# Patient Record
Sex: Male | Born: 1935 | Race: White | Hispanic: No | Smoking: Never smoker
Health system: Southern US, Community
[De-identification: ages and names within clinical notes are randomized; demographics above are authoritative.]

## PROBLEM LIST (undated history)

## (undated) DIAGNOSIS — K219 Gastro-esophageal reflux disease without esophagitis: Secondary | ICD-10-CM

## (undated) DIAGNOSIS — I440 Atrioventricular block, first degree: Secondary | ICD-10-CM

## (undated) DIAGNOSIS — I739 Peripheral vascular disease, unspecified: Secondary | ICD-10-CM

## (undated) DIAGNOSIS — I25709 Atherosclerosis of coronary artery bypass graft(s), unspecified, with unspecified angina pectoris: Secondary | ICD-10-CM

## (undated) DIAGNOSIS — I639 Cerebral infarction, unspecified: Secondary | ICD-10-CM

## (undated) DIAGNOSIS — I2510 Atherosclerotic heart disease of native coronary artery without angina pectoris: Secondary | ICD-10-CM

## (undated) DIAGNOSIS — M1990 Unspecified osteoarthritis, unspecified site: Secondary | ICD-10-CM

## (undated) DIAGNOSIS — I472 Ventricular tachycardia: Secondary | ICD-10-CM

## (undated) DIAGNOSIS — I4729 Other ventricular tachycardia: Secondary | ICD-10-CM

## (undated) DIAGNOSIS — Z955 Presence of coronary angioplasty implant and graft: Secondary | ICD-10-CM

## (undated) DIAGNOSIS — E785 Hyperlipidemia, unspecified: Secondary | ICD-10-CM

## (undated) DIAGNOSIS — I219 Acute myocardial infarction, unspecified: Secondary | ICD-10-CM

## (undated) DIAGNOSIS — I779 Disorder of arteries and arterioles, unspecified: Secondary | ICD-10-CM

## (undated) DIAGNOSIS — I10 Essential (primary) hypertension: Secondary | ICD-10-CM

## (undated) DIAGNOSIS — E039 Hypothyroidism, unspecified: Secondary | ICD-10-CM

## (undated) HISTORY — PX: CATARACT EXTRACTION W/ INTRAOCULAR LENS IMPLANT: SHX1309

## (undated) HISTORY — PX: HERNIA REPAIR: SHX51

## (undated) HISTORY — DX: Cerebral infarction, unspecified: I63.9

## (undated) HISTORY — DX: Atrioventricular block, first degree: I44.0

## (undated) HISTORY — DX: Gastro-esophageal reflux disease without esophagitis: K21.9

## (undated) HISTORY — PX: CARDIAC CATHETERIZATION: SHX172

## (undated) HISTORY — PX: TONSILLECTOMY: SUR1361

## (undated) HISTORY — DX: Ventricular tachycardia: I47.2

## (undated) HISTORY — DX: Other ventricular tachycardia: I47.29

---

## 1998-04-29 ENCOUNTER — Ambulatory Visit (HOSPITAL_COMMUNITY): Admission: RE | Admit: 1998-04-29 | Discharge: 1998-04-29 | Payer: Self-pay | Admitting: *Deleted

## 1999-05-30 ENCOUNTER — Ambulatory Visit (HOSPITAL_COMMUNITY): Admission: RE | Admit: 1999-05-30 | Discharge: 1999-05-30 | Payer: Self-pay | Admitting: *Deleted

## 2004-11-28 ENCOUNTER — Ambulatory Visit: Payer: Self-pay | Admitting: Cardiology

## 2004-12-13 ENCOUNTER — Ambulatory Visit: Payer: Self-pay

## 2005-12-24 ENCOUNTER — Ambulatory Visit: Payer: Self-pay

## 2005-12-24 ENCOUNTER — Ambulatory Visit: Payer: Self-pay | Admitting: Cardiology

## 2006-01-02 ENCOUNTER — Ambulatory Visit: Payer: Self-pay

## 2006-07-03 ENCOUNTER — Ambulatory Visit: Payer: Self-pay

## 2006-11-19 ENCOUNTER — Ambulatory Visit: Payer: Self-pay | Admitting: Cardiology

## 2006-11-19 ENCOUNTER — Inpatient Hospital Stay (HOSPITAL_COMMUNITY): Admission: EM | Admit: 2006-11-19 | Discharge: 2006-11-22 | Payer: Self-pay | Admitting: Emergency Medicine

## 2006-11-21 ENCOUNTER — Ambulatory Visit: Payer: Self-pay | Admitting: Vascular Surgery

## 2006-11-28 ENCOUNTER — Inpatient Hospital Stay (HOSPITAL_COMMUNITY): Admission: RE | Admit: 2006-11-28 | Discharge: 2006-11-29 | Payer: Self-pay | Admitting: Vascular Surgery

## 2006-11-28 ENCOUNTER — Encounter (INDEPENDENT_AMBULATORY_CARE_PROVIDER_SITE_OTHER): Payer: Self-pay | Admitting: Specialist

## 2006-11-28 HISTORY — PX: CAROTID ENDARTERECTOMY: SUR193

## 2006-12-20 ENCOUNTER — Ambulatory Visit: Payer: Self-pay | Admitting: Cardiology

## 2006-12-23 ENCOUNTER — Ambulatory Visit: Payer: Self-pay | Admitting: Vascular Surgery

## 2007-06-26 ENCOUNTER — Ambulatory Visit: Payer: Self-pay | Admitting: Cardiology

## 2007-07-04 ENCOUNTER — Ambulatory Visit: Payer: Self-pay | Admitting: Vascular Surgery

## 2008-05-05 ENCOUNTER — Ambulatory Visit: Payer: Self-pay | Admitting: Cardiology

## 2008-05-12 ENCOUNTER — Ambulatory Visit: Payer: Self-pay | Admitting: Cardiology

## 2008-05-12 ENCOUNTER — Ambulatory Visit: Payer: Self-pay

## 2008-05-12 LAB — CONVERTED CEMR LAB
BUN: 20 mg/dL (ref 6–23)
CO2: 28 meq/L (ref 19–32)
Calcium: 9.5 mg/dL (ref 8.4–10.5)
Eosinophils Relative: 1 % (ref 0.0–5.0)
GFR calc Af Amer: 105 mL/min
Glucose, Bld: 80 mg/dL (ref 70–99)
HCT: 39.1 % (ref 39.0–52.0)
Hemoglobin: 13.6 g/dL (ref 13.0–17.0)
INR: 1 (ref 0.8–1.0)
Lymphocytes Relative: 23.8 % (ref 12.0–46.0)
Monocytes Absolute: 0.7 10*3/uL (ref 0.1–1.0)
Monocytes Relative: 11.8 % (ref 3.0–12.0)
Neutro Abs: 3.9 10*3/uL (ref 1.4–7.7)
Potassium: 4.7 meq/L (ref 3.5–5.1)
WBC: 6.2 10*3/uL (ref 4.5–10.5)

## 2008-05-14 ENCOUNTER — Ambulatory Visit: Payer: Self-pay | Admitting: Cardiology

## 2008-05-14 ENCOUNTER — Inpatient Hospital Stay (HOSPITAL_BASED_OUTPATIENT_CLINIC_OR_DEPARTMENT_OTHER): Admission: RE | Admit: 2008-05-14 | Discharge: 2008-05-14 | Payer: Self-pay | Admitting: Cardiology

## 2008-06-03 ENCOUNTER — Ambulatory Visit: Payer: Self-pay | Admitting: Cardiology

## 2008-06-30 ENCOUNTER — Ambulatory Visit: Payer: Self-pay | Admitting: Cardiology

## 2008-07-14 ENCOUNTER — Ambulatory Visit: Payer: Self-pay | Admitting: Vascular Surgery

## 2009-01-06 DIAGNOSIS — I440 Atrioventricular block, first degree: Secondary | ICD-10-CM | POA: Insufficient documentation

## 2009-01-06 DIAGNOSIS — I10 Essential (primary) hypertension: Secondary | ICD-10-CM

## 2009-01-06 DIAGNOSIS — I4949 Other premature depolarization: Secondary | ICD-10-CM

## 2009-01-06 DIAGNOSIS — I441 Atrioventricular block, second degree: Secondary | ICD-10-CM | POA: Insufficient documentation

## 2009-03-22 ENCOUNTER — Encounter (INDEPENDENT_AMBULATORY_CARE_PROVIDER_SITE_OTHER): Payer: Self-pay | Admitting: *Deleted

## 2009-04-24 ENCOUNTER — Encounter (INDEPENDENT_AMBULATORY_CARE_PROVIDER_SITE_OTHER): Payer: Self-pay | Admitting: *Deleted

## 2009-04-26 ENCOUNTER — Telehealth: Payer: Self-pay | Admitting: Cardiology

## 2009-04-27 ENCOUNTER — Telehealth (INDEPENDENT_AMBULATORY_CARE_PROVIDER_SITE_OTHER): Payer: Self-pay | Admitting: *Deleted

## 2009-04-28 ENCOUNTER — Encounter: Payer: Self-pay | Admitting: Cardiology

## 2009-04-29 ENCOUNTER — Ambulatory Visit: Payer: Self-pay | Admitting: Cardiology

## 2009-04-29 DIAGNOSIS — R0989 Other specified symptoms and signs involving the circulatory and respiratory systems: Secondary | ICD-10-CM | POA: Insufficient documentation

## 2009-04-29 DIAGNOSIS — I2510 Atherosclerotic heart disease of native coronary artery without angina pectoris: Secondary | ICD-10-CM

## 2009-04-29 DIAGNOSIS — I252 Old myocardial infarction: Secondary | ICD-10-CM

## 2009-07-22 ENCOUNTER — Ambulatory Visit: Payer: Self-pay | Admitting: Vascular Surgery

## 2010-05-02 ENCOUNTER — Telehealth: Payer: Self-pay | Admitting: Cardiology

## 2010-05-17 ENCOUNTER — Ambulatory Visit: Payer: Self-pay | Admitting: Cardiology

## 2010-05-17 DIAGNOSIS — E785 Hyperlipidemia, unspecified: Secondary | ICD-10-CM | POA: Insufficient documentation

## 2010-05-24 ENCOUNTER — Ambulatory Visit: Payer: Self-pay | Admitting: Cardiology

## 2010-05-31 LAB — CONVERTED CEMR LAB
AST: 27 units/L (ref 0–37)
Albumin: 3.9 g/dL (ref 3.5–5.2)
Alkaline Phosphatase: 57 units/L (ref 39–117)
BUN: 23 mg/dL (ref 6–23)
CO2: 28 meq/L (ref 19–32)
Cholesterol: 139 mg/dL (ref 0–200)
GFR calc non Af Amer: 73.74 mL/min (ref >60)
Glucose, Bld: 86 mg/dL (ref 70–99)
Potassium: 4.4 meq/L (ref 3.5–5.1)
Total Protein: 6.4 g/dL (ref 6.0–8.3)
Triglycerides: 87 mg/dL (ref 0.0–149.0)

## 2010-06-04 ENCOUNTER — Encounter: Payer: Self-pay | Admitting: Cardiology

## 2010-08-01 ENCOUNTER — Ambulatory Visit: Payer: Self-pay | Admitting: Vascular Surgery

## 2010-09-21 NOTE — Assessment & Plan Note (Signed)
Summary: PER CHECK OUT/SAF  Medications Added VITAMIN D 1000 UNIT TABS (CHOLECALCIFEROL) 1 tab once daily COLCRYS 0.6 MG TABS (COLCHICINE) 1 tab once daily VITAMIN E 100 UNIT CAPS (VITAMIN E) 1 cap once daily NITROSTAT 0.4 MG SUBL (NITROGLYCERIN) 1 tablet under tongue at onset of chest pain; you may repeat every 5 minutes for up to 3 doses.      Allergies Added: NKDA  Visit Type:  1 yr f/u Primary Provider:  [facility] on [address]  CC:  no cardiac complaints today.Marland KitchenMarland Kitchenpt weight is down 9 lb since 04/2009.  History of Present Illness: [name] returns today for evaluation and management of his coronary artery disease, first AV block, hyperlipidemia, and carotid artery disease.  He is having no symptoms of angina or ischemia. He denies any symptoms of TIAs or mini strokes. He is to get carotid Dopplers at the vascular office next month. He is overdue blood work. He currently does not have a primary care physician.  He denies orthopnea, PND and has stable edema. He denies palpitations or syncope.  Current Medications (verified): 1)  Simvastatin 20 Mg Tabs (Simvastatin) .Marland Kitchen.. 1 Tab At Bedtime 2)  Lisinopril 40 Mg Tabs (Lisinopril) .Marland Kitchen.. 1 Tab Once Daily 3)  Metoprolol Succinate 50 Mg Xr24h-Tab (Metoprolol Succinate) .Marland Kitchen.. 1 Tab Once Daily 4)  Hydrochlorothiazide 12.5 Mg Caps (Hydrochlorothiazide) .Marland Kitchen.. 1 Cap Once Daily 5)  Amlodipine Besylate 5 Mg Tabs (Amlodipine Besylate) .Marland Kitchen.. 1 Tab Once Daily 6)  Vitamin C 500 Mg Tabs (Ascorbic Acid) .Marland Kitchen.. 1 Tab Once Daily 7)  Multivitamins   Tabs (Multiple Vitamin) .Marland Kitchen.. 1 Tab Once Daily 8)  Vitamin D 1000 Unit Tabs (Cholecalciferol) .Marland Kitchen.. 1 Tab Once Daily 9)  Aspirin Ec 325 Mg Tbec (Aspirin) .... Take One Tablet By Mouth Daily 10)  Coenzyme Q10 100 Mg Caps (Coenzyme Q10) .Marland Kitchen.. 1 Cap Once Daily 11)  Glucosamine-Chondroitin-Msm [phone] Mg Tabs (Glucosa-Chondr-Na Chondr-Msm) .Marland Kitchen.. 1 Tab Once Daily 12)  Fish Oil 1000 Mg Caps (Omega-3 Fatty Acids)  .Marland Kitchen.. 1 Cap Once Daily 13)  Colcrys 0.6 Mg Tabs (Colchicine) .Marland Kitchen.. 1 Tab Once Daily 14)  Vitamin E 100 Unit Caps (Vitamin E) .Marland Kitchen.. 1 Cap Once Daily 15)  Nitrostat 0.4 Mg Subl (Nitroglycerin) .Marland Kitchen.. 1 Tablet Under Tongue At Onset of Chest Pain; You May Repeat Every 5 Minutes For Up To 3 Doses.  Allergies (verified): No Known Drug Allergies  Past History:  Past Medical History: Last updated: 01/06/2009 AV BLOCK, 1ST DEGREE (ICD-426.11) PREMATURE VENTRICULAR CONTRACTIONS (ICD-427.69) HYPERTENSION, UNSPECIFIED (ICD-401.9)    Past Surgical History: Last updated: 01/06/2009 left hernia repair in 1982 right  repair in 1992 tonsillectomy  Family History: Last updated: 01/06/2009 Family History of Cancer:  Family History of Diabetes:   Social History: Last updated: 01/06/2009 Retired  Tobacco Use - No.  Alcohol Use - yes  Risk Factors: Smoking Status: never (01/06/2009)  Review of Systems       negative other than history of present illness  Vital Signs:  Patient profile:   77 year old male Height:      68 inches Weight:      175.12 pounds BMI:     26.72 Pulse rate:   57 / minute Pulse rhythm:   irregular BP sitting:   124 / 70  (left arm) Cuff size:   large  Vitals Entered By: Danielle Rankin, CMA (May 17, 2010 4:25 PM)  Physical Exam  General:  no acute distress, overweight,unkept.   Head:  normocephalic and  atraumatic Eyes:  PERRLA/EOM intact; conjunctiva and lids normal. Neck:  Neck supple, no JVD. No masses, thyromegaly or abnormal cervical nodes. Chest Wall:  no deformities or breast masses noted Lungs:  Clear bilaterally to auscultation and percussion. Heart:  PMI nondisplaced, regular rate and rhythm, normal S1-S2, soft left carotid bruit Msk:  decreased ROM.   Pulses:  pulses normal in all 4 extremities Extremities:  trace left pedal edema and trace right pedal edema.   Neurologic:  Alert and oriented x 3. Skin:  Intact without lesions or  rashes. Psych:  Normal affect.   Problems:  Medical Problems Added: 1)  Dx of Hyperlipidemia-mixed  (ICD-272.4)  EKG  Procedure date:  05/17/2010  Findings:      sinus bradycardia, first-degree A-V block no change  Impression & Recommendations:  Problem # 1:  CAD, NATIVE VESSEL (ICD-414.01) Assessment Unchanged Continue medical therapy His updated medication list for this problem includes:    Lisinopril 40 Mg Tabs (Lisinopril) .Marland Kitchen... 1 tab once daily    Metoprolol Succinate 50 Mg Xr24h-tab (Metoprolol succinate) .Marland Kitchen... 1 tab once daily    Amlodipine Besylate 5 Mg Tabs (Amlodipine besylate) .Marland Kitchen... 1 tab once daily    Aspirin Ec 325 Mg Tbec (Aspirin) .Marland Kitchen... Take one tablet by mouth daily    Nitrostat 0.4 Mg Subl (Nitroglycerin) .Marland Kitchen... 1 tablet under tongue at onset of chest pain; you may repeat every 5 minutes for up to 3 doses.  Orders: EKG w/ Interpretation (93000)  Problem # 2:  CAROTID ARTERY STENOSIS, WITHOUT INFARCTION (ICD-433.10) He we'll obtain carotid Dopplers next month at the vascular surgery office. No change in treatment. His updated medication list for this problem includes:    Aspirin Ec 325 Mg Tbec (Aspirin) .Marland Kitchen... Take one tablet by mouth daily  Problem # 3:  OLD MYOCARDIAL INFARCTION (ICD-412)  His updated medication list for this problem includes:    Lisinopril 40 Mg Tabs (Lisinopril) .Marland Kitchen... 1 tab once daily    Metoprolol Succinate 50 Mg Xr24h-tab (Metoprolol succinate) .Marland Kitchen... 1 tab once daily    Amlodipine Besylate 5 Mg Tabs (Amlodipine besylate) .Marland Kitchen... 1 tab once daily    Aspirin Ec 325 Mg Tbec (Aspirin) .Marland Kitchen... Take one tablet by mouth daily    Nitrostat 0.4 Mg Subl (Nitroglycerin) .Marland Kitchen... 1 tablet under tongue at onset of chest pain; you may repeat every 5 minutes for up to 3 doses.  Problem # 4:  AV BLOCK, 1ST DEGREE (ICD-426.11) Assessment: Unchanged  His updated medication list for this problem includes:    Lisinopril 40 Mg Tabs (Lisinopril) .Marland Kitchen... 1 tab  once daily    Metoprolol Succinate 50 Mg Xr24h-tab (Metoprolol succinate) .Marland Kitchen... 1 tab once daily    Amlodipine Besylate 5 Mg Tabs (Amlodipine besylate) .Marland Kitchen... 1 tab once daily    Aspirin Ec 325 Mg Tbec (Aspirin) .Marland Kitchen... Take one tablet by mouth daily    Nitrostat 0.4 Mg Subl (Nitroglycerin) .Marland Kitchen... 1 tablet under tongue at onset of chest pain; you may repeat every 5 minutes for up to 3 doses.  Problem # 5:  PREMATURE VENTRICULAR CONTRACTIONS (ICD-427.69)  His updated medication list for this problem includes:    Lisinopril 40 Mg Tabs (Lisinopril) .Marland Kitchen... 1 tab once daily    Metoprolol Succinate 50 Mg Xr24h-tab (Metoprolol succinate) .Marland Kitchen... 1 tab once daily    Amlodipine Besylate 5 Mg Tabs (Amlodipine besylate) .Marland Kitchen... 1 tab once daily    Aspirin Ec 325 Mg Tbec (Aspirin) .Marland Kitchen... Take one tablet by mouth daily  Nitrostat 0.4 Mg Subl (Nitroglycerin) .Marland Kitchen... 1 tablet under tongue at onset of chest pain; you may repeat every 5 minutes for up to 3 doses.  Problem # 6:  HYPERTENSION, UNSPECIFIED (ICD-401.9) Assessment: Improved  His updated medication list for this problem includes:    Lisinopril 40 Mg Tabs (Lisinopril) .Marland Kitchen... 1 tab once daily    Metoprolol Succinate 50 Mg Xr24h-tab (Metoprolol succinate) .Marland Kitchen... 1 tab once daily    Hydrochlorothiazide 12.5 Mg Caps (Hydrochlorothiazide) .Marland Kitchen... 1 cap once daily    Amlodipine Besylate 5 Mg Tabs (Amlodipine besylate) .Marland Kitchen... 1 tab once daily    Aspirin Ec 325 Mg Tbec (Aspirin) .Marland Kitchen... Take one tablet by mouth daily  Problem # 7:  HYPERLIPIDEMIA-MIXED (ICD-272.4) Will obtain fasting lipids and a comprehensive metabolic panel. His updated medication list for this problem includes:    Simvastatin 20 Mg Tabs (Simvastatin) .Marland Kitchen... 1 tab at bedtime  Clinical Reports Reviewed:  Carotid Doppler:  07/04/2007:  INDICATION:  Followup left carotid endarterectomy.      HISTORY:   Diabetes:  No   Cardiac:  No   Hypertension:  Yes   Smoking:  No                                           RIGHT             LEFT   Brachial systolic pressure:         120               120   Brachial Doppler waveforms:         Biphasic          Biphasic   Vertebral direction of flow:        Antegrade         Antegrade   DUPLEX VELOCITIES (cm/sec)   CCA peak systolic                   100               80   ECA peak systolic                   218               158   ICA peak systolic                   97                60   ICA end diastolic                   25                8   PLAQUE MORPHOLOGY:                  heterogenous      None   PLAQUE AMOUNT:                      Mild              None   PLAQUE LOCATION:                    ICA of ECA        ECA      IMPRESSION:  20% to 39% stenosis noted in the right ICA.  Normal carotid   duplex noted in the left ICA.  Status post left carotid endarterectomy.   Antegrade bilateral vertebral arteries.      ___________________________________________   Larina Earthly, M.D.   07/03/2006:  Mild plaque in the right bulb and bifurcation. Moderate plaque in the LICA which represents an increase in previous exam RICA 0 - 39% LICA 60 - 79%  Nuclear Study:  05/12/2008:  Exercise capacity - Fair exercise capacity  Blood Pressure - Hypertensive BP response (188/71)  Clinical Symptoms - No chest pain. Mild dyspnea  ECG impression - Significant ST abnormalities consistent with ischemia. Early positive study in stage 1. Sustained narrow complex SVT with retrograde P in ST segment noted in recovery.  Overall impression -  Abnormal stress nuclear study as detailed above patient seen in office same day to review by Dr Daleen Squibb  01/02/2006:  Exercise capacity - Good exercise capacity  Blood Pressure - Hypertensive BP response  Clinical Symptoms - No chest pain or dyspnea  ECG impression - No diagnostic ST changes to suggest ischemia by standard criteria  Overall impression -  Normal stress nuclear study   Patient Instructions: 1)  Your  physician recommends that you schedule a follow-up appointment in: 1 year with Dr. Daleen Squibb 2)  Your physician recommends that you return for a FASTING lipid profile, cmp  Prescriptions: NITROSTAT 0.4 MG SUBL (NITROGLYCERIN) 1 tablet under tongue at onset of chest pain; you may repeat every 5 minutes for up to 3 doses.  #25 x 7   Entered by:   Danielle Rankin, CMA   Authorized by:   Gaylord Shih, MD, [facility]   Signed by:   Danielle Rankin, CMA on 05/17/2010   Method used:   Electronically to        [id number] (retail)       [address]       [place], [place]  [address]       Ph: [phone] or [phone]       Fax: [phone]   RxID:   [id number]

## 2010-09-21 NOTE — Letter (Signed)
Summary: [facility] - Screening Questionaire  [facility] - Screening Question[facility]   Imported By: Marylou Mccoy 06/20/2010 15:30:07  _____________________________________________________________________  External Attachment:    Type:   Image     Comment:   External Document

## 2010-09-21 NOTE — Progress Notes (Signed)
Summary: RX'S SENT TO [facility] TODAY   Phone Note Refill Request Message from:  Patient on May 02, 2010 11:01 AM  Refills Requested: Medication #1:  HYDROCHLOROTHIAZIDE 12.5 MG CAPS 1 cap once daily  Medication #2:  AMLODIPINE BESYLATE 5 MG TABS 1 tab once daily  Medication #3:  METOPROLOL SUCCINATE 50 MG XR24H-TAB 1 tab once daily  Medication #4:  LISINOPRIL 40 MG TABS 1 tab once daily SIMVASTATIN 20MG  FAX TO [facility] [phone]  Initial call taken by: Judie Grieve,  May 02, 2010 11:02 AM    Prescriptions: AMLODIPINE BESYLATE 5 MG TABS (AMLODIPINE BESYLATE) 1 tab once daily  #90 x 3   Entered by:   Danielle Rankin, CMA   Authorized by:   Gaylord Shih, MD, [facility]   Signed by:   Danielle Rankin, CMA on 05/02/2010   Method used:   Faxed to ...       [facility] education officer)       [address] [address]       [place], [place]  [address]       Ph: [phone]       Fax: [phone]   RxID:   [id number] HYDROCHLOROTHIAZIDE 12.5 MG CAPS (HYDROCHLOROTHIAZIDE) 1 cap once daily  #90 x 3   Entered by:   Danielle Rankin, CMA   Authorized by:   Gaylord Shih, MD, [facility]   Signed by:   Danielle Rankin, CMA on 05/02/2010   Method used:   Faxed to ...       [facility] Environmental education officer)       [address] [address]       [place], [place]  [address]       Ph: [phone]       Fax: [phone]   RxID:   [id number] METOPROLOL SUCCINATE 50 MG XR24H-TAB (METOPROLOL SUCCINATE) 1 tab once daily  #90 x 3   Entered by:   Danielle Rankin, CMA   Authorized by:   Gaylord Shih, MD, [facility]   Signed by:   Danielle Rankin, CMA on 05/02/2010   Method used:   Faxed to ...       [facility] Environmental education officer)       [address] [address]       [place], [place]  [address]       Ph: [phone]       Fax: [phone]   RxID:   [id number] LISINOPRIL 40 MG TABS (LISINOPRIL) 1 tab once daily  #90 x 3   Entered by:   Danielle Rankin, CMA   Authorized by:   Gaylord Shih, MD, [facility]   Signed by:   Danielle Rankin, CMA on  05/02/2010   Method used:   Faxed to ...       [facility] Environmental education officer)       [address] [address]       [place], [place]  [address]       Ph: [phone]       Fax: [phone]   RxID:   [id number] SIMVASTATIN 20 MG TABS (SIMVASTATIN) 1 tab at bedtime  #90 x 3   Entered by:   Danielle Rankin, CMA   Authorized by:   Gaylord Shih, MD, [facility]   Signed by:   Danielle Rankin, CMA on 05/02/2010   Method used:   Faxed to ...       [facility] Environmental education officer)       [address] [address]       [place], [place]  [address]  Ph: [phone]       Fax: [phone]   RxID:   [id number]

## 2011-01-02 NOTE — Assessment & Plan Note (Signed)
OFFICE VISIT   [name], [name]  DOB:  May 23, 1928                                       07/04/2007  KGMWN#:[id number]   The patient presents today for followup of his left carotid  endarterectomy for severe symptomatic left internal carotid artery  stenosis on 11/28/2006.  He continues to do well following his surgery  and has had no new neurologic deficits.  He also denies any new major  medical difficulties.   PHYSICAL EXAM:  He is a well-developed, well-nourished white male  appearing his stated age of 29.  Blood pressure is 135/81, pulse 61,  respirations 18.  His radial pulses are 2+ bilaterally.  He is grossly  intact neurologically.  His carotid incision is well healed without  bruits bilaterally.  He underwent carotid duplex evaluation today and  this reveals no significant stenosis of right or left carotid systems.  I reassured and reviewed this with the patient.  We will see him again  on a yearly basis for followup duplex to rule out any recurrence or  progression.   Larina Earthly, M.D.  Electronically Signed   TFE/MEDQ  D:  07/04/2007  T:  07/07/2007  Job:  [id number]

## 2011-01-02 NOTE — Procedures (Signed)
CAROTID DUPLEX EXAM   INDICATION:  Followup left carotid endarterectomy.   HISTORY:  Diabetes:  No  Cardiac:  No  Hypertension:  Yes  Smoking:  No                                       RIGHT             LEFT  Brachial systolic pressure:         120               120  Brachial Doppler waveforms:         Biphasic          Biphasic  Vertebral direction of flow:        Antegrade         Antegrade  DUPLEX VELOCITIES (cm/sec)  CCA peak systolic                   100               80  ECA peak systolic                   218               158  ICA peak systolic                   97                60  ICA end diastolic                   25                8  PLAQUE MORPHOLOGY:                  heterogenous      None  PLAQUE AMOUNT:                      Mild              None  PLAQUE LOCATION:                    ICA of ECA        ECA   IMPRESSION:  20% to 39% stenosis noted in the right ICA.  Normal carotid  duplex noted in the left ICA.  Status post left carotid endarterectomy.  Antegrade bilateral vertebral arteries.   ___________________________________________  Larina Earthly, M.D.   MG/MEDQ  D:  07/04/2007  T:  07/05/2007  Job:  [id number]

## 2011-01-02 NOTE — Assessment & Plan Note (Signed)
[name]                            CARDIOLOGY OFFICE NOTE   [name], [name]                           MRN:          [id number]  DATE:05/12/2008                            DOB:          12/12/1927    CHIEF COMPLAINT:  I had an abnormal stress test.   HISTORY OF PRESENT ILLNESS:  [name] is a 77 year old gentleman  who I have been following for number of years.  He had a stress Myoview  today, which showed significant ST-segment changes in 5 minutes into  exercise.  These were seen in II, III, aVF, V3 through V6, and stage I.  He also had a lot of ectopy.  Then, he went into a complex SVT with  retrograde T and ST segment.  These were interpreted by Dr. Diona Ball.  His scan demonstrated a moderately decreased activity in inferior apical  wall with minor areas of decreased activity in lateral apex.  He had an  EF of 62%.   He is not having any angina.  He has no ischemic equivalents that I can  gather.  His functional level has stayed unchanged from prior visits.   PAST MEDICAL HISTORY:  Significant for an inferior lateral wall infarct  in 1993.  At that time, he had a totally occluded obtuse marginal branch  by cath at [facility].  He had an EF of 64% on his last stress  Myoview.  No evidence of ischemia in 2007.   PAST MEDICAL HISTORY:  1. History of cerebrovascular disease, status post left carotid      endarterectomy by Dr. Gretta Ball.  This has been stable.  2. Hypertension.  3. Hyperlipidemia.  This is followed by Dr. Bradd Ball.   CURRENT MEDICATIONS:  1. Simvastatin 20 mg a day.  2. Lisinopril 40 mg a day.  3. Microzide 12.5 mg a day.  4. Metoprolol succinate 50 mg per day.  5. Multivitamin.  6. Vitamin E 400 units a day.  7. Glucosamine.   ALLERGIES:  He has no known drug allergies.   FAMILY HISTORY:  Please refer to previous notes in the chart and  evaluations.   SOCIAL HISTORY:  Please refer to previous  evaluations in the chart.   REVIEW OF SYSTEMS:  Other than his HPI, is negative.   PHYSICAL EXAMINATION:  VITAL SIGNS:  His blood pressure today shows  blood pressure 161/90, his pulse is 80 and regular, and his weight is  181 and stable.  GENERAL:  He is in no acute distress.  Alert and oriented x3, well-  developed and well nourished, and slightly overweight.  SKIN:  Warm and dry.  Affect is normal.  HEENT:  Normal.  NECK:  Carotid upstrokes are equal bilaterally without bruits.  No JVD.  Thyroid is not enlarged.  Trachea is midline.  LUNGS:  Clear to auscultation.  HEART:  Regular rate and rhythm.  No gallop.  ABDOMEN:  Soft.  Good bowel sounds.  No midline bruit.  No hepatomegaly.  EXTREMITIES:  No cyanosis, clubbing, or edema.  Pulses  are 2+ and 4+  both dorsalis pedis and posterior tibial.  NEUROLOGIC:  Intact.  SKIN:  Unremarkable.   ASSESSMENT AND PLAN:  Mr. Bonnin has asymptomatic coronary ischemia based  on a stress Myoview.  I am concerned about progressive disease,  particularly in his right coronary artery, which showed nonobstructive  plaque in 1993.   PLAN:  1. Outpatient cardiac catheterization.  2. Indications, risks, and potential benefits have been discussed.  He      understands the procedure and agreed to proceed.  He has had no dye      allergy in the past.  3. Continue current medications.     Bradley C. Daleen Squibb, MD, [facility]  Electronically Signed    TCW/MedQ  DD: 05/12/2008  DT: 05/13/2008  Job #: [id number]   cc:   [name], M.D.

## 2011-01-02 NOTE — Procedures (Signed)
CAROTID DUPLEX EXAM   INDICATION:  Followup evaluation known coronary artery disease.   HISTORY:  Diabetes:  No.  Cardiac:  Recent cath was negative for coronary artery disease according  to the patient.  Hypertension:  Yes.  Smoking:  No.  Previous Surgery:  Left carotid endarterectomy with Dacron patch  angioplasty on 11/28/2006 by Dr. Hart Rochester.  CV History:  The patient reports no cerebrovascular symptoms at this  time.  Previous duplex revealed 20-39% right ICA stenosis and no left  ICA stenosis status post endarterectomy on 07/04/2007.  Amaurosis Fugax No, Paresthesias No, Hemiparesis No                                       RIGHT             LEFT  Brachial systolic pressure:         130               138  Brachial Doppler waveforms:         Triphasic         Triphasic  Vertebral direction of flow:        Antegrade         Antegrade  DUPLEX VELOCITIES (cm/sec)  CCA peak systolic                   62                67  ECA peak systolic                   290               115  ICA peak systolic                   62                73  ICA end diastolic                   17                13  PLAQUE MORPHOLOGY:                  Soft              Soft  PLAQUE AMOUNT:                      Mild              Mild  PLAQUE LOCATION:                    Proximal ICA      Proximal ICA   IMPRESSION:  1. 20-39% right ICA stenosis.  2. 20-39% left ICA stenosis status post endarterectomy.  3. Right ECA stenosis.  4. No significant change from previous study.   ___________________________________________  Larina Earthly, M.D.   MC/MEDQ  D:  07/14/2008  T:  07/14/2008  Job:  [id number]

## 2011-01-02 NOTE — Assessment & Plan Note (Signed)
OFFICE VISIT   Bradley, Ball  DOB:  1928-05-24                                       08/01/2010  WGNFA#:[id number]   Patient presents today for follow-up of his extracranial cerebrovascular  occlusive disease.  He is well-known to me from a left carotid  arthrectomy in April 2008 with severe symptomatic left internal carotid  artery stenosis.  He has had no carotid symptoms since his surgery.  He  is seen today with a repeat carotid duplex follow-up.   He reports no new medical difficulties.  Does have a prior history of  myocardial infarction in 1993.  Does have a history of hypertension.  Has a history of prior heart catheterizations and hernia repair.   SOCIAL HISTORY:  He is married with 4 children.  He does not smoke and  does have 3 glasses of wine daily.   FAMILY HISTORY:  Negative for premature atherosclerotic disease.   REVIEW OF SYSTEMS:  Positive for no weight loss or gain.  He weighs 175  pounds.  He is 5 feet 8 inches tall.  VASCULAR:  Positive for prior TIA.  CARDIAC:  Prior MI.  GI:  For reflux.  NEUROLOGIC:  Prior TIA.  PULMONARY/HEMATOLOGIC/URINARY/ENT:  Normal.  PSYCHIATRIC/SKIN:  Negative.  MUSCULOSKELETAL:  Positive for joint pain.   PHYSICAL EXAMINATION:  A well-developed and well-nourished white male  appearing stated age of 77.  Blood pressure is 157/96 in his right arm  and 147/88 in his left arm, heart rate 63, respirations 20.  He is in no  acute distress.  HEENT is normal.  Chest:  Clear bilaterally.  Heart:  Regular rate and rhythm.  Carotids are noted for a soft right carotid  bruit.  No bruit on the left with a well-healed left neck incision.  Heart:  Regular rate and rhythm.  Musculoskeletal:  No major deformities  or cyanosis.  Neurologic:  No focal weakness or paresthesias.  Skin  without ulcers or rashes.   He did undergo repeat carotid duplex today in our office, and I have  reviewed this with patient.  This shows  no evidence of significant  stenoses bilaterally.  He does have a narrowing in his right external  carotid.  I explained that this is a source of his bruit but is of no  clinical significance.  We will continue to see him on a yearly basis to  rule out any progression in his stenosis.     Larina Earthly, M.D.  Electronically Signed   TFE/MEDQ  D:  08/01/2010  T:  08/02/2010  Job:  [id number]   cc:   Thomas C. Wall, MD, [facility]

## 2011-01-02 NOTE — Procedures (Signed)
CAROTID DUPLEX EXAM   INDICATION:  Followup evaluation of known carotid disease.   HISTORY:  Diabetes:  No.  Cardiac:  No.  Hypertension:  Yes.  Smoking:  No.  Previous Surgery:  Left carotid endarterectomy on 11/28/2006.  CV History:  No.  Amaurosis Fugax No, Paresthesias No, Hemiparesis No                                       RIGHT             LEFT  Brachial systolic pressure:         138               140  Brachial Doppler waveforms:         Triphasic         Triphasic  Vertebral direction of flow:        Antegrade         Antegrade  DUPLEX VELOCITIES (cm/sec)  CCA peak systolic                   82                97  ECA peak systolic                   255               196  ICA peak systolic                   105               116  ICA end diastolic                   33                33  PLAQUE MORPHOLOGY:                  Heterogeneous     Homogeneous  PLAQUE AMOUNT:                      Mild              Mild  PLAQUE LOCATION:                    ICA, ECA          ECA, ICA   IMPRESSION:  1. Right internal carotid artery suggests 1%-39% stenosis.  2. Left internal carotid artery suggests 1%-39% stenosis status post      endarterectomy.  3. Bilateral external carotid arteries suggests stenosis.  4. Antegrade filling bilateral vertebrals.        ___________________________________________  Larina Earthly, M.D.   CB/MEDQ  D:  07/22/2009  T:  07/22/2009  Job:  [id number]

## 2011-01-02 NOTE — Assessment & Plan Note (Signed)
[facility]                            CARDIOLOGY OFFICE NOTE   SHAYLON, ADEN                           MRN:          [id number]  DATE:06/26/2007                            DOB:          1927-10-01    DATE OF VISIT:  June 26, 2007.   Mr. Olander returns today for further management of the following issues:  1)  Coronary artery disease, history of an inferior wall infarct in  1993, medical therapy.  His last stress Myoview was Jan 02, 2006, doing  well with an EF of 64% with no evidence of ischemia.  He is having no  angina at present; 2)  Cerebrovascular disease status post left carotid  endarterectomy by Dr. Gretta Began.  He has an appointment with him next  week.  He is having no recurrent ischemic symptoms; 3)  Hypertension; 4)  Hyperlipidemia.  Lipids from Dr. Blair Heys office are at goal.  His LFTs  were normal.  He brings a copy for me today.   MEDICATIONS:  1. Simvastatin 20 mg daily.  2. Lisinopril 40 mg daily.  3. Microzide 12.5 mg daily.  4. Metoprolol Succinate 50 mg daily.  5. Multivitamin and Vitamin E, Calcium, Mag, Zinc, and Glucosamine.   PHYSICAL EXAMINATION:  VITAL SIGNS:  His blood pressure today is 120/78,  his pulse is 58 and regular, his weight is 175.  HEENT:  Normocephalic, atraumatic, PERRLA, extraocular movements intact,  sclerae are clear, facial symmetry is normal.  NECK:  Supple.  He has a left carotid endarterectomy scar on the left  that is well healed.  He has soft, systolic bruits on both sides.  The thyroid is not  enlarged.  The trachea is midline.  LUNGS:  Clear.  HEART:  A nondisplaced PMI, normal S1 S2.  ABDOMEN:  Soft with good bowel sounds, no midline bruits.  EXTREMITIES:  No cyanosis, clubbing, or edema.  Pulse are intact 2+/4+  dorsalis pedis pulse and posterior tibial.  NEURO EXAM:  Intact.   EKG shows sinus brady with a first-degree AV block and occasional PVCs.   Mr. Lucarelli is doing well.  I  have asked him to continue his current  medical program.  He is going to see [name] next week, sure he will  Doppler the carotids.  I will see Mr. Goostree back in six months.  At that  time, he will need a stress Myoview.     Thomas C. Daleen Squibb, MD, [facility]  Electronically Signed    TCW/MedQ  DD: 06/26/2007  DT: 06/26/2007  Job #: [id number]   cc:   Quita Skye. Artis Flock, M.D.  Larina Earthly, M.D.

## 2011-01-02 NOTE — Assessment & Plan Note (Signed)
[name]                            CARDIOLOGY OFFICE NOTE   [name], [name]                           MRN:          [id number]  DATE:06/03/2008                            DOB:          [birth year]    [name] returns today for further management of his coronary disease.  Because of positive stress Myoview showing new inferior apical ischemia,  he underwent cardiac catheterization.   This showed diffuse coronary disease with a subtotally occluded LAD  apical lesion, diffuse disease, small first diagonal with subtotal  stenosis, heavily calcified circumflex.  It was occluded with bridging  collaterals filling the distal circumflex and obtuse marginal (but this  is old from his previous cath at [facility] years ago), nonobstructive  disease in a large ramus intermedius, dominant right coronary artery  with moderate calcification with 70-80% long mid stenosis, posterior  lateral that was small with luminal irregularities, 60% distal RCA  before the PDA.  His EF was 65%.  He was noted to be hypertensive during  his cath at 169/108.   He is really having no angina.  He says his blood pressure is under good  control.  However, he does not check it at home.  In fact, his machine  is out of batteries!   He is currently on simvastatin 20 mg nightly with good control of his  cholesterol per Dr. Penni Bombard, lisinopril 40 mg a day, Microzide 12.5 mg  per day, metoprolol succinate 50 mg q.a.m.   His blood pressure was 172/82.  I rechecked it and it was about the  same.  His pulse 72 and regular.  His weight is 181.  Rest of the exam  is unchanged.   I had a long talk with [name].  I have added amlodipine 5 mg p.o.  q.a.m.  We cannot go up any higher on his Toprol because of bradycardia.  He has maxed out on his lisinopril and it will not give any significant  benefit to go up on his Microzide.   I will have him return in about 4 weeks.  He will check  blood pressures  at home on his on cuff.  He will bring those with him.  Our goal will be  a blood pressure of 140 systolic or less.   We also talked about angina and ischemic equivalents and I gave him a  prescription for a sublingual nitroglycerin.     Bradley C. Daleen Squibb, MD, [facility]  Electronically Signed    TCW/MedQ  DD: 06/03/2008  DT: 06/04/2008  Job #: [id number]

## 2011-01-02 NOTE — Procedures (Signed)
CAROTID DUPLEX EXAM   INDICATION:  Follow up known carotid disease.   HISTORY:  Diabetes:  No.  Cardiac:  MI.  Hypertension:  Yes.  Smoking:  No.  Previous Surgery:  Left carotid endarterectomy on 11/28/2006.  CV History:  Asymptomatic.  Amaurosis Fugax No, Paresthesias No, Hemiparesis No.                                       RIGHT             LEFT  Brachial systolic pressure:         110               110  Brachial Doppler waveforms:         Normal            Normal  Vertebral direction of flow:        Antegrade         Antegrade  DUPLEX VELOCITIES (cm/sec)  CCA peak systolic                   104               75  ECA peak systolic                   328               207  ICA peak systolic                   69                82  ICA end diastolic                   17                21  PLAQUE MORPHOLOGY:                  Heterogenous      Homogeneous  PLAQUE AMOUNT:                      Mild              Mild  PLAQUE LOCATION:                    ICA, ECA          ICA, ECA   IMPRESSION:  1. Right Doppler velocities suggest 20% to 39% stenosis in the      internal carotid artery.  2. Left Doppler velocities suggest 20% to 39% stenosis in the internal      carotid artery, status post carotid endarterectomy.  3. Bilateral external carotid artery stenosis noted.  4. Antegrade flow noted in the bilateral vertebral arteries.  5. No significant changes from previous examinations.   ___________________________________________  Larina Earthly, M.D.   NT/MEDQ  D:  08/01/2010  T:  08/01/2010  Job:  [id number]

## 2011-01-02 NOTE — Assessment & Plan Note (Signed)
[facility]                            CARDIOLOGY OFFICE NOTE   [name], [name]                           MRN:          [id number]  DATE:05/05/2008                            DOB:          11-Nov-1927    [name] returns today for followup of his following issues:  1. Coronary artery disease.  He is having no angina or ischemic      symptoms.  He has had a previous inferior wall infarction in 1993.      He is due stress Myoview last being May 2007, EF 64% with no      evidence of ischemia.  2. Cerebrovascular disease status post TIA and status post left      carotid endarterectomy by Dr. Gretta Began.  Carotid Dopplers on      July 04, 2007 by Dr. Bosie Helper office showed 20-39% stenosis in      the right internal, no evidence of any stenosis on the left, and      antegrade flow in both vertebrals.  3. Hypertension.  4. Hyperlipidemia, followed by Dr. Artis Flock.   He denies any orthopnea, PND, or peripheral edema.  His activity level  is good.  He has had no presyncope, syncope, or tachypalpitations.   He has no known drug allergies.   CURRENT MEDICATIONS:  1. Simvastatin 20 mg a day.  2. Lisinopril 40 mg a day.  3. Microzide 12.5 mg a day.  4. Metoprolol succinate 50 mg daily.  5. Multivitamin.  6. Glucosamine.  7. Vitamin E.   PHYSICAL EXAMINATION:  GENERAL:  Today, he is in no acute distress.  Well-developed, well- nourished, slightly overweight male.  VITAL SIGNS:  His blood pressure is 126/52, his pulse is 64 and regular,  and his weight is 182, which is up 7.  HEENT:  Normal.  Carotid upstrokes are equal bilaterally with a soft  right carotid bruit.  Thyroid is not enlarged.  Trachea is midline.  He  has a left carotid endarterectomy scar.  NECK:  Supple.  LUNGS:  Clear to auscultation and percussion.  HEART:  Reveals a nondisplaced PMI.  Normal S1 and S2.  ABDOMEN:  Soft.  Good bowel sounds.  No midline bruit.  No hepatomegaly.  No  organomegaly.  EXTREMITIES:  No cyanosis, clubbing, or edema.  Pulses are intact.  NEURO:  Intact.  SKIN:  Unremarkable.   ASSESSMENT AND PLAN:  [name] is doing well.  We will schedule him for  an exercise rest stress Myoview.  He will have to be off metoprolol for  this study.  Assuming this is negative for ischemia, we will see him  back in a year.   All his meds were renewed today.     Thomas C. Daleen Squibb, MD, [facility]  Electronically Signed    TCW/MedQ  DD: 05/05/2008  DT: 05/05/2008  Job #: [id number]   cc:   Quita Skye. Artis Flock, M.D.

## 2011-01-02 NOTE — Cardiovascular Report (Signed)
NAME:  [name], [name] NO.:  [id number]   MEDICAL RECORD NO.:  [id number]          PATIENT TYPE:  OIB   LOCATION:  1963                         FACILITY:  [facility]   PHYSICIAN:  Rollene Rotunda, MD, FACCDATE OF BIRTH:  1928-07-03   DATE OF PROCEDURE:  DATE OF DISCHARGE:  05/14/2008                            CARDIAC CATHETERIZATION   PRIMARY CARE Achaia Garlock:  Quita Skye. Artis Flock, MD   CARDIOLOGIST:  Jesse Sans. Wall, MD, [facility]   PROCEDURE:  Left heart catheterization/coronary arteriography.   INDICATIONS:  Evaluate patient with known coronary disease having had a  catheterization at [facility] many years ago and being told he had a  blockage in his left circumflex.  He had a stress perfusion study  recently which demonstrated an EF of 62%.  There was apical inferior  ischemia as well as some lateral wall ischemia.   PROCEDURE NOTE:  Left heart catheterization was performed via the right  femoral artery.  The artery is cannulated using the anterior wall  puncture.  A #4-French arterial sheath was inserted via the modified  Seldinger technique.  Preformed Judkins and pigtail catheter were  utilized.  The patient tolerated the procedure well and left the lab in  stable condition.   RESULTS:  Hemodynamics, LV 163/26, Ao 169/108.  Coronaries, left main  had luminal irregularities.  The LAD was moderately to severely  calcified.  There was long proximal 50% stenosis.  There were focal mid  tandem 40% lesions.  There was diffuse 25% stenosis in the mid and  distal vessel.  The apex was subtotally stenosed.  It was a large vessel  wrapping the apex.  There is a small first diagonal which was branching  and diffusely diseased with subtotal stenosis.  The circumflex was  heavily calcified.  It was occluded after the ramus intermediate.  There  were some bridging collaterals with faint filling of the distal  circumflex and OM.  It appeared to be a large vessel.  The ramus  intermediate was a very large vessel.  There  were proximal tandem 30%  lesions.  There was mid long 40% stenosis.  There was a mid 70% stenosis  following this.  The right coronary artery was a dominant vessel.  It  was moderately calcified.  There was long mid 70-80% stenosis.  There  was distal 60% stenosis before the PDA.  Posterolateral had small  luminal irregularities.  The posterolateral was small with luminal  irregularities.  The PDA had long proximal 30-40% stenosis.  The EF was  65% and normal.   CONCLUSION:  1. Diffuse coronary plaque.  2. Preserved left ventricular function.   PLAN:  We will review the films.  The options of medical management  versus PCI of the right coronary and circumflex.  He needs aggressive  risk reduction.      Rollene Rotunda, MD, [facility]  Electronically Signed     JH/MEDQ  D:  05/14/2008  T:  05/15/2008  Job:  [id number]   cc:   Quita Skye. Artis Flock, M.D.

## 2011-01-02 NOTE — Assessment & Plan Note (Signed)
[facility]                            CARDIOLOGY OFFICE NOTE   [name]                           MRN:          [id number]  DATE:06/30/2008                            DOB:          1928-07-04    [name] returns today for followup of his hypertension.  We started  him on amlodipine 5 mg a day.   His blood pressures at home have been good except in the early mornings,  where they are running about 150 systolic.   He has had no lower extremity edema.  He has had no other side effects.   PHYSICAL EXAMINATION:  GENERAL:  Today, his blood pressure is 136/80,  his pulse is 57 and regular, his weight is 182, stable.  HEART:  Regular rate and rhythm.  LUNGS:  Clear.  EXTREMITIES:  No edema.  Pulses are present.   His EKG shows sinus bradycardia with first-degree AV block with PVCs.  There has been no change.   ASSESSMENT/PLAN:  I have asked [name] to take his amlodipine at night  to give him better early morning coverage.  He will keep a watch on his  blood pressure.  I will plan on seeing him back in 6 months, otherwise.     Bradley C. Daleen Squibb, MD, [facility]  Electronically Signed    TCW/MedQ  DD: 06/30/2008  DT: 07/01/2008  Job #: [id number]

## 2011-01-05 NOTE — Procedures (Signed)
EEG NUMBER:  [id number].   REFERRING PHYSICIAN:  Avie Echevaria, MD.   CLINICAL HISTORY:  This 79-year male being evaluated for possible  seizure.   MEDICATIONS LISTED:  Zocor, Toprol, hydrochlorothiazide, Zestril,  Keppra.   There is a 17-channel EEG recorded with the patient awake and drowsy  states using standard 10/20 electrode placement.   Background awake rhythm consists of 10-11 Hz alpha which is of decreased  amplitude, synchronous and reactive to eye-opening and closure.  No  paroxysmal epileptiform activity, spikes or sharp waves are seen.  Changes of mild drowsiness are achieved but no definitive sleep stages  are seen.  Length of this EEG is 21.4 minutes.  Technical component is  average.  EKG tracing reveals regular sinus rhythm.  Hyperventilation is  not performed.  Photic stimulation is unremarkable.   IMPRESSION:  This EEG performed during awake and drowsy states is within  normal limits.  No definite epileptiform features were seen.           ______________________________  Sunny Schlein. Pearlean Brownie, MD     [id number]  D:  11/21/2006 18:57:57  T:  11/22/2006 00:59:25  Job #:  [id number]   cc:   Genene Churn. Love, M.D.  Fax: [phone]

## 2011-01-05 NOTE — Op Note (Signed)
NAMEJAYMIR, [name] NO.:  [id number]   MEDICAL RECORD NO.:  [id number]          PATIENT TYPE:  INP   LOCATION:  2550                         FACILITY:  [facility]   PHYSICIAN:  Larina Earthly, M.D.    DATE OF BIRTH:  01-08-1928   DATE OF PROCEDURE:  11/28/2006  DATE OF DISCHARGE:                               OPERATIVE REPORT   PREOPERATIVE DIAGNOSIS:  Severe symptomatic left internal carotid  stenosis.   POSTOPERATIVE DIAGNOSIS:  Severe symptomatic left internal carotid  stenosis.   PROCEDURE:  Left carotid endarterectomy and Dacron patch angioplasty.   SURGEON:  Larina Earthly, M.D.   ASSISTANTS:  1. J.D. Hart Rochester, M.D.  2. Charlesetta Garibaldi, P.A.-C.   ANESTHESIA:  General endotracheal.   COMPLICATIONS:  None.   DISPOSITION:  To recovery room, neurologically intact.   PROCEDURE IN DETAIL:  The patient was taken to the operating position  and placed in supine position, where the area of the left neck was  prepped and draped in the usual sterile fashion.  An incision was made  over the anterior sternocleidomastoid and carried down through the  platysma with electrocautery.  The sternocleidomastoid was reflected  posteriorly and the carotid sheath was opened.  Facial vein was ligated  with 2-0 silk ties and divided.  The common carotid artery was encircled  with an umbilical tape and Rumel tourniquet.  The vagus and hypoglossal  nerves were identified and preserved.  Dissection was extended onto the  bifurcation.  The superior thyroid artery was encircled with a 2-0 silk  Potts tie, the external carotid encircled with a blue Vesseloop, the  internal carotid encircled with an umbilical tape and Rumel tourniquet.  The patient was given 9000 units of intravenous heparin and after  adequate circulation time, the internal, external and common carotid  arteries were occluded.  The common carotid artery was opened with an 11  blade and extended longitudinally with  Potts scissors through the plaque  onto the internal carotid artery.  A 10 shunt was passed up the internal  carotid artery, allowed to back-bleed and then down the common carotid  artery, where it was secured with Rumel tourniquets.  The endarterectomy  was begun on the common carotid artery and the plaque was divided  proximally with Potts scissors.  The endarterectomy was extended onto  the bifurcation.  The external carotid was then endarterectomized with  an eversion technique and the internal carotid was endarterectomized in  an open fashion.  Remaining atheromatous debris was removed from the  endarterectomy plane.  A Finesse Hemashield Dacron patch was brought  onto the field and was sewn as patch angioplasty with a running 6-0  Prolene suture.  Prior to completion of the anastomosis, the shunt was  removed and the usual flushing maneuvers were undertaken.  The  anastomosis was completed and flow was restored first to the external  and then finally the internal carotid artery.  Occlusion clamps were  removed.  Excellent flow characteristics were noted with handheld  Doppler.  The patient had some hypotension throughout the case  and on  restoring flow to the internal carotid artery, the patient had immediate  drop in blood pressure to the 70s and 80s and a heart rate into the 30s  and 40s.  This responded with atropine and dopamine.  The patient was  initially given 50 mg of protamine to reverse the heparin.  The wounds  were irrigated with saline and hemostasis with electrocautery.  The  wounds were closed with 3-0 Vicryl to reapproximate sternocleidomastoid  over the carotid sheath.  Next, the platysma was closed a running 3-0  Vicryl sutures and finally the skin was closed with a 4-0 subcuticular  Vicryl stitch.  Benzoin and Steri-Strips were applied.  The patient was  awakened in the operating room, neurologically intact, and was  transferred to the recovery room in stable  condition.      Larina Earthly, M.D.  Electronically Signed     TFE/MEDQ  D:  11/28/2006  T:  11/28/2006  Job:  [id number]   cc:   Genene Churn. Love, M.D.  Thomas C. Wall, MD, [facility]

## 2011-01-05 NOTE — Discharge Summary (Signed)
[name], SPARK NO.:  [id number]   MEDICAL RECORD NO.:  [id number]          PATIENT TYPE:  INP   LOCATION:  3029                         FACILITY:  [facility]   PHYSICIAN:  Genene Churn. Love, M.D.    DATE OF BIRTH:  12-07-1927   DATE OF ADMISSION:  11/19/2006  DATE OF DISCHARGE:  11/22/2006                               DISCHARGE SUMMARY   PATIENT'S ADDRESS IS:  [address] [place], [place],  [address].     This was the first [facility] admission for this 77 year old  right-handed white married male admitted through the emergency room for  evaluation of right hand involuntary movements.   HISTORY OF PRESENT ILLNESS:  [name] has a history of hypertension for  10 years, a myocardial infarction in 1993, hyperlipidemia, and has been  followed by Dr. Valera Castle with documented left internal carotid artery  stenosis and recurrent Doppler evaluation.  He was in his usual state of  health until November 19, 2006, when he lied down to take a nap and awoke  about 2:30 the afternoon with right hand involuntary movements  unassociated with headache, syncope or chest pain.  He came to the  emergency room, where the movements stopped but then restarted  involuntarily.  There was no associated right face or leg  symptomatology.  He has no known history of stroke and denied any  history of head or neck trauma.   PAST MEDICAL HISTORY:  1. Hernia repair in 1982.  2. Tonsillectomy 10 years ago.  3. Hypertension.  4. Myocardial infarction in 1993.  5. Hyperlipidemia.  6. Documented left carotid disease.   SOCIAL HISTORY:  He is educated through a Manufacturing engineer and had a  course toward his Ph.D. degree in Psychologist, sport and exercise.  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   MEDICATIONS:  1. Zocor 20 mg daily.  2. Toprol XL 50 mg daily.  3. Lisinopril 40 mg daily.  4. Hydrochlorothiazide 12.5 mg daily.  5. Aspirin two per  day.   Drinks three drinks of alcohol a night.  He does not smoke cigarettes.   PHYSICAL EXAMINATION WHEN HE WAS ADMITTED:  VITAL SIGNS:  Blood pressure  right and left arm 160/80 and 140/80, heart rate was 64.  NECK:  There were no bruits.  NEUROLOGIC:  Remarkable for right hand and voluntary movements.  They  were more like a tremor than true seizures but could represent seizure.   LABORATORY DATA:  Hemoglobin of 12.9, hematocrit 38.8.  Repeat  hemoglobin 11.6, hematocrit 34.7, platelet count was 172,000.  Pro time  13.4, INR 1, PTT 29.  Initial sodium was 140, potassium 4, chloride 111,  CO2 content was 20, glucose 90, BUN 25, creatinine 1.1.  Repeat  comprehensive metabolic panel was normal, except for a glucose of 116.  His hemoglobin A1c was 5.9.  CK 103, CK-MB 2.4, relative index 2.3,  troponin 0.02.  Cholesterol was 134, triglyceride was 91, HDL was 48,  LDL was 68.  His homocysteine level was 7.5, which  is in the normal  range.  Urine drug screen was negative.  His urinalysis was  unremarkable.  Telemetry hospital showed normal sinus rhythm and sinus  bradycardia.  His 12-lead EKG showed sinus bradycardia with first-degree  AV block but otherwise was a normal EKG.  CT scan of the brain without  contrast showed no CT evidence of acute stroke or other intracranial  process.  His MRI study of the brain without contrast showed scattered  foci of signal hyperintensities in the diffusion weighted study in the  axial images in the left parietal occipital region and the left mid  frontal cortical region and left head of the caudate, most consistent  with acute ischemia probably related to microembolic phenomena.  There  was some nonspecific subcortical white matter supratentorially, most  likely secondary to ischemic gliosis.  There was an old lacune in the  right corona radiata.  There was evidence of mild sinus disease in the  ethmoids. Intracranial MRA showed high-grade  hemodynamically significant  stenosis of the right posterior cerebral artery, an approximately 2.5x 2  mm saccular aneurysm in the superior in the superior hypophyseal region  in the left ICA intracranially.  Chest x-ray showed cardiomegaly with  mild bibasilar atelectasis.  His Doppler study of the carotids showed no  ICA stenosis on the right and greater than 80% ICA stenosis on the left.  A 2D echocardiogram was unremarkable for a source of cardiac emboli.  EEG was normal.   HOSPITAL COURSE:  The patient was placed on Keppra medication for  possible seizures, on which the involuntary movements of the right hand  and arm did improve.  He had a slight decrease in the right nasal labial  fold.  On November 22, 2006, his tremor had resolved.  It was noted because  of the microemboli to the left brain in the left ICA distribution and  the documented left internal carotid artery stenosis that he most likely  had carotid source of emboli to the left brain causing his involuntary  movement disorder.  He was seen in consultation on November 21, 2006 by Dr.  Gretta Began.  It was recommended that he had surgery.  This was scheduled  for this coming week.  In the hospital, his Keppra was decreased because  it was felt that he probably was not having seizures.  He was maintained  on his home medications and was able to ambulate.  He was continued on  aspirin therapy.  He had improvement in symptomatology in the hospital.   IMPRESSION:  1. 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  2. Movement disorder, code 781.0.  3. Hypertension, code 796.2.  4. Coronary artery disease, code 429.2.  5. Hyperlipidemia, code 272.4.  6. Asymptomatic aneurysm of the brain, measuring 2.5 mm on the left      side in the superior hypophyseal region, code 437.3.   PLAN:  Plan at this time is to taper him off the Keppra.  Have him return this coming week for surgery.   DISCHARGE  MEDICATIONS:  1. Keppra 250 mg p.o. b.i.d.  2. Aspirin 325 mg 2 daily.  3. Zocor 20 mg daily.  4. Toprol XL 50 mg daily.  5. Lisinopril 4 mg daily.  6. Hydrochlorothiazide 12.5 mg daily.   He is discharged improved from his prehospital status.           ______________________________  Genene Churn. Love,  M.D.     JML/MEDQ  D:  11/25/2006  T:  11/25/2006  Job:  [id number]   cc:   Larina Earthly, M.D.  Thomas C. Wall, MD, [facility]

## 2011-01-05 NOTE — Consult Note (Signed)
NAME:  [name], [name] NO.:  [id number]   MEDICAL RECORD NO.:  [id number]          PATIENT TYPE:  INP   LOCATION:  3029                         FACILITY:  [facility]   PHYSICIAN:  Larina Earthly, M.D.    DATE OF BIRTH:  1927-12-16   DATE OF CONSULTATION:  DATE OF DISCHARGE:                                 CONSULTATION   VASCULAR SURGICAL CONSULTATION:   HISTORY OF PRESENT ILLNESS:  The patient is a 77 year old right-handed  white male who was admitted to the emergency department on November 19, 2006, with right-handed continuous  involuntary movement, and weakness.  He had had no prior neurologic events similar to this.  This did resolve  since his admission to the hospital 2 days ago.  He does have a known  history of moderate asymptomatic stenosis of his left internal carotid  artery but denies any prior neurologic deficits.  He does have a past  medical history of myocardial infarction in 1993 with no cardiac  difficulties since that time, a history of hypertension and elevated  lipids.   FAMILY HISTORY:  Negative for premature atherosclerotic disease.   SOCIAL HISTORY:  He does not smoke.  He does have several alcohol drinks  per day.   PHYSICAL EXAM:  Well-developed, well-nourished white male appearing  stated age of 58.  He is grossly intact neurologically.  His pulse  status is 2+ radial, 2+ posterior tibial pulses bilaterally.  Physical  exam is otherwise unremarkable.   He did undergo an MRI which showed left middle cerebral artery embolic  events, also a possible right cerebral aneurysm, also a possible  stenosis of the posterior communicating artery intracranially.  He has  an EEG which was obtained approximately 1 hour ago and results are  pending, and also underwent carotid Duplex revealing a severe stenosis  in his left internal parotid artery with no significant right parotid  stenosis.  I discussed this in length with Mr. Longest.  I explained that  he  does have severe left internal parotid artery stenosis that likely  did cause his neurologic deficit with right-handed continuous  involuntary movement.  He will continue his neurologic workup.  We will  follow along to recommend appropriate timing for left parotid  endarterectomy.      Larina Earthly, M.D.  Electronically Signed     TFE/MEDQ  D:  11/21/2006  T:  11/22/2006  Job:  [id number]   cc:   Genene Churn. Love, M.D.  Thomas C. Wall, MD, [facility]

## 2011-01-05 NOTE — Discharge Summary (Signed)
[name], [name] NO.:  [id number]   MEDICAL RECORD NO.:  [id number]          PATIENT TYPE:  INP   LOCATION:  3307                         FACILITY:  [facility]   PHYSICIAN:  Constance Holster, PA    DATE OF BIRTH:  20-Jul-1928   DATE OF ADMISSION:  11/28/2006  DATE OF DISCHARGE:                               DISCHARGE SUMMARY   ADMISSION DIAGNOSIS:  Symptomatic left internal carotid artery stenosis.   DISCHARGE DIAGNOSES:  1. Left internal carotid artery stenosis status post left CEA.  2. Hypertension.  3. History of myocardial infarction in 1993.  4. Hyperlipidemia.  5. Hernia repair in 1982.  6. Tonsillectomy 10 years ago.   PROCEDURE:  On November 28, 2006 patient underwent left carotid  endarterectomy __________ angioplasty per Dr. Gretta Began.   HISTORY AND PHYSICAL:  This is a 77 year old male with a history of  hypertension for 10 years and MI in 1993 who has been followed by Dr.  Daleen Squibb with documented left internal carotid artery stenosis.  Patient is  in his usual state of health until November 19, 2006 when he laid down to  take a nap and then woke up about 2:30 this afternoon with right hand  involuntary movements and not associated with headaches, syncope and  chest pain.  He came to the ER where the movements were stopped and then  restarted involuntarily.  No associated right facial like  symptomatology.  He has no history of stroke or denies history of head  or neck trauma.  Patient was placed on Keppra medication for her  possible seizures.  It was noted microemboli to the left brain and the  left internal carotid artery distribution and the documented left  internal artery stenosis that he most likely had carotid source of  emboli to the left brain causing involuntary movement disorder.  Patient  was seen by Dr. Tawanna Cooler Early who recommended that he have surgery.  Patient was scheduled for surgery on November 28, 2006.   HOSPITAL COURSE:  November 28, 2006  patient underwent a carotid  endarterectomy by [name] without any complications.  Postoperatively  the patient is progressing well.  Postop day 1 the patient remains  afebrile.  Vital signs stable.  He is having some bradycardia  postoperatively with primary AV.  The patient's Toprol is being held and  he is being weaned off dopamine drip.  Patient's O2 stats are stable at  96% on 2 liters O2.  He has good urine output.  Patient CPK and  troponins are negative.   Neurologically the patient is intake with no focal defects.  He is alert  and oriented x3.  No motor or sensory defects.  Patient's heart is sinus  rhythm.  His incision is clear, dry and intact with no signs of  infection.  Patient's renal function is stable with a BUN of 18 and  creatinine of 0.9.  Patient is instructed to continue his incentive  spirometry and ambulate.   DISCHARGE DISPOSITION:  Patient will be discharged home in the next 1-2  days  provided his heart rate remains stable.   DISCHARGE MEDICATIONS:  1. Include Tylox 1-2 tabs p.o. every 4 hours p.r.n.  2. Toprol XL 50 mg p.o. daily provided his heart rate is greater than      100/60.  3. Lisinopril 40 mg p.o. daily.  4. Hydrochlorothiazide 12.5 mg p.o. daily.  5. Aspirin 81 mg p.o. daily.  6. Multivitamin daily.  7. Vitamin E 400 mg p.o. daily.  8. Omega 3 one p.o. daily.  9. Calcium 200 mg daily.  10.Glucosamine daily.  11.Vitamin C 250 mg daily.   INSTRUCTIONS:  Patient instructed to follow low fat, low salt diet.  No  driving, heavy lifting greater than 10 pounds for 2 weeks.  Patient's  ambulatory __________ as tolerated.  He may shower and clean the  incision with mild soap and water.  He will call if any wound problems  should arise such as an incision erythema or drainage, or temperature  greater than 101.5.   FOLLOWUP:  Patient will follow up with [name] in 3 weeks, office to  contact him with time and date of appointment.       Constance Holster, PA     JMW/MEDQ  D:  11/29/2006  T:  11/29/2006  Job:  [id number]

## 2011-01-05 NOTE — H&P (Signed)
[name], [name] NO.:  [id number]   MEDICAL RECORD NO.:  [id number]          PATIENT TYPE:  INP   LOCATION:  1831                         FACILITY:  [facility]   PHYSICIAN:  Genene Churn. Love, M.D.    DATE OF BIRTH:  April 08, 1928   DATE OF ADMISSION:  11/19/2006  DATE OF DISCHARGE:                              HISTORY & PHYSICAL   This is the first [facility] admission for this 77 year old  right-handed white married male admitted from the emergency room for  evaluation of right hand involuntary movements.   HISTORY OF PRESENT ILLNESS:  [name] has had a history of hypertension  for 10 years, he has had a myocardial infarction in 1993, and has had  hyperlipidemia, treated with Zocor.  He was in his usual state of health  until 11/19/2006 when he laid down to take a nap and awoke about 2:30 in  the afternoon with right hand involuntary movements unassociated with  headache, syncope or chest pain.  When he came to the emergency room the  movements stopped but then restarted.  He has no known prior history of  stroke.  He denies any head or neck trauma.  His past medical history is  significant for left hernia repair in 1982, right  repair in 1992,  tonsillectomy, 10 year history of hypertension and known history of  myocardial infarction and 1993.   SOCIAL HISTORY:  Was educated through [employer] degree and had course in  Ph.D. degree in Publishing copy.  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   His medications are Zocor 20 mg daily, Toprol XL 50 mg daily, lisinopril  40 mg daily, hydrochlorothiazide 12.5 mg, aspirin two p.o. daily.   SOCIAL HISTORY:  He drinks three drinks per night.  He does not smoke  cigarettes.   FAMILY HISTORY:  His mother died at 90 from diabetes mellitus and  strokes.  Father died of 59 from stomach disease.  He has one brother  who died at 64 in motor vehicle accident.  He has a sister  died at 90  from colon cancer.  He has three other sisters, 66, 72 and 74 living and  well.   PHYSICAL EXAMINATION:  Well-developed, pleasant white male, blood  pressure right and left arm 160/80, 140/80.  Heart rate 64.  There were  no bruits.  Mental status:  He was alert, oriented x3, his cranial nerve  examination revealed visual fields to be full.  Both disks were seen  flat.  Extraocular were full and corneals were present  There was no  seventh nerve palsy.  Hearing was decreased air conduction greater than  bone conduction.  Tongue was midline.  Uvula was midline.  Gags were  present.  Sternocleidomastoid and trapezius testing normal.  Motor  examination revealed 5/5 strength proximally and distally in the upper  and lower extremities.  He had good outstretched hand and arm strength.  He had a involuntary movement of his right hand and fingers of his right  hand.  He could still however perform finger-to-nose and heel-to-shin.  Sensory examination was intact pinprick, light touch, joint system,  vibration testing.  Deep tendon reflexes were 1 to 2+.  Plantar  responses were downgoing.  General examination revealed no heart murmur.  The lungs were clear.  He had a protuberant abdomen without enlargement  of  liver, spleen or kidneys.  There is no cyanosis, clubbing or edema  in extremities.   LABORATORY DATA:  Reveals a hemoglobin of 12.9, hematocrit 38.0,  creatinine 1.1.  Sodium 140, potassium 4.0, chloride 111, CO2 content  25, glucose 90.  CT scan of the brain showed evidence of atrophy.   IMPRESSION:  1. Involuntary movements code 781.0 most likely representing focal      seizures.  __________ stroke code 245.50 and 434.01.  2. Coronary artery disease code 429.9.  3. Hyperlipidemia, code 272.4.  4. Hypertension code 796.2.   Plan at this time is to admit the patient to try IV Keppra.           ______________________________  Genene Churn. Sandria Manly, M.D.     JML/MEDQ  D:   11/19/2006  T:  11/20/2006  Job:  [id number]

## 2011-01-05 NOTE — Assessment & Plan Note (Signed)
[facility]                            CARDIOLOGY OFFICE NOTE   BRACH, BIRDSALL                           MRN:          [id number]  DATE:12/20/2006                            DOB:          07/24/1928    Mr. Kelemen comes back today after being discharged from the hospital with  a left ischemic stroke and subsequent carotid endarterectomy by Dr. Gretta Began. Please see discharge summary for details.   He presented with nothing but a right arm involuntary motion. His MRI  showed evidence of ischemic disease on the left with some evidence of  microemboli. Carotid Doppler showed an 80% plus stenosis. He had had 2  carotid Dopplers in our office last year, the last being November of  2007 at which time his left was 60-79%.   He has done well since surgery.   His blood pressure is under good control, his hemoglobin A1c was 5.9%  and his lipids also were at goal. His total cholesterol was 134, LDL 68,  HDL 48, triglycerides 91.   CURRENT MEDICATIONS:  1. Aspirin 81 mg a day, 2 of those.  2. Vitamin C.  3. Vitamin E.  4. Lisinopril 40 mg a day.  5. Toprol XL 50 mg a day.  6. Zocor 20 mg a day.  7. Foltx daily.  8. Multivitamin.  9. Calcium.  10.Glucosamine.  11.Microzide 12.5 daily.  12.Levitra p.r.n.   PHYSICAL EXAMINATION:  VITAL SIGNS:  His blood pressure is 124/72, his  pulse is 68 and regular, his weight is 171.  HEENT:  Normocephalic, atraumatic. PERRLA. Extraocular movements intact.  Sclera clear. Facial symmetry is normal.  NECK:  Supple. Carotid upstrokes were equal bilaterally, there is a left  carotid endarterectomy scar. There is no bruit. Thyroid is not enlarged,  trachea is midline.  LUNGS:  Clear.  HEART:  Reveals a nondisplaced PMI, normal S1, S2.  ABDOMEN:  Soft with good bowel sounds, no midline bruit.  EXTREMITIES:  Reveal no edema. Pulses are intact.  NEUROLOGIC:  Grossly intact.   Electrocardiogram  shows sinus rhythm  with a first degree AV block with  minimal voltage criteria of LVH, occasional PVC and nonspecific changes.  There has been no significant change.   ASSESSMENT/PLAN:  Mr. Albanese is stable from a cardiac standpoint. I  suspect he ruptured a plaque in his left internal carotid artery. I am  delighted with his lipids and his blood sugar control as well as his  blood pressure. I have made no changes to his program. I will see him  back in 6 months.     Thomas C. Daleen Squibb, MD, [facility]  Electronically Signed    TCW/MedQ  DD: 12/20/2006  DT: 12/20/2006  Job #: [id number]   cc:   Genene Churn. Love, M.D.  Larina Earthly, M.D.

## 2011-04-16 ENCOUNTER — Telehealth: Payer: Self-pay | Admitting: Cardiology

## 2011-04-16 NOTE — Telephone Encounter (Signed)
Walk In Pt Form " pt Dropped off Prescriptions" sent to Message Nurse  04/16/11/km

## 2011-04-20 ENCOUNTER — Telehealth: Payer: Self-pay | Admitting: *Deleted

## 2011-04-20 MED ORDER — LISINOPRIL 40 MG PO TABS: 40.0000 mg | ORAL_TABLET | Freq: Every day | ORAL | Status: DC

## 2011-04-20 MED ORDER — HYDROCHLOROTHIAZIDE 12.5 MG PO CAPS: 12.5000 mg | ORAL_CAPSULE | Freq: Every day | ORAL | Status: DC

## 2011-04-20 MED ORDER — SIMVASTATIN 20 MG PO TABS: 20.0000 mg | ORAL_TABLET | Freq: Every day | ORAL | Status: DC

## 2011-04-20 MED ORDER — AMLODIPINE BESYLATE 5 MG PO TABS: 5.0000 mg | ORAL_TABLET | Freq: Every day | ORAL | Status: DC

## 2011-04-20 MED ORDER — METOPROLOL SUCCINATE ER 50 MG PO TB24: 50.0000 mg | ORAL_TABLET | Freq: Every day | ORAL | Status: DC

## 2011-04-20 NOTE — Telephone Encounter (Signed)
Pt is aware [facility] at his request Mylo Red RN

## 2011-04-20 NOTE — Telephone Encounter (Signed)
Pt needs lisinopril, amlodipine, simvastatin, metoprol and hctz [facility]. Will fax in today. Mylo Red RN

## 2011-05-04 ENCOUNTER — Encounter: Payer: Self-pay | Admitting: *Deleted

## 2011-05-04 ENCOUNTER — Ambulatory Visit (INDEPENDENT_AMBULATORY_CARE_PROVIDER_SITE_OTHER): Payer: Medicare Other | Admitting: Cardiology

## 2011-05-04 ENCOUNTER — Ambulatory Visit: Payer: Self-pay | Admitting: Cardiology

## 2011-05-04 ENCOUNTER — Encounter: Payer: Self-pay | Admitting: Cardiology

## 2011-05-04 VITALS — BP 110/64 | HR 54 | Ht 68.0 in | Wt 182.4 lb

## 2011-05-04 DIAGNOSIS — I6529 Occlusion and stenosis of unspecified carotid artery: Secondary | ICD-10-CM

## 2011-05-04 DIAGNOSIS — E785 Hyperlipidemia, unspecified: Secondary | ICD-10-CM

## 2011-05-04 DIAGNOSIS — I252 Old myocardial infarction: Secondary | ICD-10-CM

## 2011-05-04 DIAGNOSIS — I10 Essential (primary) hypertension: Secondary | ICD-10-CM

## 2011-05-04 DIAGNOSIS — I4949 Other premature depolarization: Secondary | ICD-10-CM

## 2011-05-04 DIAGNOSIS — I2510 Atherosclerotic heart disease of native coronary artery without angina pectoris: Secondary | ICD-10-CM

## 2011-05-04 NOTE — Progress Notes (Signed)
HPI [name] comes in today for evaluation management coronary artery disease, history of carotid disease, history of previous myocardial infarction.  Denies any angina or chest discomfort. He is not as active as he used to be. Compliant with his medications.  He denies any palpitations, presyncope or syncope.  EKG today shows sinus bradycardia with a first-degree AV block. There is moderate voltage criteria for LVH.  Medications reviewed. He is on about a block or as well as an ACE inhibitor and diuretic for his hypertension. His blood pressure is good today. Past Medical History  Diagnosis Date  . PREMATURE VENTRICULAR CONTRACTIONS   . Old myocardial infarction   . HYPERTENSION, UNSPECIFIED   . HYPERLIPIDEMIA-MIXED   . CAROTID ARTERY STENOSIS, WITHOUT INFARCTION   . CAD, NATIVE VESSEL   . AV BLOCK, 1ST DEGREE     Past Surgical History  Procedure Date  . Hernia repair   . Tonsillectomy     History reviewed. No pertinent family history.  History   Social History  . Marital Status: Married    Spouse Name: N/A    Number of Children: N/A  . Years of Education: N/A   Occupational History  . Not on file.   Social History Main Topics  . Smoking status: Never Smoker   . Smokeless tobacco: Not on file  . Alcohol Use: No  . Drug Use: No  . Sexually Active: Not on file   Other Topics Concern  . Not on file   Social History Narrative  . No narrative on file    Not on File  Current Outpatient Prescriptions  Medication Sig Dispense Refill  . aspirin 325 MG tablet Take 325 mg by mouth daily.        . calcium carbonate 200 MG capsule Take 250 mg by mouth daily.        . fish oil-omega-3 fatty acids 1000 MG capsule Take 2 g by mouth daily.        Marland Kitchen glucosamine-chondroitin 500-400 MG tablet Take 1 tablet by mouth daily.        . hydrochlorothiazide (,MICROZIDE/HYDRODIURIL,) 12.5 MG capsule Take 1 capsule (12.5 mg total) by mouth daily.  90 capsule  3  . lisinopril  (PRINIVIL,ZESTRIL) 40 MG tablet Take 1 tablet (40 mg total) by mouth daily.  90 tablet  3  . magnesium 30 MG tablet Take 30 mg by mouth daily.        . metoprolol (TOPROL-XL) 50 MG 24 hr tablet Take 1 tablet (50 mg total) by mouth daily.  90 tablet  3  . Multiple Vitamins-Minerals (MULTIVITAMIN WITH MINERALS) tablet Take 1 tablet by mouth daily.        . simvastatin (ZOCOR) 20 MG tablet Take 1 tablet (20 mg total) by mouth at bedtime.  90 tablet  3  . vitamin C (ASCORBIC ACID) 500 MG tablet Take 500 mg by mouth daily.        . Vitamin D, Ergocalciferol, (DRISDOL) 50000 UNITS CAPS Take 50,000 Units by mouth.        . vitamin E 200 UNIT capsule Take 200 Units by mouth daily.          ROS Negative other than HPI.   PE General Appearance: well developed, well nourished in no acute distress, disheveled, looks older than stated age HEENT: symmetrical face, PERRLA, good dentition  Neck: no JVD, thyromegaly, or adenopathy, trachea midline Chest: symmetric without deformity Cardiac: PMI non-displaced, RRR, normal S1, S2, no gallop or  murmur Lung: clear to ausculation and percussion Vascular: all pulses full , left carotid bruitAbdominal: nondistended, nontender, good bowel sounds, no HSM, no bruits Extremities: no cyanosis, clubbing or edema, no sign of DVT, no varicosities  Skin: normal color, no rashes Neuro: alert and oriented x 3, non-focal Pysch: normal affect Filed Vitals:   05/04/11 1530  BP: 110/64  Pulse: 54  Height: 5\' 8"  (1.727 m)  Weight: 182 lb 6.4 oz (82.736 kg)    EKG  Labs and Studies Reviewed.   Lab Results  Component Value Date   WBC 6.2 05/12/2008   HGB 13.6 05/12/2008   HCT 39.1 05/12/2008   MCV 96.2 05/12/2008   PLT 189 05/12/2008      Chemistry      Component Value Date/Time   NA 143 05/24/2010 0853   K 4.4 05/24/2010 0853   CL 108 05/24/2010 0853   CO2 28 05/24/2010 0853   BUN 23 05/24/2010 0853   CREATININE 1.0 05/24/2010 0853      Component Value Date/Time    CALCIUM 9.1 05/24/2010 0853   ALKPHOS 57 05/24/2010 0853   AST 27 05/24/2010 0853   ALT 25 05/24/2010 0853   BILITOT 0.7 05/24/2010 0853       Lab Results  Component Value Date   CHOL 139 05/24/2010   Lab Results  Component Value Date   HDL 51.30 05/24/2010   Lab Results  Component Value Date   LDLCALC 70 05/24/2010   Lab Results  Component Value Date   TRIG 87.0 05/24/2010   Lab Results  Component Value Date   CHOLHDL 3 05/24/2010   No results found for this basename: HGBA1C   Lab Results  Component Value Date   ALT 25 05/24/2010   AST 27 05/24/2010   ALKPHOS 57 05/24/2010   BILITOT 0.7 05/24/2010   No results found for this basename: TSH

## 2011-05-04 NOTE — Assessment & Plan Note (Signed)
Asymptomatic.Continue secondary prevention and followup with [name] for dopplers.

## 2011-05-04 NOTE — Patient Instructions (Signed)
Your physician recommends that you schedule a follow-up appointment in: 1 year with Dr. Wall  

## 2011-05-04 NOTE — Assessment & Plan Note (Signed)
Stable.  Continue current medications.

## 2011-07-25 ENCOUNTER — Encounter: Payer: Self-pay | Admitting: Vascular Surgery

## 2011-08-27 ENCOUNTER — Encounter: Payer: Self-pay | Admitting: Vascular Surgery

## 2011-08-28 ENCOUNTER — Encounter: Payer: Self-pay | Admitting: Vascular Surgery

## 2011-08-28 ENCOUNTER — Ambulatory Visit (INDEPENDENT_AMBULATORY_CARE_PROVIDER_SITE_OTHER): Payer: Medicare Other | Admitting: Vascular Surgery

## 2011-08-28 VITALS — BP 148/66 | HR 88 | Resp 20 | Ht 68.0 in | Wt 186.0 lb

## 2011-08-28 DIAGNOSIS — I6529 Occlusion and stenosis of unspecified carotid artery: Secondary | ICD-10-CM

## 2011-08-28 DIAGNOSIS — Z48812 Encounter for surgical aftercare following surgery on the circulatory system: Secondary | ICD-10-CM

## 2011-08-28 NOTE — Progress Notes (Signed)
The patient presents today for followup of his carotid disease. He is status post left carotid endarterectomy by myself in April of 2008. He has had no symptoms since his surgery. He does have known coronary artery disease and has been stable from this standpoint as well. He is active and denies amaurosis fugax, transient ischemic attack or stroke. Past Medical History  Diagnosis Date  . PREMATURE VENTRICULAR CONTRACTIONS   . HYPERTENSION, UNSPECIFIED   . HYPERLIPIDEMIA-MIXED   . CAROTID ARTERY STENOSIS, WITHOUT INFARCTION   . CAD, NATIVE VESSEL   . AV BLOCK, 1ST DEGREE   . Old myocardial infarction     02/1992  . GERD (gastroesophageal reflux disease)   . Stroke   . TIA (transient ischemic attack)     History  Substance Use Topics  . Smoking status: Never Smoker   . Smokeless tobacco: Never Used  . Alcohol Use: 12.6 oz/week    21 Glasses of wine per week    No family history on file.  No Known Allergies  Current outpatient prescriptions:amLODipine (NORVASC) 10 MG tablet, Take 10 mg by mouth daily.  , Disp: , Rfl: ;  aspirin 325 MG tablet, Take 325 mg by mouth daily.  , Disp: , Rfl: ;  calcium carbonate 200 MG capsule, Take 250 mg by mouth daily.  , Disp: , Rfl: ;  fish oil-omega-3 fatty acids 1000 MG capsule, Take 2 g by mouth daily.  , Disp: , Rfl: ;  glucosamine-chondroitin 500-400 MG tablet, Take 1 tablet by mouth daily.  , Disp: , Rfl:  hydrochlorothiazide (,MICROZIDE/HYDRODIURIL,) 12.5 MG capsule, Take 1 capsule (12.5 mg total) by mouth daily., Disp: 90 capsule, Rfl: 3;  lisinopril (PRINIVIL,ZESTRIL) 40 MG tablet, Take 1 tablet (40 mg total) by mouth daily., Disp: 90 tablet, Rfl: 3;  magnesium 30 MG tablet, Take 30 mg by mouth daily.  , Disp: , Rfl: ;  metoprolol (TOPROL-XL) 50 MG 24 hr tablet, Take 1 tablet (50 mg total) by mouth daily., Disp: 90 tablet, Rfl: 3 Multiple Vitamins-Minerals (MULTIVITAMIN WITH MINERALS) tablet, Take 1 tablet by mouth daily.  , Disp: , Rfl: ;   simvastatin (ZOCOR) 20 MG tablet, Take 1 tablet (20 mg total) by mouth at bedtime., Disp: 90 tablet, Rfl: 3;  vitamin C (ASCORBIC ACID) 500 MG tablet, Take 500 mg by mouth daily.  , Disp: , Rfl: ;  Vitamin D, Ergocalciferol, (DRISDOL) 50000 UNITS CAPS, Take 50,000 Units by mouth.  , Disp: , Rfl:  vitamin E 200 UNIT capsule, Take 200 Units by mouth daily.  , Disp: , Rfl:   BP 148/66  Pulse 88  Resp 20  Ht 5\' 8"  (1.727 m)  Wt 186 lb (84.369 kg)  BMI 28.28 kg/m2  Body mass index is 28.28 kg/(m^2).       Physical exam: Well-developed well-nourished white male in no acute distress. HEENT normal. Carotid arteries without bruits bilaterally. Well-healed left carotid incision. 2+ radial pulses bilaterally. Heart regular rate and rhythm. Chest clear bilaterally.  Carotid duplex: Widely patent left endarterectomy and no significant right common carotid artery stenosis. He does have significant right external carotid artery stenosis The patient this is of no risk.  Impression and plan: stable status post left carotid endarterectomy 2008. Continued yearly carotid duplex followup. The patient will notify should he develop any neurologic deficits.

## 2011-09-05 NOTE — Procedures (Unsigned)
CAROTID DUPLEX EXAM  INDICATION:  Carotid stenosis.  HISTORY: Diabetes:  No. Cardiac:  MI, CAD. Hypertension:  Yes. Smoking:  No. Previous Surgery:  Left carotid endarterectomy on 11/28/2006. CV History:  History of TIAs, currently asymptomatic. Amaurosis Fugax No, Paresthesias No, Hemiparesis No.                                      RIGHT             LEFT Brachial systolic pressure:         146               150 Brachial Doppler waveforms:         WNL               WNL Vertebral direction of flow:        Antegrade         Antegrade DUPLEX VELOCITIES (cm/sec) CCA peak systolic                   169               67 ECA peak systolic                   300               128 ICA peak systolic                   78                74 ICA end diastolic                   23                23 PLAQUE MORPHOLOGY:                  Calcified         Not visualized PLAQUE AMOUNT:                      Mild              Not visualized PLAQUE LOCATION:                    CCA/ICA/ECA       Not visualized  IMPRESSION: 1. Right common carotid artery disease present. 2. Right external carotid artery stenosis present. 3. Right internal carotid artery stenosis present in the 1% to 39%     range.  Stenosis may be under-estimated due to presence of calcific     plaque, making Doppler interrogation difficult. 4. Left internal carotid artery is patent with history of     endarterectomy.  No hyperplasia or plaque is identified. 5. Bilateral vertebrals are patent and antegrade. 6. Essentially unchanged since the previous study on 08/01/2010.  ___________________________________________ Larina Earthly, M.D.  SH/MEDQ  D:  08/28/2011  T:  08/28/2011  Job:  [id number]

## 2012-03-26 ENCOUNTER — Telehealth: Payer: Self-pay | Admitting: Cardiology

## 2012-03-26 NOTE — Telephone Encounter (Signed)
Walk in pt Form "Pt Dropped Off [facility]" Placed in Wall Doc Box"  03/26/12/KM

## 2012-03-28 ENCOUNTER — Other Ambulatory Visit: Payer: Self-pay | Admitting: *Deleted

## 2012-03-28 MED ORDER — LISINOPRIL 40 MG PO TABS: 40.0000 mg | ORAL_TABLET | Freq: Every day | ORAL | Status: DC

## 2012-03-28 MED ORDER — AMLODIPINE BESYLATE 10 MG PO TABS: 10.0000 mg | ORAL_TABLET | Freq: Every day | ORAL | Status: DC

## 2012-03-28 MED ORDER — SIMVASTATIN 20 MG PO TABS: 20.0000 mg | ORAL_TABLET | Freq: Every day | ORAL | Status: DC

## 2012-03-28 MED ORDER — HYDROCHLOROTHIAZIDE 12.5 MG PO CAPS: 12.5000 mg | ORAL_CAPSULE | Freq: Every day | ORAL | Status: DC

## 2012-03-28 MED ORDER — METOPROLOL SUCCINATE ER 50 MG PO TB24: 50.0000 mg | ORAL_TABLET | Freq: Every day | ORAL | Status: DC

## 2012-05-08 ENCOUNTER — Ambulatory Visit (INDEPENDENT_AMBULATORY_CARE_PROVIDER_SITE_OTHER): Payer: Medicare Other | Admitting: Cardiology

## 2012-05-08 ENCOUNTER — Encounter: Payer: Self-pay | Admitting: Cardiology

## 2012-05-08 VITALS — BP 130/70 | HR 50 | Ht 68.0 in | Wt 180.0 lb

## 2012-05-08 DIAGNOSIS — I498 Other specified cardiac arrhythmias: Secondary | ICD-10-CM

## 2012-05-08 DIAGNOSIS — E785 Hyperlipidemia, unspecified: Secondary | ICD-10-CM

## 2012-05-08 DIAGNOSIS — I252 Old myocardial infarction: Secondary | ICD-10-CM

## 2012-05-08 DIAGNOSIS — R001 Bradycardia, unspecified: Secondary | ICD-10-CM | POA: Insufficient documentation

## 2012-05-08 DIAGNOSIS — I2510 Atherosclerotic heart disease of native coronary artery without angina pectoris: Secondary | ICD-10-CM

## 2012-05-08 DIAGNOSIS — I6529 Occlusion and stenosis of unspecified carotid artery: Secondary | ICD-10-CM

## 2012-05-08 DIAGNOSIS — I440 Atrioventricular block, first degree: Secondary | ICD-10-CM

## 2012-05-08 DIAGNOSIS — I4949 Other premature depolarization: Secondary | ICD-10-CM

## 2012-05-08 DIAGNOSIS — I10 Essential (primary) hypertension: Secondary | ICD-10-CM

## 2012-05-08 MED ORDER — METOPROLOL SUCCINATE ER 25 MG PO TB24: 50.0000 mg | ORAL_TABLET | Freq: Every day | ORAL | Status: DC

## 2012-05-08 MED ORDER — METOPROLOL SUCCINATE ER 25 MG PO TB24: 25.0000 mg | ORAL_TABLET | Freq: Every day | ORAL | Status: DC

## 2012-05-08 NOTE — Assessment & Plan Note (Signed)
Stable. No change in medical therapy. Continue secondary preventative therapy. Return the office in one year.

## 2012-05-08 NOTE — Patient Instructions (Addendum)
Your physician has recommended you make the following change in your medication: Decrease Toprol (metoprolol succinate) to 25mg  daily  Your physician wants you to follow-up in: 1 year with Dr. Daleen Squibb. You will receive a reminder letter in the mail two months in advance. If you don't receive a letter, please call our office to schedule the follow-up appointment.

## 2012-05-08 NOTE — Progress Notes (Signed)
HPI [name] returns today for evaluation and management his coronary artery disease. He's having no angina or ischemic symptoms. He also has carotid disease which is followed by vascular surgery. He's had a previous left carotid endarterectomy.  He denies any palpitations or presyncope. He always is bradycardic but is more so today.  His blood work is being followed by primary care. He is compliant with his medications.  Past Medical History  Diagnosis Date  . PREMATURE VENTRICULAR CONTRACTIONS   . HYPERTENSION, UNSPECIFIED   . HYPERLIPIDEMIA-MIXED   . CAROTID ARTERY STENOSIS, WITHOUT INFARCTION   . CAD, NATIVE VESSEL   . AV BLOCK, 1ST DEGREE   . Old myocardial infarction     02/1992  . GERD (gastroesophageal reflux disease)   . Stroke   . TIA (transient ischemic attack)     Current Outpatient Prescriptions  Medication Sig Dispense Refill  . amLODipine (NORVASC) 5 MG tablet Take 5 mg by mouth daily.      Marland Kitchen aspirin 325 MG tablet Take 325 mg by mouth daily.        Marland Kitchen b complex vitamins tablet Take 1 tablet by mouth daily.      . Calcium Carbonate (CALCIUM 500 PO) Take 1 tablet by mouth daily.      . Calcium Carbonate-Vit D-Min (CALCIUM 600 + MINERALS) 600-200 MG-UNIT TABS Take by mouth daily.      . Glucosamine 500 MG CAPS Take by mouth daily.      . hydrochlorothiazide (MICROZIDE) 12.5 MG capsule Take 1 capsule (12.5 mg total) by mouth daily.  90 capsule  3  . lisinopril (PRINIVIL,ZESTRIL) 40 MG tablet Take 1 tablet (40 mg total) by mouth daily.  90 tablet  3  . magnesium 30 MG tablet Take 30 mg by mouth daily.        . metoprolol succinate (TOPROL-XL) 25 MG 24 hr tablet Take 1 tablet (25 mg total) by mouth daily.  90 tablet  3  . Multiple Vitamins-Minerals (MULTIVITAMIN WITH MINERALS) tablet Take 1 tablet by mouth daily.        . Omega-3 Fatty Acids (FISH OIL) 300 MG CAPS Take by mouth daily.      . simvastatin (ZOCOR) 20 MG tablet Take 1 tablet (20 mg total) by mouth at bedtime.   90 tablet  3  . vitamin C (ASCORBIC ACID) 500 MG tablet Take 500 mg by mouth daily.        . vitamin D, CHOLECALCIFEROL, 400 UNITS tablet Take 400 Units by mouth daily.      . vitamin E 100 UNIT capsule Take 100 Units by mouth daily.      Marland Kitchen DISCONTD: metoprolol succinate (TOPROL-XL) 25 MG 24 hr tablet Take 2 tablets (50 mg total) by mouth daily.  90 tablet  3  . DISCONTD: metoprolol succinate (TOPROL-XL) 50 MG 24 hr tablet Take 1 tablet (50 mg total) by mouth daily.  90 tablet  3    No Known Allergies  No family history on file.  History   Social History  . Marital Status: Married    Spouse Name: N/A    Number of Children: N/A  . Years of Education: N/A   Occupational History  . Not on file.   Social History Main Topics  . Smoking status: Never Smoker   . Smokeless tobacco: Never Used  . Alcohol Use: 12.6 oz/week    21 Glasses of wine per week  . Drug Use: No  . Sexually Active: Not  on file   Other Topics Concern  . Not on file   Social History Narrative  . No narrative on file    ROS ALL NEGATIVE EXCEPT THOSE NOTED IN HPI  PE  General Appearance: well developed, well nourished in no acute distress HEENT: symmetrical face, PERRLA, good dentition  Neck: no JVD, thyromegaly, or adenopathy, trachea midline Chest: symmetric without deformity Cardiac: PMI non-displaced, RRR, normal S1, S2, no gallop or murmur Lung: clear to ausculation and percussion Vascular: all pulses full , bilateral carotid bruits Abdominal: nondistended, nontender, good bowel sounds, no HSM, no bruits Extremities: no cyanosis, clubbing or edema, no sign of DVT, no varicosities  Skin: normal color, no rashes Neuro: alert and oriented x 3, non-focal Pysch: normal affect  EKG Sinus bradycardia with first-degree AV block, rate 50 beats per minute BMET    Component Value Date/Time   NA 143 05/24/2010 0853   K 4.4 05/24/2010 0853   CL 108 05/24/2010 0853   CO2 28 05/24/2010 0853   GLUCOSE 86  05/24/2010 0853   BUN 23 05/24/2010 0853   CREATININE 1.0 05/24/2010 0853   CALCIUM 9.1 05/24/2010 0853   GFRNONAA 73.74 05/24/2010 0853   GFRAA 105 05/12/2008 1449    Lipid Panel     Component Value Date/Time   CHOL 139 05/24/2010 0853   TRIG 87.0 05/24/2010 0853   HDL 51.30 05/24/2010 0853   CHOLHDL 3 05/24/2010 0853   VLDL 17.4 05/24/2010 0853   LDLCALC 70 05/24/2010 0853    CBC    Component Value Date/Time   WBC 6.2 05/12/2008 1449   RBC 4.06* 05/12/2008 1449   HGB 13.6 05/12/2008 1449   HCT 39.1 05/12/2008 1449   PLT 189 05/12/2008 1449   MCV 96.2 05/12/2008 1449   MCHC 34.8 05/12/2008 1449   RDW 13.2 05/12/2008 1449   MONOABS 0.7 05/12/2008 1449   EOSABS 0.1 05/12/2008 1449   BASOSABS 0.0 05/12/2008 1449

## 2012-05-08 NOTE — Assessment & Plan Note (Signed)
Asymptomatic but we'll decrease his Toprol to 25 mg per day.

## 2012-08-22 ENCOUNTER — Other Ambulatory Visit: Payer: Self-pay | Admitting: *Deleted

## 2012-08-22 DIAGNOSIS — Z48812 Encounter for surgical aftercare following surgery on the circulatory system: Secondary | ICD-10-CM

## 2012-08-22 DIAGNOSIS — I6529 Occlusion and stenosis of unspecified carotid artery: Secondary | ICD-10-CM

## 2012-08-26 ENCOUNTER — Encounter: Payer: Self-pay | Admitting: Neurosurgery

## 2012-08-27 ENCOUNTER — Ambulatory Visit (INDEPENDENT_AMBULATORY_CARE_PROVIDER_SITE_OTHER): Payer: Medicare Other | Admitting: Neurosurgery

## 2012-08-27 ENCOUNTER — Other Ambulatory Visit: Payer: Self-pay | Admitting: *Deleted

## 2012-08-27 ENCOUNTER — Encounter: Payer: Self-pay | Admitting: Neurosurgery

## 2012-08-27 ENCOUNTER — Other Ambulatory Visit (INDEPENDENT_AMBULATORY_CARE_PROVIDER_SITE_OTHER): Payer: Medicare Other | Admitting: *Deleted

## 2012-08-27 VITALS — BP 132/81 | HR 61 | Resp 20 | Ht 68.0 in | Wt 182.0 lb

## 2012-08-27 DIAGNOSIS — I6529 Occlusion and stenosis of unspecified carotid artery: Secondary | ICD-10-CM

## 2012-08-27 DIAGNOSIS — Z48812 Encounter for surgical aftercare following surgery on the circulatory system: Secondary | ICD-10-CM

## 2012-08-27 NOTE — Progress Notes (Signed)
[facility] Carotid Office Note  CC: Carotid surveillance Referring Physician: Early  History of Present Illness: 79 year old male patient of [name] status post left CEA in April 2008. The patient denies any signs or symptoms of CVA, TIA, amaurosis fugax or any neural deficit. The patient denies any new medical diagnoses or recent surgery.  Past Medical History  Diagnosis Date  . PREMATURE VENTRICULAR CONTRACTIONS   . HYPERTENSION, UNSPECIFIED   . HYPERLIPIDEMIA-MIXED   . CAROTID ARTERY STENOSIS, WITHOUT INFARCTION   . CAD, NATIVE VESSEL   . AV BLOCK, 1ST DEGREE   . Old myocardial infarction     02/1992  . GERD (gastroesophageal reflux disease)   . Stroke   . TIA (transient ischemic attack)     ROS: [x]  Positive   [ ]  Denies    General: [ ]  Weight loss, [ ]  Fever, [ ]  chills Neurologic: [ ]  Dizziness, [ ]  Blackouts, [ ]  Seizure [ ]  Stroke, [ ]  "Mini stroke", [ ]  Slurred speech, [ ]  Temporary blindness; [ ]  weakness in arms or legs, [ ]  Hoarseness Cardiac: [ ]  Chest pain/pressure, [ ]  Shortness of breath at rest [ ]  Shortness of breath with exertion, [ ]  Atrial fibrillation or irregular heartbeat Vascular: [ ]  Pain in legs with walking, [ ]  Pain in legs at rest, [ ]  Pain in legs at night,  [ ]  Non-healing ulcer, [ ]  Blood clot in vein/DVT,   Pulmonary: [ ]  Home oxygen, [ ]  Productive cough, [ ]  Coughing up blood, [ ]  Asthma,  [ ]  Wheezing Musculoskeletal:  [ ]  Arthritis, [ ]  Low back pain, [ ]  Joint pain Hematologic: [ ]  Easy Bruising, [ ]  Anemia; [ ]  Hepatitis Gastrointestinal: [ ]  Blood in stool, [ ]  Gastroesophageal Reflux/heartburn, [ ]  Trouble swallowing Urinary: [ ]  chronic Kidney disease, [ ]  on HD - [ ]  MWF or [ ]  TTHS, [ ]  Burning with urination, [ ]  Difficulty urinating Skin: [ ]  Rashes, [ ]  Wounds Psychological: [ ]  Anxiety, [ ]  Depression   Social History History  Substance Use Topics  . Smoking status: Never Smoker   . Smokeless  tobacco: Never Used  . Alcohol Use: 12.6 oz/week    21 Glasses of wine per week    Family History Family History  Problem Relation Age of Onset  . Diabetes Mother   . Diabetes Sister     No Known Allergies  Current Outpatient Prescriptions  Medication Sig Dispense Refill  . amLODipine (NORVASC) 5 MG tablet Take 10 mg by mouth daily.       Marland Kitchen aspirin 325 MG tablet Take 325 mg by mouth daily.        Marland Kitchen b complex vitamins tablet Take 1 tablet by mouth daily.      . Calcium Carbonate-Vit D-Min (CALCIUM 600 + MINERALS) 600-200 MG-UNIT TABS Take by mouth daily.      . Glucosamine 500 MG CAPS Take by mouth daily.      . hydrochlorothiazide (MICROZIDE) 12.5 MG capsule Take 1 capsule (12.5 mg total) by mouth daily.  90 capsule  3  . lisinopril (PRINIVIL,ZESTRIL) 40 MG tablet Take 1 tablet (40 mg total) by mouth daily.  90 tablet  3  . metoprolol succinate (TOPROL-XL) 25 MG 24 hr tablet Take 1 tablet (25 mg total) by mouth daily.  90 tablet  3  . Multiple Vitamins-Minerals (MULTIVITAMIN WITH MINERALS) tablet Take 1 tablet by mouth daily.        Marland Kitchen  Omega-3 Fatty Acids (FISH OIL) 300 MG CAPS Take by mouth daily.      . simvastatin (ZOCOR) 20 MG tablet Take 1 tablet (20 mg total) by mouth at bedtime.  90 tablet  3  . vitamin C (ASCORBIC ACID) 500 MG tablet Take 500 mg by mouth daily.        . vitamin D, CHOLECALCIFEROL, 400 UNITS tablet Take 400 Units by mouth daily.      . vitamin E 100 UNIT capsule Take 100 Units by mouth daily.      . Calcium Carbonate (CALCIUM 500 PO) Take 1 tablet by mouth daily.      . magnesium 30 MG tablet Take 30 mg by mouth daily.          Physical Examination  Filed Vitals:   08/27/12 1445  BP: 132/81  Pulse: 61  Resp:     Body mass index is 27.67 kg/(m^2).  General:  WDWN in NAD Gait: Normal HEENT: WNL Eyes: Pupils equal Pulmonary: normal non-labored breathing , without Rales, rhonchi,  wheezing Cardiac: RRR, without  Murmurs, rubs or gallops; Abdomen:  soft, NT, no masses Skin: no rashes, ulcers noted  Vascular Exam Pulses: 3+ radial pulses bilaterally Carotid bruits: Carotid pulses to auscultation no bruits are heard Extremities without ischemic changes, no Gangrene , no cellulitis; no open wounds;  Musculoskeletal: no muscle wasting or atrophy   Neurologic: A&O X 3; Appropriate Affect ; SENSATION: normal; MOTOR FUNCTION:  moving all extremities equally. Speech is fluent/normal  Non-Invasive Vascular Imaging CAROTID DUPLEX 08/27/2012  Right ICA 20 - 39 % stenosis Left ICA 0 - 19% stenosis   ASSESSMENT/PLAN: Asymptomatic patient with no significant change in prior exam. The patient will followup in one year with repeat carotid duplex. The patient's questions were encouraged and answered, he is in agreement with this plan.  [name] ANP   Clinic MD: Edilia Bo

## 2013-01-09 ENCOUNTER — Other Ambulatory Visit: Payer: Self-pay | Admitting: Cardiology

## 2013-02-14 ENCOUNTER — Other Ambulatory Visit: Payer: Self-pay | Admitting: Cardiology

## 2013-07-08 ENCOUNTER — Other Ambulatory Visit: Payer: Self-pay | Admitting: Neurosurgery

## 2013-07-08 DIAGNOSIS — I6529 Occlusion and stenosis of unspecified carotid artery: Secondary | ICD-10-CM

## 2013-07-08 DIAGNOSIS — Z48812 Encounter for surgical aftercare following surgery on the circulatory system: Secondary | ICD-10-CM

## 2013-07-14 ENCOUNTER — Encounter: Payer: Self-pay | Admitting: Cardiovascular Disease

## 2013-07-14 ENCOUNTER — Ambulatory Visit (INDEPENDENT_AMBULATORY_CARE_PROVIDER_SITE_OTHER): Payer: Medicare Other | Admitting: Cardiovascular Disease

## 2013-07-14 VITALS — BP 120/60 | HR 62 | Ht 68.0 in | Wt 174.0 lb

## 2013-07-14 DIAGNOSIS — I440 Atrioventricular block, first degree: Secondary | ICD-10-CM

## 2013-07-14 DIAGNOSIS — I2510 Atherosclerotic heart disease of native coronary artery without angina pectoris: Secondary | ICD-10-CM

## 2013-07-14 MED ORDER — NITROGLYCERIN 0.4 MG SL SUBL: 0.4000 mg | SUBLINGUAL_TABLET | SUBLINGUAL | Status: DC | PRN

## 2013-07-14 NOTE — Assessment & Plan Note (Signed)
No change from a year ago.  No high grade AV block  Stable Avoid beta blockers

## 2013-07-14 NOTE — Patient Instructions (Signed)
Your physician wants you to follow-up in: YEAR WITH DR NISHAN  You will receive a reminder letter in the mail two months in advance. If you don't receive a letter, please call our office to schedule the follow-up appointment.  Your physician recommends that you continue on your current medications as directed. Please refer to the Current Medication list given to you today. 

## 2013-07-14 NOTE — Assessment & Plan Note (Signed)
Cholesterol is at goal.  Continue current dose of statin and diet Rx.  No myalgias or side effects.  F/U  LFT's in 6 months. Lab Results  Component Value Date   LDLCALC 70 05/24/2010

## 2013-07-14 NOTE — Progress Notes (Signed)
Patient ID: [name], male   DOB: [birth year], 79 y.o.   MRN: [id number] 79 yo patient of Dr Daleen Squibb I have not seen before.  Has CAD  Last cath 2009    Vascular disease with previous LCEA  Last duplex in January with patent CEA and no significant right sided disease.    05/14/2008:  Cardiac Cath Findings: RESULTS: Hemodynamics, LV 163/26, Ao 169/108. Coronaries, left main  had luminal irregularities. The LAD was moderately to severely  calcified. There was long proximal 50% stenosis. There were focal mid  tandem 40% lesions. There was diffuse 25% stenosis in the mid and  distal vessel. The apex was subtotally stenosed. It was a large vessel  wrapping the apex. There is a small first diagonal which was branching  and diffusely diseased with subtotal stenosis. The circumflex was  heavily calcified. It was occluded after the ramus intermediate. There  were some bridging collaterals with faint filling of the distal  circumflex and OM. It appeared to be a large vessel. The ramus  intermediate was a very large vessel. There were proximal tandem 30%  lesions. There was mid long 40% stenosis. There was a mid 70% stenosis  following this. The right coronary artery was a dominant vessel. It  was moderately calcified. There was long mid 70-80% stenosis. There  was distal 60% stenosis before the PDA. Posterolateral had small  luminal irregularities. The posterolateral was small with luminal  irregularities. The PDA had long proximal 30-40% stenosis. The EF was  65% and normal.  CONCLUSION:  1. Diffuse coronary plaque.  2. Preserved left ventricular function.   Active no chest pain Needs nitro No TIAls  Compliant with meds.  Retired from [employer] and went to [employer].  Was in computer electronics Had AAA screening at [facility] and no aneurysm  ROS: Denies fever, malais, weight loss, blurry vision, decreased visual acuity, cough, sputum, SOB, hemoptysis, pleuritic pain, palpitaitons, heartburn, abdominal  pain, melena, lower extremity edema, claudication, or rash.  All other systems reviewed and negative  General: Affect appropriate Healthy:  appears stated age HEENT: normal Neck supple with no adenopathy JVP normal Prev LCEA and right  bruits no thyromegaly Lungs clear with no wheezing and good diaphragmatic motion Heart:  S1/S2 no murmur, no rub, gallop or click PMI normal Abdomen: benighn, BS positve, no tenderness, no AAA no bruit.  No HSM or HJR Distal pulses intact with no bruits No edema Neuro non-focal Skin warm and dry No muscular weakness   Current Outpatient Prescriptions  Medication Sig Dispense Refill  . amLODipine (NORVASC) 5 MG tablet Take 10 mg by mouth daily.       Marland Kitchen aspirin 325 MG tablet Take 325 mg by mouth daily.        Marland Kitchen b complex vitamins tablet Take 1 tablet by mouth daily.      . Calcium Carbonate (CALCIUM 500 PO) Take 1 tablet by mouth daily.      . Glucosamine 500 MG CAPS Take by mouth daily.      . hydrochlorothiazide (MICROZIDE) 12.5 MG capsule TAKE 1 CAPSULE DAILY  90 capsule  2  . lisinopril (PRINIVIL,ZESTRIL) 40 MG tablet TAKE 1 TABLET DAILY  90 tablet  2  . metoprolol succinate (TOPROL-XL) 25 MG 24 hr tablet TAKE 1 TABLET DAILY  90 tablet  2  . Multiple Vitamins-Minerals (MULTIVITAMIN WITH MINERALS) tablet Take 1 tablet by mouth daily.        . Omega-3 Fatty Acids (FISH OIL) 300  MG CAPS Take by mouth daily.      . simvastatin (ZOCOR) 20 MG tablet TAKE 1 TABLET AT BEDTIME  90 tablet  2  . vitamin C (ASCORBIC ACID) 500 MG tablet Take 500 mg by mouth daily.        . vitamin D, CHOLECALCIFEROL, 400 UNITS tablet Take 400 Units by mouth daily.      . vitamin E 100 UNIT capsule Take 100 Units by mouth daily.       No current facility-administered medications for this visit.    Allergies  Review of patient's allergies indicates no known allergies.  Electrocardiogram:  SR rate 62 PR 296  Otherwise normal   Assessment and Plan

## 2013-07-14 NOTE — Assessment & Plan Note (Signed)
Not sure he needs duplex every year with no disease on right and wide open CEA  F/U [facility] ? Every 2 year duplex  ASA

## 2013-07-14 NOTE — Assessment & Plan Note (Signed)
Well controlled.  Continue current medications and low sodium Dash type diet.    

## 2013-08-31 ENCOUNTER — Other Ambulatory Visit: Payer: Self-pay | Admitting: Cardiology

## 2013-09-01 ENCOUNTER — Other Ambulatory Visit

## 2013-09-01 ENCOUNTER — Ambulatory Visit: Admitting: Neurosurgery

## 2013-09-14 ENCOUNTER — Ambulatory Visit: Admitting: Family

## 2013-09-14 ENCOUNTER — Other Ambulatory Visit (HOSPITAL_COMMUNITY)

## 2013-09-15 ENCOUNTER — Other Ambulatory Visit (HOSPITAL_COMMUNITY)

## 2013-09-15 ENCOUNTER — Ambulatory Visit: Admitting: Family

## 2013-09-30 ENCOUNTER — Encounter: Payer: Self-pay | Admitting: Family

## 2013-10-01 ENCOUNTER — Ambulatory Visit (HOSPITAL_COMMUNITY)
Admission: RE | Admit: 2013-10-01 | Discharge: 2013-10-01 | Disposition: A | Discharge status: Home / Self Care | Payer: Medicare Other | Source: Ambulatory Visit | Attending: Family | Admitting: Family

## 2013-10-01 ENCOUNTER — Encounter: Payer: Self-pay | Admitting: Family

## 2013-10-01 ENCOUNTER — Ambulatory Visit (INDEPENDENT_AMBULATORY_CARE_PROVIDER_SITE_OTHER): Payer: Medicare Other | Admitting: Family

## 2013-10-01 VITALS — BP 142/73 | HR 52 | Resp 24 | Ht 68.0 in | Wt 176.2 lb

## 2013-10-01 DIAGNOSIS — Z9889 Other specified postprocedural states: Secondary | ICD-10-CM | POA: Insufficient documentation

## 2013-10-01 DIAGNOSIS — I6529 Occlusion and stenosis of unspecified carotid artery: Secondary | ICD-10-CM

## 2013-10-01 DIAGNOSIS — Z48812 Encounter for surgical aftercare following surgery on the circulatory system: Secondary | ICD-10-CM

## 2013-10-01 NOTE — Progress Notes (Signed)
Established Carotid Patient   History of Present Illness  [name] is a 80 y.o. male patient of Dr. Donnetta Hutching status post left CEA in April 2008. He returns today for follow up. He was hospitalized at [facility] in 2008, prior to the CEA, for right hand shaking, he was told that he had a mild stroke.  Patient has Negative history of TIA or stroke symptom.  The patient denies amaurosis fugax or monocular blindness.  The patient  denies facial drooping.  Pt. denies hemiplegia.  The patient denies receptive or expressive aphasia.  Pt. denies extremity weakness.   Patient denies New Medical or Surgical History. He walks about 30 minutes daily. He denies claudication symptoms, denies non-healing wounds.  Pt Diabetic: No Pt smoker: non-smoker  Pt meds include: Statin : Yes ASA: Yes Other anticoagulants/antiplatelets: no   Past Medical History  Diagnosis Date  . PREMATURE VENTRICULAR CONTRACTIONS   . HYPERTENSION, UNSPECIFIED   . HYPERLIPIDEMIA-MIXED   . CAROTID ARTERY STENOSIS, WITHOUT INFARCTION   . CAD, NATIVE VESSEL   . AV BLOCK, 1ST DEGREE   . Old myocardial infarction     02/1992  . GERD (gastroesophageal reflux disease)   . Stroke   . TIA (transient ischemic attack)     Social History History  Substance Use Topics  . Smoking status: Never Smoker   . Smokeless tobacco: Never Used  . Alcohol Use: 9.0 oz/week    15 Glasses of wine per week    Family History Family History  Problem Relation Age of Onset  . Diabetes Mother   . Diabetes Sister   . Cancer Sister 70    colon  . Heart disease Sister     Surgical History Past Surgical History  Procedure Laterality Date  . Hernia repair    . Tonsillectomy    . Cardiac catheterization      x2  . Carotid endarterectomy  11/28/2006    left    No Known Allergies  Current Outpatient Prescriptions  Medication Sig Dispense Refill  . amLODipine (NORVASC) 10 MG tablet TAKE 1 TABLET DAILY  90 tablet  1  . aspirin  325 MG tablet Take 325 mg by mouth daily.        Marland Kitchen b complex vitamins tablet Take 1 tablet by mouth daily.      . Calcium Carbonate (CALCIUM 500 PO) Take 1 tablet by mouth daily.      . Glucosamine 500 MG CAPS Take by mouth daily.      . hydrochlorothiazide (MICROZIDE) 12.5 MG capsule TAKE 1 CAPSULE DAILY  90 capsule  1  . ibuprofen (ADVIL,MOTRIN) 200 MG tablet Take 200 mg by mouth every 6 (six) hours as needed.      Marland Kitchen lisinopril (PRINIVIL,ZESTRIL) 40 MG tablet TAKE 1 TABLET DAILY  90 tablet  2  . metoprolol succinate (TOPROL-XL) 25 MG 24 hr tablet TAKE 1 TABLET DAILY  90 tablet  2  . Multiple Vitamins-Minerals (MULTIVITAMIN WITH MINERALS) tablet Take 1 tablet by mouth daily.        . nitroGLYCERIN (NITROSTAT) 0.4 MG SL tablet Place 1 tablet (0.4 mg total) under the tongue every 5 (five) minutes as needed for chest pain.  25 tablet  4  . Omega-3 Fatty Acids (FISH OIL) 300 MG CAPS Take by mouth daily.      . simvastatin (ZOCOR) 20 MG tablet TAKE 1 TABLET AT BEDTIME  90 tablet  1  . vitamin C (ASCORBIC ACID) 500 MG tablet Take  500 mg by mouth daily.        . vitamin D, CHOLECALCIFEROL, 400 UNITS tablet Take 400 Units by mouth daily.      . vitamin E 100 UNIT capsule Take 100 Units by mouth daily.       No current facility-administered medications for this visit.    Review of Systems : See HPI for pertinent positives and negatives.  Physical Examination   Filed Vitals:   10/01/13 1218  BP: 142/73  Pulse:   Resp:    Filed Weights   10/01/13 1217  Weight: 176 lb 3.2 oz (79.924 kg)   Body mass index is 26.8 kg/(m^2).   General: WDWN male in NAD GAIT: normal Eyes: PERRLA Pulmonary:  Non-labored, CTAB, Negative  Rales, Negative rhonchi, & Negative wheezing.  Cardiac: regular Rhythm ,  Negative detected murmur.  VASCULAR EXAM Carotid Bruits Left Right   Negative Negative    Aorta is not palpable. Radial pulses are 2+ palpable and equal.                                                                                                                             LE Pulses LEFT RIGHT       POPLITEAL  not palpable   not palpable    Gastrointestinal: soft, nontender, BS WNL, no r/g,  negative masses.  Musculoskeletal: Negative muscle atrophy/wasting. M/S 5/5 throughout, Extremities without ischemic changes.  Neurologic: A&O X 3; Appropriate Affect ; SENSATION ;normal;  Speech is normal CN 2-12 intact except fine tremor in both outstretched hands, pill rolling movement noted in right hand at rest, Pain and light touch intact in extremities, Motor exam as listed above.   Non-Invasive Vascular Imaging CAROTID DUPLEX 10/01/2013   CEREBROVASCULAR DUPLEX EVALUATION    INDICATION: Follow-up carotid disease     PREVIOUS INTERVENTION(S): Left carotid endarterectomy 11/28/2006    DUPLEX EXAM:     RIGHT  LEFT  Peak Systolic Velocities (cm/s) End Diastolic Velocities (cm/s) Plaque LOCATION Peak Systolic Velocities (cm/s) End Diastolic Velocities (cm/s) Plaque  96 11  CCA PROXIMAL 121 17   70 11  CCA MID 72 16   50 9  CCA DISTAL 56 13   247 0 CP ECA 122 0   86 19  ICA PROXIMAL 77 23   80 21  ICA MID 98 28   87 19  ICA DISTAL 78 25     NA ICA / CCA Ratio (PSV) 1.4  Antegrade  Vertebral Flow Antegrade   123456 Brachial Systolic Pressure (mmHg) 99991111  Within normal limits  Brachial Artery Waveforms Within normal limits     Plaque Morphology:  HM = Homogeneous, HT = Heterogeneous, CP = Calcific Plaque, SP = Smooth Plaque, IP = Irregular Plaque     ADDITIONAL FINDINGS:     IMPRESSION: 1. Evidence of <40% stenosis of the right internal carotid artery. Plaque is calcific and Doppler cannot penetrate acoustic shadow which may underestimate  disease; however, there is minimal spectral broadening in waveforms immediately distal to shadow. 2. Widely patent left carotid endarterectomy without evidence of restenosis or hyperplasia.  3. Bilateral vertebral artery is antegrade.     Compared to the previous exam:  No significant change compared to prior exam.     Assessment: [name] is a 80 y.o. male who presents with asymptomatic minimal stenosis of the right internal carotid artery. Plaque is calcific. Widely patent left carotid endarterectomy without evidence of restenosis or hyperplasia. The  ICA stenosis is  Unchanged from previous exam.  Plan: Follow-up in 1 year with Carotid Duplex scan.   I discussed in depth with the patient the nature of atherosclerosis, and emphasized the importance of maximal medical management including strict control of blood pressure, blood glucose, and lipid levels, obtaining regular exercise, and continued cessation of smoking.  The patient is aware that without maximal medical management the underlying atherosclerotic disease process will progress, limiting the benefit of any interventions. The patient was given information about stroke prevention and what symptoms should prompt the patient to seek immediate medical care. Thank you for allowing [place] to participate in this patient's care.  Clemon Chambers, RN, MSN, FNP-C [facility] Office: [phone]  Clinic Physician: Oneida Alar  10/01/2013 12:42 PM

## 2013-10-01 NOTE — Patient Instructions (Signed)

## 2013-10-02 NOTE — Addendum Note (Signed)
Addended by: Dorthula Rue L on: 10/02/2013 11:26 AM   Modules accepted: Orders

## 2013-10-07 ENCOUNTER — Other Ambulatory Visit: Payer: Self-pay | Admitting: Cardiology

## 2013-11-19 ENCOUNTER — Other Ambulatory Visit: Payer: Self-pay | Admitting: Cardiology

## 2014-01-24 ENCOUNTER — Other Ambulatory Visit: Payer: Self-pay | Admitting: Cardiovascular Disease

## 2014-03-01 ENCOUNTER — Other Ambulatory Visit: Payer: Self-pay | Admitting: Cardiovascular Disease

## 2014-04-12 ENCOUNTER — Other Ambulatory Visit: Payer: Self-pay | Admitting: Cardiovascular Disease

## 2014-05-12 ENCOUNTER — Other Ambulatory Visit: Payer: Self-pay | Admitting: Cardiovascular Disease

## 2014-06-19 ENCOUNTER — Other Ambulatory Visit: Payer: Self-pay | Admitting: Cardiovascular Disease

## 2014-06-25 ENCOUNTER — Other Ambulatory Visit: Payer: Self-pay

## 2014-06-25 MED ORDER — LISINOPRIL 40 MG PO TABS: ORAL_TABLET | ORAL | Status: DC

## 2014-07-23 ENCOUNTER — Other Ambulatory Visit: Payer: Self-pay | Admitting: Cardiovascular Disease

## 2014-07-24 NOTE — Telephone Encounter (Signed)
Rx was sent to pharmacy electronically. 

## 2014-08-30 ENCOUNTER — Other Ambulatory Visit: Payer: Self-pay | Admitting: Cardiovascular Disease

## 2014-09-01 ENCOUNTER — Other Ambulatory Visit: Payer: Self-pay | Admitting: Cardiovascular Disease

## 2014-09-04 ENCOUNTER — Other Ambulatory Visit: Payer: Self-pay | Admitting: Cardiovascular Disease

## 2014-10-01 ENCOUNTER — Encounter: Payer: Self-pay | Admitting: Family

## 2014-10-04 ENCOUNTER — Other Ambulatory Visit: Payer: Self-pay | Admitting: Cardiovascular Disease

## 2014-10-05 ENCOUNTER — Ambulatory Visit (HOSPITAL_COMMUNITY)
Admission: RE | Admit: 2014-10-05 | Discharge: 2014-10-05 | Disposition: A | Discharge status: Home / Self Care | Payer: Medicare Other | Source: Ambulatory Visit | Attending: Family | Admitting: Family

## 2014-10-05 ENCOUNTER — Ambulatory Visit (INDEPENDENT_AMBULATORY_CARE_PROVIDER_SITE_OTHER): Payer: Medicare Other | Admitting: Family

## 2014-10-05 ENCOUNTER — Encounter: Payer: Self-pay | Admitting: Family

## 2014-10-05 VITALS — BP 133/69 | HR 53 | Resp 16 | Ht 68.0 in | Wt 175.0 lb

## 2014-10-05 DIAGNOSIS — I6521 Occlusion and stenosis of right carotid artery: Secondary | ICD-10-CM | POA: Insufficient documentation

## 2014-10-05 DIAGNOSIS — Z48812 Encounter for surgical aftercare following surgery on the circulatory system: Secondary | ICD-10-CM

## 2014-10-05 DIAGNOSIS — I6523 Occlusion and stenosis of bilateral carotid arteries: Secondary | ICD-10-CM

## 2014-10-05 DIAGNOSIS — Z9889 Other specified postprocedural states: Secondary | ICD-10-CM | POA: Insufficient documentation

## 2014-10-05 NOTE — Progress Notes (Signed)
Established Carotid Patient   History of Present Illness  [name] is a 81 y.o. male patient of Dr. Donnetta Hutching who is status post left CEA in April 2008. He returns today for follow up. He was hospitalized at [facility] in 2008, prior to the CEA, for right hand shaking, he was told that he had a mild stroke. The patient denies any history of other TIA or stroke symptoms other than the right hand shaking, specifically the patient denies a history of amaurosis fugax or monocular blindness, denies a history unilateral  of facial drooping, denies a history of hemiplegia, and denies a history of receptive or expressive aphasia.    Pt states he is scheduled to see his cardiologist, Dr. Johnsie Cancel, on 10/07/14.  Patient denies New Medical or Surgical History. He walks about 30 minutes daily. He denies claudication symptoms, denies non-healing wounds.  Pt Diabetic: No Pt smoker: non-smoker  Pt meds include: Statin : Yes ASA: Yes Other anticoagulants/antiplatelets: no    Past Medical History  Diagnosis Date  . PREMATURE VENTRICULAR CONTRACTIONS   . HYPERTENSION, UNSPECIFIED   . HYPERLIPIDEMIA-MIXED   . CAROTID ARTERY STENOSIS, WITHOUT INFARCTION   . CAD, NATIVE VESSEL   . AV BLOCK, 1ST DEGREE   . Old myocardial infarction     02/1992  . GERD (gastroesophageal reflux disease)   . Stroke   . TIA (transient ischemic attack)     Social History History  Substance Use Topics  . Smoking status: Never Smoker   . Smokeless tobacco: Never Used  . Alcohol Use: 9.0 oz/week    15 Glasses of wine per week    Family History Family History  Problem Relation Age of Onset  . Diabetes Mother   . Diabetes Sister   . Cancer Sister 26    colon  . Heart disease Sister     Surgical History Past Surgical History  Procedure Laterality Date  . Hernia repair    . Tonsillectomy    . Cardiac catheterization      x2  . Carotid endarterectomy  11/28/2006    left    No Known  Allergies  Current Outpatient Prescriptions  Medication Sig Dispense Refill  . amLODipine (NORVASC) 10 MG tablet TAKE 1 TABLET DAILY (NEED TO CONTACT OFFICE TO SCHEDULE APPOINTMENT FOR FUTURE REFILLS, [phone]) 30 tablet 0  . aspirin 325 MG tablet Take 325 mg by mouth daily.      Marland Kitchen b complex vitamins tablet Take 1 tablet by mouth daily.    . Calcium Carbonate (CALCIUM 500 PO) Take 1 tablet by mouth daily.    . Glucosamine 500 MG CAPS Take by mouth daily.    . hydrochlorothiazide (MICROZIDE) 12.5 MG capsule TAKE 1 CAPSULE DAILY 30 capsule 0  . ibuprofen (ADVIL,MOTRIN) 200 MG tablet Take 200 mg by mouth every 6 (six) hours as needed.    Marland Kitchen lisinopril (PRINIVIL,ZESTRIL) 40 MG tablet TAKE 1 TABLET DAILY (PATIENT NEEDS TO CONTACT OFFICE TO SCHEDULE APPOINTMENT FOR FUTURE REFILLS. PHONE: [phone]) 30 tablet 0  . metoprolol succinate (TOPROL-XL) 25 MG 24 hr tablet TAKE 1 TABLET DAILY (NEED APPOINTMENT FOR FUTURE REFILLS) 90 tablet 1  . Multiple Vitamins-Minerals (MULTIVITAMIN WITH MINERALS) tablet Take 1 tablet by mouth daily.      . nitroGLYCERIN (NITROSTAT) 0.4 MG SL tablet Place 1 tablet (0.4 mg total) under the tongue every 5 (five) minutes as needed for chest pain. 25 tablet 4  . Omega-3 Fatty Acids (FISH OIL) 300 MG CAPS Take by  mouth daily.    . simvastatin (ZOCOR) 20 MG tablet TAKE 1 TABLET AT BEDTIME (NEED TO CONTACT OFFICE TO SCHEDULE APPOINTMENT FOR FUTURE REFILLS, PHONE NUMBER [phone]) 30 tablet 0  . vitamin C (ASCORBIC ACID) 500 MG tablet Take 500 mg by mouth daily.      . vitamin D, CHOLECALCIFEROL, 400 UNITS tablet Take 400 Units by mouth daily.    . vitamin E 100 UNIT capsule Take 100 Units by mouth daily.     No current facility-administered medications for this visit.    Review of Systems : See HPI for pertinent positives and negatives.  Physical Examination  Filed Vitals:   10/05/14 1055 10/05/14 1059  BP: 143/76 133/69  Pulse: 59 53  Resp:  16  Height:  5\' 8"   (1.727 m)  Weight:  175 lb (79.379 kg)  SpO2:  98%   Body mass index is 26.61 kg/(m^2).   General: WDWN male in NAD GAIT: normal Eyes: PERRLA Pulmonary: Non-labored, CTAB, Negative Rales, Negative rhonchi, & Negative wheezing.  Cardiac: regular Rhythm, no detected murmur.  VASCULAR EXAM Carotid Bruits Left Right   Negative Negative   Aorta is not palpable. Radial pulses are 2+ palpable and equal.      LE Pulses LEFT RIGHT   POPLITEAL not palpable  not palpable    Gastrointestinal: soft, nontender, BS WNL, no r/g, no palpable masses.  Musculoskeletal: Negative muscle atrophy/wasting. M/S 5/5 throughout, Extremities without ischemic changes.  Neurologic: A&O X 3; Appropriate Affect, Speech is normal CN 2-12 intact except fine tremor in both outstretched hands, pill rolling movement noted in right hand at rest, Pain and light touch intact in extremities, Motor exam as listed above.          Non-Invasive Vascular Imaging CAROTID DUPLEX 10/05/2014   CEREBROVASCULAR DUPLEX EVALUATION    INDICATION: Carotid endarterectomy     PREVIOUS INTERVENTION(S): Left carotid endarterectomy on 11/28/06    DUPLEX EXAM:     RIGHT  LEFT  Peak Systolic Velocities (cm/s) End Diastolic Velocities (cm/s) Plaque LOCATION Peak Systolic Velocities (cm/s) End Diastolic Velocities (cm/s) Plaque  133 9  CCA PROXIMAL 88 11   64 10  CCA MID 59 11   46 9  CCA DISTAL 52 8   304 18 CP ECA 160 7   104 24 CP ICA PROXIMAL 90 25   91 19  ICA MID 71 22   53 15  ICA DISTAL 72 20     2.3 ICA / CCA Ratio (PSV) Not Calculated  Antegrade Vertebral Flow Antegrade   Brachial Systolic Pressure (mmHg)   Multiphasic (subclavian artery) Brachial Artery Waveforms Multiphasic (subclavian artery)    Plaque Morphology:  HM = Homogeneous, HT = Heterogeneous,  CP = Calcific Plaque, SP = Smooth Plaque, IP = Irregular Plaque     ADDITIONAL FINDINGS: . No significant stenosis of the left external or bilateral common carotid arteries. . The right external carotid artery stenosis noted.    IMPRESSION: 1. Patent left carotid endarterectomy site with no left internal carotid artery stenosis. 2. Doppler velocities suggest a less than 40% stenosis of the right proximal internal carotid artery.    Compared to the previous exam:  No significant change noted when compared to the previous exam on 10/01/13.       Assessment: Sian Joles is a 81 y.o. male who is status post left CEA in April 2008. He presents with asymptomatic patent left carotid endarterectomy site with no left internal carotid artery stenosis  and less than 40% stenosis of the right proximal internal carotid artery. No significant change noted when compared to the previous Duplex on 10/01/13.  Plan: Follow-up in 1 year with Carotid Duplex.   I discussed in depth with the patient the nature of atherosclerosis, and emphasized the importance of maximal medical management including strict control of blood pressure, blood glucose, and lipid levels, obtaining regular exercise, and continued cessation of smoking.  The patient is aware that without maximal medical management the underlying atherosclerotic disease process will progress, limiting the benefit of any interventions. The patient was given information about stroke prevention and what symptoms should prompt the patient to seek immediate medical care. Thank you for allowing [place] to participate in this patient's care.  Clemon Chambers, RN, MSN, FNP-C [facility] Office: [phone]  Clinic Physician: Early  10/05/2014 10:34 AM

## 2014-10-05 NOTE — Addendum Note (Signed)
Addended by: Mena Goes on: 10/05/2014 12:01 PM   Modules accepted: Orders

## 2014-10-05 NOTE — Patient Instructions (Signed)
Stroke Prevention Some medical conditions and behaviors are associated with an increased chance of having a stroke. You may prevent a stroke by making healthy choices and managing medical conditions. HOW CAN I REDUCE MY RISK OF HAVING A STROKE?   Stay physically active. Get at least 30 minutes of activity on most or all days.  Do not smoke. It may also be helpful to avoid exposure to secondhand smoke.  Limit alcohol use. Moderate alcohol use is considered to be:  No more than 2 drinks per day for men.  No more than 1 drink per day for nonpregnant women.  Eat healthy foods. This involves:  Eating 5 or more servings of fruits and vegetables a day.  Making dietary changes that address high blood pressure (hypertension), high cholesterol, diabetes, or obesity.  Manage your cholesterol levels.  Making food choices that are high in fiber and low in saturated fat, trans fat, and cholesterol may control cholesterol levels.  Take any prescribed medicines to control cholesterol as directed by your health care provider.  Manage your diabetes.  Controlling your carbohydrate and sugar intake is recommended to manage diabetes.  Take any prescribed medicines to control diabetes as directed by your health care provider.  Control your hypertension.  Making food choices that are low in salt (sodium), saturated fat, trans fat, and cholesterol is recommended to manage hypertension.  Take any prescribed medicines to control hypertension as directed by your health care provider.  Maintain a healthy weight.  Reducing calorie intake and making food choices that are low in sodium, saturated fat, trans fat, and cholesterol are recommended to manage weight.  Stop drug abuse.  Avoid taking birth control pills.  Talk to your health care provider about the risks of taking birth control pills if you are over 35 years old, smoke, get migraines, or have ever had a blood clot.  Get evaluated for sleep  disorders (sleep apnea).  Talk to your health care provider about getting a sleep evaluation if you snore a lot or have excessive sleepiness.  Take medicines only as directed by your health care provider.  For some people, aspirin or blood thinners (anticoagulants) are helpful in reducing the risk of forming abnormal blood clots that can lead to stroke. If you have the irregular heart rhythm of atrial fibrillation, you should be on a blood thinner unless there is a good reason you cannot take them.  Understand all your medicine instructions.  Make sure that other conditions (such as anemia or atherosclerosis) are addressed. SEEK IMMEDIATE MEDICAL CARE IF:   You have sudden weakness or numbness of the face, arm, or leg, especially on one side of the body.  Your face or eyelid droops to one side.  You have sudden confusion.  You have trouble speaking (aphasia) or understanding.  You have sudden trouble seeing in one or both eyes.  You have sudden trouble walking.  You have dizziness.  You have a loss of balance or coordination.  You have a sudden, severe headache with no known cause.  You have new chest pain or an irregular heartbeat. Any of these symptoms may represent a serious problem that is an emergency. Do not wait to see if the symptoms will go away. Get medical help at once. Call your local emergency services (911 in [place] Do not drive yourself to the hospital. Document Released: 09/13/2004 Document Revised: 12/21/2013 Document Reviewed: 02/06/2013 [employer] Patient Information 2015 [employer]. This information is not intended to replace advice given   to you by your health care provider. Make sure you discuss any questions you have with your health care provider.  

## 2014-10-08 ENCOUNTER — Encounter: Payer: Self-pay | Admitting: Cardiovascular Disease

## 2014-10-08 ENCOUNTER — Ambulatory Visit (INDEPENDENT_AMBULATORY_CARE_PROVIDER_SITE_OTHER): Payer: Medicare Other | Admitting: Cardiovascular Disease

## 2014-10-08 VITALS — BP 128/72 | HR 54 | Ht 68.0 in | Wt 176.8 lb

## 2014-10-08 DIAGNOSIS — I6523 Occlusion and stenosis of bilateral carotid arteries: Secondary | ICD-10-CM

## 2014-10-08 DIAGNOSIS — R0989 Other specified symptoms and signs involving the circulatory and respiratory systems: Secondary | ICD-10-CM

## 2014-10-08 DIAGNOSIS — I440 Atrioventricular block, first degree: Secondary | ICD-10-CM

## 2014-10-08 DIAGNOSIS — I2510 Atherosclerotic heart disease of native coronary artery without angina pectoris: Secondary | ICD-10-CM

## 2014-10-08 DIAGNOSIS — I10 Essential (primary) hypertension: Secondary | ICD-10-CM

## 2014-10-08 MED ORDER — LISINOPRIL 40 MG PO TABS: 40.0000 mg | ORAL_TABLET | Freq: Every day | ORAL | Status: DC

## 2014-10-08 MED ORDER — HYDROCHLOROTHIAZIDE 12.5 MG PO CAPS: 12.5000 mg | ORAL_CAPSULE | Freq: Every day | ORAL | Status: DC

## 2014-10-08 MED ORDER — SIMVASTATIN 20 MG PO TABS: 20.0000 mg | ORAL_TABLET | Freq: Every day | ORAL | Status: DC

## 2014-10-08 MED ORDER — AMLODIPINE BESYLATE 10 MG PO TABS
10.0000 mg | ORAL_TABLET | Freq: Every day | ORAL | Status: DC
Start: 2014-10-08 — End: 2015-09-13

## 2014-10-08 NOTE — Assessment & Plan Note (Signed)
Well controlled.  Continue current medications and low sodium Dash type diet.    

## 2014-10-08 NOTE — Progress Notes (Signed)
Patient ID: [name], male   DOB: 04-11-1928, 81 y.o.   MRN: [id number] 81 y.o.  patient  Previously seen by Dr Verl Blalock .  Has CAD  Last cath 2009    Vascular disease with previous LCEA  Last duplex in January with patent CEA and no significant right sided disease.  Duplex 2/16 wide open left CEA and only 40% right sided plaque   05/14/2008:  Cardiac Cath Findings: RESULTS: Hemodynamics, LV 163/26, Ao 169/108. Coronaries, left main  had luminal irregularities. The LAD was moderately to severely  calcified. There was long proximal 50% stenosis. There were focal mid  tandem 40% lesions. There was diffuse 25% stenosis in the mid and  distal vessel. The apex was subtotally stenosed. It was a large vessel  wrapping the apex. There is a small first diagonal which was branching  and diffusely diseased with subtotal stenosis. The circumflex was  heavily calcified. It was occluded after the ramus intermediate. There  were some bridging collaterals with faint filling of the distal  circumflex and OM. It appeared to be a large vessel. The ramus  intermediate was a very large vessel. There were proximal tandem 30%  lesions. There was mid long 40% stenosis. There was a mid 70% stenosis  following this. The right coronary artery was a dominant vessel. It  was moderately calcified. There was long mid 70-80% stenosis. There  was distal 60% stenosis before the PDA. Posterolateral had small  luminal irregularities. The posterolateral was small with luminal  irregularities. The PDA had long proximal 30-40% stenosis. The EF was  65% and normal.  CONCLUSION:  1. Diffuse coronary plaque.  2. Preserved left ventricular function.   Active no chest pain Needs nitro No TIAls  Compliant with meds.  Retired from [employer] and went to [employer].  Was in computer electronics Had AAA screening at [facility] and no aneurysm  ROS: Denies fever, malais, weight loss, blurry vision, decreased visual acuity, cough, sputum, SOB,  hemoptysis, pleuritic pain, palpitaitons, heartburn, abdominal pain, melena, lower extremity edema, claudication, or rash.  All other systems reviewed and negative  General: Affect appropriate Healthy:  appears stated age 8: normal Neck supple with no adenopathy JVP normal Prev LCEA and right  bruits no thyromegaly Lungs clear with no wheezing and good diaphragmatic motion Heart:  S1/S2 no murmur, no rub, gallop or click PMI normal Abdomen: benighn, BS positve, no tenderness, no AAA no bruit.  No HSM or HJR Distal pulses intact with no bruits No edema Neuro non-focal Skin warm and dry No muscular weakness   Current Outpatient Prescriptions  Medication Sig Dispense Refill  . amLODipine (NORVASC) 10 MG tablet TAKE 1 TABLET DAILY (NEED TO CONTACT OFFICE TO SCHEDULE APPOINTMENT FOR FUTURE REFILLS, [phone]) 30 tablet 0  . aspirin 325 MG tablet Take 325 mg by mouth daily.      Marland Kitchen b complex vitamins tablet Take 1 tablet by mouth daily.    . Calcium Carbonate (CALCIUM 500 PO) Take 1 tablet by mouth daily.    . Glucosamine 500 MG CAPS Take by mouth daily.    . hydrochlorothiazide (MICROZIDE) 12.5 MG capsule TAKE 1 CAPSULE DAILY 30 capsule 0  . ibuprofen (ADVIL,MOTRIN) 200 MG tablet Take 200 mg by mouth every 6 (six) hours as needed.    Marland Kitchen lisinopril (PRINIVIL,ZESTRIL) 40 MG tablet TAKE 1 TABLET DAILY (PATIENT NEEDS TO CONTACT OFFICE TO SCHEDULE APPOINTMENT FOR FUTURE REFILLS. PHONE: [phone]) 30 tablet 0  . metoprolol succinate (TOPROL-XL) 25 MG  24 hr tablet TAKE 1 TABLET DAILY (NEED APPOINTMENT FOR FUTURE REFILLS) 90 tablet 1  . Multiple Vitamins-Minerals (MULTIVITAMIN WITH MINERALS) tablet Take 1 tablet by mouth daily.      . nitroGLYCERIN (NITROSTAT) 0.4 MG SL tablet Place 1 tablet (0.4 mg total) under the tongue every 5 (five) minutes as needed for chest pain. 25 tablet 4  . Omega-3 Fatty Acids (FISH OIL) 300 MG CAPS Take 1 capsule by mouth daily.     . simvastatin (ZOCOR) 20  MG tablet TAKE 1 TABLET AT BEDTIME (NEED TO CONTACT OFFICE TO SCHEDULE APPOINTMENT FOR FUTURE REFILLS, PHONE NUMBER [phone]) 30 tablet 0  . vitamin C (ASCORBIC ACID) 500 MG tablet Take 500 mg by mouth daily.      . vitamin D, CHOLECALCIFEROL, 400 UNITS tablet Take 400 Units by mouth daily.    . vitamin E 100 UNIT capsule Take 100 Units by mouth daily.     No current facility-administered medications for this visit.    Allergies  Review of patient's allergies indicates no known allergies.  Electrocardiogram:   07/13/13  SR rate 62 PR 296  Otherwise normal   10/08/14  SR rate 54  PR 380  Otherwise normal   Assessment and Plan

## 2014-10-08 NOTE — Assessment & Plan Note (Signed)
Stable with no angina and good activity level.  Continue medical Rx  

## 2014-10-08 NOTE — Assessment & Plan Note (Signed)
Post Left CEA Early.  F/U duplex with [facility] 2/17  40% reidual RICA by duplex 2/16 ASA

## 2014-10-08 NOTE — Patient Instructions (Addendum)
Your physician recommends that you schedule a follow-up appointment in: NEXT AVAILABLE WITH  DR [facility] Your physician has recommended you make the following change in your medication:  STOP   METOPROLOL

## 2014-10-08 NOTE — Assessment & Plan Note (Signed)
Relative bradycardia and increased PR Have already decreased metoprolol and halving pills  Stop entirely  F/U next available IF HR goes into 70's or higher can consider starting bystolic given CAD.  Has tried atenolol in past and caused even more bradycardia

## 2014-11-02 NOTE — Progress Notes (Signed)
Patient ID: [name], male   DOB: 10-26-1927, 81 y.o.   MRN: [id number] 81 y.o.  patient  Previously seen by Dr Verl Blalock .  Has CAD  Last cath 2009    Vascular disease with previous LCEA  Last duplex in January with patent CEA and no significant right sided disease.  Duplex 2/16 wide open left CEA and only 40% right sided plaque   05/14/2008:  Cardiac Cath Findings: RESULTS: Hemodynamics, LV 163/26, Ao 169/108. Coronaries, left main  had luminal irregularities. The LAD was moderately to severely  calcified. There was long proximal 50% stenosis. There were focal mid  tandem 40% lesions. There was diffuse 25% stenosis in the mid and  distal vessel. The apex was subtotally stenosed. It was a large vessel  wrapping the apex. There is a small first diagonal which was branching  and diffusely diseased with subtotal stenosis. The circumflex was  heavily calcified. It was occluded after the ramus intermediate. There  were some bridging collaterals with faint filling of the distal  circumflex and OM. It appeared to be a large vessel. The ramus  intermediate was a very large vessel. There were proximal tandem 30%  lesions. There was mid long 40% stenosis. There was a mid 70% stenosis  following this. The right coronary artery was a dominant vessel. It  was moderately calcified. There was long mid 70-80% stenosis. There  was distal 60% stenosis before the PDA. Posterolateral had small  luminal irregularities. The posterolateral was small with luminal  irregularities. The PDA had long proximal 30-40% stenosis. The EF was  65% and normal.  CONCLUSION:  1. Diffuse coronary plaque.  2. Preserved left ventricular function.   Active no chest pain Needs nitro No TIAls  Compliant with meds.  Retired from [employer] and went to [employer].  Was in computer electronics Had AAA screening at [facility] and no aneurysm  ROS: Denies fever, malais, weight loss, blurry vision, decreased visual acuity, cough, sputum, SOB,  hemoptysis, pleuritic pain, palpitaitons, heartburn, abdominal pain, melena, lower extremity edema, claudication, or rash.  All other systems reviewed and negative  General: Affect appropriate Healthy:  appears stated age 53: normal Neck supple with no adenopathy JVP normal Prev LCEA and right  bruits no thyromegaly Lungs clear with no wheezing and good diaphragmatic motion Heart:  S1/S2 aortic sclerosis  murmur, no rub, gallop or click PMI normal Abdomen: benighn, BS positve, no tenderness, no AAA no bruit.  No HSM or HJR Distal pulses intact with no bruits No edema Neuro non-focal Skin warm and dry No muscular weakness   Current Outpatient Prescriptions  Medication Sig Dispense Refill  . amLODipine (NORVASC) 10 MG tablet Take 1 tablet (10 mg total) by mouth daily. 90 tablet 3  . aspirin 325 MG tablet Take 325 mg by mouth daily.      Marland Kitchen b complex vitamins tablet Take 1 tablet by mouth daily.    . Calcium Carbonate (CALCIUM 500 PO) Take 1 tablet by mouth daily.    . Glucosamine 500 MG CAPS Take by mouth daily.    . hydrochlorothiazide (MICROZIDE) 12.5 MG capsule Take 1 capsule (12.5 mg total) by mouth daily. 90 capsule 3  . ibuprofen (ADVIL,MOTRIN) 200 MG tablet Take 200 mg by mouth every 6 (six) hours as needed.    Marland Kitchen lisinopril (PRINIVIL,ZESTRIL) 40 MG tablet Take 1 tablet (40 mg total) by mouth daily. 90 tablet 3  . Multiple Vitamins-Minerals (MULTIVITAMIN WITH MINERALS) tablet Take 1 tablet by mouth daily.      Marland Kitchen  nitroGLYCERIN (NITROSTAT) 0.4 MG SL tablet Place 1 tablet (0.4 mg total) under the tongue every 5 (five) minutes as needed for chest pain. 25 tablet 4  . Omega-3 Fatty Acids (FISH OIL) 300 MG CAPS Take 1 capsule by mouth daily.     . simvastatin (ZOCOR) 20 MG tablet Take 1 tablet (20 mg total) by mouth daily. 90 tablet 3  . vitamin C (ASCORBIC ACID) 500 MG tablet Take 500 mg by mouth daily.      . vitamin D, CHOLECALCIFEROL, 400 UNITS tablet Take 400 Units by mouth  daily.    . vitamin E 100 UNIT capsule Take 100 Units by mouth daily.     No current facility-administered medications for this visit.    Allergies  Review of patient's allergies indicates no known allergies.  Electrocardiogram:   07/13/13  SR rate 62 PR 296  Otherwise normal   10/08/14  SR rate 54  PR 380  Otherwise normal   Assessment and Plan

## 2014-11-03 ENCOUNTER — Encounter: Payer: Self-pay | Admitting: Cardiovascular Disease

## 2014-11-03 ENCOUNTER — Ambulatory Visit (INDEPENDENT_AMBULATORY_CARE_PROVIDER_SITE_OTHER): Payer: Medicare Other | Admitting: Cardiovascular Disease

## 2014-11-03 VITALS — BP 138/58 | HR 57 | Ht 68.0 in | Wt 177.4 lb

## 2014-11-03 DIAGNOSIS — R0989 Other specified symptoms and signs involving the circulatory and respiratory systems: Secondary | ICD-10-CM

## 2014-11-03 DIAGNOSIS — I6523 Occlusion and stenosis of bilateral carotid arteries: Secondary | ICD-10-CM

## 2014-11-03 DIAGNOSIS — I2510 Atherosclerotic heart disease of native coronary artery without angina pectoris: Secondary | ICD-10-CM | POA: Diagnosis not present

## 2014-11-03 DIAGNOSIS — I10 Essential (primary) hypertension: Secondary | ICD-10-CM

## 2014-11-03 DIAGNOSIS — I440 Atrioventricular block, first degree: Secondary | ICD-10-CM | POA: Diagnosis not present

## 2014-11-03 DIAGNOSIS — E785 Hyperlipidemia, unspecified: Secondary | ICD-10-CM

## 2014-11-03 NOTE — Patient Instructions (Signed)
Your physician wants you to follow-up in: YEAR WITH DR NISHAN  You will receive a reminder letter in the mail two months in advance. If you don't receive a letter, please call our office to schedule the follow-up appointment.  Your physician recommends that you continue on your current medications as directed. Please refer to the Current Medication list given to you today. 

## 2014-11-03 NOTE — Assessment & Plan Note (Signed)
No high grade AV block  Beta blockers stopped  No symptoms Yearly ECG

## 2014-11-03 NOTE — Assessment & Plan Note (Signed)
Cholesterol is at goal.  Continue current dose of statin and diet Rx.  No myalgias or side effects.  F/U  LFT's in 6 months. Lab Results  Component Value Date   LDLCALC 70 05/24/2010

## 2014-11-03 NOTE — Assessment & Plan Note (Signed)
Stable with no angina and good activity level.  Continue medical Rx  

## 2014-11-03 NOTE — Assessment & Plan Note (Signed)
2/16 LCEA widely patient and 40% right ICA stenosis  ASA and statin  Duplex 2/17

## 2014-11-03 NOTE — Assessment & Plan Note (Signed)
Well controlled.  Continue current medications and low sodium Dash type diet.    

## 2015-04-01 ENCOUNTER — Other Ambulatory Visit: Payer: Self-pay | Admitting: Adult Health

## 2015-07-16 ENCOUNTER — Ambulatory Visit (INDEPENDENT_AMBULATORY_CARE_PROVIDER_SITE_OTHER): Payer: Medicare Other | Admitting: Family Medicine

## 2015-07-16 VITALS — BP 120/62 | HR 75 | Temp 97.5°F | Resp 18 | Ht 68.0 in | Wt 144.8 lb

## 2015-07-16 DIAGNOSIS — S61402A Unspecified open wound of left hand, initial encounter: Secondary | ICD-10-CM | POA: Diagnosis not present

## 2015-07-16 DIAGNOSIS — S61412A Laceration without foreign body of left hand, initial encounter: Secondary | ICD-10-CM

## 2015-07-16 DIAGNOSIS — Z23 Encounter for immunization: Secondary | ICD-10-CM

## 2015-07-16 DIAGNOSIS — I6523 Occlusion and stenosis of bilateral carotid arteries: Secondary | ICD-10-CM

## 2015-07-16 DIAGNOSIS — S0181XA Laceration without foreign body of other part of head, initial encounter: Secondary | ICD-10-CM

## 2015-07-16 NOTE — Progress Notes (Signed)
Procedure: Verbal consent obtained. Skin was anesthetized with 4 cc 1% lido with epi and cleaned with soap and water. laceration located to scalp and forehead was sutured with #6 simple 4.0 prolene sutures. Wound was dressed and wound care discussed.  Procedure: verbal consent obtained. Skin tears on left hand cleaned with soap and water. #2 steri strips placed. Wound was dressed and wound care discussed.

## 2015-07-16 NOTE — Patient Instructions (Signed)
Please see [place] in 7- 10 days for suture removal  If you start to have a severe headache, nausea, vomiting, or other symptoms that concern you please see [place] or otherwise seek care right away  WOUND CARE Please return in 7 days to have your stitches/staples removed or sooner if you have concerns. Marland Kitchen Keep area clean and dry for 24 hours. Do not remove bandage, if applied. . After 24 hours, remove bandage and wash wound gently with mild soap and warm water. Reapply a new bandage after cleaning wound, if directed. . Continue daily cleansing with soap and water until stitches/staples are removed. . Do not apply any ointments or creams to the wound while stitches/staples are in place, as this may cause delayed healing. . Notify the office if you experience any of the following signs of infection: Swelling, redness, pus drainage, streaking, fever >101.0 F . Notify the office if you experience excessive bleeding that does not stop after 15-20 minutes of constant, firm pressure.

## 2015-07-16 NOTE — Progress Notes (Signed)
[facility] [address] [place] [address] [phone]  Date:  07/16/2015   Name:  [name]   DOB:  [birth year]   MRN:  [id number]  PCP:  Helane Rima, MD    Chief Complaint: Head Injury and Hand Injury   History of Present Illness:  [name] is a 81 y.o. very pleasant male patient who presents with the following:  He was coming in the house this am and missed a step- he fell and cut his left hand on the corner of the door, and also bumped his head on the doorframe.  Thinks he may need stitches for his head This occurred this am- about one hour ago.   No LOC He takes just asa- no other blood thinners.   No headache, no vision change His last tetanus shot was ? more than 5 years ago- he is not quite sure.  He would like to update this today Also would like a flu shot He feels well today otherwise   Patient Active Problem List   Diagnosis Date Noted  . Sinus bradycardia by electrocardiogram 05/08/2012  . Elevated lipids 05/17/2010  . OLD MYOCARDIAL INFARCTION 04/29/2009  . CAD, NATIVE VESSEL 04/29/2009  . Bilateral carotid bruits 04/29/2009  . Essential hypertension 01/06/2009  . AV BLOCK, 1ST DEGREE 01/06/2009  . PREMATURE VENTRICULAR CONTRACTIONS 01/06/2009    Past Medical History  Diagnosis Date  . PREMATURE VENTRICULAR CONTRACTIONS   . HYPERTENSION, UNSPECIFIED   . HYPERLIPIDEMIA-MIXED   . CAROTID ARTERY STENOSIS, WITHOUT INFARCTION   . CAD, NATIVE VESSEL   . AV BLOCK, 1ST DEGREE   . Old myocardial infarction     02/1992  . GERD (gastroesophageal reflux disease)   . Stroke ([facility])   . TIA (transient ischemic attack)     Past Surgical History  Procedure Laterality Date  . Hernia repair    . Tonsillectomy    . Cardiac catheterization      x2  . Carotid endarterectomy  11/28/2006    left    Social History  Substance Use Topics  . Smoking status: Never Smoker   . Smokeless tobacco: Never Used  . Alcohol Use: 9.0 oz/week     15 Glasses of wine per week    Family History  Problem Relation Age of Onset  . Diabetes Mother   . Diabetes Sister   . Cancer Sister 103    colon  . Heart disease Sister     No Known Allergies  Medication list has been reviewed and updated.  Current Outpatient Prescriptions on File Prior to Visit  Medication Sig Dispense Refill  . aspirin 325 MG tablet Take 325 mg by mouth daily.      . Calcium Carbonate (CALCIUM 500 PO) Take 1 tablet by mouth daily.    . Glucosamine 500 MG CAPS Take by mouth daily.    . hydrochlorothiazide (MICROZIDE) 12.5 MG capsule Take 1 capsule (12.5 mg total) by mouth daily. 90 capsule 3  . ibuprofen (ADVIL,MOTRIN) 200 MG tablet Take 200 mg by mouth every 6 (six) hours as needed.    Marland Kitchen lisinopril (PRINIVIL,ZESTRIL) 40 MG tablet Take 1 tablet (40 mg total) by mouth daily. 90 tablet 3  . Multiple Vitamins-Minerals (MULTIVITAMIN WITH MINERALS) tablet Take 1 tablet by mouth daily.      . Omega-3 Fatty Acids (FISH OIL) 300 MG CAPS Take 1 capsule by mouth daily.     . simvastatin (ZOCOR) 20 MG tablet Take 1  tablet (20 mg total) by mouth daily. 90 tablet 3  . vitamin C (ASCORBIC ACID) 500 MG tablet Take 500 mg by mouth daily.      . vitamin D, CHOLECALCIFEROL, 400 UNITS tablet Take 400 Units by mouth daily.    . vitamin E 100 UNIT capsule Take 100 Units by mouth daily.    Marland Kitchen amLODipine (NORVASC) 10 MG tablet Take 1 tablet (10 mg total) by mouth daily. (Patient not taking: Reported on 07/16/2015) 90 tablet 3  . b complex vitamins tablet Take 1 tablet by mouth daily.    . nitroGLYCERIN (NITROSTAT) 0.4 MG SL tablet Place 1 tablet (0.4 mg total) under the tongue every 5 (five) minutes as needed for chest pain. (Patient not taking: Reported on 07/16/2015) 25 tablet 4   No current facility-administered medications on file prior to visit.    Review of Systems:  As per HPI- otherwise negative.   Physical Examination: Filed Vitals:   07/16/15 0955  BP: 120/62   Pulse: 75  Temp: 97.5 F (36.4 C)  Resp: 18   Filed Vitals:   07/16/15 0955  Height: 5\' 8"  (1.727 m)  Weight: 144 lb 12.8 oz (65.681 kg)   Body mass index is 22.02 kg/(m^2). Ideal Body Weight: Weight in (lb) to have BMI = 25: 164.1  GEN: WDWN, NAD, Non-toxic, A & O x 3, older man in no distress. Laceration on his forehead, oriented vertically, about 4 cm in length.  Edges are well approximated, no evidence of fracture Skin tear on dorsum of left hand. approx 1cm in length, does not need sutures  HEENT: Atraumatic, Normocephalic. Neck supple. No masses, No LAD.  Bilateral TM wnl, oropharynx normal.  PEERL,EOMI.   Ears and Nose: No external deformity. CV: RRR, No M/G/R. No JVD. No thrill. No extra heart sounds. PULM: CTA B, no wheezes, crackles, rhonchi. No retractions. No resp. distress. No accessory muscle use. EXTR: No c/c/e NEURO Normal gait. Moves all extremities normally, no confusion, no facial droop PSYCH: Normally interactive. Conversant. Not depressed or anxious appearing.  Calm demeanor.    Assessment and Plan: Laceration of forehead without complication, initial encounter - Plan: Td vaccine greater than or equal to 7yo preservative free IM  Skin tear of left hand without complication, initial encounter - Plan: Td vaccine greater than or equal to 7yo preservative free IM  Immunization due - Plan: Flu Vaccine QUAD 36+ mos IM  Wound repaired as per note by Bennett Scrape, PA-C.  Given flu shot and td vaccine today He is feeling well, no sign of more serious head injury, however cautioned him to seek care if any sx develop- Sooner if worse.   Signed Lamar Blinks, MD

## 2015-07-23 ENCOUNTER — Ambulatory Visit (INDEPENDENT_AMBULATORY_CARE_PROVIDER_SITE_OTHER): Payer: Medicare Other | Admitting: Physician Assistant

## 2015-07-23 VITALS — BP 124/60 | HR 59 | Temp 97.3°F | Resp 17 | Ht 68.0 in | Wt 178.6 lb

## 2015-07-23 DIAGNOSIS — S0181XD Laceration without foreign body of other part of head, subsequent encounter: Secondary | ICD-10-CM

## 2015-07-23 NOTE — Progress Notes (Signed)
   07/23/2015 11:21 AM   DOB: 1927-09-15 / MRN: [id number]  SUBJECTIVE:  Mr. Foppiano is a well appearing 81 y.o. here today for wound care. He denies exquisite tenderness at the site of the wound, nausea, emesis, fever and chills.  He has been compliant with medical therapy and recommendations thus far.   He has No Known Allergies.   He  has a past medical history of PREMATURE VENTRICULAR CONTRACTIONS; HYPERTENSION, UNSPECIFIED; HYPERLIPIDEMIA-MIXED; CAROTID ARTERY STENOSIS, WITHOUT INFARCTION; CAD, NATIVE VESSEL; AV BLOCK, 1ST DEGREE; Old myocardial infarction; GERD (gastroesophageal reflux disease); Stroke [facility]); and TIA (transient ischemic attack).    He  reports that he has never smoked. He has never used smokeless tobacco. He reports that he drinks about 9.0 oz of alcohol per week. He reports that he does not use illicit drugs. He  has no sexual activity history on file. The patient  has past surgical history that includes Hernia repair; Tonsillectomy; Cardiac catheterization; and Carotid endarterectomy (11/28/2006).  His family history includes Cancer (age of onset: 53) in his sister; Diabetes in his mother and sister; Heart disease in his sister.  ROS  Problem list and medications reviewed and updated by myself where necessary, and exist elsewhere in the encounter.   OBJECTIVE:  BP 124/60 mmHg  Pulse 59  Temp(Src) 97.3 F (36.3 C) (Oral)  Resp 17  Ht 5\' 8"  (1.727 m)  Wt 178 lb 9.6 oz (81.012 kg)  BMI 27.16 kg/m2  SpO2 97% CrCl cannot be calculated (Patient has no serum creatinine result on file.).  Physical Exam  Constitutional: Vital signs are normal.  HENT:  Head:      No results found for this or any previous visit (from the past 48 hour(s)).  ASSESSMENT AND PLAN  Mckinsey was seen today for suture / staple removal.  Diagnoses and all orders for this visit:  Laceration of forehead without complication, subsequent encounter: Wound has healed.  RTC as needed.    The  patient was advised to call or return to clinic if he does not see an improvement in symptoms or to seek the care of the closest emergency department if he worsens with the above plan.   Philis Fendt, MHS, PA-C [facility] 07/23/2015 11:21 AM

## 2015-07-25 NOTE — Progress Notes (Signed)
  Medical screening examination/treatment/procedure(s) were performed by non-physician practitioner and as supervising physician I was immediately available for consultation/collaboration.     

## 2015-09-13 ENCOUNTER — Other Ambulatory Visit: Payer: Self-pay | Admitting: Cardiovascular Disease

## 2015-10-07 ENCOUNTER — Encounter: Payer: Self-pay | Admitting: Family

## 2015-10-11 ENCOUNTER — Ambulatory Visit: Payer: Medicare Other | Admitting: Family

## 2015-10-11 ENCOUNTER — Encounter (HOSPITAL_COMMUNITY): Payer: Medicare Other

## 2015-10-18 ENCOUNTER — Ambulatory Visit (HOSPITAL_COMMUNITY)
Admission: RE | Admit: 2015-10-18 | Discharge: 2015-10-18 | Disposition: A | Discharge status: Home / Self Care | Payer: Medicare Other | Source: Ambulatory Visit | Attending: Family | Admitting: Family

## 2015-10-18 ENCOUNTER — Ambulatory Visit (INDEPENDENT_AMBULATORY_CARE_PROVIDER_SITE_OTHER): Payer: Medicare Other | Admitting: Family

## 2015-10-18 ENCOUNTER — Encounter: Payer: Self-pay | Admitting: Family

## 2015-10-18 VITALS — BP 142/67 | HR 58 | Temp 97.0°F | Resp 16 | Ht 68.0 in | Wt 177.0 lb

## 2015-10-18 DIAGNOSIS — Z48812 Encounter for surgical aftercare following surgery on the circulatory system: Secondary | ICD-10-CM | POA: Diagnosis present

## 2015-10-18 DIAGNOSIS — I6521 Occlusion and stenosis of right carotid artery: Secondary | ICD-10-CM

## 2015-10-18 DIAGNOSIS — E782 Mixed hyperlipidemia: Secondary | ICD-10-CM | POA: Insufficient documentation

## 2015-10-18 DIAGNOSIS — I10 Essential (primary) hypertension: Secondary | ICD-10-CM | POA: Insufficient documentation

## 2015-10-18 DIAGNOSIS — Z9889 Other specified postprocedural states: Secondary | ICD-10-CM

## 2015-10-18 DIAGNOSIS — I6523 Occlusion and stenosis of bilateral carotid arteries: Secondary | ICD-10-CM | POA: Diagnosis not present

## 2015-10-18 NOTE — Progress Notes (Signed)
Chief Complaint: Extracranial Carotid Artery Stenosis   History of Present Illness  [name] is a 82 y.o. male patient of Dr. Donnetta Hutching who is status post left CEA in April 2008. He returns today for follow up. He was hospitalized at [facility] in 2008, prior to the CEA, for right hand shaking, he was told that he had a mild stroke. The patient denies any history of other TIA or stroke symptoms other than the right hand shaking, specifically the patient denies a history of amaurosis fugax or monocular blindness, denies a history unilateral of facial drooping, denies a history of hemiplegia, and denies a history of receptive or expressive aphasia.   Pt states he is scheduled to see his cardiologist, Dr. Johnsie Cancel, the end of March, 2017  Patient denies New Medical or Surgical History. He walks about 30 minutes daily. He denies claudication symptoms, denies non-healing wounds.  Pt Diabetic: No Pt smoker: non-smoker  Pt meds include: Statin : Yes ASA: Yes Other anticoagulants/antiplatelets: no    Past Medical History  Diagnosis Date  . PREMATURE VENTRICULAR CONTRACTIONS   . HYPERTENSION, UNSPECIFIED   . HYPERLIPIDEMIA-MIXED   . CAROTID ARTERY STENOSIS, WITHOUT INFARCTION   . CAD, NATIVE VESSEL   . AV BLOCK, 1ST DEGREE   . Old myocardial infarction     02/1992  . GERD (gastroesophageal reflux disease)   . Stroke ([place])   . TIA (transient ischemic attack)     Social History Social History  Substance Use Topics  . Smoking status: Never Smoker   . Smokeless tobacco: Never Used  . Alcohol Use: 9.0 oz/week    15 Glasses of wine per week    Family History Family History  Problem Relation Age of Onset  . Diabetes Mother   . Diabetes Sister   . Cancer Sister 25    colon  . Heart disease Sister     Surgical History Past Surgical History  Procedure Laterality Date  . Hernia repair    . Tonsillectomy    . Cardiac catheterization      x2  . Carotid endarterectomy   11/28/2006    left    No Known Allergies  Current Outpatient Prescriptions  Medication Sig Dispense Refill  . amLODipine (NORVASC) 10 MG tablet TAKE 1 TABLET DAILY 90 tablet 0  . aspirin 325 MG tablet Take 325 mg by mouth daily.      Marland Kitchen b complex vitamins tablet Take 1 tablet by mouth daily.    . Calcium Carbonate (CALCIUM 500 PO) Take 1 tablet by mouth daily.    . Glucosamine 500 MG CAPS Take by mouth daily.    . hydrochlorothiazide (MICROZIDE) 12.5 MG capsule TAKE 1 CAPSULE DAILY 90 capsule 0  . ibuprofen (ADVIL,MOTRIN) 200 MG tablet Take 200 mg by mouth every 6 (six) hours as needed.    Marland Kitchen lisinopril (PRINIVIL,ZESTRIL) 40 MG tablet TAKE 1 TABLET DAILY 90 tablet 0  . Multiple Vitamins-Minerals (MULTIVITAMIN WITH MINERALS) tablet Take 1 tablet by mouth daily.      . nitroGLYCERIN (NITROSTAT) 0.4 MG SL tablet Place 1 tablet (0.4 mg total) under the tongue every 5 (five) minutes as needed for chest pain. 25 tablet 4  . Omega-3 Fatty Acids (FISH OIL) 300 MG CAPS Take 1 capsule by mouth daily.     . simvastatin (ZOCOR) 20 MG tablet TAKE 1 TABLET DAILY 90 tablet 0  . vitamin C (ASCORBIC ACID) 500 MG tablet Take 500 mg by mouth daily.      Marland Kitchen  vitamin D, CHOLECALCIFEROL, 400 UNITS tablet Take 400 Units by mouth daily.    . vitamin E 100 UNIT capsule Take 100 Units by mouth daily.     No current facility-administered medications for this visit.    Review of Systems : See HPI for pertinent positives and negatives.  Physical Examination  Filed Vitals:   10/18/15 1532 10/18/15 1537 10/18/15 1538  BP: 158/68 139/63 142/67  Pulse: 58 58 58  Temp: 97 F (36.1 C)    TempSrc: Oral    Resp: 16    Height: 5\' 8"  (1.727 m)    Weight: 177 lb (80.287 kg)    SpO2: 96%     Body mass index is 26.92 kg/(m^2).  General: WDWN male in NAD GAIT: normal Eyes: PERRLA Pulmonary: Non-labored, CTAB Cardiac: regular rhythm, no detected murmur.  VASCULAR EXAM Carotid Bruits Left Right    positive Negative   Aorta is not palpable. Radial pulses are 2+ palpable and equal.      LE Pulses LEFT RIGHT   POPLITEAL not palpable  not palpable    Gastrointestinal: soft, nontender, BS WNL, no r/g, no palpable masses.  Musculoskeletal: No muscle atrophy/wasting. M/S 5/5 throughout, Extremities without ischemic changes.  Neurologic: A&O X 3; Appropriate Affect, Speech is normal CN 2-12 intact, pain and light touch intact in extremities, Motor exam as listed above.                Non-Invasive Vascular Imaging CAROTID DUPLEX 10/18/2015   Patent left CEA site with 1-39% bilateral ICA stenosis. No significant change compared to exam of 10/05/14.     Assessment: [name] is a 82 y.o. male who is status post left CEA in April 2008. He had a preoperative stoke; residual neurological deficit is tremor of the right hand, no subsequent stroke or TIA. Today's carotid duplex suggests a patent left CEA site with 1-39% bilateral ICA stenosis. No significant change compared to exam of 10/05/14.   Fortunately he does not have DM and has never used tobacco.    Plan: Follow-up in 1 year with Carotid Duplex scan.   I discussed in depth with the patient the nature of atherosclerosis, and emphasized the importance of maximal medical management including strict control of blood pressure, blood glucose, and lipid levels, obtaining regular exercise, and continued cessation of smoking.  The patient is aware that without maximal medical management the underlying atherosclerotic disease process will progress, limiting the benefit of any interventions. The patient was given information about stroke prevention and what symptoms should prompt the patient to seek immediate medical care. Thank you for allowing [place] to participate in this patient's  care.  Clemon Chambers, RN, MSN, FNP-C [facility] Office: [phone]  Clinic Physician: Early  10/18/2015 3:55 PM

## 2015-10-18 NOTE — Patient Instructions (Signed)
Stroke Prevention Some medical conditions and behaviors are associated with an increased chance of having a stroke. You may prevent a stroke by making healthy choices and managing medical conditions. HOW CAN I REDUCE MY RISK OF HAVING A STROKE?   Stay physically active. Get at least 30 minutes of activity on most or all days.  Do not smoke. It may also be helpful to avoid exposure to secondhand smoke.  Limit alcohol use. Moderate alcohol use is considered to be:  No more than 2 drinks per day for men.  No more than 1 drink per day for nonpregnant women.  Eat healthy foods. This involves:  Eating 5 or more servings of fruits and vegetables a day.  Making dietary changes that address high blood pressure (hypertension), high cholesterol, diabetes, or obesity.  Manage your cholesterol levels.  Making food choices that are high in fiber and low in saturated fat, trans fat, and cholesterol may control cholesterol levels.  Take any prescribed medicines to control cholesterol as directed by your health care provider.  Manage your diabetes.  Controlling your carbohydrate and sugar intake is recommended to manage diabetes.  Take any prescribed medicines to control diabetes as directed by your health care provider.  Control your hypertension.  Making food choices that are low in salt (sodium), saturated fat, trans fat, and cholesterol is recommended to manage hypertension.  Ask your health care provider if you need treatment to lower your blood pressure. Take any prescribed medicines to control hypertension as directed by your health care provider.  If you are 18-39 years of age, have your blood pressure checked every 3-5 years. If you are 40 years of age or older, have your blood pressure checked every year.  Maintain a healthy weight.  Reducing calorie intake and making food choices that are low in sodium, saturated fat, trans fat, and cholesterol are recommended to manage  weight.  Stop drug abuse.  Avoid taking birth control pills.  Talk to your health care provider about the risks of taking birth control pills if you are over 35 years old, smoke, get migraines, or have ever had a blood clot.  Get evaluated for sleep disorders (sleep apnea).  Talk to your health care provider about getting a sleep evaluation if you snore a lot or have excessive sleepiness.  Take medicines only as directed by your health care provider.  For some people, aspirin or blood thinners (anticoagulants) are helpful in reducing the risk of forming abnormal blood clots that can lead to stroke. If you have the irregular heart rhythm of atrial fibrillation, you should be on a blood thinner unless there is a good reason you cannot take them.  Understand all your medicine instructions.  Make sure that other conditions (such as anemia or atherosclerosis) are addressed. SEEK IMMEDIATE MEDICAL CARE IF:   You have sudden weakness or numbness of the face, arm, or leg, especially on one side of the body.  Your face or eyelid droops to one side.  You have sudden confusion.  You have trouble speaking (aphasia) or understanding.  You have sudden trouble seeing in one or both eyes.  You have sudden trouble walking.  You have dizziness.  You have a loss of balance or coordination.  You have a sudden, severe headache with no known cause.  You have new chest pain or an irregular heartbeat. Any of these symptoms may represent a serious problem that is an emergency. Do not wait to see if the symptoms will   go away. Get medical help at once. Call your local emergency services (911 in [place] Do not drive yourself to the hospital.   This information is not intended to replace advice given to you by your health care provider. Make sure you discuss any questions you have with your health care provider.   Document Released: 09/13/2004 Document Revised: 08/27/2014 Document Reviewed:  02/06/2013 Elsevier Interactive Patient Education 2016 Elsevier Inc.  

## 2015-11-14 ENCOUNTER — Ambulatory Visit: Payer: Medicare Other | Admitting: Cardiovascular Disease

## 2015-12-10 ENCOUNTER — Other Ambulatory Visit: Payer: Self-pay | Admitting: Cardiovascular Disease

## 2015-12-21 ENCOUNTER — Ambulatory Visit (INDEPENDENT_AMBULATORY_CARE_PROVIDER_SITE_OTHER): Payer: Medicare Other | Admitting: Cardiovascular Disease

## 2015-12-21 VITALS — BP 130/64 | HR 54 | Ht 68.0 in | Wt 174.8 lb

## 2015-12-21 DIAGNOSIS — I440 Atrioventricular block, first degree: Secondary | ICD-10-CM | POA: Diagnosis not present

## 2015-12-21 DIAGNOSIS — I2510 Atherosclerotic heart disease of native coronary artery without angina pectoris: Secondary | ICD-10-CM

## 2015-12-21 DIAGNOSIS — I6521 Occlusion and stenosis of right carotid artery: Secondary | ICD-10-CM

## 2015-12-21 DIAGNOSIS — I4430 Unspecified atrioventricular block: Secondary | ICD-10-CM

## 2015-12-21 NOTE — Progress Notes (Signed)
Patient ID: [name], male   DOB: Jul 14, 1928, 82 y.o.   MRN: [id number]   82 y.o.  patient  Previously seen by Dr Verl Blalock .  Has CAD  Last cath 2009    Vascular disease with previous LCEA  Last duplex in January with patent CEA and no significant right sided disease.  Duplex 2/16 wide open left CEA and only 40% right sided plaque   09/2015 Carotid duplex 1-39% bilateral disease   05/14/2008:  Cardiac Cath Findings: RESULTS: Hemodynamics, LV 163/26, Ao 169/108. Coronaries, left main  had luminal irregularities. The LAD was moderately to severely  calcified. There was long proximal 50% stenosis. There were focal mid  tandem 40% lesions. There was diffuse 25% stenosis in the mid and  distal vessel. The apex was subtotally stenosed. It was a large vessel  wrapping the apex. There is a small first diagonal which was branching  and diffusely diseased with subtotal stenosis. The circumflex was  heavily calcified. It was occluded after the ramus intermediate. There  were some bridging collaterals with faint filling of the distal  circumflex and OM. It appeared to be a large vessel. The ramus  intermediate was a very large vessel. There were proximal tandem 30%  lesions. There was mid long 40% stenosis. There was a mid 70% stenosis  following this. The right coronary artery was a dominant vessel. It  was moderately calcified. There was long mid 70-80% stenosis. There  was distal 60% stenosis before the PDA. Posterolateral had small  luminal irregularities. The posterolateral was small with luminal  irregularities. The PDA had long proximal 30-40% stenosis. The EF was  65% and normal.  CONCLUSION:  1. Diffuse coronary plaque.  2. Preserved left ventricular function.   Active no chest pain Needs nitro No TIAls  Compliant with meds.  Retired from [employer] and went to [employer].  Was in computer electronics Had AAA screening at [facility] and no aneurysm  ROS: Denies fever, malais, weight loss, blurry  vision, decreased visual acuity, cough, sputum, SOB, hemoptysis, pleuritic pain, palpitaitons, heartburn, abdominal pain, melena, lower extremity edema, claudication, or rash.  All other systems reviewed and negative  General: Affect appropriate Healthy:  appears stated age 82: normal Neck supple with no adenopathy JVP normal Prev LCEA and right  bruits no thyromegaly Lungs clear with no wheezing and good diaphragmatic motion Heart:  S1/S2 aortic sclerosis  murmur, no rub, gallop or click PMI normal Abdomen: benighn, BS positve, no tenderness, no AAA no bruit.  No HSM or HJR Distal pulses intact with no bruits No edema Neuro non-focal Skin warm and dry No muscular weakness   Current Outpatient Prescriptions  Medication Sig Dispense Refill  . amLODipine (NORVASC) 10 MG tablet TAKE 1 TABLET DAILY 90 tablet 0  . aspirin 325 MG tablet Take 325 mg by mouth daily.      Marland Kitchen b complex vitamins tablet Take 1 tablet by mouth daily.    . Calcium Carbonate (CALCIUM 500 PO) Take 1 tablet by mouth daily.    . Glucosamine 500 MG CAPS Take by mouth daily.    . hydrochlorothiazide (MICROZIDE) 12.5 MG capsule TAKE 1 CAPSULE DAILY 90 capsule 0  . ibuprofen (ADVIL,MOTRIN) 200 MG tablet Take 200 mg by mouth every 6 (six) hours as needed.    Marland Kitchen lisinopril (PRINIVIL,ZESTRIL) 40 MG tablet TAKE 1 TABLET DAILY 90 tablet 0  . nitroGLYCERIN (NITROSTAT) 0.4 MG SL tablet Place 1 tablet (0.4 mg total) under the tongue every 5 (  five) minutes as needed for chest pain. 25 tablet 4  . Omega-3 Fatty Acids (FISH OIL) 300 MG CAPS Take 1 capsule by mouth daily.     . simvastatin (ZOCOR) 20 MG tablet TAKE 1 TABLET DAILY 90 tablet 0  . vitamin C (ASCORBIC ACID) 500 MG tablet Take 500 mg by mouth daily.      . vitamin D, CHOLECALCIFEROL, 400 UNITS tablet Take 400 Units by mouth daily.    . vitamin E 100 UNIT capsule Take 100 Units by mouth daily.     No current facility-administered medications for this visit.     Allergies  Review of patient's allergies indicates no known allergies.  Electrocardiogram:   07/13/13  SR rate 62 PR 296  Otherwise normal   10/08/14  SR rate 54  PR 380  Otherwise normal  12/20/15  SB rate 53 PR 384 PVC  Assessment and Plan CAD:  Stable active no chest pain can assess ST segments with ETT AV Block:  Long first degree chronic not on any AV nodal blocking drugs.  ETT to assess integrity of AV conduction and chronotropic cometence Chol:  Labs with primary  Cholesterol is at goal.  Continue current dose of statin and diet Rx.  No myalgias or side effects.  F/U  LFT's in 6 months. Lab Results  Component Value Date   LDLCALC 70 05/24/2010  Right Bruit:  Recent duplex no stenosis f/u [facility] HTN:  Well controlled.  Continue current medications and low sodium Dash type diet.      Jenkins Rouge

## 2015-12-21 NOTE — Patient Instructions (Addendum)
Medication Instructions:  Your physician recommends that you continue on your current medications as directed. Please refer to the Current Medication list given to you today.  Labwork: NONE  Testing/Procedures: Your physician has requested that you have an exercise tolerance test. For further information please visit [email or web address]. Please also follow instruction sheet, as given.  Follow-Up: Your physician wants you to follow-up in: 6 months with Dr. Nishan. You will receive a reminder letter in the mail two months in advance. If you don't receive a letter, please call our office to schedule the follow-up appointment.   If you need a refill on your cardiac medications before your next appointment, please call your pharmacy.    

## 2016-01-11 ENCOUNTER — Inpatient Hospital Stay (HOSPITAL_COMMUNITY)
Admission: EM | Admit: 2016-01-11 | Discharge: 2016-01-12 | DRG: 247 | Disposition: A | Discharge status: Home / Self Care | Payer: Medicare Other | Attending: Cardiovascular Disease | Admitting: Cardiovascular Disease

## 2016-01-11 ENCOUNTER — Encounter (HOSPITAL_COMMUNITY)
Admission: EM | Disposition: A | Discharge status: Home / Self Care | Payer: Self-pay | Source: Home / Self Care | Attending: Cardiovascular Disease

## 2016-01-11 ENCOUNTER — Other Ambulatory Visit: Payer: Self-pay

## 2016-01-11 ENCOUNTER — Ambulatory Visit (INDEPENDENT_AMBULATORY_CARE_PROVIDER_SITE_OTHER): Payer: Medicare Other | Admitting: Cardiovascular Disease

## 2016-01-11 ENCOUNTER — Ambulatory Visit (INDEPENDENT_AMBULATORY_CARE_PROVIDER_SITE_OTHER): Payer: Medicare Other

## 2016-01-11 ENCOUNTER — Encounter: Payer: Self-pay | Admitting: Cardiovascular Disease

## 2016-01-11 ENCOUNTER — Inpatient Hospital Stay: Admission: AD | Admit: 2016-01-11 | Payer: Medicare Other | Source: Ambulatory Visit | Admitting: Cardiovascular Disease

## 2016-01-11 ENCOUNTER — Encounter (HOSPITAL_COMMUNITY): Payer: Self-pay | Admitting: Emergency Medicine

## 2016-01-11 VITALS — BP 153/60 | HR 59 | Ht 66.0 in

## 2016-01-11 DIAGNOSIS — I25118 Atherosclerotic heart disease of native coronary artery with other forms of angina pectoris: Secondary | ICD-10-CM | POA: Diagnosis not present

## 2016-01-11 DIAGNOSIS — E782 Mixed hyperlipidemia: Secondary | ICD-10-CM | POA: Diagnosis present

## 2016-01-11 DIAGNOSIS — I440 Atrioventricular block, first degree: Secondary | ICD-10-CM | POA: Diagnosis present

## 2016-01-11 DIAGNOSIS — I252 Old myocardial infarction: Secondary | ICD-10-CM | POA: Diagnosis not present

## 2016-01-11 DIAGNOSIS — Z8673 Personal history of transient ischemic attack (TIA), and cerebral infarction without residual deficits: Secondary | ICD-10-CM | POA: Diagnosis not present

## 2016-01-11 DIAGNOSIS — Z7982 Long term (current) use of aspirin: Secondary | ICD-10-CM

## 2016-01-11 DIAGNOSIS — I10 Essential (primary) hypertension: Secondary | ICD-10-CM | POA: Diagnosis present

## 2016-01-11 DIAGNOSIS — R9439 Abnormal result of other cardiovascular function study: Secondary | ICD-10-CM

## 2016-01-11 DIAGNOSIS — I2510 Atherosclerotic heart disease of native coronary artery without angina pectoris: Secondary | ICD-10-CM | POA: Diagnosis not present

## 2016-01-11 DIAGNOSIS — I472 Ventricular tachycardia: Secondary | ICD-10-CM | POA: Diagnosis present

## 2016-01-11 DIAGNOSIS — I441 Atrioventricular block, second degree: Secondary | ICD-10-CM | POA: Diagnosis present

## 2016-01-11 DIAGNOSIS — K219 Gastro-esophageal reflux disease without esophagitis: Secondary | ICD-10-CM | POA: Diagnosis present

## 2016-01-11 DIAGNOSIS — I4729 Other ventricular tachycardia: Secondary | ICD-10-CM | POA: Insufficient documentation

## 2016-01-11 DIAGNOSIS — R079 Chest pain, unspecified: Secondary | ICD-10-CM | POA: Diagnosis present

## 2016-01-11 DIAGNOSIS — Z955 Presence of coronary angioplasty implant and graft: Secondary | ICD-10-CM

## 2016-01-11 DIAGNOSIS — I208 Other forms of angina pectoris: Secondary | ICD-10-CM

## 2016-01-11 DIAGNOSIS — I25119 Atherosclerotic heart disease of native coronary artery with unspecified angina pectoris: Principal | ICD-10-CM | POA: Diagnosis present

## 2016-01-11 HISTORY — PX: CARDIAC CATHETERIZATION: SHX172

## 2016-01-11 LAB — TSH: TSH: 4.124 u[IU]/mL (ref 0.350–4.500)

## 2016-01-11 LAB — BRAIN NATRIURETIC PEPTIDE: B NATRIURETIC PEPTIDE 5: 187.7 pg/mL — AB (ref 0.0–100.0)

## 2016-01-11 LAB — CBC WITH DIFFERENTIAL/PLATELET
Basophils Absolute: 0 10*3/uL (ref 0.0–0.1)
Basophils Relative: 0 %
EOS ABS: 0.2 10*3/uL (ref 0.0–0.7)
Eosinophils Relative: 3 %
HEMATOCRIT: 38.6 % — AB (ref 39.0–52.0)
HEMOGLOBIN: 12.3 g/dL — AB (ref 13.0–17.0)
LYMPHS ABS: 1.7 10*3/uL (ref 0.7–4.0)
Lymphocytes Relative: 30 %
MCH: 30.4 pg (ref 26.0–34.0)
MCHC: 31.9 g/dL (ref 30.0–36.0)
MCV: 95.3 fL (ref 78.0–100.0)
MONOS PCT: 11 %
Monocytes Absolute: 0.6 10*3/uL (ref 0.1–1.0)
NEUTROS PCT: 56 %
Neutro Abs: 3.1 10*3/uL (ref 1.7–7.7)
Platelets: 217 10*3/uL (ref 150–400)
RBC: 4.05 MIL/uL — ABNORMAL LOW (ref 4.22–5.81)
RDW: 13.4 % (ref 11.5–15.5)
WBC: 5.5 10*3/uL (ref 4.0–10.5)

## 2016-01-11 LAB — EXERCISE TOLERANCE TEST
CHL CUP MPHR: 134 {beats}/min
CHL CUP RESTING HR STRESS: 53 {beats}/min
CSEPED: 4 min
CSEPEDS: 0 s
CSEPEW: 5.8 METS
Peak HR: 116 {beats}/min
Percent HR: 86 %
RPE: 18

## 2016-01-11 LAB — BASIC METABOLIC PANEL
Anion gap: 9 (ref 5–15)
BUN: 25 mg/dL — AB (ref 6–20)
CHLORIDE: 108 mmol/L (ref 101–111)
CO2: 22 mmol/L (ref 22–32)
CREATININE: 1.29 mg/dL — AB (ref 0.61–1.24)
Calcium: 9.4 mg/dL (ref 8.9–10.3)
GFR calc Af Amer: 56 mL/min — ABNORMAL LOW (ref >60)
GFR calc non Af Amer: 48 mL/min — ABNORMAL LOW (ref >60)
GLUCOSE: 104 mg/dL — AB (ref 65–99)
Potassium: 4.7 mmol/L (ref 3.5–5.1)
Sodium: 139 mmol/L (ref 135–145)

## 2016-01-11 LAB — TROPONIN I: Troponin I: <0.03 ng/mL (ref <0.031)

## 2016-01-11 LAB — PROTIME-INR
INR: 0.99 (ref 0.00–1.49)
INR: 1.27 (ref 0.00–1.49)
PROTHROMBIN TIME: 16.1 s — AB (ref 11.6–15.2)
Prothrombin Time: 13.3 seconds (ref 11.6–15.2)

## 2016-01-11 LAB — POCT ACTIVATED CLOTTING TIME: Activated Clotting Time: 307 seconds

## 2016-01-11 SURGERY — LEFT HEART CATH AND CORONARY ANGIOGRAPHY

## 2016-01-11 MED ORDER — LIDOCAINE HCL (PF) 1 % IJ SOLN
INTRAMUSCULAR | Status: AC
  Filled 2016-01-11: qty 30

## 2016-01-11 MED ORDER — IOPAMIDOL (ISOVUE-370) INJECTION 76%
INTRAVENOUS | Status: DC | PRN
  Administered 2016-01-11: 120 mL via INTRA_ARTERIAL

## 2016-01-11 MED ORDER — SODIUM CHLORIDE 0.9 % IV SOLN: INTRAVENOUS | Status: AC

## 2016-01-11 MED ORDER — ASPIRIN EC 81 MG PO TBEC
81.0000 mg | DELAYED_RELEASE_TABLET | Freq: Every day | ORAL | Status: DC
  Administered 2016-01-12: 08:00:00 81 mg via ORAL
  Filled 2016-01-11: qty 1

## 2016-01-11 MED ORDER — ASPIRIN 300 MG RE SUPP: 300.0000 mg | RECTAL | Status: DC

## 2016-01-11 MED ORDER — HEPARIN BOLUS VIA INFUSION
4000.0000 [IU] | Freq: Once | INTRAVENOUS | Status: AC
  Administered 2016-01-11: 4000 [IU] via INTRAVENOUS
  Filled 2016-01-11: qty 4000

## 2016-01-11 MED ORDER — SIMVASTATIN 20 MG PO TABS
20.0000 mg | ORAL_TABLET | Freq: Every day | ORAL | Status: DC
  Administered 2016-01-11: 20 mg via ORAL
  Filled 2016-01-11 (×2): qty 1

## 2016-01-11 MED ORDER — ONDANSETRON HCL 4 MG/2ML IJ SOLN: 4.0000 mg | Freq: Four times a day (QID) | INTRAMUSCULAR | Status: DC | PRN

## 2016-01-11 MED ORDER — HEPARIN SODIUM (PORCINE) 1000 UNIT/ML IJ SOLN
INTRAMUSCULAR | Status: DC | PRN
  Administered 2016-01-11 (×2): 4000 [IU] via INTRAVENOUS

## 2016-01-11 MED ORDER — SODIUM CHLORIDE 0.9% FLUSH: 3.0000 mL | INTRAVENOUS | Status: DC | PRN

## 2016-01-11 MED ORDER — SODIUM CHLORIDE 0.9 % IV SOLN: 250.0000 mL | INTRAVENOUS | Status: DC | PRN

## 2016-01-11 MED ORDER — FENTANYL CITRATE (PF) 100 MCG/2ML IJ SOLN
INTRAMUSCULAR | Status: AC
  Filled 2016-01-11: qty 2

## 2016-01-11 MED ORDER — CLOPIDOGREL BISULFATE 75 MG PO TABS
75.0000 mg | ORAL_TABLET | Freq: Every day | ORAL | Status: DC
  Administered 2016-01-12: 75 mg via ORAL
  Filled 2016-01-11: qty 1

## 2016-01-11 MED ORDER — ACETAMINOPHEN 325 MG PO TABS: 650.0000 mg | ORAL_TABLET | ORAL | Status: DC | PRN

## 2016-01-11 MED ORDER — HEPARIN (PORCINE) IN NACL 100-0.45 UNIT/ML-% IJ SOLN
950.0000 [IU]/h | INTRAMUSCULAR | Status: DC
  Administered 2016-01-11: 950 [IU]/h via INTRAVENOUS
  Filled 2016-01-11: qty 250

## 2016-01-11 MED ORDER — CLOPIDOGREL BISULFATE 300 MG PO TABS
ORAL_TABLET | ORAL | Status: AC
  Filled 2016-01-11: qty 2

## 2016-01-11 MED ORDER — LIDOCAINE HCL (PF) 1 % IJ SOLN
INTRAMUSCULAR | Status: DC | PRN
  Administered 2016-01-11: 2 mL

## 2016-01-11 MED ORDER — ACETAMINOPHEN 325 MG PO TABS
650.0000 mg | ORAL_TABLET | ORAL | Status: DC | PRN
  Filled 2016-01-11: qty 2

## 2016-01-11 MED ORDER — VERAPAMIL HCL 2.5 MG/ML IV SOLN
INTRAVENOUS | Status: DC | PRN
  Administered 2016-01-11: 10 mL via INTRA_ARTERIAL

## 2016-01-11 MED ORDER — AMLODIPINE BESYLATE 10 MG PO TABS
10.0000 mg | ORAL_TABLET | Freq: Every day | ORAL | Status: DC
  Administered 2016-01-11: 22:00:00 10 mg via ORAL
  Filled 2016-01-11 (×2): qty 1

## 2016-01-11 MED ORDER — ANGIOPLASTY BOOK
Freq: Once | Status: AC
  Administered 2016-01-11: 22:00:00
  Filled 2016-01-11: qty 1

## 2016-01-11 MED ORDER — SODIUM CHLORIDE 0.9% FLUSH: 3.0000 mL | Freq: Two times a day (BID) | INTRAVENOUS | Status: DC

## 2016-01-11 MED ORDER — CLOPIDOGREL BISULFATE 300 MG PO TABS
ORAL_TABLET | ORAL | Status: DC | PRN
  Administered 2016-01-11: 600 mg via ORAL

## 2016-01-11 MED ORDER — HEPARIN SODIUM (PORCINE) 1000 UNIT/ML IJ SOLN
INTRAMUSCULAR | Status: AC
  Filled 2016-01-11: qty 1

## 2016-01-11 MED ORDER — IOPAMIDOL (ISOVUE-370) INJECTION 76%
INTRAVENOUS | Status: AC
  Filled 2016-01-11: qty 100

## 2016-01-11 MED ORDER — HEPARIN (PORCINE) IN NACL 2-0.9 UNIT/ML-% IJ SOLN
INTRAMUSCULAR | Status: DC | PRN
  Administered 2016-01-11: 1500 mL

## 2016-01-11 MED ORDER — FENTANYL CITRATE (PF) 100 MCG/2ML IJ SOLN
INTRAMUSCULAR | Status: DC | PRN
  Administered 2016-01-11: 25 ug via INTRAVENOUS

## 2016-01-11 MED ORDER — HEPARIN (PORCINE) IN NACL 2-0.9 UNIT/ML-% IJ SOLN: INTRAMUSCULAR | Status: DC | PRN

## 2016-01-11 MED ORDER — MIDAZOLAM HCL 2 MG/2ML IJ SOLN
INTRAMUSCULAR | Status: AC
  Filled 2016-01-11: qty 2

## 2016-01-11 MED ORDER — NITROGLYCERIN 0.4 MG SL SUBL: 0.4000 mg | SUBLINGUAL_TABLET | SUBLINGUAL | Status: DC | PRN

## 2016-01-11 MED ORDER — ASPIRIN 81 MG PO CHEW
324.0000 mg | CHEWABLE_TABLET | ORAL | Status: DC
Start: 2016-01-11 — End: 2016-01-11

## 2016-01-11 MED ORDER — MIDAZOLAM HCL 2 MG/2ML IJ SOLN
INTRAMUSCULAR | Status: DC | PRN
  Administered 2016-01-11: 1 mg via INTRAVENOUS

## 2016-01-11 MED ORDER — ASPIRIN 81 MG PO CHEW: 324.0000 mg | CHEWABLE_TABLET | ORAL | Status: DC

## 2016-01-11 SURGICAL SUPPLY — 19 items
BALLN EUPHORA RX 2.5X20 (BALLOONS) ×3
BALLN ~~LOC~~ EMERGE MR 3.75X20 (BALLOONS) ×3
BALLOON EUPHORA RX 2.5X20 (BALLOONS) IMPLANT
BALLOON ~~LOC~~ EMERGE MR 3.75X20 (BALLOONS) IMPLANT
CATH INFINITI JR4 5F (CATHETERS) ×2 IMPLANT
CATH OPTITORQUE JACKY 4.0 5F (CATHETERS) ×2 IMPLANT
CATH VISTA GUIDE 6FR JR4 (CATHETERS) ×2 IMPLANT
DEVICE RAD COMP TR BAND LRG (VASCULAR PRODUCTS) ×2 IMPLANT
GLIDESHEATH SLEND SS 6F .021 (SHEATH) ×2 IMPLANT
KIT ENCORE 26 ADVANTAGE (KITS) ×2 IMPLANT
KIT ESSENTIALS PG (KITS) ×2 IMPLANT
KIT HEART LEFT (KITS) ×3 IMPLANT
PACK CARDIAC CATHETERIZATION (CUSTOM PROCEDURE TRAY) ×3 IMPLANT
STENT RESOLUTE INTEG 3.5X34 (Permanent Stent) ×2 IMPLANT
TRANSDUCER W/STOPCOCK (MISCELLANEOUS) ×3 IMPLANT
TUBING CIL FLEX 10 FLL-RA (TUBING) ×3 IMPLANT
WIRE ROSEN-J .035X260CM (WIRE) ×2 IMPLANT
WIRE RUNTHROUGH .014X180CM (WIRE) ×4 IMPLANT
WIRE SAFE-T 1.5MM-J .035X260CM (WIRE) ×2 IMPLANT

## 2016-01-11 NOTE — ED Provider Notes (Signed)
CSN: [id number]     Arrival date & time 01/11/16  1006 History   First MD Initiated Contact with Patient 01/11/16 1007     Chief Complaint  Patient presents with  . Chest Pain     HPI  Patient presents for evaluation after abnormalities during a stress test this morning. Patient has a history of coronary artery disease areas.  Per patient these are described as "really small blood vessels". He has never had any percutaneous interventions.  He states that he walks his dog is his only form of regular exercise. Occasionally he will get some shortness of breath. He was seen Dr. Jenkins Rouge this morning at [facility] cardiology for treadmill stress test. He states that he exercised until his appropriate heart rate. Per Dr. Johnsie Cancel note the patient had marked EKG abnormalities and a run of NSVT afterwards. Both of which resolved spontaneously. Transferred here by EMS. Was to be a direct admission. No beds available immediately hospital. Thus presents to emergency room via EMS.  Past Medical History  Diagnosis Date  . PREMATURE VENTRICULAR CONTRACTIONS   . HYPERTENSION, UNSPECIFIED   . HYPERLIPIDEMIA-MIXED   . CAROTID ARTERY STENOSIS, WITHOUT INFARCTION   . CAD, NATIVE VESSEL   . AV BLOCK, 1ST DEGREE   . Old myocardial infarction     02/1992  . GERD (gastroesophageal reflux disease)   . Stroke ([place])   . TIA (transient ischemic attack)    Past Surgical History  Procedure Laterality Date  . Hernia repair    . Tonsillectomy    . Cardiac catheterization      x2  . Carotid endarterectomy  11/28/2006    left   Family History  Problem Relation Age of Onset  . Diabetes Mother   . Diabetes Sister   . Cancer Sister 64    colon  . Heart disease Sister    Social History  Substance Use Topics  . Smoking status: Never Smoker   . Smokeless tobacco: Never Used  . Alcohol Use: 9.0 oz/week    15 Glasses of wine per week    Review of Systems  Constitutional: Negative for fever, chills,  diaphoresis, appetite change and fatigue.  HENT: Negative for mouth sores, sore throat and trouble swallowing.   Eyes: Negative for visual disturbance.  Respiratory: Negative for cough, chest tightness, shortness of breath and wheezing.        Occasional dyspnea on exertion. A symptomatically now.  Cardiovascular: Negative for chest pain.  Gastrointestinal: Negative for nausea, vomiting, abdominal pain, diarrhea and abdominal distention.  Endocrine: Negative for polydipsia, polyphagia and polyuria.  Genitourinary: Negative for dysuria, frequency and hematuria.  Musculoskeletal: Negative for gait problem.  Skin: Negative for color change, pallor and rash.  Neurological: Negative for dizziness, syncope, light-headedness and headaches.  Hematological: Does not bruise/bleed easily.  Psychiatric/Behavioral: Negative for behavioral problems and confusion.      Allergies  Review of patient's allergies indicates no known allergies.  Home Medications   Prior to Admission medications   Medication Sig Start Date End Date Taking? Authorizing Provider  amLODipine (NORVASC) 10 MG tablet Take 10 mg by mouth daily.    Historical Provider, MD  aspirin 325 MG tablet Take 325 mg by mouth daily.      Historical Provider, MD  b complex vitamins tablet Take 1 tablet by mouth daily.    Historical Provider, MD  Calcium Carbonate (CALCIUM 500 PO) Take 1 tablet by mouth daily.    Historical Provider, MD  Glucosamine 500 MG CAPS Take 1 capsule by mouth daily.     Historical Provider, MD  hydrochlorothiazide (MICROZIDE) 12.5 MG capsule Take 12.5 mg by mouth daily.    Historical Provider, MD  ibuprofen (ADVIL,MOTRIN) 200 MG tablet Take 200 mg by mouth every 6 (six) hours as needed.    Historical Provider, MD  lisinopril (PRINIVIL,ZESTRIL) 40 MG tablet Take 40 mg by mouth daily.    Historical Provider, MD  nitroGLYCERIN (NITROSTAT) 0.4 MG SL tablet Place 1 tablet (0.4 mg total) under the tongue every 5 (five)  minutes as needed for chest pain. 07/14/13   Josue Hector, MD  Omega-3 Fatty Acids (FISH OIL) 300 MG CAPS Take 1 capsule by mouth daily.     Historical Provider, MD  simvastatin (ZOCOR) 20 MG tablet Take 20 mg by mouth daily.    Historical Provider, MD  vitamin C (ASCORBIC ACID) 500 MG tablet Take 500 mg by mouth daily.      Historical Provider, MD  vitamin D, CHOLECALCIFEROL, 400 UNITS tablet Take 400 Units by mouth daily.    Historical Provider, MD  vitamin E 100 UNIT capsule Take 100 Units by mouth daily.    Historical Provider, MD   SpO2 98% Physical Exam  Constitutional: He is oriented to person, place, and time. He appears well-developed and well-nourished. No distress.  HENT:  Head: Normocephalic.  Eyes: Conjunctivae are normal. Pupils are equal, round, and reactive to light. No scleral icterus.  Neck: Normal range of motion. Neck supple. No thyromegaly present.  Cardiovascular: Normal rate and regular rhythm.  Exam reveals no gallop and no friction rub.   No murmur heard. Pulmonary/Chest: Effort normal and breath sounds normal. No respiratory distress. He has no wheezes. He has no rales.  Abdominal: Soft. Bowel sounds are normal. He exhibits no distension. There is no tenderness. There is no rebound.  Musculoskeletal: Normal range of motion.  Neurological: He is alert and oriented to person, place, and time.  Skin: Skin is warm and dry. No rash noted.  Psychiatric: He has a normal mood and affect. His behavior is normal.    ED Course  Procedures (including critical care time) Labs Review Labs Reviewed  CBC WITH DIFFERENTIAL/PLATELET  BASIC METABOLIC PANEL  PROTIME-INR    Imaging Review No results found. I have personally reviewed and evaluated these images and lab results as part of my medical decision-making.   EKG Interpretation None      MDM   Final diagnoses:  Abnormal stress test  NSVT (nonsustained ventricular tachycardia) (HCC)    Patient is  asymptomatic upon arrival here. Accomplished his goal heart rate during exercise testing this morning. However, develop some mild tightness in his chest which resolved, some ST depressions, and NSVT. Sinus rhythm first-degree AV block urine asymptomatic. Arranging for admission.    Tanna Furry, MD 01/11/16 1257

## 2016-01-11 NOTE — Progress Notes (Signed)
ANTICOAGULATION CONSULT NOTE - Initial Consult  Pharmacy Consult for heparin Indication: chest pain/ACS  No Known Allergies  Patient Measurements:   Heparin Dosing Weight: 79.3 kg  Vital Signs: BP: 153/60 mmHg (05/24 0929) Pulse Rate: 59 (05/24 0929)  Labs:  Recent Labs  01/11/16 1025  HGB 12.3*  HCT 38.6*  PLT 217    CrCl cannot be calculated (Unknown ideal weight.).   Medical History: Past Medical History  Diagnosis Date  . PREMATURE VENTRICULAR CONTRACTIONS   . HYPERTENSION, UNSPECIFIED   . HYPERLIPIDEMIA-MIXED   . CAROTID ARTERY STENOSIS, WITHOUT INFARCTION   . CAD, NATIVE VESSEL   . AV BLOCK, 1ST DEGREE   . Old myocardial infarction     02/1992  . GERD (gastroesophageal reflux disease)   . Stroke ([place])   . TIA (transient ischemic attack)     Assessment: 32 yom with CP during stress test. Pharmacy consulted to dose heparin for ACS. Hg 12.3, plt wnl, no bleed documented. No AC pta.  Goal of Therapy:  Heparin level 0.3-0.7 units/ml Monitor platelets by anticoagulation protocol: Yes   Plan:  Heparin 4000 unit bolus Start heparin at 950 units/h 8h HL Daily HL/CBC Mon s/sx bleeding   Elicia Lamp, PharmD, [facility] Clinical Pharmacist Pager [phone] 01/11/2016 11:04 AM

## 2016-01-11 NOTE — ED Notes (Signed)
Attempted to call report to [phone] 

## 2016-01-11 NOTE — ED Notes (Signed)
Pt arrives via [facility] EMS after chest tightness and ST depression during stress test. Pt was at 116 beats during test, staff noticed VTach and ST depression. Pt has hx of MI and heart cath. Given 324mg  ASA by EMS. A&Ox4, denies CP presently.

## 2016-01-11 NOTE — Interval H&P Note (Signed)
Cath Lab Visit (complete for each Cath Lab visit)  Clinical Evaluation Leading to the Procedure:   ACS: No.  Non-ACS:    Anginal Classification: CCS II  Anti-ischemic medical therapy: Minimal Therapy (1 class of medications)  Non-Invasive Test Results: High-risk stress test findings: cardiac mortality >3%/year  Prior CABG: No previous CABG      History and Physical Interval Note:  01/11/2016 4:10 PM  [name]  has presented today for surgery, with the diagnosis of cp  The various methods of treatment have been discussed with the patient and family. After consideration of risks, benefits and other options for treatment, the patient has consented to  Procedure(s): Left Heart Cath and Coronary Angiography (N/A) as a surgical intervention .  The patient's history has been reviewed, patient examined, no change in status, stable for surgery.  I have reviewed the patient's chart and labs.  Questions were answered to the patient's satisfaction.     [name]

## 2016-01-11 NOTE — Progress Notes (Signed)
Patient ID: [name], male   DOB: [birth year], 82 y.o.   MRN: [id number]   82 y.o.  patient  Previously seen by Dr Verl Blalock .  Has CAD  Last cath 2009    Vascular disease with previous LCEA  Last duplex in January with patent CEA and no significant right sided disease.  Duplex 2/16 wide open left CEA and only 40% right sided plaque   09/2015 Carotid duplex 1-39% bilateral disease   05/14/2008:  Cardiac Cath Findings: RESULTS: Hemodynamics, LV 163/26, Ao 169/108. Coronaries, left main  had luminal irregularities. The LAD was moderately to severely  calcified. There was long proximal 50% stenosis. There were focal mid  tandem 40% lesions. There was diffuse 25% stenosis in the mid and  distal vessel. The apex was subtotally stenosed. It was a large vessel  wrapping the apex. There is a small first diagonal which was branching  and diffusely diseased with subtotal stenosis. The circumflex was  heavily calcified. It was occluded after the ramus intermediate. There  were some bridging collaterals with faint filling of the distal  circumflex and OM. It appeared to be a large vessel. The ramus  intermediate was a very large vessel. There were proximal tandem 30%  lesions. There was mid long 40% stenosis. There was a mid 70% stenosis  following this. The right coronary artery was a dominant vessel. It  was moderately calcified. There was long mid 70-80% stenosis. There  was distal 60% stenosis before the PDA. Posterolateral had small  luminal irregularities. The posterolateral was small with luminal  irregularities. The PDA had long proximal 30-40% stenosis. The EF was  65% and normal.  CONCLUSION:  1. Diffuse coronary plaque.  2. Preserved left ventricular function.   Was at office today for ETT. Ordered due to fatigue , dyspnea and long first degree block to see if he had higher grade HB during exercise  Exercised 4 minutes to HR 116  Had SSCP with salvos of NSVT as long as 10 beats no high grade  AV block. Chest pain subsided 10 minutes  Into recovery   ROS: Denies fever, malais, weight loss, blurry vision, decreased visual acuity, cough, sputum, SOB, hemoptysis, pleuritic pain, palpitaitons, heartburn, abdominal pain, melena, lower extremity edema, claudication, or rash.  All other systems reviewed and negative  General: Affect appropriate Healthy:  appears stated age 82: normal Neck supple with no adenopathy JVP normal Prev LCEA and right  bruits no thyromegaly Lungs clear with no wheezing and good diaphragmatic motion Heart:  S1/S2 aortic sclerosis  murmur, no rub, gallop or click PMI normal Abdomen: benighn, BS positve, no tenderness, no AAA no bruit.  No HSM or HJR Distal pulses intact with no bruits No edema Neuro non-focal Skin warm and dry No muscular weakness   Current Outpatient Prescriptions  Medication Sig Dispense Refill  . amLODipine (NORVASC) 10 MG tablet Take 10 mg by mouth daily.    Marland Kitchen aspirin 325 MG tablet Take 325 mg by mouth daily.      Marland Kitchen b complex vitamins tablet Take 1 tablet by mouth daily.    . Calcium Carbonate (CALCIUM 500 PO) Take 1 tablet by mouth daily.    . Glucosamine 500 MG CAPS Take 1 capsule by mouth daily.     . hydrochlorothiazide (MICROZIDE) 12.5 MG capsule Take 12.5 mg by mouth daily.    Marland Kitchen ibuprofen (ADVIL,MOTRIN) 200 MG tablet Take 200 mg by mouth every 6 (six) hours as needed.    Marland Kitchen  lisinopril (PRINIVIL,ZESTRIL) 40 MG tablet Take 40 mg by mouth daily.    . nitroGLYCERIN (NITROSTAT) 0.4 MG SL tablet Place 1 tablet (0.4 mg total) under the tongue every 5 (five) minutes as needed for chest pain. 25 tablet 4  . Omega-3 Fatty Acids (FISH OIL) 300 MG CAPS Take 1 capsule by mouth daily.     . simvastatin (ZOCOR) 20 MG tablet Take 20 mg by mouth daily.    . vitamin C (ASCORBIC ACID) 500 MG tablet Take 500 mg by mouth daily.      . vitamin D, CHOLECALCIFEROL, 400 UNITS tablet Take 400 Units by mouth daily.    . vitamin E 100 UNIT capsule  Take 100 Units by mouth daily.     No current facility-administered medications for this visit.    Allergies  Review of patient's allergies indicates no known allergies.  Electrocardiogram:   07/13/13  SR rate 62 PR 296  Otherwise normal   10/08/14  SR rate 54  PR 380  Otherwise normal  12/20/15  SB rate 53 PR 384 PVC  Assessment and Plan CAD:  By cat 2009 with markedly abnormal ETT Angina, NSVT and 2 mm ST segment depression. Transfer to hospital by EMS Discussed f/u cath with patient latter today Willing to proceed.   AV Block:  Long first degree chronic not on any AV nodal blocking drugs.  HR reached max 116 during ETT with no AV blcok Chol:  Labs with primary  Cholesterol is at goal.  Continue current dose of statin and diet Rx.  No myalgias or side effects.  F/U  LFT's in 6 months. Lab Results  Component Value Date   LDLCALC 70 05/24/2010  Right Bruit:  Recent duplex no stenosis f/u VVS HTN:  Well controlled.  Continue current medications and low sodium Dash type diet.    Further recommendations based on results of heart cath latter today  Hospital called Telemetry bed arranged Orders written cath lab called   Jenkins Rouge

## 2016-01-11 NOTE — H&P (View-Only) (Signed)
Patient ID: [name], male   DOB: [birth year], 82 y.o.   MRN: [id number]   82 y.o.  patient  Previously seen by Dr Verl Blalock .  Has CAD  Last cath 2009    Vascular disease with previous LCEA  Last duplex in January with patent CEA and no significant right sided disease.  Duplex 2/16 wide open left CEA and only 40% right sided plaque   09/2015 Carotid duplex 1-39% bilateral disease   05/14/2008:  Cardiac Cath Findings: RESULTS: Hemodynamics, LV 163/26, Ao 169/108. Coronaries, left main  had luminal irregularities. The LAD was moderately to severely  calcified. There was long proximal 50% stenosis. There were focal mid  tandem 40% lesions. There was diffuse 25% stenosis in the mid and  distal vessel. The apex was subtotally stenosed. It was a large vessel  wrapping the apex. There is a small first diagonal which was branching  and diffusely diseased with subtotal stenosis. The circumflex was  heavily calcified. It was occluded after the ramus intermediate. There  were some bridging collaterals with faint filling of the distal  circumflex and OM. It appeared to be a large vessel. The ramus  intermediate was a very large vessel. There were proximal tandem 30%  lesions. There was mid long 40% stenosis. There was a mid 70% stenosis  following this. The right coronary artery was a dominant vessel. It  was moderately calcified. There was long mid 70-80% stenosis. There  was distal 60% stenosis before the PDA. Posterolateral had small  luminal irregularities. The posterolateral was small with luminal  irregularities. The PDA had long proximal 30-40% stenosis. The EF was  65% and normal.  CONCLUSION:  1. Diffuse coronary plaque.  2. Preserved left ventricular function.   Was at office today for ETT. Ordered due to fatigue , dyspnea and long first degree block to see if he had higher grade HB during exercise  Exercised 4 minutes to HR 116  Had SSCP with salvos of NSVT as long as 10 beats no high grade  AV block. Chest pain subsided 10 minutes  Into recovery   ROS: Denies fever, malais, weight loss, blurry vision, decreased visual acuity, cough, sputum, SOB, hemoptysis, pleuritic pain, palpitaitons, heartburn, abdominal pain, melena, lower extremity edema, claudication, or rash.  All other systems reviewed and negative  General: Affect appropriate Healthy:  appears stated age 71: normal Neck supple with no adenopathy JVP normal Prev LCEA and right  bruits no thyromegaly Lungs clear with no wheezing and good diaphragmatic motion Heart:  S1/S2 aortic sclerosis  murmur, no rub, gallop or click PMI normal Abdomen: benighn, BS positve, no tenderness, no AAA no bruit.  No HSM or HJR Distal pulses intact with no bruits No edema Neuro non-focal Skin warm and dry No muscular weakness   Current Outpatient Prescriptions  Medication Sig Dispense Refill  . amLODipine (NORVASC) 10 MG tablet Take 10 mg by mouth daily.    Marland Kitchen aspirin 325 MG tablet Take 325 mg by mouth daily.      Marland Kitchen b complex vitamins tablet Take 1 tablet by mouth daily.    . Calcium Carbonate (CALCIUM 500 PO) Take 1 tablet by mouth daily.    . Glucosamine 500 MG CAPS Take 1 capsule by mouth daily.     . hydrochlorothiazide (MICROZIDE) 12.5 MG capsule Take 12.5 mg by mouth daily.    Marland Kitchen ibuprofen (ADVIL,MOTRIN) 200 MG tablet Take 200 mg by mouth every 6 (six) hours as needed.    Marland Kitchen  lisinopril (PRINIVIL,ZESTRIL) 40 MG tablet Take 40 mg by mouth daily.    . nitroGLYCERIN (NITROSTAT) 0.4 MG SL tablet Place 1 tablet (0.4 mg total) under the tongue every 5 (five) minutes as needed for chest pain. 25 tablet 4  . Omega-3 Fatty Acids (FISH OIL) 300 MG CAPS Take 1 capsule by mouth daily.     . simvastatin (ZOCOR) 20 MG tablet Take 20 mg by mouth daily.    . vitamin C (ASCORBIC ACID) 500 MG tablet Take 500 mg by mouth daily.      . vitamin D, CHOLECALCIFEROL, 400 UNITS tablet Take 400 Units by mouth daily.    . vitamin E 100 UNIT capsule  Take 100 Units by mouth daily.     No current facility-administered medications for this visit.    Allergies  Review of patient's allergies indicates no known allergies.  Electrocardiogram:   07/13/13  SR rate 62 PR 296  Otherwise normal   10/08/14  SR rate 54  PR 380  Otherwise normal  12/20/15  SB rate 53 PR 384 PVC  Assessment and Plan CAD:  By cat 2009 with markedly abnormal ETT Angina, NSVT and 2 mm ST segment depression. Transfer to hospital by EMS Discussed f/u cath with patient latter today Willing to proceed.   AV Block:  Long first degree chronic not on any AV nodal blocking drugs.  HR reached max 116 during ETT with no AV blcok Chol:  Labs with primary  Cholesterol is at goal.  Continue current dose of statin and diet Rx.  No myalgias or side effects.  F/U  LFT's in 6 months. Lab Results  Component Value Date   LDLCALC 70 05/24/2010  Right Bruit:  Recent duplex no stenosis f/u VVS HTN:  Well controlled.  Continue current medications and low sodium Dash type diet.    Further recommendations based on results of heart cath latter today  Hospital called Telemetry bed arranged Orders written cath lab called   Jenkins Rouge

## 2016-01-12 ENCOUNTER — Telehealth: Payer: Self-pay | Admitting: Cardiovascular Disease

## 2016-01-12 ENCOUNTER — Encounter (HOSPITAL_COMMUNITY): Payer: Self-pay | Admitting: Cardiovascular Disease

## 2016-01-12 ENCOUNTER — Inpatient Hospital Stay (HOSPITAL_COMMUNITY): Payer: Medicare Other

## 2016-01-12 DIAGNOSIS — R079 Chest pain, unspecified: Secondary | ICD-10-CM

## 2016-01-12 LAB — COMPREHENSIVE METABOLIC PANEL
ALK PHOS: 54 U/L (ref 38–126)
ALT: 19 U/L (ref 17–63)
AST: 22 U/L (ref 15–41)
Albumin: 3.2 g/dL — ABNORMAL LOW (ref 3.5–5.0)
Anion gap: 9 (ref 5–15)
BILIRUBIN TOTAL: 0.3 mg/dL (ref 0.3–1.2)
BUN: 23 mg/dL — ABNORMAL HIGH (ref 6–20)
CALCIUM: 8.8 mg/dL — AB (ref 8.9–10.3)
CO2: 21 mmol/L — AB (ref 22–32)
CREATININE: 1.04 mg/dL (ref 0.61–1.24)
Chloride: 113 mmol/L — ABNORMAL HIGH (ref 101–111)
Glucose, Bld: 100 mg/dL — ABNORMAL HIGH (ref 65–99)
Potassium: 4 mmol/L (ref 3.5–5.1)
SODIUM: 143 mmol/L (ref 135–145)
TOTAL PROTEIN: 5.9 g/dL — AB (ref 6.5–8.1)

## 2016-01-12 LAB — ECHOCARDIOGRAM COMPLETE
Height: 68 in
WEIGHTICAEL: 2737.23 [oz_av]

## 2016-01-12 LAB — CBC
HEMATOCRIT: 34.5 % — AB (ref 39.0–52.0)
HEMOGLOBIN: 11.1 g/dL — AB (ref 13.0–17.0)
MCH: 29.7 pg (ref 26.0–34.0)
MCHC: 32.2 g/dL (ref 30.0–36.0)
MCV: 92.2 fL (ref 78.0–100.0)
Platelets: 208 10*3/uL (ref 150–400)
RBC: 3.74 MIL/uL — ABNORMAL LOW (ref 4.22–5.81)
RDW: 13.5 % (ref 11.5–15.5)
WBC: 5.9 10*3/uL (ref 4.0–10.5)

## 2016-01-12 LAB — LIPID PANEL
Cholesterol: 120 mg/dL (ref 0–200)
HDL: 52 mg/dL (ref >40)
LDL CALC: 48 mg/dL (ref 0–99)
TRIGLYCERIDES: 98 mg/dL (ref <150)
Total CHOL/HDL Ratio: 2.3 RATIO
VLDL: 20 mg/dL (ref 0–40)

## 2016-01-12 LAB — PROTIME-INR
INR: 1.13 (ref 0.00–1.49)
Prothrombin Time: 14.7 seconds (ref 11.6–15.2)

## 2016-01-12 LAB — HEMOGLOBIN A1C
Hgb A1c MFr Bld: 6 % — ABNORMAL HIGH (ref 4.8–5.6)
Mean Plasma Glucose: 126 mg/dL

## 2016-01-12 LAB — TROPONIN I
TROPONIN I: 0.09 ng/mL — AB (ref <0.031)
TROPONIN I: 0.14 ng/mL — AB (ref <0.031)

## 2016-01-12 LAB — MAGNESIUM: Magnesium: 1.9 mg/dL (ref 1.7–2.4)

## 2016-01-12 MED ORDER — CLOPIDOGREL BISULFATE 75 MG PO TABS: 75.0000 mg | ORAL_TABLET | Freq: Every day | ORAL | Status: DC

## 2016-01-12 MED ORDER — NITROGLYCERIN 0.4 MG SL SUBL: 0.4000 mg | SUBLINGUAL_TABLET | SUBLINGUAL | Status: DC | PRN

## 2016-01-12 MED ORDER — ASPIRIN 81 MG PO TBEC: 81.0000 mg | DELAYED_RELEASE_TABLET | Freq: Every day | ORAL | Status: DC

## 2016-01-12 MED ORDER — OXYCODONE HCL 5 MG PO TABS
5.0000 mg | ORAL_TABLET | Freq: Once | ORAL | Status: AC
  Administered 2016-01-12: 05:00:00 5 mg via ORAL
  Filled 2016-01-12: qty 1

## 2016-01-12 NOTE — Discharge Summary (Signed)
Discharge Summary    Patient ID: [name],  MRN: [id number], DOB/AGE: 1927/09/10 82 y.o.  Admit date: 01/11/2016 Discharge date: 01/12/2016  Primary Care Provider: Helane Rima Primary Cardiologist: Dr. Johnsie Cancel  Discharge Diagnoses    Active Problems:   Atherosclerosis of native coronary artery of native heart   AV BLOCK, 1ST DEGREE   Abnormal stress test   NSVT (nonsustained ventricular tachycardia) (HCC)   Allergies No Known Allergies  Diagnostic Studies/Procedures    LHC: 01/11/2016   Procedures    Left Heart Cath and Coronary Angiography    Conclusion     Ost Cx to Prox Cx lesion, 90% stenosed.  Prox Cx to Mid Cx lesion, 100% stenosed.  Prox LAD to Mid LAD lesion, 65% stenosed.  Mid LAD to Dist LAD lesion, 30% stenosed.  Dist LAD lesion, 100% stenosed.  Ost 2nd Diag to 2nd Diag lesion, 90% stenosed.  Ramus-1 lesion, 80% stenosed.  Ramus-2 lesion, 80% stenosed.  Ost Ramus lesion, 50% stenosed.  Post Atrio lesion, 60% stenosed.  Dist RCA lesion, 20% stenosed.  2nd RPLB lesion, 40% stenosed.  Prox RCA to Mid RCA lesion, 90% stenosed. Post intervention, there is a 0% residual stenosis.  1. Significant heavily calcified 3 vessel coronary artery disease. Progression of disease since 2009. The left circumflex is now occluded with right-to-left collaterals. The apical LAD remains occluded and the disease in the proximal segment does not appear to be significant. There is borderline significant disease involving the ramus in multiple areas. Severe stenosis in the proximal and mid right coronary artery.  2. Successful angioplasty and drug-eluting stent placement to the proximal and midright coronary artery.  Recommendations: Although the patient has three-vessel coronary artery disease, the proximal LAD disease does not appear to be significant and the vessel is occluded distally and not a good target for CABG. The LCX is also not a good target and  gets collaterals from the right coronary artery. Thus, I felt that the best management option is PCI on the right coronary artery which was performed successfully. Recommend an echocardiogram to evaluate the systolic function given that I could not cross the aortic valve due to severe tortuosity in the right subclavian and innominate arteries. If catheterization is needed in the future, void the right radial artery. Dual antiplatelet therapy for at least 6 months and preferably 12 months.    Coronary Diagrams    Diagnostic Diagram           Post-Intervention Diagram          _____________   History of Present Illness     [name] is a 82 yo male patient of Dr. Johnsie Cancel with PMH of HTN/HLD/CAD/1st degree AV block/GERD/Stroke/TIA who had noted changes to ST segments on his EKG during the office visit on 12/21/2015. He denied any chest pain at that visit, and was set up for an exercise stress test. Received the stress test on 01/11/2016 with marked abnormal angina along with NSVT and 39mm ST segment depression. He was transferred to the hospital by EMS and follow-up left heart cath was discussed with patient by Dr. Johnsie Cancel.   Hospital Course     He was taken to the [facility] ED due to no immediate bed availability in the hospital, and evaluated by the EDP was admission to telemetry arranged. He was started on a heparin drip. Labs showed an elevation of his troponin levels from 0.092 0.14. His creatinine level was initially 1.29 on admission but  has decreased to 1.04. He underwent a LHC on 01/11/2016 with Dr. Fletcher Anon which revealed significantly heavily calcified 3 vessel disease which has progressed since 2009. Left circumflex was noted to be occluded with left-to-right collaterals. Apical LAD remains occluded but disease in the proximal segment did not appear to be significant. Borderline disease involving the ramus in multiple areas, and severe stenosis in the proximal and mid RCA. He had  successful angioplasty and DES to the proximal and mid RCA. Recommendations were given to continue with DA PT for at least 6 months preferably 12 if possible. Of note proximal LAD disease did not appear to be significant though the vessel is occluded distally and is not a good target for CABG the left circumflex is not a good target and currently gets collaterals from the RCA. Patient received a 2-D echo to evaluate systolic function given this was difficult to obtain during LHC. If further catheterization is needed, suggested to avoid right radial artery, given its tortuosity.  On 01/12/2016 his labs were noted to be stable with a hemoglobin of 11.1, creatinine of 1.04, troponin did peak to 0.14, LDL 48, HDL 52, total cholesterol 120, Hgb A1c 6.0. He was seen and ambulated with cardiac rehabilitation this morning with no complaints of chest pain or dyspnea. Right radial cath site is stable on discharge, with no bruising or hematoma. Vital signs were stable.  Physical Exam:  Affect appropriate Healthy: appears stated age 47: normal Neck supple with no adenopathy JVP normal Prev LCEA and right bruits no thyromegaly Lungs clear with no wheezing and good diaphragmatic motion Heart: S1/S2 aortic sclerosis murmur, no rub, gallop or click PMI normal Abdomen: benighn, BS positve, no tenderness, no AAA no bruit. No HSM or HJR Distal pulses intact with no bruits No edema Neuro non-focal Skin warm and dry No muscular weakness  The patient was seen and assessed by Dr. Gwenlyn Found and determined stable for discharge. I will arrange for [facility] for patient follow-up. Echo was completed prior to discharge with results pending.  _____________  Discharge Vitals Blood pressure 156/63, pulse 71, temperature 97.8 F (36.6 C), temperature source Oral, resp. rate 20, height 5\' 8"  (1.727 m), weight 171 lb 1.2 oz (77.6 kg), SpO2 97 %.  Filed Weights   01/11/16 1100 01/11/16 1508 01/12/16 0345  Weight: 174 lb 13.2  oz (79.3 kg) 171 lb 1.2 oz (77.6 kg) 171 lb 1.2 oz (77.6 kg)    Labs & Radiologic Studies    CBC  Recent Labs  01/11/16 1025 01/12/16 0502  WBC 5.5 5.9  NEUTROABS 3.1  --   HGB 12.3* 11.1*  HCT 38.6* 34.5*  MCV 95.3 92.2  PLT 217 [id number]   Basic Metabolic Panel  Recent Labs  01/11/16 1025 01/12/16 0502  NA 139 143  K 4.7 4.0  CL 108 113*  CO2 22 21*  GLUCOSE 104* 100*  BUN 25* 23*  CREATININE 1.29* 1.04  CALCIUM 9.4 8.8*  MG  --  1.9   Liver Function Tests  Recent Labs  01/12/16 0502  AST 22  ALT 19  ALKPHOS 54  BILITOT 0.3  PROT 5.9*  ALBUMIN 3.2*   Cardiac Enzymes  Recent Labs  01/11/16 1901 01/12/16 0002 01/12/16 0502  TROPONINI <0.03 0.09* 0.14*   Hemoglobin A1C  Recent Labs  01/11/16 1901  HGBA1C 6.0*   Fasting Lipid Panel  Recent Labs  01/12/16 0002  CHOL 120  HDL 52  LDLCALC 48  TRIG 98  CHOLHDL 2.3  Thyroid Function Tests  Recent Labs  01/11/16 1902  TSH 4.124   _____________  No results found. Disposition   Pt is being discharged home today in good condition.  Follow-up Plans & Appointments    Follow-up Information    Follow up with Murray Hodgkins, NP On 01/19/2016.   Specialties:  Nurse Practitioner, Cardiology, Radiology   Why:  10am for your hospital follow up.    Contact information:   [id number] [place] [address] [phone]      Discharge Instructions    AMB Referral to Cardiac Rehabilitation - Phase II    Complete by:  As directed   Diagnosis:   PTCA Stable Angina       Amb Referral to Cardiac Rehabilitation    Complete by:  As directed   Diagnosis:  Coronary Stents           Discharge Medications   Current Discharge Medication List    START taking these medications   Details  aspirin EC 81 MG EC tablet Take 1 tablet (81 mg total) by mouth daily.    clopidogrel (PLAVIX) 75 MG tablet Take 1 tablet (75 mg total) by mouth daily with breakfast. Qty: 30 tablet,  Refills: 11      CONTINUE these medications which have CHANGED   Details  nitroGLYCERIN (NITROSTAT) 0.4 MG SL tablet Place 1 tablet (0.4 mg total) under the tongue every 5 (five) minutes x 3 doses as needed for chest pain. Qty: 25 tablet, Refills: 3      CONTINUE these medications which have NOT CHANGED   Details  amLODipine (NORVASC) 10 MG tablet Take 10 mg by mouth daily.    b complex vitamins tablet Take 1 tablet by mouth daily.    Calcium Carbonate (CALCIUM 500 PO) Take 1 tablet by mouth daily.    Glucosamine 500 MG CAPS Take 1 capsule by mouth daily.     hydrochlorothiazide (MICROZIDE) 12.5 MG capsule Take 12.5 mg by mouth daily.    lisinopril (PRINIVIL,ZESTRIL) 40 MG tablet Take 40 mg by mouth daily.    Omega-3 Fatty Acids (FISH OIL) 300 MG CAPS Take 1 capsule by mouth daily.     simvastatin (ZOCOR) 20 MG tablet Take 20 mg by mouth daily.    vitamin C (ASCORBIC ACID) 500 MG tablet Take 500 mg by mouth daily.      vitamin D, CHOLECALCIFEROL, 400 UNITS tablet Take 400 Units by mouth daily.    vitamin E 100 UNIT capsule Take 100 Units by mouth daily.      STOP taking these medications     aspirin 325 MG tablet      ibuprofen (ADVIL,MOTRIN) 200 MG tablet          Aspirin prescribed at discharge?  Yes High Intensity Statin Prescribed? (Lipitor 40-80mg  or Crestor 20-40mg ): Yes Beta Blocker Prescribed? No: 1 degree AV block For EF <40%, was ACEI/ARB Prescribed? No:  ADP Receptor Inhibitor Prescribed? (i.e. Plavix etc.-Includes Medically Managed Patients): Yes For EF <40%, Aldosterone Inhibitor Prescribed? No:  Was EF assessed during THIS hospitalization? Yes, Echo results pending. Was Cardiac Rehab II ordered? (Included Medically managed Patients): Yes   Outstanding Labs/Studies   None  Duration of Discharge Encounter   Greater than 30 minutes including physician time.  Signed, Reino Bellis NP-C 01/12/2016, 11:43 AM

## 2016-01-12 NOTE — Progress Notes (Signed)
Pt had 6 beat and 5 beat run of vtach at 0323 and 0328, assessed pt, vs stable, pt stable, asymptomatic, pt did not notice the two events.  Notified MD.  Will continue to monitor pt closely.

## 2016-01-12 NOTE — Care Management Note (Signed)
Case Management Note  Patient Details  Name: [name] MRN: [id number] Date of Birth: 10-21-1927  Subjective/Objective:  Patient is from home, s/p intervention , will be on plavix, NCM will cont to follow for dc needs.                   Action/Plan:   Expected Discharge Date:  01/14/16               Expected Discharge Plan:  Home/Self Care  In-House Referral:  Clinical Social Work  Discharge planning Services  CM Consult  Post Acute Care Choice:    Choice offered to:     DME Arranged:    DME Agency:     HH Arranged:    HH Agency:     Status of Service:  Completed, signed off  Medicare Important Message Given:    Date Medicare IM Given:    Medicare IM give by:    Date Additional Medicare IM Given:    Additional Medicare Important Message give by:     If discussed at [place] of Stay Meetings, dates discussed:    Additional Comments:  Zenon Mayo, RN 01/12/2016, 10:49 AM

## 2016-01-12 NOTE — Discharge Instructions (Signed)

## 2016-01-12 NOTE — Progress Notes (Signed)
Echocardiogram 2D Echocardiogram has been performed.  [name] 01/12/2016, 10:58 AM

## 2016-01-12 NOTE — Progress Notes (Signed)
CARDIAC REHAB PHASE I   PRE:  Rate/Rhythm: 75 SR  BP:  Sitting: 116/79        SaO2: 96 RA  MODE:  Ambulation: 500 ft   POST:  Rate/Rhythm: 86 SR  BP:  Sitting: 156/63         SaO2: 96 RA  Pt ambulated 500 ft on RA, handheld assist, steady gait, tolerated well.  Pt c/o mild DOE, denies cp, dizziness, declined rest stop. Completed PCI/stent education.  Reviewed risk factors, anti-platelet therapy, stent card, activity restrictions, ntg, exercise, heart healthy diet, and phase 2 cardiac rehab. Pt verbalized understanding. Pt agrees to phase 2 cardiac rehab referral, will send to [facility] per pt request. Pt to recliner after walk, call bell within reach.  [phone] Lenna Sciara, RN, BSN 01/12/2016 8:26 AM

## 2016-01-12 NOTE — Telephone Encounter (Signed)
TCM per Ria Comment  01/19/16 @ 10am w/ Allie Bossier

## 2016-01-13 NOTE — Telephone Encounter (Signed)
Patient contacted regarding discharge from [facility] on Jan 12, 2016.  Patient understands to follow up with provider Ignacia Bayley, NP on January 19, 2016 at [facility]. Patient understands discharge instructions? yes Patient understands medications and regiment? yes Patient understands to bring all medications to this visit? yes

## 2016-01-19 ENCOUNTER — Ambulatory Visit (HOSPITAL_COMMUNITY)
Admission: RE | Admit: 2016-01-19 | Discharge: 2016-01-19 | Disposition: A | Discharge status: Home / Self Care | Payer: Medicare Other | Source: Ambulatory Visit | Attending: Cardiology | Admitting: Cardiology

## 2016-01-19 ENCOUNTER — Ambulatory Visit (INDEPENDENT_AMBULATORY_CARE_PROVIDER_SITE_OTHER): Payer: Medicare Other | Admitting: Physician Assistant

## 2016-01-19 ENCOUNTER — Encounter: Payer: Self-pay | Admitting: Physician Assistant

## 2016-01-19 VITALS — BP 122/68 | HR 60 | Ht 68.0 in | Wt 172.6 lb

## 2016-01-19 DIAGNOSIS — E785 Hyperlipidemia, unspecified: Secondary | ICD-10-CM | POA: Insufficient documentation

## 2016-01-19 DIAGNOSIS — I2510 Atherosclerotic heart disease of native coronary artery without angina pectoris: Secondary | ICD-10-CM

## 2016-01-19 DIAGNOSIS — I770 Arteriovenous fistula, acquired: Secondary | ICD-10-CM | POA: Insufficient documentation

## 2016-01-19 DIAGNOSIS — Z9889 Other specified postprocedural states: Secondary | ICD-10-CM | POA: Diagnosis not present

## 2016-01-19 DIAGNOSIS — I10 Essential (primary) hypertension: Secondary | ICD-10-CM | POA: Diagnosis not present

## 2016-01-19 DIAGNOSIS — R0989 Other specified symptoms and signs involving the circulatory and respiratory systems: Secondary | ICD-10-CM

## 2016-01-19 DIAGNOSIS — S40021A Contusion of right upper arm, initial encounter: Secondary | ICD-10-CM | POA: Diagnosis not present

## 2016-01-19 DIAGNOSIS — I4729 Other ventricular tachycardia: Secondary | ICD-10-CM

## 2016-01-19 DIAGNOSIS — I6523 Occlusion and stenosis of bilateral carotid arteries: Secondary | ICD-10-CM | POA: Diagnosis not present

## 2016-01-19 DIAGNOSIS — I472 Ventricular tachycardia: Secondary | ICD-10-CM

## 2016-01-19 MED ORDER — CLOPIDOGREL BISULFATE 75 MG PO TABS: 75.0000 mg | ORAL_TABLET | Freq: Every day | ORAL | Status: DC

## 2016-01-19 NOTE — Progress Notes (Signed)
Cardiology Office Note    Date:  01/19/2016   ID:  [name], DOB [birth year], MRN [id number]  PCP:  Helane Rima, MD  Cardiologist:  Dr. Johnsie Cancel  Chief Complaint: Hospital follow up s/p  Atherosclerosis of native coronary artery of native heart due to abnormal stress test   History of Present Illness:   [name] is a 82 y.o. male with PMH of CAD s/p DES to pro & mid RCA, HTN, HLD, 1st degree AV block, GERD, Stroke/TIA and  Carotid artery disease s/p left ECA who presents for hospital follow up.   During stress test on 01/11/2016, the patient noted with marked abnormal angina along with NSVT and 85mm ST segment depression. S/p LHC on 01/11/2016 with Dr. Fletcher Anon which revealed significantly heavily calcified 3 vessel disease which has progressed since 2009. Left circumflex was noted to be occluded with left-to-right collaterals. Apical LAD remains occluded but disease in the proximal segment did not appear to be significant. No good target for CABG. Borderline disease involving the ramus in multiple areas, and severe stenosis in the proximal and mid RCA. He had successful angioplasty and DES to the proximal and mid RCA. If further catheterization is needed, suggested to avoid right radial artery, given its tortuosity. Echo showed LV EF of 60-65%, moderate LVH, grade 1 DD, mild AR. He was discharge on stable condition.    Since discharge he is doing well. The patient denies chest pain, palpitations, shortness of breath, orthopnea, PND, dizziness, syncope, cough, congestion, abdominal pain, hematochezia, melena, lower extremity edema. His right radial hematoma is decreased.    Past Medical History  Diagnosis Date  . PREMATURE VENTRICULAR CONTRACTIONS   . HYPERTENSION, UNSPECIFIED   . HYPERLIPIDEMIA-MIXED   . CAROTID ARTERY STENOSIS, WITHOUT INFARCTION     s/p Left ECA followed by Dr. Donnetta Hutching  . CAD, NATIVE VESSEL     a. LHC 12/2015  3vd Left Cx 100% with colaterals, and distally LAD occluded,  DES to prox-mid RCA  . AV BLOCK, 1ST DEGREE   . Old myocardial infarction     02/1992  . GERD (gastroesophageal reflux disease)   . Stroke ([place])   . TIA (transient ischemic attack)   . NSVT (nonsustained ventricular tachycardia) ([facility])     during stress test 12/2015    Past Surgical History  Procedure Laterality Date  . Hernia repair    . Tonsillectomy    . Cardiac catheterization      x2  . Carotid endarterectomy  11/28/2006    left  . Cardiac catheterization N/A 01/11/2016    Procedure: Left Heart Cath and Coronary Angiography;  Surgeon: Wellington Hampshire, MD;  Location: [facility];  Service: Cardiovascular;  Laterality: N/A;    Current Medications: Prior to Admission medications   Medication Sig Start Date End Date Taking? Authorizing Provider  amLODipine (NORVASC) 10 MG tablet Take 10 mg by mouth daily.    Historical Provider, MD  aspirin EC 81 MG EC tablet Take 1 tablet (81 mg total) by mouth daily. 01/12/16   Cheryln Manly, NP  b complex vitamins tablet Take 1 tablet by mouth daily.    Historical Provider, MD  Calcium Carbonate (CALCIUM 500 PO) Take 1 tablet by mouth daily.    Historical Provider, MD  clopidogrel (PLAVIX) 75 MG tablet Take 1 tablet (75 mg total) by mouth daily with breakfast. 01/12/16   Cheryln Manly, NP  Glucosamine 500 MG CAPS Take 1 capsule by mouth daily.  Historical Provider, MD  hydrochlorothiazide (MICROZIDE) 12.5 MG capsule Take 12.5 mg by mouth daily.    Historical Provider, MD  lisinopril (PRINIVIL,ZESTRIL) 40 MG tablet Take 40 mg by mouth daily.    Historical Provider, MD  nitroGLYCERIN (NITROSTAT) 0.4 MG SL tablet Place 1 tablet (0.4 mg total) under the tongue every 5 (five) minutes x 3 doses as needed for chest pain. 01/12/16   Cheryln Manly, NP  Omega-3 Fatty Acids (FISH OIL) 300 MG CAPS Take 1 capsule by mouth daily.     Historical Provider, MD  simvastatin (ZOCOR) 20 MG tablet Take 20 mg by mouth daily.    Historical Provider,  MD  vitamin C (ASCORBIC ACID) 500 MG tablet Take 500 mg by mouth daily.      Historical Provider, MD  vitamin D, CHOLECALCIFEROL, 400 UNITS tablet Take 400 Units by mouth daily.    Historical Provider, MD  vitamin E 100 UNIT capsule Take 100 Units by mouth daily.    Historical Provider, MD    Allergies:   Review of patient's allergies indicates no known allergies.   Social History   Social History  . Marital Status: Married    Spouse Name: N/A  . Number of Children: N/A  . Years of Education: N/A   Social History Main Topics  . Smoking status: Never Smoker   . Smokeless tobacco: Never Used  . Alcohol Use: 9.0 oz/week    15 Glasses of wine per week  . Drug Use: No  . Sexual Activity: Not Asked   Other Topics Concern  . None   Social History Narrative     Family History:  The patient's family history includes Cancer (age of onset: 77) in his sister; Diabetes in his mother and sister; Heart disease in his sister.   ROS:   Please see the history of present illness.    ROS All other systems reviewed and are negative.   PHYSICAL EXAM:   VS:  BP 122/68 mmHg  Pulse 60  Ht 5\' 8"  (1.727 m)  Wt 172 lb 9.6 oz (78.291 kg)  BMI 26.25 kg/m2  SpO2 97%   GEN: Well nourished, well developed, in no acute distress HEENT: normal Neck: no JVD, bilateral carotid bruits, or masses Cardiac: RRR; no murmurs, rubs, or gallops,no edema  Respiratory:  clear to auscultation bilaterally, normal work of breathing GI: soft, nontender, nondistended, + BS MS: no deformity or atrophy Skin: warm and dry, no rash Neuro:  Alert and Oriented x 3, Strength and sensation are intact Psych: euthymic mood, full affect Extremity: R radial cath site has about 2 cm hematoma with bruit. Non tender to palpation. R forearm bruise.   Wt Readings from Last 3 Encounters:  01/19/16 172 lb 9.6 oz (78.291 kg)  01/12/16 171 lb 1.2 oz (77.6 kg)  12/21/15 174 lb 12.8 oz (79.289 kg)      Studies/Labs Reviewed:    EKG:  EKG is not ordered today.    Recent Labs: 01/11/2016: B Natriuretic Peptide 187.7*; TSH 4.124 01/12/2016: ALT 19; BUN 23*; Creatinine, Ser 1.04; Hemoglobin 11.1*; Magnesium 1.9; Platelets 208; Potassium 4.0; Sodium 143  HgbA1c: 6.0    Lipid Panel    Component Value Date/Time   CHOL 120 01/12/2016 0002   TRIG 98 01/12/2016 0002   HDL 52 01/12/2016 0002   CHOLHDL 2.3 01/12/2016 0002   VLDL 20 01/12/2016 0002   LDLCALC 48 01/12/2016 0002    Additional studies/ records that were reviewed today include:  Echocardiogram: 01/12/16 LV EF: 60% - 65%  ------------------------------------------------------------------- Indications: Chest pain 786.51.  ------------------------------------------------------------------- History: PMH: Coronary artery disease. Transient ischemic attack. Stroke. PMH: Myocardial infarction. Risk factors: Hypertension.  ------------------------------------------------------------------- Study Conclusions  - Left ventricle: The cavity size was normal. Wall thickness was  increased in a pattern of moderate LVH. There was focal basal  hypertrophy. Systolic function was normal. The estimated ejection  fraction was in the range of 60% to 65%. Wall motion was normal;  there were no regional wall motion abnormalities. Doppler  parameters are consistent with abnormal left ventricular  relaxation (grade 1 diastolic dysfunction). - Aortic valve: There was mild regurgitation. - Left atrium: The atrium was moderately dilated.  Cardiac Catheterization: 01/11/16  Left Heart Cath and Coronary Angiography    Conclusion     Ost Cx to Prox Cx lesion, 90% stenosed.  Prox Cx to Mid Cx lesion, 100% stenosed.  Prox LAD to Mid LAD lesion, 65% stenosed.  Mid LAD to Dist LAD lesion, 30% stenosed.  Dist LAD lesion, 100% stenosed.  Ost 2nd Diag to 2nd Diag lesion, 90% stenosed.  Ramus-1 lesion, 80% stenosed.  Ramus-2 lesion,  80% stenosed.  Ost Ramus lesion, 50% stenosed.  Post Atrio lesion, 60% stenosed.  Dist RCA lesion, 20% stenosed.  2nd RPLB lesion, 40% stenosed.  Prox RCA to Mid RCA lesion, 90% stenosed. Post intervention, there is a 0% residual stenosis.  1. Significant heavily calcified 3 vessel coronary artery disease. Progression of disease since 2009. The left circumflex is now occluded with right-to-left collaterals. The apical LAD remains occluded and the disease in the proximal segment does not appear to be significant. There is borderline significant disease involving the ramus in multiple areas. Severe stenosis in the proximal and mid right coronary artery.  2. Successful angioplasty and drug-eluting stent placement to the proximal and midright coronary artery.  Recommendations: Although the patient has three-vessel coronary artery disease, the proximal LAD disease does not appear to be significant and the vessel is occluded distally and not a good target for CABG. The LCX is also not a good target and gets collaterals from the right coronary artery. Thus, I felt that the best management option is PCI on the right coronary artery which was performed successfully. Recommend an echocardiogram to evaluate the systolic function given that I could not cross the aortic valve due to severe tortuosity in the right subclavian and innominate arteries. If catheterization is needed in the future, void the right radial artery. Dual antiplatelet therapy for at least 6 months and preferably 12 months.       ASSESSMENT & PLAN:    1. CAD - S/p DES to proximal and Mid RCA. Left circumflex was noted to be occluded with left-to-right collaterals. Apical LAD is not  good target for CABG. Borderline disease involving the ramus in multiple areas. Echo with EF of 60-65%, moderate LVH, grade 1 DD. No anginal pain or dyspnea. Continue ASA, plavix, statin, ACE. Not on BB due to AV block.   2. HTN - Stable and  well controlled. Continue current regimen.   3. 1st degree AV block - Avoid AV blocking agent.  4. NSVT during stress test   5. R radial hematoma and bruit - Will get ultrasound today --> if abnormal need to f/u with vascular surgery.   6. Carotid artery disease s/p left ECA - Followed by Dr. Donnetta Hutching   Medication Adjustments/Labs and Tests Ordered: Current medicines are reviewed at length with the patient today.  Concerns regarding medicines are outlined above.  Medication changes, Labs and Tests ordered today are listed in the Patient Instructions below. Patient Instructions  Medication Instructions:  Your physician recommends that you continue on your current medications as directed. Please refer to the Current Medication list given to you today.   Labwork: -None  Testing/Procedures: -None  Follow-Up: Your physician recommends that you keep your scheduled  follow-up appointment with Dr. Johnsie Cancel   Any Other Special Instructions Will Be Listed Below (If Applicable).     If you need a refill on your cardiac medications before your next appointment, please call your pharmacy.       Jarrett Soho, [place]  01/19/2016 10:43 AM    [facility] [address], [place], [place]  [address] Phone: [phone]; Fax: [phone]

## 2016-01-19 NOTE — Patient Instructions (Addendum)
Medication Instructions:  Your physician recommends that you continue on your current medications as directed. Please refer to the Current Medication list given to you today. 1. Plavix (75 mg ) [facility]  Labwork: -None  Testing/Procedures: Your physician has requested that you have a lower or upper extremity arterial duplex. This test is an ultrasound of the arteries in the legs or arms. It looks at arterial blood flow in the legs and arms. Allow one hour for Lower and Upper Arterial scans. There are no restrictions or special instructions At [facility] today at 2:15 pm   Follow-Up: Your physician recommends that you keep your scheduled  follow-up appointment with Dr. Johnsie Cancel   Any Other Special Instructions Will Be Listed Below (If Applicable).     If you need a refill on your cardiac medications before your next appointment, please call your pharmacy.

## 2016-03-01 ENCOUNTER — Telehealth: Payer: Self-pay | Admitting: Cardiovascular Disease

## 2016-03-01 NOTE — Telephone Encounter (Signed)
New Message    Pt called stating the he was told that he needs to have a ultrasound on his wrist b/c he had stints placed in his wrist. No order in the computer. Please advise.

## 2016-03-01 NOTE — Telephone Encounter (Signed)
Called and left a voicemail for the patient to call me back and schedule his office visit with Dr. Fletcher Anon.

## 2016-03-01 NOTE — Telephone Encounter (Addendum)
The pt is advised that his prior ultra sound was ok and that he does not need to repeat at this time. He is aware that someone from our [facility] office will be contacting him to arrange date and time of his OV with Dr Fletcher Anon. He verbalized understanding and is in agreement with plan.

## 2016-03-11 ENCOUNTER — Other Ambulatory Visit: Payer: Self-pay | Admitting: Cardiovascular Disease

## 2016-03-20 ENCOUNTER — Telehealth (HOSPITAL_COMMUNITY): Payer: Self-pay | Admitting: Cardiac Rehabilitation

## 2016-03-20 NOTE — Telephone Encounter (Signed)
Third phone attempt to contact pt to enroll in cardiac rehab. Letter mailed to home address.

## 2016-03-20 NOTE — Telephone Encounter (Signed)
Phone call to pt to enroll in cardiac rehab.  Pt declined.  He is not interested in the program.

## 2016-03-21 ENCOUNTER — Encounter: Payer: Self-pay | Admitting: Cardiovascular Disease

## 2016-03-22 ENCOUNTER — Telehealth: Payer: Self-pay | Admitting: Cardiovascular Disease

## 2016-03-22 NOTE — Telephone Encounter (Signed)
Informed patient that he does not need a repeat ultra sound of his wrist. Patient verbalized understanding.

## 2016-03-22 NOTE — Telephone Encounter (Signed)
New message    Pt wants to know if the dr should do a sonogram of his stint. Please call.

## 2016-04-05 NOTE — Progress Notes (Signed)
Patient ID: [name], male   DOB: 15-May-1928, 82 y.o.   MRN: [id number]   82 y.o.  patient  Previously seen by Dr Verl Blalock .  Has CAD  Last cath 2009    Vascular disease with previous LCEA     09/2015 Carotid duplex 1-39% bilateral disease  05/14/2008:  Cardiac Cath Findings: RESULTS: Hemodynamics, LV 163/26, Ao 169/108. Coronaries, left main  had luminal irregularities. The LAD was moderately to severely  calcified. There was long proximal 50% stenosis. There were focal mid  tandem 40% lesions. There was diffuse 25% stenosis in the mid and  distal vessel. The apex was subtotally stenosed. It was a large vessel  wrapping the apex. There is a small first diagonal which was branching  and diffusely diseased with subtotal stenosis. The circumflex was  heavily calcified. It was occluded after the ramus intermediate. There  were some bridging collaterals with faint filling of the distal  circumflex and OM. It appeared to be a large vessel. The ramus  intermediate was a very large vessel. There were proximal tandem 30%  lesions. There was mid long 40% stenosis. There was a mid 70% stenosis  following this. The right coronary artery was a dominant vessel. It  was moderately calcified. There was long mid 70-80% stenosis. There  was distal 60% stenosis before the PDA. Posterolateral had small  luminal irregularities. The posterolateral was small with luminal  irregularities. The PDA had long proximal 30-40% stenosis. The EF was  65% and normal.  CONCLUSION:  1. Diffuse coronary plaque.  2. Preserved left ventricular function.   Was at office 01/11/16  for ETT. Ordered due to fatigue , dyspnea and long first degree block to see if he had higher grade HB during exercise  Exercised 4 minutes to HR 116  Had SSCP with salvos of NSVT as long as 10 beats no high grade AV block. Chest pain subsided 10 minutes Into recovery   Admitted and had cath with DR Fletcher Anon on same day Conclusion    Ost Cx to Prox  Cx lesion, 90% stenosed.  Prox Cx to Mid Cx lesion, 100% stenosed.  Prox LAD to Mid LAD lesion, 65% stenosed.  Mid LAD to Dist LAD lesion, 30% stenosed.  Dist LAD lesion, 100% stenosed.  Ost 2nd Diag to 2nd Diag lesion, 90% stenosed.  Ramus-1 lesion, 80% stenosed.  Ramus-2 lesion, 80% stenosed.  Ost Ramus lesion, 50% stenosed.  Post Atrio lesion, 60% stenosed.  Dist RCA lesion, 20% stenosed.  2nd RPLB lesion, 40% stenosed.  Prox RCA to Mid RCA lesion, 90% stenosed. Post intervention, there is a 0% residual stenosis.   1. Significant heavily calcified 3 vessel coronary artery disease. Progression of disease since 2009. The left circumflex is now occluded with right-to-left collaterals. The apical LAD remains occluded and the disease in the proximal segment does not appear to be significant. There is borderline significant disease involving the ramus in multiple areas. Severe stenosis in the proximal and mid right coronary artery.  2. Successful angioplasty and drug-eluting stent placement to the proximal and midright coronary artery.    Note made of not using right radial again due to tortuosity f/u echo reviewed EF 60-65% with mild AR and moderate LAE  Had some swelling at cath sight and US showed right radial fistula    ROS: Denies fever, malais, weight loss, blurry vision, decreased visual acuity, cough, sputum, SOB, hemoptysis, pleuritic pain, palpitaitons, heartburn, abdominal pain, melena, lower extremity edema, claudication, or rash.  All other systems reviewed and negative  General: Affect appropriate Healthy:  appears stated age 82: normal Neck supple with no adenopathy JVP normal Prev LCEA and right  bruits no thyromegaly Lungs clear with no wheezing and good diaphragmatic motion Heart:  S1/S2 aortic sclerosis  murmur, no rub, gallop or click PMI normal Abdomen: benighn, BS positve, no tenderness, no AAA no bruit.  No HSM or HJR Distal pulses intact  with no bruits No edema Neuro non-focal Skin warm and dry No muscular weakness Right radial prominent with some enlarged veins   Current Outpatient Prescriptions  Medication Sig Dispense Refill  . amLODipine (NORVASC) 10 MG tablet TAKE 1 TABLET DAILY 90 tablet 1  . aspirin EC 81 MG EC tablet Take 1 tablet (81 mg total) by mouth daily.    Marland Kitchen b complex vitamins tablet Take 1 tablet by mouth every other day.     . Calcium Carbonate (CALCIUM 500 PO) Take 1 tablet by mouth daily.    . Cholecalciferol (VITAMIN D-3) 1000 units CAPS Take 1 capsule by mouth daily.    . clopidogrel (PLAVIX) 75 MG tablet Take 1 tablet (75 mg total) by mouth daily with breakfast. 90 tablet 3  . Cyanocobalamin (VITAMIN B-12 PO) Take 1 tablet by mouth every other day.    . Glucosamine 500 MG CAPS Take 1 capsule by mouth daily.     . hydrochlorothiazide (MICROZIDE) 12.5 MG capsule TAKE 1 CAPSULE DAILY 90 capsule 1  . lisinopril (PRINIVIL,ZESTRIL) 40 MG tablet TAKE 1 TABLET DAILY 90 tablet 1  . Multiple Vitamin (MULTIVITAMIN) capsule Take 1 capsule by mouth daily.    . nitroGLYCERIN (NITROSTAT) 0.4 MG SL tablet Place 1 tablet (0.4 mg total) under the tongue every 5 (five) minutes x 3 doses as needed for chest pain. 25 tablet 3  . Omega-3 Fatty Acids (FISH OIL) 1000 MG CAPS Take 1 capsule by mouth daily.    . simvastatin (ZOCOR) 20 MG tablet TAKE 1 TABLET DAILY 90 tablet 1  . vitamin C (ASCORBIC ACID) 500 MG tablet Take 500 mg by mouth daily.      . vitamin E 100 UNIT capsule Take 100 Units by mouth daily.     No current facility-administered medications for this visit.     Allergies  Review of patient's allergies indicates no known allergies.  Electrocardiogram:   07/13/13  SR rate 62 PR 296  Otherwise normal   10/08/14  SR rate 54  PR 380  Otherwise normal  12/20/15  SB rate 53 PR 384 PVC  Assessment and Plan CAD:  Stable no agnina continue medical rx AV Block:  Long first degree chronic not on any AV nodal  blocking drugs.  HR reached max 116 during ETT with no AV blcok Chol:  Labs with primary  Cholesterol is at goal.  Continue current dose of statin and diet Rx.  No myalgias or side effects.  F/U  LFT's in 6 months. Lab Results  Component Value Date   LDLCALC 48 01/12/2016  Right Bruit:  Recent duplex no stenosis f/u VVS HTN:  Well controlled.  Continue current medications and low sodium Dash type diet.    Radial Fistula : has appt with Dr Fletcher Anon in 2 weeks will likely repeat US no symptoms   Jenkins Rouge

## 2016-04-06 ENCOUNTER — Ambulatory Visit (INDEPENDENT_AMBULATORY_CARE_PROVIDER_SITE_OTHER): Payer: Medicare Other | Admitting: Cardiovascular Disease

## 2016-04-06 ENCOUNTER — Encounter: Payer: Self-pay | Admitting: Cardiovascular Disease

## 2016-04-06 VITALS — BP 120/50 | HR 56

## 2016-04-06 DIAGNOSIS — I2583 Coronary atherosclerosis due to lipid rich plaque: Principal | ICD-10-CM

## 2016-04-06 DIAGNOSIS — I208 Other forms of angina pectoris: Secondary | ICD-10-CM

## 2016-04-06 DIAGNOSIS — I2510 Atherosclerotic heart disease of native coronary artery without angina pectoris: Secondary | ICD-10-CM | POA: Diagnosis not present

## 2016-04-06 NOTE — Patient Instructions (Signed)

## 2016-04-17 ENCOUNTER — Ambulatory Visit (INDEPENDENT_AMBULATORY_CARE_PROVIDER_SITE_OTHER): Payer: Medicare Other | Admitting: Cardiovascular Disease

## 2016-04-17 VITALS — BP 141/61 | HR 55 | Ht 66.0 in | Wt 170.6 lb

## 2016-04-17 DIAGNOSIS — I770 Arteriovenous fistula, acquired: Secondary | ICD-10-CM

## 2016-04-17 DIAGNOSIS — I208 Other forms of angina pectoris: Secondary | ICD-10-CM

## 2016-04-17 DIAGNOSIS — I2510 Atherosclerotic heart disease of native coronary artery without angina pectoris: Secondary | ICD-10-CM

## 2016-04-17 NOTE — Patient Instructions (Signed)

## 2016-04-17 NOTE — Progress Notes (Signed)
Cardiology Office Note   Date:  04/17/2016   ID:  Harrell Alikhan, DOB [birth year], MRN [id number]  PCP:  Helane Rima, MD  Cardiologist:   Kathlyn Sacramento, MD   No chief complaint on file.     History of Present Illness: [name] is a 82 y.o. male who was referred by Dr. Johnsie Cancel for evaluation of right radial artery AV fistula after cardiac cath/PCI via the right radial artery. He presented in May 2017 with unstable angina and highly abnormal stress test. I proceeded with cardiac catheterization via the right radial artery which showed significant heavily calcified three-vessel coronary artery disease with chronically occluded left circumflex with right-to-left collaterals and apical LAD occlusion. There was severe RCA disease. I performed successful angioplasty and drug-eluting stent placement to the proximal and midright coronary artery. The procedure overall was difficult due to calcifications and tortuosity of the right subclavian and innominate arteries. The patient did well after the procedure but did have transient swelling in his right hand. Thus, an ultrasound was done which showed right radial artery AV fistula. The swelling resolved since then and he denies any pain . No numbness.   Past Medical History:  Diagnosis Date  . AV BLOCK, 1ST DEGREE   . CAD, NATIVE VESSEL    a. LHC 12/2015  3vd Left Cx 100% with colaterals, and distally LAD occluded, DES to prox-mid RCA  . CAROTID ARTERY STENOSIS, WITHOUT INFARCTION    s/p Left ECA followed by Dr. Donnetta Hutching  . GERD (gastroesophageal reflux disease)   . HYPERLIPIDEMIA-MIXED   . HYPERTENSION, UNSPECIFIED   . NSVT (nonsustained ventricular tachycardia) ([place])    during stress test 12/2015  . Old myocardial infarction    02/1992  . PREMATURE VENTRICULAR CONTRACTIONS   . Stroke ([place])   . TIA (transient ischemic attack)     Past Surgical History:  Procedure Laterality Date  . CARDIAC CATHETERIZATION     x2  . CARDIAC CATHETERIZATION  N/A 01/11/2016   Procedure: Left Heart Cath and Coronary Angiography;  Surgeon: Wellington Hampshire, MD;  Location: [facility];  Service: Cardiovascular;  Laterality: N/A;  . CAROTID ENDARTERECTOMY  11/28/2006   left  . HERNIA REPAIR    . TONSILLECTOMY       Current Outpatient Prescriptions  Medication Sig Dispense Refill  . amLODipine (NORVASC) 10 MG tablet TAKE 1 TABLET DAILY 90 tablet 1  . aspirin EC 81 MG EC tablet Take 1 tablet (81 mg total) by mouth daily.    Marland Kitchen b complex vitamins tablet Take 1 tablet by mouth every other day.     . Calcium Carbonate (CALCIUM 500 PO) Take 1 tablet by mouth daily.    . Cholecalciferol (VITAMIN D-3) 1000 units CAPS Take 1 capsule by mouth daily.    . clopidogrel (PLAVIX) 75 MG tablet Take 1 tablet (75 mg total) by mouth daily with breakfast. 90 tablet 3  . Cyanocobalamin (VITAMIN B-12 PO) Take 1 tablet by mouth every other day.    . Glucosamine 500 MG CAPS Take 1 capsule by mouth daily.     . hydrochlorothiazide (MICROZIDE) 12.5 MG capsule TAKE 1 CAPSULE DAILY 90 capsule 1  . lisinopril (PRINIVIL,ZESTRIL) 40 MG tablet TAKE 1 TABLET DAILY 90 tablet 1  . Multiple Vitamin (MULTIVITAMIN) capsule Take 1 capsule by mouth daily.    . nitroGLYCERIN (NITROSTAT) 0.4 MG SL tablet Place 1 tablet (0.4 mg total) under the tongue every 5 (five) minutes x 3 doses as needed  for chest pain. 25 tablet 3  . Omega-3 Fatty Acids (FISH OIL) 1000 MG CAPS Take 1 capsule by mouth daily.    . simvastatin (ZOCOR) 20 MG tablet TAKE 1 TABLET DAILY 90 tablet 1  . vitamin C (ASCORBIC ACID) 500 MG tablet Take 500 mg by mouth daily.      . vitamin E 100 UNIT capsule Take 100 Units by mouth daily.     No current facility-administered medications for this visit.     Allergies:   Review of patient's allergies indicates no known allergies.    Social History:  The patient  reports that he has never smoked. He has never used smokeless tobacco. He reports that he drinks about 9.0 oz  of alcohol per week . He reports that he does not use drugs.   Family History:  The patient's family history includes Cancer (age of onset: 77) in his sister; Diabetes in his mother and sister; Heart disease in his sister.    ROS:  Please see the history of present illness.   Otherwise, review of systems are positive for we do what.   All other systems are reviewed and negative.    PHYSICAL EXAM: VS:  BP (!) 141/61   Pulse (!) 55   Ht 5\' 6"  (1.676 m)   Wt 170 lb 9.6 oz (77.4 kg)   BMI 27.54 kg/m  , BMI Body mass index is 27.54 kg/m. GEN: Well nourished, well developed, in no acute distress  HEENT: normal  Neck: no JVD, carotid bruits, or masses Cardiac: RRR; no murmurs, rubs, or gallops,no edema  Respiratory:  clear to auscultation bilaterally, normal work of breathing GI: soft, nontender, nondistended, + BS MS: no deformity or atrophy  Skin: warm and dry, no rash Neuro:  Strength and sensation are intact Psych: euthymic mood, full affect There is a thrill and a bruit above the right radial artery.    EKG:  EKG is not ordered today.    Recent Labs: 01/11/2016: B Natriuretic Peptide 187.7; TSH 4.124 01/12/2016: ALT 19; BUN 23; Creatinine, Ser 1.04; Hemoglobin 11.1; Magnesium 1.9; Platelets 208; Potassium 4.0; Sodium 143    Lipid Panel    Component Value Date/Time   CHOL 120 01/12/2016 0002   TRIG 98 01/12/2016 0002   HDL 52 01/12/2016 0002   CHOLHDL 2.3 01/12/2016 0002   VLDL 20 01/12/2016 0002   LDLCALC 48 01/12/2016 0002      Wt Readings from Last 3 Encounters:  04/17/16 170 lb 9.6 oz (77.4 kg)  01/19/16 172 lb 9.6 oz (78.3 kg)  01/12/16 171 lb 1.2 oz (77.6 kg)       No flowsheet data found.    ASSESSMENT AND PLAN:  1.   Right radial artery AV fistula postcardiac catheterization/PCI: The patient is currently completely asymptomatic and he has no swelling or pain. Thus, no indication for intervention. If he becomes symptomatic, it will require surgical  ligation. However, I doubt that this will be the case. The majority of the time these fistulas are small and do not have any long-term effects. Avoid right radial artery cannulation in the future and should probably avoid IV draws in the right hand.  2. Coronary artery disease involving native coronary arteries without angina: He reports no anginal symptoms since his PCI. Continue dual antiplatelet therapy.     Disposition:   FU with me as needed.   Signed,  Kathlyn Sacramento, MD  04/17/2016 9:27 AM    [facility]

## 2016-10-12 ENCOUNTER — Encounter: Payer: Self-pay | Admitting: Family

## 2016-10-17 ENCOUNTER — Ambulatory Visit (INDEPENDENT_AMBULATORY_CARE_PROVIDER_SITE_OTHER): Payer: Medicare Other | Admitting: Physician Assistant

## 2016-10-17 ENCOUNTER — Encounter: Payer: Self-pay | Admitting: Physician Assistant

## 2016-10-17 VITALS — BP 142/50 | HR 56 | Ht 66.0 in | Wt 174.0 lb

## 2016-10-17 DIAGNOSIS — E7849 Other hyperlipidemia: Secondary | ICD-10-CM

## 2016-10-17 DIAGNOSIS — E784 Other hyperlipidemia: Secondary | ICD-10-CM | POA: Diagnosis not present

## 2016-10-17 DIAGNOSIS — I440 Atrioventricular block, first degree: Secondary | ICD-10-CM

## 2016-10-17 DIAGNOSIS — I10 Essential (primary) hypertension: Secondary | ICD-10-CM

## 2016-10-17 DIAGNOSIS — I770 Arteriovenous fistula, acquired: Secondary | ICD-10-CM | POA: Diagnosis not present

## 2016-10-17 DIAGNOSIS — I779 Disorder of arteries and arterioles, unspecified: Secondary | ICD-10-CM

## 2016-10-17 DIAGNOSIS — I2510 Atherosclerotic heart disease of native coronary artery without angina pectoris: Secondary | ICD-10-CM

## 2016-10-17 DIAGNOSIS — I739 Peripheral vascular disease, unspecified: Secondary | ICD-10-CM

## 2016-10-17 NOTE — Patient Instructions (Addendum)

## 2016-10-17 NOTE — Progress Notes (Signed)
Cardiology Office Note:    Date:  10/17/2016   ID:  [name], DOB 06/15/1928, MRN [id number]  PCP:  Helane Rima, MD  Cardiologist:  Dr. Jenkins Rouge   Electrophysiologist:  n/a  Referring MD: Helane Rima, MD   Chief Complaint  Patient presents with  . Follow-up    CAD    History of Present Illness:    [name] is a 83 y.o. male with a hx of CAD, carotid artery disease s/p L CEA, 1st degree AVB, HTN, HL, prior stroke.  He had an abnormal stress test in 5/17 with nonsustained ventricular tachycardia and ST segment depression. He underwent LHC which demonstrated 3 V CAD with an occluded LCx with R-L collaterals, occluded apical LAD, borderline disease in the RI, severe stenosis in the RCA.  He underwent PCI with a DES to the RCA.  He had some proximal LAD disease that did not appear to be significant. It was not a good target for CABG. LCx was also not a good target and there were left-right collaterals. Therefore, residual disease treated medically. Last seen by Dr. Johnsie Cancel in 8/17. He did follow-up with Dr. Fletcher Anon for right radial artery AV fistula that occurred after his heart cath. This was asymptomatic and did not require further intervention.    He returns for follow-up. He is here alone today. Since last seen, he denies chest pain, shortness of breath, syncope, orthopnea, PND or significant pedal edema.   Prior CV studies:   The following studies were reviewed today:  Echo 01/12/16 Moderate LVH, focal basal hypertrophy, EF [id number], grade 1 diastolic dysfunction, mild AI, moderate LAE  LHC 01/11/16 LM normal LAD proximal 65, mid 30, distal 100, D2 90 RI ostial 50 then 80, 80 LCx ostial 90, mid 100 RCA ostial 90, distal 20, posterior AVB 60, RPLB2 40 PCI: 3.5 x 34 mm resolute DES to the RCA   Carotid US 2/17 Patent L CEA with bilateral ICA 1-39  Past Medical History:  Diagnosis Date  . AV BLOCK, 1ST DEGREE   . CAD, NATIVE VESSEL    a. LHC 12/2015  3vd Left Cx  100% with colaterals, and distally LAD occluded, DES to prox-mid RCA  . CAROTID ARTERY STENOSIS, WITHOUT INFARCTION    s/p Left ECA followed by Dr. Donnetta Hutching  . GERD (gastroesophageal reflux disease)   . HYPERLIPIDEMIA-MIXED   . HYPERTENSION, UNSPECIFIED   . NSVT (nonsustained ventricular tachycardia) ([name])    during stress test 12/2015  . Old myocardial infarction    02/1992  . PREMATURE VENTRICULAR CONTRACTIONS   . Stroke ([name])   . TIA (transient ischemic attack)     Past Surgical History:  Procedure Laterality Date  . CARDIAC CATHETERIZATION     x2  . CARDIAC CATHETERIZATION N/A 01/11/2016   Procedure: Left Heart Cath and Coronary Angiography;  Surgeon: Wellington Hampshire, MD;  Location: [facility];  Service: Cardiovascular;  Laterality: N/A;  . CAROTID ENDARTERECTOMY  11/28/2006   left  . HERNIA REPAIR    . TONSILLECTOMY      Current Medications: Current Meds  Medication Sig  . amLODipine (NORVASC) 10 MG tablet TAKE 1 TABLET DAILY  . aspirin EC 81 MG EC tablet Take 1 tablet (81 mg total) by mouth daily.  Marland Kitchen b complex vitamins tablet Take 1 tablet by mouth every other day.   . Calcium Carbonate (CALCIUM 500 PO) Take 1 tablet by mouth daily.  . Cholecalciferol (VITAMIN D-3) 1000 units CAPS Take 1  capsule by mouth daily.  . clopidogrel (PLAVIX) 75 MG tablet Take 1 tablet (75 mg total) by mouth daily with breakfast.  . Cyanocobalamin (VITAMIN B-12 PO) Take 1 tablet by mouth every other day.  . Glucosamine 500 MG CAPS Take 1 capsule by mouth daily.   . hydrochlorothiazide (MICROZIDE) 12.5 MG capsule TAKE 1 CAPSULE DAILY  . lisinopril (PRINIVIL,ZESTRIL) 40 MG tablet TAKE 1 TABLET DAILY  . Multiple Vitamin (MULTIVITAMIN) capsule Take 1 capsule by mouth daily.  . nitroGLYCERIN (NITROSTAT) 0.4 MG SL tablet Place 1 tablet (0.4 mg total) under the tongue every 5 (five) minutes x 3 doses as needed for chest pain.  . Omega-3 Fatty Acids (FISH OIL) 1000 MG CAPS Take 1 capsule by mouth  daily.  . simvastatin (ZOCOR) 20 MG tablet TAKE 1 TABLET DAILY  . vitamin C (ASCORBIC ACID) 500 MG tablet Take 500 mg by mouth daily.    . vitamin E 100 UNIT capsule Take 100 Units by mouth daily.     Allergies:   Patient has no known allergies.   Social History   Social History  . Marital status: Married    Spouse name: N/A  . Number of children: N/A  . Years of education: N/A   Social History Main Topics  . Smoking status: Never Smoker  . Smokeless tobacco: Never Used  . Alcohol use 9.0 oz/week    15 Glasses of wine per week  . Drug use: No  . Sexual activity: Not Asked   Other Topics Concern  . None   Social History Narrative  . None     Family History  Problem Relation Age of Onset  . Diabetes Mother   . Diabetes Sister   . Cancer Sister 5    colon  . Heart disease Sister      ROS:   Please see the history of present illness.    ROS All other systems reviewed and are negative.   EKGs/Labs/Other Test Reviewed:    EKG:  EKG is  ordered today.  The ekg ordered today demonstrates NSR, HR 56, leftward axis, NSSTTW changes, 1st degree AVB (PR 328 ms), QTc 409 ms, no significant changes.    Recent Labs: 01/11/2016: B Natriuretic Peptide 187.7; TSH 4.124 01/12/2016: ALT 19; BUN 23; Creatinine, Ser 1.04; Hemoglobin 11.1; Magnesium 1.9; Platelets 208; Potassium 4.0; Sodium 143   Recent Lipid Panel    Component Value Date/Time   CHOL 120 01/12/2016 0002   TRIG 98 01/12/2016 0002   HDL 52 01/12/2016 0002   CHOLHDL 2.3 01/12/2016 0002   VLDL 20 01/12/2016 0002   LDLCALC 48 01/12/2016 0002     Physical Exam:    VS:  BP (!) 142/50 (BP Location: Left Arm, Patient Position: Sitting, Cuff Size: Normal)   Pulse (!) 56   Ht 5\' 6"  (1.676 m)   Wt 174 lb (78.9 kg)   BMI 28.08 kg/m     Wt Readings from Last 3 Encounters:  10/17/16 174 lb (78.9 kg)  04/17/16 170 lb 9.6 oz (77.4 kg)  01/19/16 172 lb 9.6 oz (78.3 kg)     Physical Exam  Constitutional: He is  oriented to person, place, and time. He appears well-developed and well-nourished. No distress.  HENT:  Head: Normocephalic and atraumatic.  Eyes: No scleral icterus.  Neck: Normal range of motion. No JVD present.  Cardiovascular: Normal rate, regular rhythm, S1 normal and S2 normal.   Murmur heard.  Low-pitched systolic murmur is present with a  grade of 2/6  at the lower left sternal border Pulmonary/Chest: Effort normal and breath sounds normal. He has no wheezes. He has no rhonchi. He has no rales.  Abdominal: Soft. There is no tenderness.  Musculoskeletal: He exhibits no edema.  Neurological: He is alert and oriented to person, place, and time.  Skin: Skin is warm and dry.  Psychiatric: He has a normal mood and affect.    ASSESSMENT:    1. Coronary artery disease involving native coronary artery of native heart without angina pectoris   2. Bilateral carotid artery disease (HCC)   3. AV BLOCK, 1ST DEGREE   4. A-V fistula ([place])   5. Essential hypertension   6. Other hyperlipidemia    PLAN:    In order of problems listed above:  1. CAD - s/p PCI with DES to RCA in 5/17.  He has residual disease with an occluded LCx with R-L collaterals, high grade RI disease, occluded apical LAD that is tx medically.  He denies anginal symptoms.  Continue ASA, Plavix, statin, amlodipine.  He is not on beta-blocker due to long 1st degree AVB.  He would likely benefit from remaining on long term dual antiplatelet Rx.  2. Carotid artery disease - s/p left CEA. He is followed by [facility].  3. First-degree AV block - Asymptomatic.  He is not on AV nodal blocking agents.  4. AV fistula - Complication of cardiac catheterization 5/17 during right radial artery access. This has been asymptomatic and is managed conservatively.  5. HTN - Blood pressure is controlled.  6. HL - LDL in 5/17 was 48. Continue statin.   Medication Adjustments/Labs and Tests Ordered: Current medicines are reviewed at length with  the patient today.  Concerns regarding medicines are outlined above.  Medication changes, Labs and Tests ordered today are outlined in the Patient Instructions noted below. Patient Instructions  Medication Instructions:  Your physician recommends that you continue on your current medications as directed. Please refer to the Current Medication list given to you today.  Labwork: NONE  Testing/Procedures: NONE  Follow-Up: Your physician wants you to follow-up in: [place] DR. Johnsie Cancel You will receive a reminder letter in the mail two months in advance. If you don't receive a letter, please call our office to schedule the follow-up appointment.  Any Other Special Instructions Will Be Listed Below (If Applicable).  If you need a refill on your cardiac medications before your next appointment, please call your pharmacy.   Return in about 6 months (around 04/16/2017) for Routine Follow Up with Dr. Johnsie Cancel .   Signed, Richardson Dopp, PA-C  10/17/2016 3:25 PM    [facility] [address], [place], [place]  [address] Phone: [phone]; Fax: [phone]

## 2016-10-18 ENCOUNTER — Encounter (HOSPITAL_COMMUNITY): Payer: Medicare Other

## 2016-10-18 ENCOUNTER — Ambulatory Visit: Payer: Medicare Other | Admitting: Family

## 2016-10-25 ENCOUNTER — Other Ambulatory Visit: Payer: Self-pay | Admitting: Cardiovascular Disease

## 2016-11-26 ENCOUNTER — Encounter: Payer: Self-pay | Admitting: Family

## 2016-12-06 ENCOUNTER — Encounter: Payer: Self-pay | Admitting: Family

## 2016-12-06 ENCOUNTER — Ambulatory Visit (INDEPENDENT_AMBULATORY_CARE_PROVIDER_SITE_OTHER): Payer: Medicare Other | Admitting: Family

## 2016-12-06 ENCOUNTER — Ambulatory Visit (HOSPITAL_COMMUNITY)
Admission: RE | Admit: 2016-12-06 | Discharge: 2016-12-06 | Disposition: A | Discharge status: Home / Self Care | Payer: Medicare Other | Source: Ambulatory Visit | Attending: Family | Admitting: Family

## 2016-12-06 VITALS — BP 185/93 | HR 51 | Temp 96.7°F | Resp 18 | Ht 66.0 in | Wt 171.0 lb

## 2016-12-06 DIAGNOSIS — I6523 Occlusion and stenosis of bilateral carotid arteries: Secondary | ICD-10-CM | POA: Insufficient documentation

## 2016-12-06 DIAGNOSIS — Z9889 Other specified postprocedural states: Secondary | ICD-10-CM | POA: Diagnosis not present

## 2016-12-06 DIAGNOSIS — Z48812 Encounter for surgical aftercare following surgery on the circulatory system: Secondary | ICD-10-CM | POA: Diagnosis not present

## 2016-12-06 NOTE — Progress Notes (Signed)
Chief Complaint: Follow up Extracranial Carotid Artery Stenosis   History of Present Illness  [name] is a 83 y.o. male patient of Dr. Donnetta Hutching who is status post left CEA in April 2008. He returns today for follow up. He was hospitalized at [facility] in 2008, prior to the CEA, for right hand shaking, he was told that he had a mild stroke. The patient denies any history of other TIA or stroke symptoms other than the right hand shaking, specifically the patient denies a history of amaurosis fugax or monocular blindness, denies a history unilateral of facial drooping, denies a history of hemiplegia, and denies a history of receptive or expressive aphasia.  He has had no subsequent stroke or TIA.   He had cardiac stent placed on 12/2015, DES to prox-mid RCA; 3vd Left Cx 100% has collaterals, and distally LAD was occluded.  He denies having any symptoms prior to the stent placed, denies feeling any different after stent placed. Suspicion for coronary artery stenosis was found on routine cardiac evaluation stress test.  He also has a hx of 1st degree AVB.  He denies any dyspnea or chest pain.   He drinks 3-4 glasses of white wine daily.  He walks about 30 minutes daily. He denies claudication symptoms, denies non-healing wounds. His wife had treatment for breast cancer, seems cancer free now per pt.   Pt Diabetic: No Pt smoker: non-smoker, he was exposed to his wife's second hand smoke for 50 years. His wife no longer smokes.   Pt meds include: Statin : Yes ASA: Yes Other anticoagulants/antiplatelets: Plavix added after cardiac stent placed in May 2017   Past Medical History:  Diagnosis Date  . AV BLOCK, 1ST DEGREE   . CAD, NATIVE VESSEL    a. LHC 12/2015  3vd Left Cx 100% with colaterals, and distally LAD occluded, DES to prox-mid RCA  . CAROTID ARTERY STENOSIS, WITHOUT INFARCTION    s/p Left ECA followed by Dr. Donnetta Hutching  . GERD (gastroesophageal reflux disease)   .  HYPERLIPIDEMIA-MIXED   . HYPERTENSION, UNSPECIFIED   . NSVT (nonsustained ventricular tachycardia) ([place])    during stress test 12/2015  . Old myocardial infarction    02/1992  . PREMATURE VENTRICULAR CONTRACTIONS   . Stroke ([place])   . TIA (transient ischemic attack)     Social History Social History  Substance Use Topics  . Smoking status: Never Smoker  . Smokeless tobacco: Never Used  . Alcohol use 9.0 oz/week    15 Glasses of wine per week    Family History Family History  Problem Relation Age of Onset  . Diabetes Mother   . Diabetes Sister   . Cancer Sister 65    colon  . Heart disease Sister     Surgical History Past Surgical History:  Procedure Laterality Date  . CARDIAC CATHETERIZATION     x2  . CARDIAC CATHETERIZATION N/A 01/11/2016   Procedure: Left Heart Cath and Coronary Angiography;  Surgeon: Wellington Hampshire, MD;  Location: [facility];  Service: Cardiovascular;  Laterality: N/A;  . CAROTID ENDARTERECTOMY  11/28/2006   left  . HERNIA REPAIR    . TONSILLECTOMY      No Known Allergies  Current Outpatient Prescriptions  Medication Sig Dispense Refill  . amLODipine (NORVASC) 10 MG tablet Take 1 tablet (10 mg total) by mouth daily. 90 tablet 3  . aspirin EC 81 MG EC tablet Take 1 tablet (81 mg total) by mouth daily.    Marland Kitchen  b complex vitamins tablet Take 1 tablet by mouth every other day.     . Calcium Carbonate (CALCIUM 500 PO) Take 1 tablet by mouth daily.    . Cholecalciferol (VITAMIN D-3) 1000 units CAPS Take 1 capsule by mouth daily.    . clopidogrel (PLAVIX) 75 MG tablet Take 1 tablet (75 mg total) by mouth daily with breakfast. 90 tablet 3  . Cyanocobalamin (VITAMIN B-12 PO) Take 1 tablet by mouth every other day.    . Glucosamine 500 MG CAPS Take 1 capsule by mouth daily.     . hydrochlorothiazide (MICROZIDE) 12.5 MG capsule Take 1 capsule (12.5 mg total) by mouth daily. 90 capsule 3  . lisinopril (PRINIVIL,ZESTRIL) 40 MG tablet Take 1 tablet (40  mg total) by mouth daily. 90 tablet 3  . Multiple Vitamin (MULTIVITAMIN) capsule Take 1 capsule by mouth daily.    . nitroGLYCERIN (NITROSTAT) 0.4 MG SL tablet Place 1 tablet (0.4 mg total) under the tongue every 5 (five) minutes x 3 doses as needed for chest pain. 25 tablet 3  . Omega-3 Fatty Acids (FISH OIL) 1000 MG CAPS Take 1 capsule by mouth daily.    . simvastatin (ZOCOR) 20 MG tablet Take 1 tablet (20 mg total) by mouth daily. 90 tablet 3  . traMADol (ULTRAM) 50 MG tablet take 1 tablet by mouth every 6 hours if needed for pain  0  . vitamin C (ASCORBIC ACID) 500 MG tablet Take 500 mg by mouth daily.      . vitamin E 100 UNIT capsule Take 100 Units by mouth daily.    . cephALEXin (KEFLEX) 500 MG capsule      No current facility-administered medications for this visit.     Review of Systems : See HPI for pertinent positives and negatives.  Physical Examination  Vitals:   12/06/16 0908 12/06/16 0910 12/06/16 0912  BP: (!) 145/69 (!) 143/70 (!) 185/93  Pulse: (!) 50 (!) 51   Resp: 18    Temp: (!) 96.7 F (35.9 C)    SpO2: 97%    Weight: 171 lb (77.6 kg)    Height: 5\' 6"  (1.676 m)     Body mass index is 27.6 kg/m.  General: WDWN male in NAD GAIT: normal Eyes: PERRLA Pulmonary: Respirations are non-labored, CTAB Cardiac: regular rhythm, no detected murmur.  VASCULAR EXAM Carotid Bruits Left Right   positive Negative   Aorta is not palpable. Radial pulses are 2+ palpable and equal.      LE Pulses LEFT RIGHT   POPLITEAL not palpable  not palpable  DORSALIS PEDIS       Faintly palpable 2+ palpable  POSTERIOR TIBIAL 1+ palpable 1+ palpable    Gastrointestinal: soft, nontender, BS WNL, no r/g, no palpable masses.  Musculoskeletal: No muscle atrophy/wasting. M/S 5/5 throughout, Extremities without  ischemic changes.  Neurologic: A&O X 3; Appropriate Affect, Speech is normal CN 2-12 intact, pain and light touch intact in extremities, Motor exam as listed above.    Assessment: [name] is a 83 y.o. male who is status post left CEA in April 2008. He had a preoperative stoke; residual neurological deficit is tremor of the right hand, no subsequent stroke or TIA.  Fortunately he does not have DM and has never used tobacco, but he was exposed for 50 years to his wife's smoking in their house.Marland Kitchen    DATA Today's carotid duplex suggests a patent left CEA site with 1-39% bilateral ICA stenosis. Bilateral vertebral artery flow  is antegrade.  Bilateral subclavian artery waveforms are normal.  No significant change compared to exam of 10/18/2015.    Plan:  Follow-up in 1 year with Carotid Duplex scan  I discussed in depth with the patient the nature of atherosclerosis, and emphasized the importance of maximal medical management including strict control of blood pressure, blood glucose, and lipid levels, obtaining regular exercise, and continued cessation of smoking.  The patient is aware that without maximal medical management the underlying atherosclerotic disease process will progress, limiting the benefit of any interventions. The patient was given information about stroke prevention and what symptoms should prompt the patient to seek immediate medical care. Thank you for allowing [place] to participate in this patient's care.  Clemon Chambers, RN, MSN, FNP-C [facility] Office: [phone]  Clinic Physician: Oneida Alar  12/06/16 9:16 AM

## 2016-12-06 NOTE — Patient Instructions (Signed)
Stroke Prevention Some medical conditions and behaviors are associated with an increased chance of having a stroke. You may prevent a stroke by making healthy choices and managing medical conditions. How can I reduce my risk of having a stroke?  Stay physically active. Get at least 30 minutes of activity on most or all days.  Do not smoke. It may also be helpful to avoid exposure to secondhand smoke.  Limit alcohol use. Moderate alcohol use is considered to be:  No more than 2 drinks per day for men.  No more than 1 drink per day for nonpregnant women.  Eat healthy foods. This involves:  Eating 5 or more servings of fruits and vegetables a day.  Making dietary changes that address high blood pressure (hypertension), high cholesterol, diabetes, or obesity.  Manage your cholesterol levels.  Making food choices that are high in fiber and low in saturated fat, trans fat, and cholesterol may control cholesterol levels.  Take any prescribed medicines to control cholesterol as directed by your health care provider.  Manage your diabetes.  Controlling your carbohydrate and sugar intake is recommended to manage diabetes.  Take any prescribed medicines to control diabetes as directed by your health care provider.  Control your hypertension.  Making food choices that are low in salt (sodium), saturated fat, trans fat, and cholesterol is recommended to manage hypertension.  Ask your health care provider if you need treatment to lower your blood pressure. Take any prescribed medicines to control hypertension as directed by your health care provider.  If you are 18-39 years of age, have your blood pressure checked every 3-5 years. If you are 40 years of age or older, have your blood pressure checked every year.  Maintain a healthy weight.  Reducing calorie intake and making food choices that are low in sodium, saturated fat, trans fat, and cholesterol are recommended to manage  weight.  Stop drug abuse.  Avoid taking birth control pills.  Talk to your health care provider about the risks of taking birth control pills if you are over 35 years old, smoke, get migraines, or have ever had a blood clot.  Get evaluated for sleep disorders (sleep apnea).  Talk to your health care provider about getting a sleep evaluation if you snore a lot or have excessive sleepiness.  Take medicines only as directed by your health care provider.  For some people, aspirin or blood thinners (anticoagulants) are helpful in reducing the risk of forming abnormal blood clots that can lead to stroke. If you have the irregular heart rhythm of atrial fibrillation, you should be on a blood thinner unless there is a good reason you cannot take them.  Understand all your medicine instructions.  Make sure that other conditions (such as anemia or atherosclerosis) are addressed. Get help right away if:  You have sudden weakness or numbness of the face, arm, or leg, especially on one side of the body.  Your face or eyelid droops to one side.  You have sudden confusion.  You have trouble speaking (aphasia) or understanding.  You have sudden trouble seeing in one or both eyes.  You have sudden trouble walking.  You have dizziness.  You have a loss of balance or coordination.  You have a sudden, severe headache with no known cause.  You have new chest pain or an irregular heartbeat. Any of these symptoms may represent a serious problem that is an emergency. Do not wait to see if the symptoms will go away.   Get medical help at once. Call your local emergency services (911 in [place] Do not drive yourself to the hospital. This information is not intended to replace advice given to you by your health care provider. Make sure you discuss any questions you have with your health care provider. Document Released: 09/13/2004 Document Revised: 01/12/2016 Document Reviewed: 02/06/2013 Elsevier  Interactive Patient Education  2017 Else[employer] Inc.      Preventing Cerebrovascular Disease Arteries are blood vessels that carry blood that contains oxygen from the heart to all parts of the body. Cerebrovascular disease affects arteries that supply the brain. Any condition that blocks or disrupts blood flow to the brain can cause cerebrovascular disease. Brain cells that lose blood supply start to die within minutes (stroke). Stroke is the main danger of cerebrovascular disease. Atherosclerosis and high blood pressure are common causes of cerebrovascular disease. Atherosclerosis is narrowing and hardening of an artery that results when fat, cholesterol, calcium, or other substances (plaque) build up inside an artery. Plaque reduces blood flow through the artery. High blood pressure increases the risk of bleeding inside the brain. Making diet and lifestyle changes to prevent atherosclerosis and high blood pressure lowers your risk of cerebrovascular disease. What nutrition changes can be made?  Eat more fruits, vegetables, and whole grains.  Reduce how much saturated fat you eat. To do this, eat less red meat and fewer full-fat dairy products.  Eat healthy proteins instead of red meat. Healthy proteins include:  Fish. Eat fish that contains heart-healthy omega-3 fatty acids, twice a week. Examples include salmon, albacore tuna, mackerel, and herring.  Chicken.  Nuts.  Low-fat or nonfat yogurt.  Avoid processed meats, like bacon and lunchmeat.  Avoid foods that contain:  A lot of sugar, such as sweets and drinks with added sugar.  A lot of salt (sodium). Avoid adding extra salt to your food, as told by your health care provider.  Trans fats, such as margarine and baked goods. Trans fats may be listed as "partially hydrogenated oils" on food labels.  Check food labels to see how much sodium, sugar, and trans fats are in foods.  Use vegetable oils that contain low amounts of  saturated fat, such as olive oil or canola oil. What lifestyle changes can be made?  Drink alcohol in moderation. This means no more than 1 drink a day for nonpregnant women and 2 drinks a day for men. One drink equals 12 oz of beer, 5 oz of wine, or 1 oz of hard liquor.  If you are overweight, ask your health care provider to recommend a weight-loss plan for you. Losing 5-10 lb (2.2-4.5 kg) can reduce your risk of diabetes, atherosclerosis, and high blood pressure.  Exercise for 30?60 minutes on most days, or as much as told by your health care provider.  Do moderate-intensity exercise, such as brisk walking, bicycling, and water aerobics. Ask your health care provider which activities are safe for you.  Do not use any products that contain nicotine or tobacco, such as cigarettes and e-cigarettes. If you need help quitting, ask your health care provider. Why are these changes important? Making these changes lowers your risk of many diseases that can cause cerebrovascular disease and stroke. Stroke is a leading cause of death and disability. Making these changes also improves your overall health and quality of life. What can I do to lower my risk? The following factors make you more likely to develop cerebrovascular disease:  Being overweight.  Smoking.  Being physically   inactive.  Eating a high-fat diet.  Having certain health conditions, such as:  Diabetes.  High blood pressure.  Heart disease.  Atherosclerosis.  High cholesterol.  Sickle cell disease. Talk with your health care provider about your risk for cerebrovascular disease. Work with your health care provider to control diseases that you have that may contribute to cerebrovascular disease. Your health care provider may prescribe medicines to help prevent major causes of cerebrovascular disease. Where to find more information: Learn more about preventing cerebrovascular disease from:  National Heart, Lung, and  Blood Institute: www.nhlbi.nih.gov/health/health-topics/topics/stroke  Centers for Disease Control and Prevention: cdc.gov/stroke/about.htm Summary  Cerebrovascular disease can lead to a stroke.  Atherosclerosis and high blood pressure are major causes of cerebrovascular disease.  Making diet and lifestyle changes can reduce your risk of cerebrovascular disease.  Work with your health care provider to get your risk factors under control to reduce your risk of cerebrovascular disease. This information is not intended to replace advice given to you by your health care provider. Make sure you discuss any questions you have with your health care provider. Document Released: 08/21/2015 Document Revised: 02/24/2016 Document Reviewed: 08/21/2015 Elsevier Interactive Patient Education  2017 Elsevier Inc.  

## 2016-12-07 NOTE — Addendum Note (Signed)
Addended by: Lianne Cure A on: 12/07/2016 04:23 PM   Modules accepted: Orders

## 2016-12-26 ENCOUNTER — Other Ambulatory Visit: Payer: Self-pay | Admitting: Nurse Practitioner

## 2016-12-26 DIAGNOSIS — R0989 Other specified symptoms and signs involving the circulatory and respiratory systems: Secondary | ICD-10-CM

## 2017-02-20 ENCOUNTER — Observation Stay (HOSPITAL_COMMUNITY)
Admission: EM | Admit: 2017-02-20 | Discharge: 2017-02-21 | Disposition: A | Discharge status: Home / Self Care | Payer: Medicare Other | Source: Home / Self Care | Attending: Emergency Medicine | Admitting: Emergency Medicine

## 2017-02-20 ENCOUNTER — Emergency Department (HOSPITAL_COMMUNITY): Payer: Medicare Other

## 2017-02-20 ENCOUNTER — Encounter (HOSPITAL_COMMUNITY): Payer: Self-pay | Admitting: Emergency Medicine

## 2017-02-20 DIAGNOSIS — R0789 Other chest pain: Secondary | ICD-10-CM

## 2017-02-20 DIAGNOSIS — R001 Bradycardia, unspecified: Secondary | ICD-10-CM

## 2017-02-20 DIAGNOSIS — I10 Essential (primary) hypertension: Secondary | ICD-10-CM | POA: Diagnosis present

## 2017-02-20 DIAGNOSIS — I25110 Atherosclerotic heart disease of native coronary artery with unstable angina pectoris: Secondary | ICD-10-CM

## 2017-02-20 DIAGNOSIS — E785 Hyperlipidemia, unspecified: Secondary | ICD-10-CM | POA: Diagnosis present

## 2017-02-20 DIAGNOSIS — Z955 Presence of coronary angioplasty implant and graft: Secondary | ICD-10-CM

## 2017-02-20 DIAGNOSIS — I441 Atrioventricular block, second degree: Secondary | ICD-10-CM | POA: Diagnosis present

## 2017-02-20 DIAGNOSIS — I2510 Atherosclerotic heart disease of native coronary artery without angina pectoris: Secondary | ICD-10-CM | POA: Diagnosis present

## 2017-02-20 DIAGNOSIS — R0989 Other specified symptoms and signs involving the circulatory and respiratory systems: Secondary | ICD-10-CM

## 2017-02-20 DIAGNOSIS — Z8679 Personal history of other diseases of the circulatory system: Secondary | ICD-10-CM | POA: Insufficient documentation

## 2017-02-20 DIAGNOSIS — Z8673 Personal history of transient ischemic attack (TIA), and cerebral infarction without residual deficits: Secondary | ICD-10-CM | POA: Insufficient documentation

## 2017-02-20 DIAGNOSIS — I200 Unstable angina: Secondary | ICD-10-CM | POA: Diagnosis present

## 2017-02-20 DIAGNOSIS — I25119 Atherosclerotic heart disease of native coronary artery with unspecified angina pectoris: Secondary | ICD-10-CM | POA: Diagnosis present

## 2017-02-20 DIAGNOSIS — I739 Peripheral vascular disease, unspecified: Secondary | ICD-10-CM

## 2017-02-20 DIAGNOSIS — Z79899 Other long term (current) drug therapy: Secondary | ICD-10-CM

## 2017-02-20 DIAGNOSIS — I779 Disorder of arteries and arterioles, unspecified: Secondary | ICD-10-CM | POA: Diagnosis present

## 2017-02-20 DIAGNOSIS — Z7982 Long term (current) use of aspirin: Secondary | ICD-10-CM | POA: Insufficient documentation

## 2017-02-20 DIAGNOSIS — R079 Chest pain, unspecified: Secondary | ICD-10-CM

## 2017-02-20 DIAGNOSIS — Z7901 Long term (current) use of anticoagulants: Secondary | ICD-10-CM | POA: Insufficient documentation

## 2017-02-20 DIAGNOSIS — I700 Atherosclerosis of aorta: Secondary | ICD-10-CM

## 2017-02-20 HISTORY — DX: Essential (primary) hypertension: I10

## 2017-02-20 HISTORY — DX: Disorder of arteries and arterioles, unspecified: I77.9

## 2017-02-20 HISTORY — DX: Peripheral vascular disease, unspecified: I73.9

## 2017-02-20 HISTORY — DX: Hyperlipidemia, unspecified: E78.5

## 2017-02-20 LAB — I-STAT TROPONIN, ED
TROPONIN I, POC: 0.01 ng/mL (ref 0.00–0.08)
Troponin i, poc: 0.01 ng/mL (ref 0.00–0.08)

## 2017-02-20 LAB — BASIC METABOLIC PANEL
Anion gap: 9 (ref 5–15)
BUN: 36 mg/dL — AB (ref 6–20)
CHLORIDE: 111 mmol/L (ref 101–111)
CO2: 22 mmol/L (ref 22–32)
Calcium: 9.3 mg/dL (ref 8.9–10.3)
Creatinine, Ser: 1.23 mg/dL (ref 0.61–1.24)
GFR calc Af Amer: 59 mL/min — ABNORMAL LOW (ref >60)
GFR calc non Af Amer: 51 mL/min — ABNORMAL LOW (ref >60)
Glucose, Bld: 115 mg/dL — ABNORMAL HIGH (ref 65–99)
POTASSIUM: 3.8 mmol/L (ref 3.5–5.1)
SODIUM: 142 mmol/L (ref 135–145)

## 2017-02-20 LAB — CBC
HEMATOCRIT: 36.8 % — AB (ref 39.0–52.0)
Hemoglobin: 11.9 g/dL — ABNORMAL LOW (ref 13.0–17.0)
MCH: 31 pg (ref 26.0–34.0)
MCHC: 32.3 g/dL (ref 30.0–36.0)
MCV: 95.8 fL (ref 78.0–100.0)
Platelets: 177 10*3/uL (ref 150–400)
RBC: 3.84 MIL/uL — AB (ref 4.22–5.81)
RDW: 13.4 % (ref 11.5–15.5)
WBC: 5.4 10*3/uL (ref 4.0–10.5)

## 2017-02-20 LAB — HEPARIN LEVEL (UNFRACTIONATED): HEPARIN UNFRACTIONATED: 0.75 [IU]/mL — AB (ref 0.30–0.70)

## 2017-02-20 LAB — TROPONIN I: Troponin I: <0.03 ng/mL (ref <0.03)

## 2017-02-20 MED ORDER — OMEGA-3-ACID ETHYL ESTERS 1 G PO CAPS
1.0000 g | ORAL_CAPSULE | Freq: Every day | ORAL | Status: DC
  Administered 2017-02-20 – 2017-02-21 (×2): 1 g via ORAL
  Filled 2017-02-20 (×2): qty 1

## 2017-02-20 MED ORDER — ASPIRIN 81 MG PO CHEW
324.0000 mg | CHEWABLE_TABLET | ORAL | Status: AC
  Administered 2017-02-20: 324 mg via ORAL
  Filled 2017-02-20: qty 4

## 2017-02-20 MED ORDER — ADULT MULTIVITAMIN W/MINERALS CH
1.0000 | ORAL_TABLET | Freq: Every day | ORAL | Status: DC
  Administered 2017-02-20 – 2017-02-21 (×2): 1 via ORAL
  Filled 2017-02-20 (×2): qty 1

## 2017-02-20 MED ORDER — ASPIRIN EC 81 MG PO TBEC: 81.0000 mg | DELAYED_RELEASE_TABLET | Freq: Every day | ORAL | Status: DC

## 2017-02-20 MED ORDER — ACETAMINOPHEN 325 MG PO TABS
650.0000 mg | ORAL_TABLET | ORAL | Status: DC | PRN
Start: 2017-02-20 — End: 2017-02-21
  Administered 2017-02-21: 650 mg via ORAL
  Filled 2017-02-20: qty 2

## 2017-02-20 MED ORDER — ASPIRIN 300 MG RE SUPP: 300.0000 mg | RECTAL | Status: AC

## 2017-02-20 MED ORDER — AMLODIPINE BESYLATE 10 MG PO TABS
10.0000 mg | ORAL_TABLET | Freq: Every day | ORAL | Status: DC
  Administered 2017-02-20 – 2017-02-21 (×2): 10 mg via ORAL
  Filled 2017-02-20 (×2): qty 2

## 2017-02-20 MED ORDER — NITROGLYCERIN 0.4 MG SL SUBL
0.4000 mg | SUBLINGUAL_TABLET | SUBLINGUAL | Status: DC | PRN
  Administered 2017-02-20: 0.4 mg via SUBLINGUAL

## 2017-02-20 MED ORDER — SODIUM CHLORIDE 0.9% FLUSH
3.0000 mL | Freq: Two times a day (BID) | INTRAVENOUS | Status: DC
  Administered 2017-02-21: 3 mL via INTRAVENOUS

## 2017-02-20 MED ORDER — ASPIRIN 81 MG PO CHEW
81.0000 mg | CHEWABLE_TABLET | ORAL | Status: AC
  Administered 2017-02-21: 81 mg via ORAL
  Filled 2017-02-20: qty 1

## 2017-02-20 MED ORDER — HEPARIN BOLUS VIA INFUSION
4000.0000 [IU] | Freq: Once | INTRAVENOUS | Status: AC
  Administered 2017-02-20: 4000 [IU] via INTRAVENOUS
  Filled 2017-02-20: qty 4000

## 2017-02-20 MED ORDER — NITROGLYCERIN 0.4 MG SL SUBL
0.4000 mg | SUBLINGUAL_TABLET | SUBLINGUAL | Status: DC | PRN
  Filled 2017-02-20: qty 1

## 2017-02-20 MED ORDER — HEPARIN (PORCINE) IN NACL 100-0.45 UNIT/ML-% IJ SOLN
850.0000 [IU]/h | INTRAMUSCULAR | Status: DC
  Filled 2017-02-20: qty 250

## 2017-02-20 MED ORDER — HEPARIN (PORCINE) IN NACL 100-0.45 UNIT/ML-% IJ SOLN
950.0000 [IU]/h | INTRAMUSCULAR | Status: DC
  Administered 2017-02-20: 950 [IU]/h via INTRAVENOUS
  Filled 2017-02-20: qty 250

## 2017-02-20 MED ORDER — GLUCOSAMINE 500 MG PO CAPS: 1.0000 | ORAL_CAPSULE | Freq: Every day | ORAL | Status: DC

## 2017-02-20 MED ORDER — MORPHINE SULFATE (PF) 4 MG/ML IV SOLN: 1.0000 mg | INTRAVENOUS | Status: DC | PRN

## 2017-02-20 MED ORDER — SODIUM CHLORIDE 0.9% FLUSH: 3.0000 mL | INTRAVENOUS | Status: DC | PRN

## 2017-02-20 MED ORDER — B COMPLEX-C PO TABS
1.0000 | ORAL_TABLET | ORAL | Status: DC
  Administered 2017-02-20: 1 via ORAL
  Filled 2017-02-20: qty 1

## 2017-02-20 MED ORDER — CALCIUM CARBONATE-VITAMIN D 500-200 MG-UNIT PO TABS
1.0000 | ORAL_TABLET | Freq: Every day | ORAL | Status: DC
  Administered 2017-02-20 – 2017-02-21 (×2): 1 via ORAL
  Filled 2017-02-20 (×2): qty 1

## 2017-02-20 MED ORDER — SODIUM CHLORIDE 0.9 % IV SOLN
INTRAVENOUS | Status: DC
  Administered 2017-02-21: 06:00:00 via INTRAVENOUS

## 2017-02-20 MED ORDER — NITROGLYCERIN 0.3 MG SL SUBL
0.3000 mg | SUBLINGUAL_TABLET | SUBLINGUAL | Status: DC | PRN
  Filled 2017-02-20: qty 100

## 2017-02-20 MED ORDER — VITAMIN C 500 MG PO TABS
500.0000 mg | ORAL_TABLET | Freq: Every day | ORAL | Status: DC
  Administered 2017-02-20 – 2017-02-21 (×2): 500 mg via ORAL
  Filled 2017-02-20 (×2): qty 1

## 2017-02-20 MED ORDER — ONDANSETRON HCL 4 MG/2ML IJ SOLN: 4.0000 mg | Freq: Four times a day (QID) | INTRAMUSCULAR | Status: DC | PRN

## 2017-02-20 MED ORDER — VITAMIN D3 25 MCG (1000 UNIT) PO TABS
1000.0000 [IU] | ORAL_TABLET | Freq: Every day | ORAL | Status: DC
  Administered 2017-02-20 – 2017-02-21 (×2): 1000 [IU] via ORAL
  Filled 2017-02-20 (×4): qty 1

## 2017-02-20 MED ORDER — SIMVASTATIN 20 MG PO TABS
20.0000 mg | ORAL_TABLET | Freq: Every day | ORAL | Status: DC
  Administered 2017-02-20: 20 mg via ORAL
  Filled 2017-02-20: qty 1

## 2017-02-20 MED ORDER — SODIUM CHLORIDE 0.9 % IV SOLN: 250.0000 mL | INTRAVENOUS | Status: DC | PRN

## 2017-02-20 MED ORDER — NITROGLYCERIN 0.4 MG SL SUBL
SUBLINGUAL_TABLET | SUBLINGUAL | Status: AC
  Filled 2017-02-20: qty 1

## 2017-02-20 MED ORDER — CLOPIDOGREL BISULFATE 75 MG PO TABS
75.0000 mg | ORAL_TABLET | Freq: Every day | ORAL | Status: DC
  Administered 2017-02-21: 75 mg via ORAL
  Filled 2017-02-20: qty 1

## 2017-02-20 NOTE — ED Notes (Signed)
Patient transported to X-ray 

## 2017-02-20 NOTE — Progress Notes (Signed)
Paged PA Barrett about patient's HR. Noticed he's in 2nd degree HB, type 1. Asymptomatic, resting at the moment. Other VSS. Strip saved. No further orders. Will continue to monitor.

## 2017-02-20 NOTE — ED Notes (Signed)
EKG re-done. Previous EKG from triage not crossing over.

## 2017-02-20 NOTE — Progress Notes (Signed)
ANTICOAGULATION CONSULT NOTE - Initial Consult  Pharmacy Consult for Heparin Indication: chest pain/ACS  No Known Allergies  Patient Measurements: Height: 5\' 6"  (167.6 cm) Weight: 170 lb (77.1 kg) IBW/kg (Calculated) : 63.8 Heparin Dosing Weight: 77 kg  Assessment: 83 yo M presents with chest pain. Hx of CAD. Pharmacy consulted to start heparin. No anticoag PTA. Hgb 11.9, plts wnl. No s/s of bleed.  Goal of Therapy:  Heparin level 0.3-0.7 units/ml Monitor platelets by anticoagulation protocol: Yes   Plan:  Give 4,000 unit heparin bolus Start heparin gtt at 950 units/hr Check 8 hr heparin level Monitor daily heparin level, CBC, s/s of bleed  Elenor Quinones, PharmD, BCPS Clinical Pharmacist Pager [phone] 02/20/2017 11:40 AM

## 2017-02-20 NOTE — ED Notes (Signed)
Pt states chest pain has just restarted while attempting to void to urinal. Dr. Harrington Challenger contacted for change in bed placement. aftert current assignment states they cannot take active chest pain.

## 2017-02-20 NOTE — Progress Notes (Signed)
ANTICOAGULATION CONSULT NOTE - FOLLOW UP    HL = 0.75 (goal 0.3 - 0.7 units/mL) Heparin dosing weight = 77 kg   Assessment: 89 YOM on IV heparin for chest pain.  Heparin level drawn an hour early and is slightly above goal.  Confirmed with RN that lab was drawn appropriately.  No bleeding reported.     Plan: - Reduce heparin gtt to 850 units/hr - Check 8 hr heparin level (AM labs)    Tyvon Eggenberger D. Mina Marble, PharmD, BCPS 02/20/2017, 8:38 PM

## 2017-02-20 NOTE — ED Notes (Signed)
Attempted to call report

## 2017-02-20 NOTE — ED Triage Notes (Signed)
Pt reports aching, generalized cp that woke him up at 3am this morning. Pt reports some sob and diaphoresis. Pt has a hx of a stent placed last year d/t results of a stress test. Pt denies any pain at the moment.

## 2017-02-20 NOTE — H&P (Addendum)
Primary Physician: Primary Cardiologist:  [name]   HPI: Pt is an 83 yo with history of CAD , CV dz (s/p L CEA,), HTN, HL, Hx CVA  He had an abnormal stress test in 5/17 with nonsustained ventricular tachycardia and ST segment depression. He underwent LHC which demonstrated 3 V CAD with an occluded LCx with R-L collaterals, occluded apical LAD, borderline disease in the RI, severe stenosis in the RCA.  He underwent PCI with a DES to the RCA.  He had some proximal LAD disease that did not appear to be significant. It was not a good target for CABG. LCx was also not a good target and there were left-right collaterals. Therefore, residual disease treated medically. Last seen by Dr. Johnsie Ball in 8/17. He did follow-up with Dr. Fletcher Anon for right radial artery AV fistula that occurred after his heart cath. This was asymptomatic and did not require further intervention.    Pt was seen by S WEaver in Feb 2018   Woke up this AM at 3 AM with CP  Mild  SOB   Has old NTG at home  Never tood  Took ASA x 1  Waited 15 min  Took another   Called EMS  EMS had hiim chew 3rd  Discomfort eased in 20 min.  Pt says prior to stent he did nit have chest discomfort  Cath done for abnormal GXT/myovue as noted Pt admits to increased DOE over past few wks  When walks the dog he is winded  Winded with stairs  This is new   Did have one other episode of chest tightness a few weeks ago  Eased on own     Past Medical History:  Diagnosis Date  . AV BLOCK, 1ST DEGREE   . CAD, NATIVE VESSEL    a. LHC 12/2015  3vd Left Cx 100% with colaterals, and distally LAD occluded, DES to prox-mid RCA  . CAROTID ARTERY STENOSIS, WITHOUT INFARCTION    s/p Left ECA followed by Dr. Donnetta Hutching  . GERD (gastroesophageal reflux disease)   . HYPERLIPIDEMIA-MIXED   . HYPERTENSION, UNSPECIFIED   . NSVT (nonsustained ventricular tachycardia) ([place])    during stress test 12/2015  . Old myocardial infarction    02/1992  . PREMATURE VENTRICULAR  CONTRACTIONS   . Stroke ([place])   . TIA (transient ischemic attack)      (Not in a hospital admission)     Infusions:   No Known Allergies  Social History   Social History  . Marital status: Married    Spouse name: N/A  . Number of children: N/A  . Years of education: N/A   Occupational History  . Not on file.   Social History Main Topics  . Smoking status: Never Smoker  . Smokeless tobacco: Never Used  . Alcohol use 9.0 oz/week    15 Glasses of wine per week  . Drug use: No  . Sexual activity: Not on file   Other Topics Concern  . Not on file   Social History Narrative  . No narrative on file    Family History  Problem Relation Age of Onset  . Diabetes Mother   . Diabetes Sister   . Cancer Sister 23       colon  . Heart disease Sister     REVIEW OF SYSTEMS:  All systems reviewed  Negative to the above problem except as noted above.    PHYSICAL EXAM: Vitals:   02/20/17 0645 02/20/17 0730  BP: 127/66 (!) 120/59  Pulse: (!) 56 (!) 59  Resp: 16 20  Temp:      No intake or output data in the 24 hours ending 02/20/17 0750  General:  Well appearing. No respiratory difficulty  Sl sweaty   HEENT: normal Neck: supple. no JVD. Carotids 2+ bilat; no bruits. No lymphadenopathy or thryomegaly appreciated. Cor: PMI nondisplaced. Regular rate & rhythm. No rubs, gallops or murmurs. Lungs: clear Abdomen: soft, nontender, nondistended. No hepatosplenomegaly. No bruits or masses. Good bowel sounds. Extremities: no cyanosis, clubbing, rash, edema  R wrist with thrill over r radial artery   Neuro: alert & oriented x 3, cranial nerves grossly intact. moves all 4 extremities w/o difficulty. Affect pleasant.  ECG:  SB 54 bpm  First degree AVB  PR .396 msec  Results for orders placed or performed during the hospital encounter of 02/20/17 (from the past 24 hour(s))  Basic metabolic panel     Status: Abnormal   Collection Time: 02/20/17  5:25 AM  Result Value Ref  Range   Sodium 142 135 - 145 mmol/L   Potassium 3.8 3.5 - 5.1 mmol/L   Chloride 111 101 - 111 mmol/L   CO2 22 22 - 32 mmol/L   Glucose, Bld 115 (H) 65 - 99 mg/dL   BUN 36 (H) 6 - 20 mg/dL   Creatinine, Ser 1.23 0.61 - 1.24 mg/dL   Calcium 9.3 8.9 - 10.3 mg/dL   GFR calc non Af Amer 51 (L) >60 mL/min   GFR calc Af Amer 59 (L) >60 mL/min   Anion gap 9 5 - 15  CBC     Status: Abnormal   Collection Time: 02/20/17  5:25 AM  Result Value Ref Range   WBC 5.4 4.0 - 10.5 K/uL   RBC 3.84 (L) 4.22 - 5.81 MIL/uL   Hemoglobin 11.9 (L) 13.0 - 17.0 g/dL   HCT 36.8 (L) 39.0 - 52.0 %   MCV 95.8 78.0 - 100.0 fL   MCH 31.0 26.0 - 34.0 pg   MCHC 32.3 30.0 - 36.0 g/dL   RDW 13.4 11.5 - 15.5 %   Platelets 177 150 - 400 K/uL  I-stat troponin, ED     Status: None   Collection Time: 02/20/17  5:25 AM  Result Value Ref Range   Troponin i, poc 0.01 0.00 - 0.08 ng/mL   Comment 3           Dg Chest 2 View  Result Date: 02/20/2017 CLINICAL DATA:  Chest pain EXAM: CHEST  2 VIEW COMPARISON:  Chest radiograph 11/19/2006 FINDINGS: Unchanged cardiomegaly with atherosclerotic calcification of the aortic arch. Unchanged calcified granuloma in the left upper lobe. Bibasilar atelectasis without other focal consolidation. No pulmonary edema. No pneumothorax or pleural effusion. IMPRESSION: Cardiomegaly and bibasilar atelectasis without acute abnormality. Electronically Signed   By: Ulyses Jarred M.D.   On: 02/20/2017 06:21     ASSESSMENT: 83 yo with hsitory of CAD  Stent in 2017  Admitted with chest dyscomfort  Admits to increased dyspnea on exertion   Exam rel unremarkable  EKG is rel unchanged  I am not convinced this represents symptomatic bradycardia Labs so far are neg for injury  I have reviewed with family and pt   WIth current symptoms and known anatomy I would recomm L heart cath to define anatomy    Pt understands  AGrees to proceed    Admitt  Will Rx with Heparin for now.  Hydrate in AM Follow  renal  function     2  HTN  Continue home meds  Follow BP  Will hold ACE I with Cr 1.2  WIll need to follow    3  HL  Continue statin  4  Hx CV dz

## 2017-02-20 NOTE — ED Provider Notes (Signed)
[facility] DEPT Provider Note   CSN: [id number] Arrival date & time: 02/20/17  0431     History   Chief Complaint Chief Complaint  Patient presents with  . Chest Pain    HPI [name] is a 83 y.o. male.  Patient is a 61-year-old male with past medical history of coronary artery disease presenting for evaluation of chest discomfort. This began at approximately 3 AM and woke him from sleep. He denies any shortness of breath, nausea, or diaphoresis. He denies any fevers, chills, or cough. He states that this discomfort was different from his prior cardiac pain. He was given aspirin by EMS and his symptoms resolved. He is now pain free.   The history is provided by the patient.  Chest Pain   This is a new problem. Episode onset: 3 AM. The problem occurs constantly. The problem has been resolved. The pain is present in the substernal region. The pain is moderate. The quality of the pain is described as pressure-like. The pain does not radiate. Pertinent negatives include no fever, no irregular heartbeat, no nausea, no shortness of breath and no sputum production. He has tried nothing for the symptoms. The treatment provided no relief.    Past Medical History:  Diagnosis Date  . AV BLOCK, 1ST DEGREE   . CAD, NATIVE VESSEL    a. LHC 12/2015  3vd Left Cx 100% with colaterals, and distally LAD occluded, DES to prox-mid RCA  . CAROTID ARTERY STENOSIS, WITHOUT INFARCTION    s/p Left ECA followed by Dr. Donnetta Hutching  . GERD (gastroesophageal reflux disease)   . HYPERLIPIDEMIA-MIXED   . HYPERTENSION, UNSPECIFIED   . NSVT (nonsustained ventricular tachycardia) ([place])    during stress test 12/2015  . Old myocardial infarction    02/1992  . PREMATURE VENTRICULAR CONTRACTIONS   . Stroke ([place])   . TIA (transient ischemic attack)     Patient Active Problem List   Diagnosis Date Noted  . Abnormal stress test 01/11/2016  . NSVT (nonsustained ventricular tachycardia) ([place])   . Atherosclerosis of  native coronary artery of native heart   . Sinus bradycardia by electrocardiogram 05/08/2012  . Elevated lipids 05/17/2010  . OLD MYOCARDIAL INFARCTION 04/29/2009  . CAD, NATIVE VESSEL 04/29/2009  . Bilateral carotid bruits 04/29/2009  . Essential hypertension 01/06/2009  . AV BLOCK, 1ST DEGREE 01/06/2009  . PREMATURE VENTRICULAR CONTRACTIONS 01/06/2009    Past Surgical History:  Procedure Laterality Date  . CARDIAC CATHETERIZATION     x2  . CARDIAC CATHETERIZATION N/A 01/11/2016   Procedure: Left Heart Cath and Coronary Angiography;  Surgeon: Wellington Hampshire, MD;  Location: [facility];  Service: Cardiovascular;  Laterality: N/A;  . CAROTID ENDARTERECTOMY  11/28/2006   left  . HERNIA REPAIR    . TONSILLECTOMY         Home Medications    Prior to Admission medications   Medication Sig Start Date End Date Taking? Authorizing Provider  amLODipine (NORVASC) 10 MG tablet Take 1 tablet (10 mg total) by mouth daily. 10/26/16   Josue Hector, MD  aspirin EC 81 MG EC tablet Take 1 tablet (81 mg total) by mouth daily. 01/12/16   Reino Bellis B, NP  b complex vitamins tablet Take 1 tablet by mouth every other day.     [provider]  Calcium Carbonate (CALCIUM 500 PO) Take 1 tablet by mouth daily.    [provider]  cephALEXin (KEFLEX) 500 MG capsule  09/12/16  [provider]  Cholecalciferol (VITAMIN D-3) 1000 units CAPS Take 1 capsule by mouth daily.    [provider]  clopidogrel (PLAVIX) 75 MG tablet Take 1 tablet (75 mg total) by mouth daily with breakfast. Please call and schedule follow up for further refills [phone] 12/27/16   Josue Hector, MD  Cyanocobalamin (VITAMIN B-12 PO) Take 1 tablet by mouth every other day.    [provider]  Glucosamine 500 MG CAPS Take 1 capsule by mouth daily.     [provider]  hydrochlorothiazide (MICROZIDE) 12.5 MG capsule Take 1 capsule (12.5 mg total) by mouth daily.  10/26/16   Josue Hector, MD  lisinopril (PRINIVIL,ZESTRIL) 40 MG tablet Take 1 tablet (40 mg total) by mouth daily. 10/26/16   Josue Hector, MD  Multiple Vitamin (MULTIVITAMIN) capsule Take 1 capsule by mouth daily.    [provider]  nitroGLYCERIN (NITROSTAT) 0.4 MG SL tablet Place 1 tablet (0.4 mg total) under the tongue every 5 (five) minutes x 3 doses as needed for chest pain. 01/12/16   Cheryln Manly, NP  Omega-3 Fatty Acids (FISH OIL) 1000 MG CAPS Take 1 capsule by mouth daily.    [provider]  simvastatin (ZOCOR) 20 MG tablet Take 1 tablet (20 mg total) by mouth daily. 10/26/16   Josue Hector, MD  traMADol Veatrice Bourbon) 50 MG tablet take 1 tablet by mouth every 6 hours if needed for pain 10/23/16   [provider]  vitamin C (ASCORBIC ACID) 500 MG tablet Take 500 mg by mouth daily.      [provider]  vitamin E 100 UNIT capsule Take 100 Units by mouth daily.    [provider]    Family History Family History  Problem Relation Age of Onset  . Diabetes Mother   . Diabetes Sister   . Cancer Sister 102       colon  . Heart disease Sister     Social History Social History  Substance Use Topics  . Smoking status: Never Smoker  . Smokeless tobacco: Never Used  . Alcohol use 9.0 oz/week    15 Glasses of wine per week     Allergies   Patient has no known allergies.   Review of Systems Review of Systems  Constitutional: Negative for fever.  Respiratory: Negative for sputum production and shortness of breath.   Cardiovascular: Positive for chest pain.  Gastrointestinal: Negative for nausea.  All other systems reviewed and are negative.    Physical Exam Updated Vital Signs BP 120/65   Pulse (!) 56   Temp 98.3 F (36.8 C) (Oral)   Resp 18   Ht 5\' 6"  (1.676 m)   Wt 77.1 kg (170 lb)   SpO2 95%   BMI 27.44 kg/m   Physical Exam  Constitutional: He is oriented to person, place, and time. He appears well-developed and  well-nourished. No distress.  HENT:  Head: Normocephalic and atraumatic.  Mouth/Throat: Oropharynx is clear and moist.  Neck: Normal range of motion. Neck supple.  Cardiovascular: Normal rate and regular rhythm.  Exam reveals no friction rub.   No murmur heard. Pulmonary/Chest: Effort normal and breath sounds normal. No respiratory distress. He has no wheezes. He has no rales.  Abdominal: Soft. Bowel sounds are normal. He exhibits no distension. There is no tenderness.  Musculoskeletal: Normal range of motion. He exhibits no edema.  Neurological: He is alert and oriented to person, place, and time. Coordination normal.  Skin: Skin is warm and dry. He is not diaphoretic.  Nursing note and vitals reviewed.    ED Treatments / Results  Labs (all labs ordered are listed, but only abnormal results are displayed) Labs Reviewed  CBC - Abnormal; Notable for the following:       Result Value   RBC 3.84 (*)    Hemoglobin 11.9 (*)    HCT 36.8 (*)    All other components within normal limits  BASIC METABOLIC PANEL  I-STAT TROPOININ, ED    EKG  EKG Interpretation  Date/Time:  Wednesday February 20 2017 05:13:01 EDT Ventricular Rate:  54 PR Interval:    QRS Duration: 104 QT Interval:  439 QTC Calculation: 416 R Axis:   -6 Text Interpretation:  Sinus or ectopic atrial rhythm Prolonged PR interval Low voltage, precordial leads Abnormal R-wave progression, early transition Confirmed by Veryl Speak [phone]) on 02/20/2017 5:33:13 AM       Radiology No results found.  Procedures Procedures (including critical care time)  Medications Ordered in ED Medications - No data to display   Initial Impression / Assessment and Plan / ED Course  I have reviewed the triage vital signs and the nursing notes.  Pertinent labs & imaging results that were available during my care of the patient were reviewed by me and considered in my medical decision making (see chart for details).  Patient with  history of coronary artery disease, most recently stented in May 2017. He presents with chest discomfort that feels somewhat different than what he experienced in the past with his prior heart issues. This lasted for approximately one hour and resolved after taking aspirin. He is now pain free. His workup is thus far unremarkable including an unchanged EKG and negative first troponin. He will undergo a second troponin and cardiology will consult to determine the final disposition.  Final Clinical Impressions(s) / ED Diagnoses   Final diagnoses:  None    New Prescriptions New Prescriptions   No medications on file     Veryl Speak, MD 02/20/17 [phone]

## 2017-02-20 NOTE — Progress Notes (Signed)
PHARMACIST - PHYSICIAN ORDER COMMUNICATION  CONCERNING: P&T Medication Policy on Herbal Medications  DESCRIPTION:  This patient's order for:  Glucosamine  has been noted.  This product(s) is classified as an "herbal" or natural product. Due to a lack of definitive safety studies or FDA approval, nonstandard manufacturing practices, plus the potential risk of unknown drug-drug interactions while on inpatient medications, the Pharmacy and Therapeutics Committee does not permit the use of "herbal" or natural products of this type within [facility].   ACTION TAKEN: The pharmacy department is unable to verify this order at this time and your patient has been informed of this safety policy. Please reevaluate patient's clinical condition at discharge and address if the herbal or natural product(s) should be resumed at that time.    Lewie Chamber., PharmD Clinical Pharmacist [facility]

## 2017-02-21 ENCOUNTER — Inpatient Hospital Stay (HOSPITAL_COMMUNITY)
Admission: EM | Admit: 2017-02-21 | Discharge: 2017-02-28 | DRG: 247 | Disposition: A | Discharge status: Home / Self Care | Payer: Medicare Other | Attending: Internal Medicine | Admitting: Internal Medicine

## 2017-02-21 ENCOUNTER — Emergency Department (HOSPITAL_COMMUNITY): Payer: Medicare Other

## 2017-02-21 ENCOUNTER — Encounter: Payer: Self-pay | Admitting: Family Medicine

## 2017-02-21 ENCOUNTER — Ambulatory Visit (INDEPENDENT_AMBULATORY_CARE_PROVIDER_SITE_OTHER): Payer: Medicare Other | Admitting: Family Medicine

## 2017-02-21 ENCOUNTER — Encounter (HOSPITAL_COMMUNITY): Payer: Self-pay | Admitting: Emergency Medicine

## 2017-02-21 ENCOUNTER — Encounter (HOSPITAL_COMMUNITY): Payer: Self-pay | Admitting: Cardiovascular Disease

## 2017-02-21 ENCOUNTER — Encounter (HOSPITAL_COMMUNITY)
Admission: EM | Disposition: A | Discharge status: Home / Self Care | Payer: Self-pay | Source: Home / Self Care | Attending: Emergency Medicine

## 2017-02-21 VITALS — BP 157/66 | HR 74 | Temp 97.7°F | Resp 16 | Ht 66.0 in | Wt 170.0 lb

## 2017-02-21 DIAGNOSIS — M109 Gout, unspecified: Secondary | ICD-10-CM | POA: Diagnosis present

## 2017-02-21 DIAGNOSIS — I25110 Atherosclerotic heart disease of native coronary artery with unstable angina pectoris: Secondary | ICD-10-CM

## 2017-02-21 DIAGNOSIS — K219 Gastro-esophageal reflux disease without esophagitis: Secondary | ICD-10-CM | POA: Diagnosis not present

## 2017-02-21 DIAGNOSIS — Z7902 Long term (current) use of antithrombotics/antiplatelets: Secondary | ICD-10-CM | POA: Diagnosis not present

## 2017-02-21 DIAGNOSIS — E7800 Pure hypercholesterolemia, unspecified: Secondary | ICD-10-CM | POA: Diagnosis not present

## 2017-02-21 DIAGNOSIS — R7989 Other specified abnormal findings of blood chemistry: Secondary | ICD-10-CM | POA: Diagnosis not present

## 2017-02-21 DIAGNOSIS — R001 Bradycardia, unspecified: Secondary | ICD-10-CM | POA: Diagnosis not present

## 2017-02-21 DIAGNOSIS — I10 Essential (primary) hypertension: Secondary | ICD-10-CM

## 2017-02-21 DIAGNOSIS — Z8673 Personal history of transient ischemic attack (TIA), and cerebral infarction without residual deficits: Secondary | ICD-10-CM | POA: Diagnosis not present

## 2017-02-21 DIAGNOSIS — I779 Disorder of arteries and arterioles, unspecified: Secondary | ICD-10-CM | POA: Diagnosis present

## 2017-02-21 DIAGNOSIS — R109 Unspecified abdominal pain: Secondary | ICD-10-CM

## 2017-02-21 DIAGNOSIS — I200 Unstable angina: Secondary | ICD-10-CM

## 2017-02-21 DIAGNOSIS — I959 Hypotension, unspecified: Secondary | ICD-10-CM | POA: Diagnosis not present

## 2017-02-21 DIAGNOSIS — Z955 Presence of coronary angioplasty implant and graft: Secondary | ICD-10-CM

## 2017-02-21 DIAGNOSIS — I209 Angina pectoris, unspecified: Secondary | ICD-10-CM

## 2017-02-21 DIAGNOSIS — I440 Atrioventricular block, first degree: Secondary | ICD-10-CM | POA: Diagnosis not present

## 2017-02-21 DIAGNOSIS — N179 Acute kidney failure, unspecified: Secondary | ICD-10-CM | POA: Diagnosis not present

## 2017-02-21 DIAGNOSIS — I25709 Atherosclerosis of coronary artery bypass graft(s), unspecified, with unspecified angina pectoris: Secondary | ICD-10-CM

## 2017-02-21 DIAGNOSIS — E782 Mixed hyperlipidemia: Secondary | ICD-10-CM | POA: Diagnosis not present

## 2017-02-21 DIAGNOSIS — E785 Hyperlipidemia, unspecified: Secondary | ICD-10-CM | POA: Diagnosis not present

## 2017-02-21 DIAGNOSIS — Z800 Family history of malignant neoplasm of digestive organs: Secondary | ICD-10-CM

## 2017-02-21 DIAGNOSIS — I6523 Occlusion and stenosis of bilateral carotid arteries: Secondary | ICD-10-CM

## 2017-02-21 DIAGNOSIS — I739 Peripheral vascular disease, unspecified: Secondary | ICD-10-CM

## 2017-02-21 DIAGNOSIS — Z7982 Long term (current) use of aspirin: Secondary | ICD-10-CM

## 2017-02-21 DIAGNOSIS — I252 Old myocardial infarction: Secondary | ICD-10-CM | POA: Diagnosis not present

## 2017-02-21 DIAGNOSIS — Z8249 Family history of ischemic heart disease and other diseases of the circulatory system: Secondary | ICD-10-CM | POA: Diagnosis not present

## 2017-02-21 DIAGNOSIS — M79645 Pain in left finger(s): Secondary | ICD-10-CM | POA: Diagnosis not present

## 2017-02-21 DIAGNOSIS — I441 Atrioventricular block, second degree: Secondary | ICD-10-CM | POA: Diagnosis not present

## 2017-02-21 DIAGNOSIS — Z833 Family history of diabetes mellitus: Secondary | ICD-10-CM

## 2017-02-21 DIAGNOSIS — I25119 Atherosclerotic heart disease of native coronary artery with unspecified angina pectoris: Secondary | ICD-10-CM | POA: Diagnosis present

## 2017-02-21 DIAGNOSIS — R079 Chest pain, unspecified: Secondary | ICD-10-CM | POA: Diagnosis present

## 2017-02-21 HISTORY — DX: Atherosclerosis of coronary artery bypass graft(s), unspecified, with unspecified angina pectoris: I25.709

## 2017-02-21 HISTORY — DX: Presence of coronary angioplasty implant and graft: Z95.5

## 2017-02-21 HISTORY — PX: LEFT HEART CATH AND CORONARY ANGIOGRAPHY: CATH118249

## 2017-02-21 LAB — BASIC METABOLIC PANEL
ANION GAP: 11 (ref 5–15)
ANION GAP: 10 (ref 5–15)
BUN: 35 mg/dL — ABNORMAL HIGH (ref 6–20)
BUN: 31 mg/dL — ABNORMAL HIGH (ref 6–20)
CALCIUM: 8.5 mg/dL — AB (ref 8.9–10.3)
CHLORIDE: 109 mmol/L (ref 101–111)
CO2: 24 mmol/L (ref 22–32)
CO2: 18 mmol/L — AB (ref 22–32)
CREATININE: 1.22 mg/dL (ref 0.61–1.24)
CREATININE: 1.34 mg/dL — AB (ref 0.61–1.24)
Calcium: 8.8 mg/dL — ABNORMAL LOW (ref 8.9–10.3)
Chloride: 107 mmol/L (ref 101–111)
GFR calc Af Amer: 53 mL/min — ABNORMAL LOW (ref >60)
GFR calc non Af Amer: 51 mL/min — ABNORMAL LOW (ref >60)
GFR calc non Af Amer: 46 mL/min — ABNORMAL LOW (ref >60)
GFR, EST AFRICAN AMERICAN: 60 mL/min — AB (ref >60)
Glucose, Bld: 104 mg/dL — ABNORMAL HIGH (ref 65–99)
Glucose, Bld: 129 mg/dL — ABNORMAL HIGH (ref 65–99)
Potassium: 3.9 mmol/L (ref 3.5–5.1)
Potassium: 3.5 mmol/L (ref 3.5–5.1)
Sodium: 144 mmol/L (ref 135–145)
Sodium: 135 mmol/L (ref 135–145)

## 2017-02-21 LAB — PROTIME-INR
INR: 1.12
Prothrombin Time: 14.5 seconds (ref 11.4–15.2)

## 2017-02-21 LAB — CBC
HCT: 35.4 % — ABNORMAL LOW (ref 39.0–52.0)
HEMATOCRIT: 34.2 % — AB (ref 39.0–52.0)
HEMOGLOBIN: 11.7 g/dL — AB (ref 13.0–17.0)
Hemoglobin: 11.1 g/dL — ABNORMAL LOW (ref 13.0–17.0)
MCH: 31 pg (ref 26.0–34.0)
MCH: 31.3 pg (ref 26.0–34.0)
MCHC: 32.5 g/dL (ref 30.0–36.0)
MCHC: 33.1 g/dL (ref 30.0–36.0)
MCV: 95.5 fL (ref 78.0–100.0)
MCV: 94.7 fL (ref 78.0–100.0)
Platelets: 162 10*3/uL (ref 150–400)
Platelets: 167 10*3/uL (ref 150–400)
RBC: 3.58 MIL/uL — AB (ref 4.22–5.81)
RBC: 3.74 MIL/uL — ABNORMAL LOW (ref 4.22–5.81)
RDW: 13.3 % (ref 11.5–15.5)
RDW: 13.3 % (ref 11.5–15.5)
WBC: 5.6 10*3/uL (ref 4.0–10.5)
WBC: 8.1 10*3/uL (ref 4.0–10.5)

## 2017-02-21 LAB — LIPID PANEL
CHOL/HDL RATIO: 2.4 ratio
Cholesterol: 109 mg/dL (ref 0–200)
HDL: 46 mg/dL (ref >40)
LDL CALC: 53 mg/dL (ref 0–99)
TRIGLYCERIDES: 52 mg/dL (ref <150)
VLDL: 10 mg/dL (ref 0–40)

## 2017-02-21 LAB — I-STAT TROPONIN, ED: TROPONIN I, POC: 0.04 ng/mL (ref 0.00–0.08)

## 2017-02-21 LAB — HEPARIN LEVEL (UNFRACTIONATED)
Heparin Unfractionated: 0.52 IU/mL (ref 0.30–0.70)
Heparin Unfractionated: 0.52 IU/mL (ref 0.30–0.70)

## 2017-02-21 LAB — TROPONIN I: Troponin I: <0.03 ng/mL (ref <0.03)

## 2017-02-21 SURGERY — LEFT HEART CATH AND CORONARY ANGIOGRAPHY
Anesthesia: LOCAL

## 2017-02-21 MED ORDER — ONDANSETRON HCL 4 MG/2ML IJ SOLN: 4.0000 mg | Freq: Four times a day (QID) | INTRAMUSCULAR | Status: DC | PRN

## 2017-02-21 MED ORDER — MORPHINE SULFATE (PF) 2 MG/ML IV SOLN: 2.0000 mg | INTRAVENOUS | Status: DC | PRN

## 2017-02-21 MED ORDER — IOPAMIDOL (ISOVUE-370) INJECTION 76%
INTRAVENOUS | Status: DC | PRN
  Administered 2017-02-21: 60 mL via INTRA_ARTERIAL

## 2017-02-21 MED ORDER — HEPARIN (PORCINE) IN NACL 2-0.9 UNIT/ML-% IJ SOLN
INTRAMUSCULAR | Status: AC | PRN
  Administered 2017-02-21: 1000 mL

## 2017-02-21 MED ORDER — AMLODIPINE BESYLATE 10 MG PO TABS
10.0000 mg | ORAL_TABLET | Freq: Every day | ORAL | Status: DC
  Administered 2017-02-22 – 2017-02-24 (×3): 10 mg via ORAL
  Filled 2017-02-21 (×2): qty 2
  Filled 2017-02-21: qty 1

## 2017-02-21 MED ORDER — COLCHICINE 0.6 MG PO CAPS: ORAL_CAPSULE | ORAL | 0 refills | Status: DC

## 2017-02-21 MED ORDER — IOPAMIDOL (ISOVUE-370) INJECTION 76%
INTRAVENOUS | Status: AC
  Filled 2017-02-21: qty 100

## 2017-02-21 MED ORDER — SODIUM CHLORIDE 0.9 % IV SOLN
250.0000 mL | INTRAVENOUS | Status: DC | PRN
Start: 2017-02-21 — End: 2017-02-21

## 2017-02-21 MED ORDER — LISINOPRIL 40 MG PO TABS
40.0000 mg | ORAL_TABLET | Freq: Every day | ORAL | Status: DC
  Administered 2017-02-22 – 2017-02-28 (×7): 40 mg via ORAL
  Filled 2017-02-21: qty 1
  Filled 2017-02-21 (×2): qty 2
  Filled 2017-02-21 (×4): qty 1

## 2017-02-21 MED ORDER — ACETAMINOPHEN 500 MG PO TABS
1000.0000 mg | ORAL_TABLET | Freq: Four times a day (QID) | ORAL | Status: DC | PRN
  Administered 2017-02-22 – 2017-02-23 (×3): 1000 mg via ORAL
  Filled 2017-02-21 (×3): qty 2

## 2017-02-21 MED ORDER — SODIUM CHLORIDE 0.9 % IV SOLN
INTRAVENOUS | Status: DC
  Administered 2017-02-21: 11:00:00 via INTRAVENOUS

## 2017-02-21 MED ORDER — MIDAZOLAM HCL 2 MG/2ML IJ SOLN
INTRAMUSCULAR | Status: DC | PRN
  Administered 2017-02-21: 1 mg via INTRAVENOUS

## 2017-02-21 MED ORDER — CLOPIDOGREL BISULFATE 75 MG PO TABS
75.0000 mg | ORAL_TABLET | Freq: Every day | ORAL | Status: DC
  Administered 2017-02-22 – 2017-02-27 (×6): 75 mg via ORAL
  Filled 2017-02-21 (×7): qty 1

## 2017-02-21 MED ORDER — SODIUM CHLORIDE 0.9% FLUSH
3.0000 mL | Freq: Two times a day (BID) | INTRAVENOUS | Status: DC
  Administered 2017-02-21: 3 mL via INTRAVENOUS

## 2017-02-21 MED ORDER — VITAMIN D 1000 UNITS PO TABS
1000.0000 [IU] | ORAL_TABLET | Freq: Every day | ORAL | Status: DC
  Administered 2017-02-22 – 2017-02-28 (×7): 1000 [IU] via ORAL
  Filled 2017-02-21 (×7): qty 1

## 2017-02-21 MED ORDER — HEPARIN (PORCINE) IN NACL 100-0.45 UNIT/ML-% IJ SOLN
850.0000 [IU]/h | INTRAMUSCULAR | Status: DC
  Administered 2017-02-21 – 2017-02-23 (×2): 850 [IU]/h via INTRAVENOUS
  Filled 2017-02-21 (×2): qty 250

## 2017-02-21 MED ORDER — ASPIRIN 81 MG PO CHEW: 81.0000 mg | CHEWABLE_TABLET | Freq: Every day | ORAL | Status: DC

## 2017-02-21 MED ORDER — LIDOCAINE HCL (PF) 1 % IJ SOLN
INTRAMUSCULAR | Status: AC
  Filled 2017-02-21: qty 30

## 2017-02-21 MED ORDER — HEPARIN (PORCINE) IN NACL 2-0.9 UNIT/ML-% IJ SOLN
INTRAMUSCULAR | Status: AC
  Filled 2017-02-21: qty 1000

## 2017-02-21 MED ORDER — SIMVASTATIN 20 MG PO TABS
20.0000 mg | ORAL_TABLET | Freq: Every day | ORAL | Status: DC
  Administered 2017-02-22: 20 mg via ORAL
  Filled 2017-02-21 (×3): qty 1

## 2017-02-21 MED ORDER — CLOPIDOGREL BISULFATE 75 MG PO TABS: 75.0000 mg | ORAL_TABLET | Freq: Every day | ORAL | 2 refills | Status: DC

## 2017-02-21 MED ORDER — FENTANYL CITRATE (PF) 100 MCG/2ML IJ SOLN
INTRAMUSCULAR | Status: AC
Start: 2017-02-21 — End: 2017-02-21
  Filled 2017-02-21: qty 2

## 2017-02-21 MED ORDER — LIDOCAINE HCL (PF) 1 % IJ SOLN
INTRAMUSCULAR | Status: DC | PRN
  Administered 2017-02-21: 25 mL

## 2017-02-21 MED ORDER — FENTANYL CITRATE (PF) 100 MCG/2ML IJ SOLN
INTRAMUSCULAR | Status: DC | PRN
  Administered 2017-02-21: 25 ug via INTRAVENOUS

## 2017-02-21 MED ORDER — NITROGLYCERIN 0.4 MG SL SUBL: 0.4000 mg | SUBLINGUAL_TABLET | SUBLINGUAL | Status: DC | PRN

## 2017-02-21 MED ORDER — HYDROCHLOROTHIAZIDE 12.5 MG PO CAPS
12.5000 mg | ORAL_CAPSULE | Freq: Every day | ORAL | Status: DC
  Administered 2017-02-22 – 2017-02-26 (×5): 12.5 mg via ORAL
  Filled 2017-02-21 (×5): qty 1

## 2017-02-21 MED ORDER — NITROGLYCERIN 0.4 MG SL SUBL
0.4000 mg | SUBLINGUAL_TABLET | SUBLINGUAL | Status: DC | PRN
  Administered 2017-02-21 – 2017-02-22 (×2): 0.4 mg via SUBLINGUAL
  Filled 2017-02-21 (×2): qty 1

## 2017-02-21 MED ORDER — ACETAMINOPHEN 325 MG PO TABS: 650.0000 mg | ORAL_TABLET | ORAL | Status: DC | PRN

## 2017-02-21 MED ORDER — ASPIRIN EC 81 MG PO TBEC
81.0000 mg | DELAYED_RELEASE_TABLET | Freq: Every day | ORAL | Status: DC
  Administered 2017-02-22 – 2017-02-26 (×5): 81 mg via ORAL
  Filled 2017-02-21 (×5): qty 1

## 2017-02-21 MED ORDER — SODIUM CHLORIDE 0.9% FLUSH: 3.0000 mL | INTRAVENOUS | Status: DC | PRN

## 2017-02-21 MED ORDER — CLOPIDOGREL BISULFATE 75 MG PO TABS: 75.0000 mg | ORAL_TABLET | Freq: Every day | ORAL | Status: DC

## 2017-02-21 MED ORDER — HYDRALAZINE HCL 20 MG/ML IJ SOLN: 10.0000 mg | INTRAMUSCULAR | Status: DC | PRN

## 2017-02-21 MED ORDER — ASPIRIN 325 MG PO TABS: 325.0000 mg | ORAL_TABLET | Freq: Every day | ORAL | Status: DC

## 2017-02-21 MED ORDER — COLCHICINE 0.6 MG PO TABS
0.6000 mg | ORAL_TABLET | Freq: Every day | ORAL | Status: DC
  Administered 2017-02-22 – 2017-02-28 (×7): 0.6 mg via ORAL
  Filled 2017-02-21 (×7): qty 1

## 2017-02-21 MED ORDER — MIDAZOLAM HCL 2 MG/2ML IJ SOLN
INTRAMUSCULAR | Status: AC
  Filled 2017-02-21: qty 2

## 2017-02-21 SURGICAL SUPPLY — 10 items
CATH INFINITI 5FR MULTPACK ANG (CATHETERS) ×1 IMPLANT
DEVICE CLOSURE MYNXGRIP 5F (Vascular Products) ×1 IMPLANT
KIT HEART LEFT (KITS) ×2 IMPLANT
PACK CARDIAC CATHETERIZATION (CUSTOM PROCEDURE TRAY) ×2 IMPLANT
SHEATH PINNACLE 5F 10CM (SHEATH) ×1 IMPLANT
SYR MEDRAD MARK V 150ML (SYRINGE) ×2 IMPLANT
TRANSDUCER W/STOPCOCK (MISCELLANEOUS) ×2 IMPLANT
WIRE EMERALD 3MM-J .035X150CM (WIRE) ×1 IMPLANT
WIRE EMERALD 3MM-J .035X260CM (WIRE) ×1 IMPLANT
WIRE HI TORQ VERSACORE-J 145CM (WIRE) ×1 IMPLANT

## 2017-02-21 NOTE — Progress Notes (Signed)
Patient Name: [name] Date of Encounter: 02/21/2017  Primary Cardiologist: Dr. Debbe Odea Problem List     Active Problems:   Unstable angina [facility])     Subjective   Had some mild chest pain this AM when going to the bathroom. Now chest pain free  Inpatient Medications    Scheduled Meds: . amLODipine  10 mg Oral Daily  . [START ON 02/22/2017] aspirin EC  81 mg Oral Daily  . B-complex with vitamin C  1 tablet Oral QODAY  . calcium-vitamin D  1 tablet Oral Daily  . cholecalciferol  1,000 Units Oral Daily  . clopidogrel  75 mg Oral Q breakfast  . multivitamin with minerals  1 tablet Oral Daily  . omega-3 acid ethyl esters  1 g Oral Daily  . simvastatin  20 mg Oral Daily  . sodium chloride flush  3 mL Intravenous Q12H  . vitamin C  500 mg Oral Daily   Continuous Infusions: . sodium chloride    . sodium chloride 70 mL/hr at 02/21/17 9417  . heparin 850 Units/hr (02/20/17 2045)   PRN Meds: sodium chloride, acetaminophen, morphine injection, nitroGLYCERIN, ondansetron (ZOFRAN) IV, sodium chloride flush   Vital Signs    Vitals:   02/20/17 1453 02/20/17 1949 02/20/17 2017 02/21/17 0300  BP:  (!) 146/64 (!) 143/63 132/65  Pulse: (!) 58  (!) 55 62  Resp:      Temp:   (!) 97.5 F (36.4 C) (!) 97.5 F (36.4 C)  TempSrc:   Oral Oral  SpO2: 99%  95% 98%  Weight:    170 lb 4.8 oz (77.2 kg)  Height:        Intake/Output Summary (Last 24 hours) at 02/21/17 0757 Last data filed at 02/21/17 0651  Gross per 24 hour  Intake           860.29 ml  Output              725 ml  Net           135.29 ml   Filed Weights   02/20/17 0508 02/20/17 1326 02/21/17 0300  Weight: 170 lb (77.1 kg) 167 lb 8 oz (76 kg) 170 lb 4.8 oz (77.2 kg)    Physical Exam   GEN: Well nourished, well developed, in no acute distress.  HEENT: Grossly normal.  Neck: Supple, no JVD, carotid bruits, or masses. Cardiac: RRR, no murmurs, rubs, or gallops. No clubbing, cyanosis, edema.   Radials/DP/PT 2+ and equal bilaterally. Thrill over right radial artery  Respiratory:  Respirations regular and unlabored, clear to auscultation bilaterally. GI: Soft, nontender, nondistended, BS + x 4. MS: no deformity or atrophy. Skin: warm and dry, no rash. Neuro:  Strength and sensation are intact. Psych: AAOx3.  Normal affect.  Labs    CBC  Recent Labs  02/20/17 0525 02/21/17 0204  WBC 5.4 5.6  HGB 11.9* 11.1*  HCT 36.8* 34.2*  MCV 95.8 95.5  PLT 177 408   Basic Metabolic Panel  Recent Labs  02/20/17 0525 02/21/17 0204  NA 142 144  K 3.8 3.9  CL 111 109  CO2 22 24  GLUCOSE 115* 104*  BUN 36* 35*  CREATININE 1.23 1.22  CALCIUM 9.3 8.8*   Liver Function Tests No results for input(s): AST, ALT, ALKPHOS, BILITOT, PROT, ALBUMIN in the last 72 hours. No results for input(s): LIPASE, AMYLASE in the last 72 hours. Cardiac Enzymes  Recent Labs  02/20/17 1350 02/20/17 1956  02/21/17 0204  TROPONINI <0.03 <0.03 <0.03   BNP Invalid input(s): POCBNP D-Dimer No results for input(s): DDIMER in the last 72 hours. Hemoglobin A1C No results for input(s): HGBA1C in the last 72 hours. Fasting Lipid Panel  Recent Labs  02/21/17 0204  CHOL 109  HDL 46  LDLCALC 53  TRIG 52  CHOLHDL 2.4   Thyroid Function Tests No results for input(s): TSH, T4TOTAL, T3FREE, THYROIDAB in the last 72 hours.  Invalid input(s): FREET3  Telemetry    Sinus brady with 1st degree AV block and 2nd deg AV block Type 1, 3 beats NSVT - Personally Reviewed  ECG    Sinus brady with first deg AV block HR 57 - Personally Reviewed  Radiology    Dg Chest 2 View  Result Date: 02/20/2017 CLINICAL DATA:  Chest pain EXAM: CHEST  2 VIEW COMPARISON:  Chest radiograph 11/19/2006 FINDINGS: Unchanged cardiomegaly with atherosclerotic calcification of the aortic arch. Unchanged calcified granuloma in the left upper lobe. Bibasilar atelectasis without other focal consolidation. No pulmonary edema. No  pneumothorax or pleural effusion. IMPRESSION: Cardiomegaly and bibasilar atelectasis without acute abnormality. Electronically Signed   By: Ulyses Jarred M.D.   On: 02/20/2017 06:21    Cardiac Studies   LHC today.   Patient Profile     Hunter Bachar is a 83 y.o. male with a history of CAD s/p DES to RCA (09/6946) complicated by radial AV fistula, carotid artery disease s/p L CEA, HTN, HLD, CVA who presented to [facility] ED on 02/20/17 with chest pain and progressive DOE.    Assessment & Plan    Unstable angina: started on IV heparin with plans for cath later this afternoon. Of note, we cannot use right radial access given AV fistula from previous cath.    AV fistula: complication of cardiac catheterization 5/17 during right radial artery access. This has been asymptomatic and is managed conservatively.  CAD: s/p PCI with DES to RCA in 5/17.  He has residual disease with an occluded LCx with R-L collaterals, high grade RI disease, occluded apical LAD that is tx medically. He has been on long term DAPT with ASA and plavix. He is not on beta-blocker due to long 1st degree AVB.    HTN: BP currently well controlled. Holding ACEi for cath.   1st Degree AV block: with intermittant 2nd deg AV block Type 1 on tele. He is not on any AV nodal blocking agents. Continue to monitor   HLD: continue statin   Carotid artery disease: s/p left CEA. He is followed by [facility].  Signed, Angelena Form, PA-C  02/21/2017, 7:57 AM   Patient examined chart reviewed Mild pain on going to bathroom this am R/O no acute ECG changes Exam with previous left CEA and residual bruit. Clear lungs no murmur.  BP well controlled on statin for cholesterol Further recommendations based on results of cath with JB today   Jenkins Rouge

## 2017-02-21 NOTE — ED Notes (Signed)
ED Provider at bedside. 

## 2017-02-21 NOTE — Progress Notes (Signed)
ANTICOAGULATION CONSULT NOTE - Initial Consult  Pharmacy Consult for Heparin  Indication: chest pain/ACS  No Known Allergies  Patient Measurements: Height: 5\' 6"  (167.6 cm) Weight: 170 lb (77.1 kg) IBW/kg (Calculated) : 63.8  Vital Signs: Temp: 98.2 F (36.8 C) (07/05 2102) Temp Source: Oral (07/05 2102) BP: 127/67 (07/05 2215) Pulse Rate: 66 (07/05 2215)  Labs:  Recent Labs  02/20/17 0525 02/20/17 1350 02/20/17 1956 02/21/17 0204 02/21/17 0940 02/21/17 2128  HGB 11.9*  --   --  11.1*  --  11.7*  HCT 36.8*  --   --  34.2*  --  35.4*  PLT 177  --   --  162  --  167  LABPROT  --   --   --  14.5  --   --   INR  --   --   --  1.12  --   --   HEPARINUNFRC  --   --  0.75* 0.52 0.52  --   CREATININE 1.23  --   --  1.22  --  1.34*  TROPONINI  --  <0.03 <0.03 <0.03  --   --     Estimated Creatinine Clearance: 38 mL/min (A) (by C-G formula based on SCr of 1.34 mg/dL (H)).   Medical History: Past Medical History:  Diagnosis Date  . AV BLOCK, 1ST DEGREE   . CAD, NATIVE VESSEL    a. LHC 12/2015  3vd Left Cx 100% with colaterals, and distally LAD occluded, DES to prox-mid RCA  b. 02/2017: repeat cath with patent RCA stent and stable CAD  . Carotid artery disease ([place])    a. s/p Left ECA followed by Dr. Donnetta Hutching  . GERD (gastroesophageal reflux disease)   . HLD (hyperlipidemia)   . HTN (hypertension)   . NSVT (nonsustained ventricular tachycardia) ([place])    during stress test 12/2015  . Stroke [facility])     Assessment: 83 y/o M who was just discharged earlier today after having a hearth cath now back to the ED with CP and shortness of breath. Hgb stable. Mild bump in Scr. PTA meds reviewed.   Goal of Therapy:  Heparin level 0.3-0.7 units/ml Monitor platelets by anticoagulation protocol: Yes   Plan:  -NO BOLUS with heart cath earlier today (7/5) -Start heparin at 850 units/hr (previously therapeutic rate) -0800 HL -Daily CBC/HL -Monitor for bleeding  Narda Bonds 02/21/2017,11:13 PM

## 2017-02-21 NOTE — Care Management Obs Status (Signed)
[place] NOTIFICATION   Patient Details  Name: [name] MRN: [id number] Date of Birth: [birth year]   Medicare Observation Status Notification Given:       Maryclare Labrador, RN 02/21/2017, 11:45 AM

## 2017-02-21 NOTE — Progress Notes (Signed)
ANTICOAGULATION CONSULT NOTE - Follow Up Consult  Pharmacy Consult for heparin Indication: chest pain/ACS  Labs:  Recent Labs  02/20/17 0525 02/20/17 1350 02/20/17 1956 02/21/17 0204  HGB 11.9*  --   --  11.1*  HCT 36.8*  --   --  34.2*  PLT 177  --   --  162  HEPARINUNFRC  --   --  0.75* 0.52  CREATININE 1.23  --   --   --   TROPONINI  --  <0.03 <0.03  --     Assessment/Plan:  83yo male therapeutic on heparin after rate change. Will continue gtt at current rate and confirm stable with additional level.   Wynona Neat, PharmD, BCPS  02/21/2017,3:25 AM

## 2017-02-21 NOTE — ED Triage Notes (Addendum)
Pt BIB EMS from home. Generalized CP following a heart cath today. D/C'd to home around 1700. Pain began around 1800. Mildly sweaty, tachypnic for EMS but not complaining of SOB. V/s WNL en route. Given nitro x2 by EMS, which took pain from an 8/10 to 1/10.

## 2017-02-21 NOTE — Discharge Instructions (Signed)

## 2017-02-21 NOTE — ED Notes (Signed)
Cardiology at bedside.

## 2017-02-21 NOTE — ED Notes (Signed)
Attempted to call report

## 2017-02-21 NOTE — ED Notes (Signed)
Patient transported to X-ray 

## 2017-02-21 NOTE — Interval H&P Note (Signed)
Cath Lab Visit (complete for each Cath Lab visit)  Clinical Evaluation Leading to the Procedure:   ACS: Yes.    Non-ACS:    Anginal Classification: CCS III  Anti-ischemic medical therapy: Minimal Therapy (1 class of medications)  Non-Invasive Test Results: No non-invasive testing performed  Prior CABG: No previous CABG      History and Physical Interval Note:  02/21/2017 9:59 AM  [name]  has presented today for surgery, with the diagnosis of unstable angina  The various methods of treatment have been discussed with the patient and family. After consideration of risks, benefits and other options for treatment, the patient has consented to  Procedure(s): Left Heart Cath and Coronary Angiography (N/A) as a surgical intervention .  The patient's history has been reviewed, patient examined, no change in status, stable for surgery.  I have reviewed the patient's chart and labs.  Questions were answered to the patient's satisfaction.     Quay Burow

## 2017-02-21 NOTE — ED Provider Notes (Signed)
[facility] DEPT Provider Note   CSN: [id number] Arrival date & time: 02/21/17  2054     History   Chief Complaint Chief Complaint  Patient presents with  . Chest Pain    HPI [name] is a 83 y.o. male.  The history is provided by the patient.  Chest Pain   This is a recurrent problem. The current episode started 1 to 2 hours ago. The problem occurs constantly. The problem has been resolved. The pain is associated with exertion. The pain is present in the substernal region. The pain is moderate. The quality of the pain is described as heavy. The pain does not radiate. Duration of episode(s) is 20 minutes. Pertinent negatives include no cough, no fever, no lower extremity edema, no malaise/fatigue, no nausea, no shortness of breath and no sputum production. He has tried nitroglycerin for the symptoms. The treatment provided significant relief.  His past medical history is significant for CAD (s/p RCA stenting), hyperlipidemia, hypertension and strokes.  Pertinent negatives for past medical history include no MI.  Procedure history is positive for cardiac catheterization (stable cath performed today).    Past Medical History:  Diagnosis Date  . AV BLOCK, 1ST DEGREE   . CAD, NATIVE VESSEL    a. LHC 12/2015  3vd Left Cx 100% with colaterals, and distally LAD occluded, DES to prox-mid RCA  b. 02/2017: repeat cath with patent RCA stent and stable CAD  . Carotid artery disease ([place])    a. s/p Left ECA followed by Dr. Donnetta Hutching  . GERD (gastroesophageal reflux disease)   . HLD (hyperlipidemia)   . HTN (hypertension)   . NSVT (nonsustained ventricular tachycardia) ([place])    during stress test 12/2015  . Stroke [facility])     Patient Active Problem List   Diagnosis Date Noted  . CAD, NATIVE VESSEL   . Carotid artery disease ([place])   . HLD (hyperlipidemia)   . Unstable angina ([place]) 02/20/2017  . Essential hypertension 01/06/2009  . AV BLOCK, 1ST DEGREE 01/06/2009    Past Surgical  History:  Procedure Laterality Date  . CARDIAC CATHETERIZATION     x2  . CARDIAC CATHETERIZATION N/A 01/11/2016   Procedure: Left Heart Cath and Coronary Angiography;  Surgeon: Wellington Hampshire, MD;  Location: [place] CV LAB;  Service: Cardiovascular;  Laterality: N/A;  . CAROTID ENDARTERECTOMY  11/28/2006   left  . HERNIA REPAIR    . LEFT HEART CATH AND CORONARY ANGIOGRAPHY N/A 02/21/2017   Procedure: Left Heart Cath and Coronary Angiography;  Surgeon: Lorretta Harp, MD;  Location: [facility] CV LAB;  Service: Cardiovascular;  Laterality: N/A;  . TONSILLECTOMY         Home Medications    Prior to Admission medications   Medication Sig Start Date End Date Taking? Authorizing Provider  acetaminophen (TYLENOL) 500 MG tablet Take 1,000 mg by mouth every 6 (six) hours as needed for moderate pain (wrist).   Yes [provider]  aspirin 325 MG tablet Take 325 mg by mouth daily.   Yes [provider]  Colchicine 0.6 MG CAPS Day 1: Take 2 at the first sign of gout and a single tab in 1 hr. On Day 2 take 1 tablet. Continue 1 tab daily until the pain resolves 02/21/17  Yes Stallings, Zoe A, MD  hydrochlorothiazide (MICROZIDE) 12.5 MG capsule Take 12.5 mg by mouth daily. 01/24/17  Yes [provider]  lisinopril (PRINIVIL,ZESTRIL) 40 MG tablet Take 40 mg by mouth daily.  01/24/17  Yes [provider]  amLODipine (NORVASC) 10 MG tablet Take 1 tablet (10 mg total) by mouth daily. 10/26/16   Josue Hector, MD  aspirin EC 81 MG EC tablet Take 1 tablet (81 mg total) by mouth daily. 01/12/16   Reino Bellis B, NP  b complex vitamins tablet Take 1 tablet by mouth every other day.     [provider]  Calcium Carbonate (CALCIUM 500 PO) Take 1 tablet by mouth daily.    [provider]  Cholecalciferol (VITAMIN D-3) 1000 units CAPS Take 1 capsule by mouth daily.    [provider]  clopidogrel (PLAVIX) 75 MG tablet Take 1 tablet (75 mg total)  by mouth daily with breakfast. 02/21/17   Eileen Stanford, PA-C  Glucosamine 500 MG CAPS Take 1 capsule by mouth daily.     [provider]  Multiple Vitamin (MULTIVITAMIN) capsule Take 1 capsule by mouth daily.    [provider]  nitroGLYCERIN (NITROSTAT) 0.4 MG SL tablet Place 1 tablet (0.4 mg total) under the tongue every 5 (five) minutes x 3 doses as needed for chest pain. 01/12/16   Cheryln Manly, NP  Omega-3 Fatty Acids (FISH OIL) 1000 MG CAPS Take 1 capsule by mouth daily.    [provider]  simvastatin (ZOCOR) 20 MG tablet Take 1 tablet (20 mg total) by mouth daily. 10/26/16   Josue Hector, MD  vitamin C (ASCORBIC ACID) 500 MG tablet Take 500 mg by mouth daily.      [provider]    Family History Family History  Problem Relation Age of Onset  . Diabetes Mother   . Diabetes Sister   . Cancer Sister 45       colon  . Heart disease Sister     Social History Social History  Substance Use Topics  . Smoking status: Never Smoker  . Smokeless tobacco: Never Used  . Alcohol use 9.0 oz/week    15 Glasses of wine per week     Allergies   Patient has no known allergies.   Review of Systems Review of Systems  Constitutional: Negative for fever and malaise/fatigue.  Respiratory: Negative for cough, sputum production and shortness of breath.   Cardiovascular: Positive for chest pain.  Gastrointestinal: Negative for nausea.   All other systems are reviewed and are negative for acute change except as noted in the HPI   Physical Exam Updated Vital Signs BP 137/70 (BP Location: Left Arm)   Pulse 66   Temp 98.2 F (36.8 C) (Oral)   Resp 15   Ht 5\' 6"  (1.676 m)   Wt 77.1 kg (170 lb)   SpO2 94%   BMI 27.44 kg/m   Physical Exam  Constitutional: He is oriented to person, place, and time. He appears well-developed and well-nourished. No distress.  HENT:  Head: Normocephalic and atraumatic.  Nose: Nose normal.  Eyes:  Conjunctivae and EOM are normal. Pupils are equal, round, and reactive to light. Right eye exhibits no discharge. Left eye exhibits no discharge. No scleral icterus.  Neck: Normal range of motion. Neck supple.  Cardiovascular: Normal rate and regular rhythm.  Exam reveals no gallop and no friction rub.   No murmur heard. Pulmonary/Chest: Effort normal and breath sounds normal. No stridor. No respiratory distress. He has no rales.  Abdominal: Soft. He exhibits no distension. There is no tenderness.  Musculoskeletal: He exhibits no edema or tenderness.       Legs:  Neurological: He is alert and oriented to person, place, and time.  Skin: Skin is warm and dry. No rash noted. He is not diaphoretic. No erythema.  Psychiatric: He has a normal mood and affect.  Vitals reviewed.    ED Treatments / Results  Labs (all labs ordered are listed, but only abnormal results are displayed) Labs Reviewed  BASIC METABOLIC PANEL - Abnormal; Notable for the following:       Result Value   CO2 18 (*)    Glucose, Bld 129 (*)    BUN 31 (*)    Creatinine, Ser 1.34 (*)    Calcium 8.5 (*)    GFR calc non Af Amer 46 (*)    GFR calc Af Amer 53 (*)    All other components within normal limits  CBC - Abnormal; Notable for the following:    RBC 3.74 (*)    Hemoglobin 11.7 (*)    HCT 35.4 (*)    All other components within normal limits  HEPARIN LEVEL (UNFRACTIONATED)  I-STAT TROPOININ, ED    EKG  EKG Interpretation  Date/Time:  Thursday February 21 2017 21:03:19 EDT Ventricular Rate:  64 PR Interval:    QRS Duration: 103 QT Interval:  417 QTC Calculation: 431 R Axis:   -11 Text Interpretation:  Sinus rhythm Sinus pause Prolonged PR interval ST depr, consider ischemia, anterolateral lds Otherwise no significant change Confirmed by Addison Lank [phone]) on 02/21/2017 11:13:35 PM       Radiology Dg Chest 2 View  Result Date: 02/21/2017 CLINICAL DATA:  Acute chest pain. Patient with cardiac  catheterization performed earlier today. EXAM: CHEST  2 VIEW COMPARISON:  02/20/2017 and 11/19/2006 radiographs FINDINGS: Cardiomegaly identified.  The mediastinal silhouette is unchanged. Mild bibasilar scarring and thoracic aortic atherosclerotic calcification again noted. There is no evidence of focal airspace disease, pulmonary edema, suspicious pulmonary nodule/mass, pleural effusion, or pneumothorax. No acute bony abnormalities are identified. IMPRESSION: Cardiomegaly without evidence of acute cardiopulmonary disease. Electronically Signed   By: Margarette [place] M.D.   On: 02/21/2017 21:53   Dg Chest 2 View  Result Date: 02/20/2017 CLINICAL DATA:  Chest pain EXAM: CHEST  2 VIEW COMPARISON:  Chest radiograph 11/19/2006 FINDINGS: Unchanged cardiomegaly with atherosclerotic calcification of the aortic arch. Unchanged calcified granuloma in the left upper lobe. Bibasilar atelectasis without other focal consolidation. No pulmonary edema. No pneumothorax or pleural effusion. IMPRESSION: Cardiomegaly and bibasilar atelectasis without acute abnormality. Electronically Signed   By: Ulyses Jarred M.D.   On: 02/20/2017 06:21    Procedures Procedures (including critical care time)   Medications Ordered in ED Medications  nitroGLYCERIN (NITROSTAT) SL tablet 0.4 mg (0.4 mg Sublingual Given 02/21/17 2204)  heparin ADULT infusion 100 units/mL (25000 units/276mL sodium chloride 0.45%) (not administered)     Initial Impression / Assessment and Plan / ED Course  I have reviewed the triage vital signs and the nursing notes.  Pertinent labs & imaging results that were available during my care of the patient were reviewed by me and considered in my medical decision making (see chart for details).     Exertional chest pain. EKG with mild ST elevation in aVR and diffuse ST depression concerning for possible left main however catheterization performed earlier today was stable from prior catheterization last year  without any left main occlusion. Initial troponin negative. Pain control with nitroglycerin. Patient already received 324 of aspirin. Case was discussed with cardiology who requested for heparin gtt and admission for trop trending.  Fatima Blank, MD 02/21/17 [phone]

## 2017-02-21 NOTE — Progress Notes (Signed)
Chief Complaint  Patient presents with  . Wrist Pain    left wrist    HPI   Pt reports that this morning before his cardiac catheterization he started having pain at the base of the left thumb. He is right handed. He denies injury. He reports that now he has swelling and pain of the right hand. He reports that he did not have an IV placed at the left wrist of AC  He reports that his cath was good.  Review of cardiology notes shows that he has stable CAD with left RCA stent.  He has follow up with Cardiology in one week.  He denies chest pain presently.  Lab Results  Component Value Date   CREATININE 1.22 02/21/2017      Past Medical History:  Diagnosis Date  . AV BLOCK, 1ST DEGREE   . CAD, NATIVE VESSEL    a. LHC 12/2015  3vd Left Cx 100% with colaterals, and distally LAD occluded, DES to prox-mid RCA  b. 02/2017: repeat cath with patent RCA stent and stable CAD  . Carotid artery disease ([place])    a. s/p Left ECA followed by Dr. Donnetta Hutching  . GERD (gastroesophageal reflux disease)   . HLD (hyperlipidemia)   . HTN (hypertension)   . NSVT (nonsustained ventricular tachycardia) ([place])    during stress test 12/2015  . Stroke [facility])     Current Outpatient Prescriptions  Medication Sig Dispense Refill  . amLODipine (NORVASC) 10 MG tablet Take 1 tablet (10 mg total) by mouth daily. 90 tablet 3  . aspirin EC 81 MG EC tablet Take 1 tablet (81 mg total) by mouth daily.    Marland Kitchen b complex vitamins tablet Take 1 tablet by mouth every other day.     . Calcium Carbonate (CALCIUM 500 PO) Take 1 tablet by mouth daily.    . Cholecalciferol (VITAMIN D-3) 1000 units CAPS Take 1 capsule by mouth daily.    . clopidogrel (PLAVIX) 75 MG tablet Take 1 tablet (75 mg total) by mouth daily with breakfast. 90 tablet 2  . Glucosamine 500 MG CAPS Take 1 capsule by mouth daily.     . Multiple Vitamin (MULTIVITAMIN) capsule Take 1 capsule by mouth daily.    . Omega-3 Fatty Acids (FISH OIL) 1000 MG CAPS Take 1  capsule by mouth daily.    . simvastatin (ZOCOR) 20 MG tablet Take 1 tablet (20 mg total) by mouth daily. 90 tablet 3  . vitamin C (ASCORBIC ACID) 500 MG tablet Take 500 mg by mouth daily.      . Colchicine 0.6 MG CAPS Day 1: Take 2 at the first sign of gout and a single tab in 1 hr. On Day 2 take 1 tablet. Continue 1 tab daily until the pain resolves 10 capsule 0  . nitroGLYCERIN (NITROSTAT) 0.4 MG SL tablet Place 1 tablet (0.4 mg total) under the tongue every 5 (five) minutes x 3 doses as needed for chest pain. (Patient not taking: Reported on 02/21/2017) 25 tablet 3   No current facility-administered medications for this visit.     Allergies: No Known Allergies  Past Surgical History:  Procedure Laterality Date  . CARDIAC CATHETERIZATION     x2  . CARDIAC CATHETERIZATION N/A 01/11/2016   Procedure: Left Heart Cath and Coronary Angiography;  Surgeon: Wellington Hampshire, MD;  Location: [facility] LAB;  Service: Cardiovascular;  Laterality: N/A;  . CAROTID ENDARTERECTOMY  11/28/2006   left  . HERNIA REPAIR    .  LEFT HEART CATH AND CORONARY ANGIOGRAPHY N/A 02/21/2017   Procedure: Left Heart Cath and Coronary Angiography;  Surgeon: Lorretta Harp, MD;  Location: [place] CV LAB;  Service: Cardiovascular;  Laterality: N/A;  . TONSILLECTOMY      Social History   Social History  . Marital status: Married    Spouse name: N/A  . Number of children: N/A  . Years of education: N/A   Social History Main Topics  . Smoking status: Never Smoker  . Smokeless tobacco: Never Used  . Alcohol use 9.0 oz/week    15 Glasses of wine per week  . Drug use: No  . Sexual activity: Not Asked   Other Topics Concern  . None   Social History Narrative  . None    ROS  Objective: Vitals:   02/21/17 1523  BP: (!) 157/66  Pulse: 74  Resp: 16  Temp: 97.7 F (36.5 C)  TempSrc: Oral  SpO2: 95%  Weight: 170 lb (77.1 kg)  Height: 5\' 6"  (1.676 m)   BP Readings from Last 3 Encounters:    02/21/17 (!) 157/66  02/21/17 (!) 145/62  12/06/16 (!) 185/93    Physical Exam  Constitutional: He is oriented to person, place, and time. He appears well-developed and well-nourished.  HENT:  Head: Normocephalic and atraumatic.  Cardiovascular: Normal rate, regular rhythm and normal heart sounds.   Pulmonary/Chest: Effort normal and breath sounds normal. No respiratory distress. He has no wheezes.  Neurological: He is alert and oriented to person, place, and time.    Tenderness to palpation at the left MCP joint of the thumb   Assessment and Plan Zian was seen today for wrist pain.  Diagnoses and all orders for this visit:  Thumb pain, left- unable to add on uric acid to blood already drawn.  Discussed that he most likely has gout. He should follow up if symptoms do not improve -     Cancel: Uric acid; Future  Acute gout of left wrist, unspecified cause- advised tylenol for pain Stop zocor while on colchicine Discussed how to take colchicine   Coronary artery disease involving coronary bypass graft of native heart with angina pectoris [facility]- reviewed cardiology note. Since pt is in pain this should explain the bp but based on today's cardiac cath and his current heart meds would not make adjustments  Other orders -     Discontinue: Colchicine 0.6 MG CAPS; Day 1: Take 2 at the first sign of gout and a single tab in 1 hr. On Day 2 take 1 tablet. Continue 1 tab daily until the pain resolves -     Colchicine 0.6 MG CAPS; Day 1: Take 2 at the first sign of gout and a single tab in 1 hr. On Day 2 take 1 tablet. Continue 1 tab daily until the pain resolves     [place]

## 2017-02-21 NOTE — Discharge Summary (Signed)
Discharge Summary    Patient ID: [name],  MRN: [id number], DOB/AGE: 02-16-1928 83 y.o.  Admit date: 02/20/2017 Discharge date: 02/21/2017  Primary Care Provider: Helane Rima Primary Cardiologist: Dr. Johnsie Cancel  Discharge Diagnoses    Principal Problem:   Unstable angina [facility]) Active Problems:   Essential hypertension   AV BLOCK, 1ST DEGREE   CAD, NATIVE VESSEL   Carotid artery disease ([facility])   HLD (hyperlipidemia)   Allergies No Known Allergies  Diagnostic Studies/Procedures    02/21/17 Left Heart Cath and Coronary Angiography  Conclusion    Prox RCA to Mid RCA lesion, 0 %stenosed.  Mid RCA lesion, 30 %stenosed.  Dist RCA lesion, 70 %stenosed.  Ost Cx to Prox Cx lesion, 100 %stenosed.  Ost Ramus lesion, 95 %stenosed.  Dist LAD lesion, 100 %stenosed.  Mid LAD lesion, 50 %stenosed.  Ramus-1 lesion, 70 %stenosed.  Ramus-2 lesion, 90 %stenosed.  Ost LAD to Prox LAD lesion, 60 %stenosed.  The left ventricular systolic function is normal.  LV end diastolic pressure is normal.  The left ventricular ejection fraction is 55-65% by visual estimate.  IMPRESSION: [name] had a widely patent RCA stent placed one year ago. He does have a moderate distal RCA lesion just before the takeoff of the PDA which is unchanged from his prior cath. He has an occluded circumflex which is nondominant and high-grade ostial, proximal and mid ramus branch stenosis as well as scattered noncritical LAD disease. He does have an apical LAD occlusion which is old as well. I do not think that his anatomy is clinically changed from a year ago. I recommend continued medical therapy. A right femoral angiogram was performed and a MYNX closure device was successfully deployed achieving hemostasis. The patient can be discharged home later today with medical therapy.    _____________   History of Present Illness     [name] is a 83 y.o. male with a history of CAD s/p DES to RCA  (01/7123) complicated by radial AV fistula, carotid artery disease s/p L CEA, HTN, HLD, CVA who presented to [facility] ED on 02/20/17 with chest pain and progressive DOE.    He had an abnormal stress test in 5/17 with nonsustained ventricular tachycardia and ST segment depression. He underwent LHC which demonstrated 3 V CAD with an occluded LCx with R-L collaterals, occluded apical LAD, borderline disease in the RI, severe stenosis in the RCA.  He underwent PCI with a DES to the RCA.  He had some proximal LAD disease that did not appear to be significant. It was not a good target for CABG. LCx was also not a good target and there were left-right collaterals. Therefore, residual disease treated medically. Last seen by Dr. Johnsie Cancel in 8/17. He did follow-up with Dr. Fletcher Anon for right radial artery AV fistula that occurred after his heart cath. This was asymptomatic and did not require further intervention.    He was in his usual state of health until 02/20/17 when he woke at 3 am with chest pain. He continued to have chest pain and finally called EMS who brought him to [facility] ED. Upon arrival he also complained of progressive DOE. He was admitted for unstable angina.   Hospital Course     Consultants: none   Chest pain concerning for [place]: he was started on IV heparin and underwent cath on 02/21/17 which showed stable CAD and patent RCA stent.    AV fistula: complication of cardiac catheterization 5/17 during right radial  artery access. This has been asymptomatic and is managed conservatively.  CAD: s/p PCI with DES to RCA in 5/17.Cath his admission showed mod dRCA, occluded LCx, high grade ostial, prox and mid ramus stenosis as well as scattered noncritical LAD disease and apical LAD occlusion. Overall, his anatomy is unchanged from cath last year. He has been on long term DAPT with ASA and plavix. He is not on beta-blocker due to long 1st degree AVB.   HTN: BP currently well controlled. Holding ACEi for cath.    1st Degree AV block: with intermittant 2nd deg AV block Type 1 on tele. He is not on any AV nodal blocking agents. Continue to monitor   HLD: continue statin   Carotid artery disease: s/p left CEA. He is followed by [facility].  _____________  Discharge Vitals Blood pressure (!) 145/62, pulse (!) 53, temperature 97.8 F (36.6 C), temperature source Oral, resp. rate 19, height 5\' 6"  (1.676 m), weight 170 lb 4.8 oz (77.2 kg), SpO2 93 %.  Filed Weights   02/20/17 0508 02/20/17 1326 02/21/17 0300  Weight: 170 lb (77.1 kg) 167 lb 8 oz (76 kg) 170 lb 4.8 oz (77.2 kg)    Labs & Radiologic Studies    CBC  Recent Labs  02/20/17 0525 02/21/17 0204  WBC 5.4 5.6  HGB 11.9* 11.1*  HCT 36.8* 34.2*  MCV 95.8 95.5  PLT 177 712   Basic Metabolic Panel  Recent Labs  02/20/17 0525 02/21/17 0204  NA 142 144  K 3.8 3.9  CL 111 109  CO2 22 24  GLUCOSE 115* 104*  BUN 36* 35*  CREATININE 1.23 1.22  CALCIUM 9.3 8.8*   Liver Function Tests No results for input(s): AST, ALT, ALKPHOS, BILITOT, PROT, ALBUMIN in the last 72 hours. No results for input(s): LIPASE, AMYLASE in the last 72 hours. Cardiac Enzymes  Recent Labs  02/20/17 1350 02/20/17 1956 02/21/17 0204  TROPONINI <0.03 <0.03 <0.03   BNP Invalid input(s): POCBNP D-Dimer No results for input(s): DDIMER in the last 72 hours. Hemoglobin A1C No results for input(s): HGBA1C in the last 72 hours. Fasting Lipid Panel  Recent Labs  02/21/17 0204  CHOL 109  HDL 46  LDLCALC 53  TRIG 52  CHOLHDL 2.4   Thyroid Function Tests No results for input(s): TSH, T4TOTAL, T3FREE, THYROIDAB in the last 72 hours.  Invalid input(s): FREET3 _____________  Dg Chest 2 View  Result Date: 02/20/2017 CLINICAL DATA:  Chest pain EXAM: CHEST  2 VIEW COMPARISON:  Chest radiograph 11/19/2006 FINDINGS: Unchanged cardiomegaly with atherosclerotic calcification of the aortic arch. Unchanged calcified granuloma in the left upper lobe. Bibasilar  atelectasis without other focal consolidation. No pulmonary edema. No pneumothorax or pleural effusion. IMPRESSION: Cardiomegaly and bibasilar atelectasis without acute abnormality. Electronically Signed   By: Ulyses Jarred M.D.   On: 02/20/2017 06:21   Disposition   Pt is being discharged home today in good condition.  Follow-up Plans & Appointments    Follow-up Information    Eileen Stanford, PA-C. Go on 02/28/2017.   Specialties:  Cardiology, Radiology Why:  @ 11:00am, please arrive at least 10 minutes early  Contact information: [place] [id number] [phone]            Discharge Medications   Current Discharge Medication List    CONTINUE these medications which have CHANGED   Details  clopidogrel (PLAVIX) 75 MG tablet Take 1 tablet (75 mg total) by mouth daily with  breakfast. Qty: 90 tablet, Refills: 2   Associated Diagnoses: Bruit (arterial)      CONTINUE these medications which have NOT CHANGED   Details  amLODipine (NORVASC) 10 MG tablet Take 1 tablet (10 mg total) by mouth daily. Qty: 90 tablet, Refills: 3    aspirin EC 81 MG EC tablet Take 1 tablet (81 mg total) by mouth daily.    b complex vitamins tablet Take 1 tablet by mouth every other day.     Calcium Carbonate (CALCIUM 500 PO) Take 1 tablet by mouth daily.    Cholecalciferol (VITAMIN D-3) 1000 units CAPS Take 1 capsule by mouth daily.    Glucosamine 500 MG CAPS Take 1 capsule by mouth daily.     Multiple Vitamin (MULTIVITAMIN) capsule Take 1 capsule by mouth daily.    nitroGLYCERIN (NITROSTAT) 0.4 MG SL tablet Place 1 tablet (0.4 mg total) under the tongue every 5 (five) minutes x 3 doses as needed for chest pain. Qty: 25 tablet, Refills: 3    Omega-3 Fatty Acids (FISH OIL) 1000 MG CAPS Take 1 capsule by mouth daily.    simvastatin (ZOCOR) 20 MG tablet Take 1 tablet (20 mg total) by mouth daily. Qty: 90 tablet, Refills: 3    vitamin C (ASCORBIC ACID) 500  MG tablet Take 500 mg by mouth daily.           Outstanding Labs/Studies   None   Duration of Discharge Encounter   Greater than 30 minutes including physician time.  Signed, Angelena Form NP 02/21/2017, 12:51 PM

## 2017-02-21 NOTE — Patient Instructions (Addendum)
Day 1: 1.2 mg (2 tablets) at the first sign of flare, followed in 1 hour with a single dose of 0.6 mg   Day 2 and thereafter: 0.6 mg once daily until flare resolves    IF you received an x-ray today, you will receive an invoice from [facility]. Please contact [facility] at [phone] with questions or concerns regarding your invoice.   IF you received labwork today, you will receive an invoice from [facility]. Please contact [facility] at [phone] with questions or concerns regarding your invoice.   Our billing staff will not be able to assist you with questions regarding bills from these companies.  You will be contacted with the lab results as soon as they are available. The fastest way to get your results is to activate your My Chart account. Instructions are located on the last page of this paperwork. If you have not heard from [place] regarding the results in 2 weeks, please contact this office.     Gout Gout is painful swelling that can occur in some of your joints. Gout is a type of arthritis. This condition is caused by having too much uric acid in your body. Uric acid is a chemical that forms when your body breaks down substances called purines. Purines are important for building body proteins. When your body has too much uric acid, sharp crystals can form and build up inside your joints. This causes pain and swelling. Gout attacks can happen quickly and be very painful (acute gout). Over time, the attacks can affect more joints and become more frequent (chronic gout). Gout can also cause uric acid to build up under your skin and inside your kidneys. What are the causes? This condition is caused by too much uric acid in your blood. This can occur because:  Your kidneys do not remove enough uric acid from your blood. This is the most common cause.  Your body makes too much uric acid. This can occur with some cancers and cancer treatments. It can also occur if your  body is breaking down too many red blood cells (hemolytic anemia).  You eat too many foods that are high in purines. These foods include organ meats and some seafood. Alcohol, especially beer, is also high in purines.  A gout attack may be triggered by trauma or stress. What increases the risk? This condition is more likely to develop in people who:  Have a family history of gout.  Are male and middle-aged.  Are male and have gone through menopause.  Are obese.  Frequently drink alcohol, especially beer.  Are dehydrated.  Lose weight too quickly.  Have an organ transplant.  Have lead poisoning.  Take certain medicines, including aspirin, cyclosporine, diuretics, levodopa, and niacin.  Have kidney disease or psoriasis.  What are the signs or symptoms? An attack of acute gout happens quickly. It usually occurs in just one joint. The most common place is the big toe. Attacks often start at night. Other joints that may be affected include joints of the feet, ankle, knee, fingers, wrist, or elbow. Symptoms may include:  Severe pain.  Warmth.  Swelling.  Stiffness.  Tenderness. The affected joint may be very painful to touch.  Shiny, red, or purple skin.  Chills and fever.  Chronic gout may cause symptoms more frequently. More joints may be involved. You may also have white or yellow lumps (tophi) on your hands or feet or in other areas near your joints. How is this diagnosed? This  condition is diagnosed based on your symptoms, medical history, and physical exam. You may have tests, such as:  Blood tests to measure uric acid levels.  Removal of joint fluid with a needle (aspiration) to look for uric acid crystals.  X-rays to look for joint damage.  How is this treated? Treatment for this condition has two phases: treating an acute attack and preventing future attacks. Acute gout treatment may include medicines to reduce pain and swelling,  including:  NSAIDs.  Steroids. These are strong anti-inflammatory medicines that can be taken by mouth (orally) or injected into a joint.  Colchicine. This medicine relieves pain and swelling when it is taken soon after an attack. It can be given orally or through an IV tube.  Preventive treatment may include:  Daily use of smaller doses of NSAIDs or colchicine.  Use of a medicine that reduces uric acid levels in your blood.  Changes to your diet. You may need to see a specialist about healthy eating (dietitian).  Follow these instructions at home: During a Gout Attack  If directed, apply ice to the affected area: ? Put ice in a plastic bag. ? Place a towel between your skin and the bag. ? Leave the ice on for 20 minutes, 2-3 times a day.  Rest the joint as much as possible. If the affected joint is in your leg, you may be given crutches to use.  Raise (elevate) the affected joint above the level of your heart as often as possible.  Drink enough fluids to keep your urine clear or pale yellow.  Take over-the-counter and prescription medicines only as told by your health care provider.  Do not drive or operate heavy machinery while taking prescription pain medicine.  Follow instructions from your health care provider about eating or drinking restrictions.  Return to your normal activities as told by your health care provider. Ask your health care provider what activities are safe for you. Avoiding Future Gout Attacks  Follow a low-purine diet as told by your dietitian or health care provider. Avoid foods and drinks that are high in purines, including liver, kidney, anchovies, asparagus, herring, mushrooms, mussels, and beer.  Limit alcohol intake to no more than 1 drink a day for nonpregnant women and 2 drinks a day for men. One drink equals 12 oz of beer, 5 oz of wine, or 1 oz of hard liquor.  Maintain a healthy weight or lose weight if you are overweight. If you want to  lose weight, talk with your health care provider. It is important that you do not lose weight too quickly.  Start or maintain an exercise program as told by your health care provider.  Drink enough fluids to keep your urine clear or pale yellow.  Take over-the-counter and prescription medicines only as told by your health care provider.  Keep all follow-up visits as told by your health care provider. This is important. Contact a health care provider if:  You have another gout attack.  You continue to have symptoms of a gout attack after10 days of treatment.  You have side effects from your medicines.  You have chills or a fever.  You have burning pain when you urinate.  You have pain in your lower back or belly. Get help right away if:  You have severe or uncontrolled pain.  You cannot urinate. This information is not intended to replace advice given to you by your health care provider. Make sure you discuss any questions you  have with your health care provider. Document Released: 08/03/2000 Document Revised: 01/12/2016 Document Reviewed: 05/19/2015 Elsevier Interactive Patient Education  2017 [employer].

## 2017-02-21 NOTE — H&P (Addendum)
CARDIOLOGY H&P Primary Cardiologist:  Dr Johnsie Cancel  HPI: [name] is an 83 yo male with a pmhx of CAD s/p DES to RCA complicated by radial AV fistula, carotid stenosis s/pCEA, HTN, HLD, and CVA who presents for evaluation of chest pain.  Pt was discharged earlier this morning following an admission for unstable angina.  Troponins were negative and he underwent LHC which revealed stable severe CAD (mod dRCA, occluded LCx, high grade ostial, prox and mid ramus stenosis as well as scattered noncritical LAD disease and apical LAD occlusion) and a patent RCA stent.  He was discharged on medical therapy.    Pt states that following his discharge he was seen by his PCP for what appeared to be a gout flare in his left wrist.  He was started on colchicine and tylenol.  He subsequently went home, where a short time later he was going up his stairs in his tri-level home when he had acute onset of midsternal chest pain described as an 8/10 pressure which did not radiate and did not resolve after 20 minutes.  Denies SOB, palpitations, or dizziness a/w the symptoms. He subsequently called EMS who administered 2 SL NTG en route, which improved his pain.  Initial EKG by EMS showed significant inferolateral ST depression with ST elevation in aVR, which was a significant change compared to admission EKG on 02/20/17.  Upon evaluation in the ED, his chest pain had essentially resolved, however ST changes were still evident on EKG.  Initial troponin was negative.  Cardiology asked to evaluate for unstable angina.   At the time of my exam, pts only complaint was of some pain around his left wrist.  He denies CP, SOB, palpitaitons, LE edema, fevers, chills, or dizziness.  He is not currently on BB due to hx of AV block in the past (predominantly 1st degree, but has hx of intermittent 2nd degree)    Review of Systems:     Cardiac Review of Systems: {Y] = yes [ ]  = no  Chest Pain [  Y  ]  Resting SOB [   ] Exertional SOB  [  ]    Orthopnea [  ]   Pedal Edema [   ]    Palpitations [  ] Syncope  [  ]   Presyncope [   ]  General Review of Systems: [Y] = yes [  ]=no Constitional: recent weight change [  ]; anorexia [  ]; fatigue [  ]; nausea [  ]; night sweats [  ]; fever [  ]; or chills [  ];                                                                     Dental: poor dentition[  ];   Eye : blurred vision [  ]; diplopia [   ]; vision changes [  ];  Amaurosis fugax[  ]; Resp: cough [  ];  wheezing[  ];  hemoptysis[  ]; shortness of breath[  ]; paroxysmal nocturnal dyspnea[  ]; dyspnea on exertion[  ]; or orthopnea[  ];  GI:  gallstones[  ], vomiting[  ];  dysphagia[  ]; melena[  ];  hematochezia [  ]; heartburn[  ];  GU: kidney stones [  ]; hematuria[  ];   dysuria [  ];  nocturia[  ];               Skin: rash [  ], swelling[  ];, hair loss[  ];  peripheral edema[  ];  or itching[  ]; Musculosketetal: myalgias[  ];  joint swelling[  ];  joint erythema[  ];  joint pain[  ];  back pain[  ];  Heme/Lymph: bruising[  ];  bleeding[  ];  anemia[  ];  Neuro: TIA[  ];  headaches[  ];  stroke[  ];  vertigo[  ];  seizures[  ];   paresthesias[  ];  difficulty walking[  ];  Psych:depression[  ]; anxiety[  ];  Endocrine: diabetes[  ];  thyroid dysfunction[  ];  Other:  Past Medical History:  Diagnosis Date  . AV BLOCK, 1ST DEGREE   . CAD, NATIVE VESSEL    a. LHC 12/2015  3vd Left Cx 100% with colaterals, and distally LAD occluded, DES to prox-mid RCA  b. 02/2017: repeat cath with patent RCA stent and stable CAD  . Carotid artery disease ([place])    a. s/p Left ECA followed by Dr. Donnetta Hutching  . GERD (gastroesophageal reflux disease)   . HLD (hyperlipidemia)   . HTN (hypertension)   . NSVT (nonsustained ventricular tachycardia) ([place])    during stress test 12/2015  . Stroke [facility])     No Known Allergies  Social History   Social History  . Marital status: Married    Spouse name: N/A  . Number of children: N/A  . Years of  education: N/A   Occupational History  . Not on file.   Social History Main Topics  . Smoking status: Never Smoker  . Smokeless tobacco: Never Used  . Alcohol use 9.0 oz/week    15 Glasses of wine per week  . Drug use: No  . Sexual activity: Not on file   Other Topics Concern  . Not on file   Social History Narrative  . No narrative on file    Family History  Problem Relation Age of Onset  . Diabetes Mother   . Diabetes Sister   . Cancer Sister 82       colon  . Heart disease Sister     PHYSICAL EXAM: Vitals:   02/21/17 2300 02/21/17 2330  BP: 136/66 130/65  Pulse: 64 (!) 59  Resp: (!) 23 19  Temp:     General:  Well appearing. No respiratory difficulty HEENT: normal Neck: supple. no JVD. Carotids 2+ bilat; no bruits. No lymphadenopathy or thryomegaly appreciated. Cor: PMI nondisplaced. Regular rate & rhythm. No rubs, gallops or murmurs. Lungs: clear Abdomen: soft, nontender, nondistended. No hepatosplenomegaly. No bruits or masses. Good bowel sounds. Extremities: no cyanosis, clubbing, rash, edema. Palpable thrill and +bruit in right wrist Neuro: alert & oriented x 3, cranial nerves grossly intact. moves all 4 extremities w/o difficulty. Affect pleasant.  ECG: NSR with first degree AV block and ?sinus pause vs 2nd degree AV block, inferolateral STD significantly changed from baseline study   Results for orders placed or performed during the hospital encounter of 02/21/17 (from the past 24 hour(s))  Basic metabolic panel     Status: Abnormal   Collection Time: 02/21/17  9:28 PM  Result Value Ref Range   Sodium 135 135 - 145 mmol/L   Potassium 3.5 3.5 - 5.1 mmol/L   Chloride 107 101 - 111 mmol/L  CO2 18 (L) 22 - 32 mmol/L   Glucose, Bld 129 (H) 65 - 99 mg/dL   BUN 31 (H) 6 - 20 mg/dL   Creatinine, Ser 1.34 (H) 0.61 - 1.24 mg/dL   Calcium 8.5 (L) 8.9 - 10.3 mg/dL   GFR calc non Af Amer 46 (L) >60 mL/min   GFR calc Af Amer 53 (L) >60 mL/min   Anion gap 10 5  - 15  CBC     Status: Abnormal   Collection Time: 02/21/17  9:28 PM  Result Value Ref Range   WBC 8.1 4.0 - 10.5 K/uL   RBC 3.74 (L) 4.22 - 5.81 MIL/uL   Hemoglobin 11.7 (L) 13.0 - 17.0 g/dL   HCT 35.4 (L) 39.0 - 52.0 %   MCV 94.7 78.0 - 100.0 fL   MCH 31.3 26.0 - 34.0 pg   MCHC 33.1 30.0 - 36.0 g/dL   RDW 13.3 11.5 - 15.5 %   Platelets 167 150 - 400 K/uL  I-stat troponin, ED     Status: None   Collection Time: 02/21/17  9:31 PM  Result Value Ref Range   Troponin i, poc 0.04 0.00 - 0.08 ng/mL   Comment 3           Dg Chest 2 View  Result Date: 02/21/2017 CLINICAL DATA:  Acute chest pain. Patient with cardiac catheterization performed earlier today. EXAM: CHEST  2 VIEW COMPARISON:  02/20/2017 and 11/19/2006 radiographs FINDINGS: Cardiomegaly identified.  The mediastinal silhouette is unchanged. Mild bibasilar scarring and thoracic aortic atherosclerotic calcification again noted. There is no evidence of focal airspace disease, pulmonary edema, suspicious pulmonary nodule/mass, pleural effusion, or pneumothorax. No acute bony abnormalities are identified. IMPRESSION: Cardiomegaly without evidence of acute cardiopulmonary disease. Electronically Signed   By: Margarette Canada M.D.   On: 02/21/2017 21:53   Dg Chest 2 View  Result Date: 02/20/2017 CLINICAL DATA:  Chest pain EXAM: CHEST  2 VIEW COMPARISON:  Chest radiograph 11/19/2006 FINDINGS: Unchanged cardiomegaly with atherosclerotic calcification of the aortic arch. Unchanged calcified granuloma in the left upper lobe. Bibasilar atelectasis without other focal consolidation. No pulmonary edema. No pneumothorax or pleural effusion. IMPRESSION: Cardiomegaly and bibasilar atelectasis without acute abnormality. Electronically Signed   By: Ulyses Jarred M.D.   On: 02/20/2017 06:21   02/21/17 Left Heart Cath and Coronary Angiography  Conclusion    Prox RCA to Mid RCA lesion, 0 %stenosed.  Mid RCA lesion, 30 %stenosed.  Dist RCA lesion, 70  %stenosed.  Ost Cx to Prox Cx lesion, 100 %stenosed.  Ost Ramus lesion, 95 %stenosed.  Dist LAD lesion, 100 %stenosed.  Mid LAD lesion, 50 %stenosed.  Ramus-1 lesion, 70 %stenosed.  Ramus-2 lesion, 90 %stenosed.  Ost LAD to Prox LAD lesion, 60 %stenosed.  The left ventricular systolic function is normal.  LV end diastolic pressure is normal.  The left ventricular ejection fraction is 55-65% by visual estimate.  IMPRESSION:Mr. Komar had a widely patent RCA stent placed one year ago. He does have a moderate distal RCA lesion just before the takeoff of the PDA which is unchanged from his prior cath. He has an occluded circumflex which is nondominant and high-grade ostial, proximal and mid ramus branch stenosis as well as scattered noncritical LAD disease. He does have an apical LAD occlusion which is old as well. I do not think that his anatomy is clinically changed from a year ago. I recommend continued medical therapy. A right femoral angiogram was performed and a MYNX closure  device was successfully deployed achieving hemostasis. The patient can be discharged home later today with medical therapy.    ASSESSMENT: 83 yo male with a pmhx of severe, stable CAD on LHC yesterday, HLD, HTN, and gout who presents for another episode of severe chest pain with new ischemic EKG changes compared to his baseline study.  He is HD stable, chest pain free, and initial troponin is negative.  He will require intensification of his medical therapy, as he was previously deemed to not be a good candidate for revascularization  PLAN/DISCUSSION: 1. Unstable angina: may be related to his baseline severe CAD vs coronary vasospasm  -Will admit to the SDU with telemetry monitoring -Serial EKG, trend trop -Will start on heparin infusion for now -PRN SL NTG and morphine for recurrent chest pain -Continue PTA ASA and plavix -Will start on Imdur in AM -Currently on Zocor, could consider intensifying lipid  therapy with lipitor -Not currently on BB due to hx of AV block  2. HTN -Continue home meds  3. HLD -Continue statin  4. AV fistula -Stable  5. Gout flare -Continue colchicine and tylenol  Pietro Cassis Roby, DO 12:15 AM

## 2017-02-21 NOTE — Progress Notes (Signed)
Patient received discharge instructions with son at bedside. Educated on groin care. Both peripheral IV's removed. Patient and son had no further questions and verbalized understanding. Patient discharged via wheelchair by nurse tech.

## 2017-02-21 NOTE — Progress Notes (Signed)
Patient post-cath through right groin, level 0. Bed rest is up. Ambulated with patient to bathroom. No issues. Groin still level 0. Patient denies any discomfort. Impending discharge.

## 2017-02-22 ENCOUNTER — Other Ambulatory Visit: Payer: Self-pay

## 2017-02-22 DIAGNOSIS — M109 Gout, unspecified: Secondary | ICD-10-CM | POA: Insufficient documentation

## 2017-02-22 LAB — BASIC METABOLIC PANEL
ANION GAP: 7 (ref 5–15)
BUN: 26 mg/dL — ABNORMAL HIGH (ref 6–20)
CO2: 20 mmol/L — ABNORMAL LOW (ref 22–32)
Calcium: 8.4 mg/dL — ABNORMAL LOW (ref 8.9–10.3)
Chloride: 112 mmol/L — ABNORMAL HIGH (ref 101–111)
Creatinine, Ser: 1.07 mg/dL (ref 0.61–1.24)
GFR calc Af Amer: >60 mL/min (ref >60)
Glucose, Bld: 105 mg/dL — ABNORMAL HIGH (ref 65–99)
POTASSIUM: 3.6 mmol/L (ref 3.5–5.1)
SODIUM: 139 mmol/L (ref 135–145)

## 2017-02-22 LAB — HEPARIN LEVEL (UNFRACTIONATED): Heparin Unfractionated: 0.35 IU/mL (ref 0.30–0.70)

## 2017-02-22 LAB — TROPONIN I
TROPONIN I: 0.31 ng/mL — AB (ref <0.03)
Troponin I: 0.27 ng/mL (ref <0.03)
Troponin I: 0.31 ng/mL (ref <0.03)

## 2017-02-22 LAB — CBC
HEMATOCRIT: 34.1 % — AB (ref 39.0–52.0)
HEMOGLOBIN: 11.3 g/dL — AB (ref 13.0–17.0)
MCH: 31.3 pg (ref 26.0–34.0)
MCHC: 33.1 g/dL (ref 30.0–36.0)
MCV: 94.5 fL (ref 78.0–100.0)
PLATELETS: 157 10*3/uL (ref 150–400)
RBC: 3.61 MIL/uL — AB (ref 4.22–5.81)
RDW: 13.5 % (ref 11.5–15.5)
WBC: 7 10*3/uL (ref 4.0–10.5)

## 2017-02-22 LAB — MRSA PCR SCREENING: MRSA BY PCR: NEGATIVE

## 2017-02-22 MED ORDER — NITROGLYCERIN 2 % TD OINT: 1.0000 [in_us] | TOPICAL_OINTMENT | Freq: Three times a day (TID) | TRANSDERMAL | Status: DC

## 2017-02-22 MED ORDER — NITROGLYCERIN IN D5W 200-5 MCG/ML-% IV SOLN
2.0000 ug/min | INTRAVENOUS | Status: DC
  Administered 2017-02-22: 5 ug/min via INTRAVENOUS
  Filled 2017-02-22: qty 250

## 2017-02-22 MED ORDER — RANOLAZINE ER 500 MG PO TB12
500.0000 mg | ORAL_TABLET | Freq: Two times a day (BID) | ORAL | Status: DC
  Administered 2017-02-22 – 2017-02-24 (×5): 500 mg via ORAL
  Filled 2017-02-22 (×5): qty 1

## 2017-02-22 MED ORDER — ISOSORBIDE MONONITRATE ER 30 MG PO TB24: 30.0000 mg | ORAL_TABLET | Freq: Every day | ORAL | Status: DC

## 2017-02-22 MED ORDER — MORPHINE SULFATE (PF) 4 MG/ML IV SOLN
2.0000 mg | INTRAVENOUS | Status: DC | PRN
  Administered 2017-02-22 – 2017-02-27 (×2): 2 mg via INTRAVENOUS
  Filled 2017-02-22 (×2): qty 1

## 2017-02-22 MED ORDER — ISOSORBIDE MONONITRATE ER 30 MG PO TB24
30.0000 mg | ORAL_TABLET | Freq: Every day | ORAL | Status: DC
  Administered 2017-02-22: 30 mg via ORAL
  Filled 2017-02-22: qty 1

## 2017-02-22 NOTE — Progress Notes (Signed)
ANTICOAGULATION CONSULT NOTE - Follow Up Consult  Pharmacy Consult for heparin Indication: chest pain/ACS  No Known Allergies  Patient Measurements: Height: 5\' 6"  (167.6 cm) Weight: 172 lb 13.5 oz (78.4 kg) IBW/kg (Calculated) : 63.8 Heparin Dosing Weight: 78.4  Vital Signs: Temp: 98.3 F (36.8 C) (07/06 0837) Temp Source: Oral (07/06 0837) BP: 129/65 (07/06 0837) Pulse Rate: 52 (07/06 0320)  Labs:  Recent Labs  02/21/17 0204 02/21/17 0940 02/21/17 2128 02/22/17 0211 02/22/17 0707  HGB 11.1*  --  11.7*  --  11.3*  HCT 34.2*  --  35.4*  --  34.1*  PLT 162  --  167  --  157  LABPROT 14.5  --   --   --   --   INR 1.12  --   --   --   --   HEPARINUNFRC 0.52 0.52  --   --  0.35  CREATININE 1.22  --  1.34*  --  1.07  TROPONINI <0.03  --   --  0.27* 0.31*    Estimated Creatinine Clearance: 47.9 mL/min (by C-G formula based on SCr of 1.07 mg/dL).   Medications:  Infusions:  . heparin 850 Units/hr (02/21/17 2348)  . nitroGLYCERIN 5 mcg/min (02/22/17 0602)    Assessment: 83 y/o male s/p heart cath (7/5) readmitted for chest pain/SOB. H/H stable, Scr returned to baseline after mild bump yesterday. Morning heparin level WNL.  Goal of Therapy:  Heparin level 0.3-0.7 units/ml Monitor platelets by anticoagulation protocol: Yes   Plan:  Continue heparin infusion at 850 units/hr Check anti-Xa level daily while on heparin Continue to monitor H&H and platelets  Charlene Brooke, PharmD PGY1 [facility] Resident Pager: [phone]  02/22/2017,9:41 AM

## 2017-02-22 NOTE — Progress Notes (Signed)
Informed Roberts NP with Cardiology about patient having Bradycardia and pause and irregular rhythm changes. Patient remained asymptomatic during all episodes. No new orders. Will continue to monitor.

## 2017-02-22 NOTE — Progress Notes (Addendum)
Progress Note  Patient Name: [name] Date of Encounter: 02/22/2017  Primary Cardiologist: Johnsie Cancel  Subjective   Mild chest pain when ambulating this am. Comfortable at rest.   Inpatient Medications    Scheduled Meds: . amLODipine  10 mg Oral Daily  . aspirin EC  81 mg Oral Daily  . cholecalciferol  1,000 Units Oral Daily  . clopidogrel  75 mg Oral Q breakfast  . colchicine  0.6 mg Oral Daily  . hydrochlorothiazide  12.5 mg Oral Daily  . lisinopril  40 mg Oral Daily  . simvastatin  20 mg Oral Daily   Continuous Infusions: . heparin 850 Units/hr (02/21/17 2348)  . nitroGLYCERIN 5 mcg/min (02/22/17 0602)   PRN Meds: acetaminophen, morphine injection, nitroGLYCERIN, ondansetron (ZOFRAN) IV   Vital Signs    Vitals:   02/21/17 2330 02/22/17 0016 02/22/17 0320 02/22/17 0837  BP: 130/65 (!) 140/58 128/64 129/65  Pulse: (!) 59  (!) 52   Resp: 19  (!) 22 19  Temp:  98.3 F (36.8 C) 98.5 F (36.9 C) 98.3 F (36.8 C)  TempSrc:  Oral Oral Oral  SpO2: 94%  94%   Weight:  172 lb 13.5 oz (78.4 kg)    Height:  5\' 6"  (1.676 m)      Intake/Output Summary (Last 24 hours) at 02/22/17 0935 Last data filed at 02/22/17 0626  Gross per 24 hour  Intake            56.98 ml  Output              350 ml  Net          -293.02 ml   Filed Weights   02/21/17 2104 02/22/17 0016  Weight: 170 lb (77.1 kg) 172 lb 13.5 oz (78.4 kg)    Telemetry    sinus - Personally Reviewed  ECG    Sinus, 1st degree AV block- Personally Reviewed  Physical Exam   GEN: No acute distress.   Neck: No JVD Cardiac: RRR, no murmurs, rubs, or gallops.  Respiratory: Clear to auscultation bilaterally. GI: Soft, nontender, non-distended  Ext: No LE edema; No deformity. Neuro:  Nonfocal  Psych: Normal affect   Labs    Chemistry Recent Labs Lab 02/21/17 0204 02/21/17 2128 02/22/17 0707  NA 144 135 139  K 3.9 3.5 3.6  CL 109 107 112*  CO2 24 18* 20*  GLUCOSE 104* 129* 105*  BUN 35* 31* 26*    CREATININE 1.22 1.34* 1.07  CALCIUM 8.8* 8.5* 8.4*  GFRNONAA 51* 46* >60  GFRAA 60* 53* >60  ANIONGAP 11 10 7      Hematology Recent Labs Lab 02/21/17 0204 02/21/17 2128 02/22/17 0707  WBC 5.6 8.1 7.0  RBC 3.58* 3.74* 3.61*  HGB 11.1* 11.7* 11.3*  HCT 34.2* 35.4* 34.1*  MCV 95.5 94.7 94.5  MCH 31.0 31.3 31.3  MCHC 32.5 33.1 33.1  RDW 13.3 13.3 13.5  PLT 162 167 157    Cardiac Enzymes Recent Labs Lab 02/20/17 1956 02/21/17 0204 02/22/17 0211 02/22/17 0707  TROPONINI <0.03 <0.03 0.27* 0.31*    Recent Labs Lab 02/20/17 0525 02/20/17 0757 02/21/17 2131  TROPIPOC 0.01 0.01 0.04     BNPNo results for input(s): BNP, PROBNP in the last 168 hours.   DDimer No results for input(s): DDIMER in the last 168 hours.   Radiology    Dg Chest 2 View  Result Date: 02/21/2017 CLINICAL DATA:  Acute chest pain. Patient with cardiac catheterization performed  earlier today. EXAM: CHEST  2 VIEW COMPARISON:  02/20/2017 and 11/19/2006 radiographs FINDINGS: Cardiomegaly identified.  The mediastinal silhouette is unchanged. Mild bibasilar scarring and thoracic aortic atherosclerotic calcification again noted. There is no evidence of focal airspace disease, pulmonary edema, suspicious pulmonary nodule/mass, pleural effusion, or pneumothorax. No acute bony abnormalities are identified. IMPRESSION: Cardiomegaly without evidence of acute cardiopulmonary disease. Electronically Signed   By: Margarette Canada M.D.   On: 02/21/2017 21:53    Cardiac Studies   Cardiac cath 02/21/17: Coronary Diagrams   Diagnostic Diagram          Patient Profile     83 y.o. male with history of CAD s/p prior stenting of the RCA, carotid artery disease, HTN, HLD and prior CVA admitted with chest pain/elevated troponin after being discharged earlier yesterday. Cardiac cath 02/21/17 with diffuse severe CAD. Dr. Gwenlyn Found did not feel that there was a change in his coronary anatomy and no PCI was done. The LAD and Ramus  are patent with diffuse disease. The Circumflex is occluded. The distal RCA has moderate disease.   Assessment & Plan    1. CAD with unstable angina/NSTEMI: Pt discharged home 02/21/17 after cardiac cath showed stable diffuse, severe CAD. PCI not felt to be an option. Readmitted later on 02/21/17 with recurrent chest pain.Troponin up to 0.3 this am.  Will attempt medical management of CAD. Will continue ASA, Plavix, statin, Ace-inh, Norvasc. No beta blocker with bradycardia. No plans to repeat cath as this was just done yesterday. Will stop IV NTG and start Imdur. Will add Ranexa.  If we cannot control his pain with medical therapy alone, will have to consider PCI of the ramus intermediate branch next week. This vessel is severely diseased through the ostium and proxima/mid vessel.  I would not want to expose him to another contrast load today. Will leave in stepdown ICU today.    Signed, [name], MD  02/22/2017, 9:35 AM

## 2017-02-22 NOTE — Progress Notes (Signed)
Cardiology Brief Progress Note  Pt has had several additional episodes of chest pain overnight and troponin is now positive.  Will d/c planned Imdur and start on nitro gtt.   Regino Bellow, DO 5:32 AM

## 2017-02-22 NOTE — Progress Notes (Signed)
Patient c/o chest pain 4/10. Nitrol SL given and effective. Dr. Overton Mam notified and ordered Morphine 2 mg Q 3 hrs PRN.

## 2017-02-22 NOTE — Progress Notes (Signed)
Patient c/o having 6/10 chest pain, Morphine 2mg  given, and effective. Within 5 minutes central telemetry to report 9 beats of V-tach. Dr.Roby notified and ordered Nitro drip. Will start and continue to monitor closely and obtain another 12 lead EKG.

## 2017-02-22 NOTE — Progress Notes (Signed)
CRITICAL VALUE ALERT  Critical Value:  Troponin 0.27  Date & Time Notied:  02/22/2017 0326  Provider Notified: Dr.Roby  Orders Received/Actions taken: Continue to monitor

## 2017-02-23 LAB — CBC
HCT: 33.1 % — ABNORMAL LOW (ref 39.0–52.0)
Hemoglobin: 10.7 g/dL — ABNORMAL LOW (ref 13.0–17.0)
MCH: 30.9 pg (ref 26.0–34.0)
MCHC: 32.3 g/dL (ref 30.0–36.0)
MCV: 95.7 fL (ref 78.0–100.0)
PLATELETS: 161 10*3/uL (ref 150–400)
RBC: 3.46 MIL/uL — ABNORMAL LOW (ref 4.22–5.81)
RDW: 13.7 % (ref 11.5–15.5)
WBC: 5.5 10*3/uL (ref 4.0–10.5)

## 2017-02-23 LAB — BASIC METABOLIC PANEL
Anion gap: 7 (ref 5–15)
BUN: 29 mg/dL — AB (ref 6–20)
CO2: 21 mmol/L — ABNORMAL LOW (ref 22–32)
Calcium: 8.3 mg/dL — ABNORMAL LOW (ref 8.9–10.3)
Chloride: 111 mmol/L (ref 101–111)
Creatinine, Ser: 1.2 mg/dL (ref 0.61–1.24)
GFR calc Af Amer: >60 mL/min (ref >60)
GFR, EST NON AFRICAN AMERICAN: 53 mL/min — AB (ref >60)
GLUCOSE: 95 mg/dL (ref 65–99)
POTASSIUM: 3.8 mmol/L (ref 3.5–5.1)
Sodium: 139 mmol/L (ref 135–145)

## 2017-02-23 LAB — HEPARIN LEVEL (UNFRACTIONATED): Heparin Unfractionated: 0.45 IU/mL (ref 0.30–0.70)

## 2017-02-23 MED ORDER — ISOSORBIDE MONONITRATE ER 60 MG PO TB24
60.0000 mg | ORAL_TABLET | Freq: Every day | ORAL | Status: DC
  Administered 2017-02-23 – 2017-02-24 (×2): 60 mg via ORAL
  Filled 2017-02-23 (×2): qty 1

## 2017-02-23 MED ORDER — SODIUM CHLORIDE 0.9% FLUSH
3.0000 mL | Freq: Two times a day (BID) | INTRAVENOUS | Status: DC
  Administered 2017-02-23 – 2017-02-27 (×8): 3 mL via INTRAVENOUS

## 2017-02-23 MED ORDER — SODIUM CHLORIDE 0.9% FLUSH
10.0000 mL | Freq: Two times a day (BID) | INTRAVENOUS | Status: DC
  Administered 2017-02-23 – 2017-02-26 (×4): 10 mL via INTRAVENOUS

## 2017-02-23 MED ORDER — SODIUM CHLORIDE 0.9% FLUSH
10.0000 mL | INTRAVENOUS | Status: DC | PRN
Start: 2017-02-23 — End: 2017-02-27

## 2017-02-23 NOTE — Progress Notes (Signed)
Patient ambulated with contact guard assist for steadying purposes approximately 250 feet in hall.  Tol well with no evidence of SOB or chest pain.  As per MD, heparin gtt.discontinued.  Report called to nurse receiving patient into room 3W11.

## 2017-02-23 NOTE — Progress Notes (Signed)
ANTICOAGULATION CONSULT NOTE - Follow Up Consult  Pharmacy Consult for Heparin Indication: chest pain/ACS  No Known Allergies  Patient Measurements: Height: 5\' 6"  (167.6 cm) Weight: 168 lb 3.4 oz (76.3 kg) IBW/kg (Calculated) : 63.8  Vital Signs: Temp: 98.1 F (36.7 C) (07/07 0755) Temp Source: Oral (07/07 0755) BP: 122/61 (07/07 0755) Pulse Rate: 51 (07/07 0755)  Labs:  Recent Labs  02/21/17 0204 02/21/17 0940 02/21/17 2128 02/22/17 0211 02/22/17 0707 02/22/17 1119 02/23/17 0412  HGB 11.1*  --  11.7*  --  11.3*  --  10.7*  HCT 34.2*  --  35.4*  --  34.1*  --  33.1*  PLT 162  --  167  --  157  --  161  LABPROT 14.5  --   --   --   --   --   --   INR 1.12  --   --   --   --   --   --   HEPARINUNFRC 0.52 0.52  --   --  0.35  --  0.45  CREATININE 1.22  --  1.34*  --  1.07  --  1.20  TROPONINI <0.03  --   --  0.27* 0.31* 0.31*  --     Estimated Creatinine Clearance: 39.1 mL/min (by C-G formula based on SCr of 1.2 mg/dL).   Medications:  Heparin @ 850 units/hr  Assessment: 89yom continues on heparin for recurrent chest pain s/p cath on 7/5. Heparin level is therapeutic at 0.45. CBC stable. No bleeding.   Goal of Therapy:  Heparin level 0.3-0.7 units/ml Monitor platelets by anticoagulation protocol: Yes   Plan:  1) Continue heparin at 850 units/hr 2) Daily heparin level and CBC  Deboraha Sprang 02/23/2017,10:46 AM

## 2017-02-23 NOTE — Progress Notes (Signed)
   02/23/17 2324  ECG Monitoring  ECG Heart Rate (!) 55  Ectopy Sinus pause (2.03 seconds; Indiana Gamero, RN notified)  Pt had 2.03 seconds pause, pt resting in bed asleep. Dr. Eula Fried made aware. No new orders made. Will continue to monitor.

## 2017-02-23 NOTE — Progress Notes (Signed)
Progress Note  Patient Name: [name] Date of Encounter: 02/23/2017  Primary Cardiologist: Johnsie Cancel  Subjective   Continued chest pain with exertion. Feeling well at rest.  Inpatient Medications    Scheduled Meds: . amLODipine  10 mg Oral Daily  . aspirin EC  81 mg Oral Daily  . cholecalciferol  1,000 Units Oral Daily  . clopidogrel  75 mg Oral Q breakfast  . colchicine  0.6 mg Oral Daily  . hydrochlorothiazide  12.5 mg Oral Daily  . isosorbide mononitrate  30 mg Oral Daily  . lisinopril  40 mg Oral Daily  . ranolazine  500 mg Oral BID  . simvastatin  20 mg Oral Daily   Continuous Infusions: . heparin 850 Units/hr (02/23/17 0212)   PRN Meds: acetaminophen, morphine injection, nitroGLYCERIN, ondansetron (ZOFRAN) IV   Vital Signs    Vitals:   02/23/17 0421 02/23/17 0500 02/23/17 0600 02/23/17 0755  BP:  112/61 (!) 123/106 122/61  Pulse:  (!) 46 (!) 45 (!) 51  Resp:  17 16 20   Temp: 97.7 F (36.5 C)   98.1 F (36.7 C)  TempSrc: Oral   Oral  SpO2:  98% 98% 96%  Weight: 168 lb 3.4 oz (76.3 kg)     Height:        Intake/Output Summary (Last 24 hours) at 02/23/17 2683 Last data filed at 02/22/17 2053  Gross per 24 hour  Intake             76.5 ml  Output              801 ml  Net           -724.5 ml   Filed Weights   02/21/17 2104 02/22/17 0016 02/23/17 0421  Weight: 170 lb (77.1 kg) 172 lb 13.5 oz (78.4 kg) 168 lb 3.4 oz (76.3 kg)    Telemetry    Sinus rhythm with mobitz 1 AV block - Personally Reviewed  ECG    Sinus rhythm, 1 degree AV block- Personally Reviewed  Physical Exam   GEN: Well nourished, well developed, in no acute distress  HEENT: normal  Neck: no JVD, carotid bruits, or masses Cardiac: RRR; no murmurs, rubs, or gallops,no edema  Respiratory:  clear to auscultation bilaterally, normal work of breathing GI: soft, nontender, nondistended, + BS MS: no deformity or atrophy  Skin: warm and dry Neuro:  Strength and sensation are  intact Psych: euthymic mood, full affect   Labs    Chemistry  Recent Labs Lab 02/21/17 2128 02/22/17 0707 02/23/17 0412  NA 135 139 139  K 3.5 3.6 3.8  CL 107 112* 111  CO2 18* 20* 21*  GLUCOSE 129* 105* 95  BUN 31* 26* 29*  CREATININE 1.34* 1.07 1.20  CALCIUM 8.5* 8.4* 8.3*  GFRNONAA 46* >60 53*  GFRAA 53* >60 >60  ANIONGAP 10 7 7      Hematology  Recent Labs Lab 02/21/17 2128 02/22/17 0707 02/23/17 0412  WBC 8.1 7.0 5.5  RBC 3.74* 3.61* 3.46*  HGB 11.7* 11.3* 10.7*  HCT 35.4* 34.1* 33.1*  MCV 94.7 94.5 95.7  MCH 31.3 31.3 30.9  MCHC 33.1 33.1 32.3  RDW 13.3 13.5 13.7  PLT 167 157 161    Cardiac Enzymes  Recent Labs Lab 02/21/17 0204 02/22/17 0211 02/22/17 0707 02/22/17 1119  TROPONINI <0.03 0.27* 0.31* 0.31*     Recent Labs Lab 02/20/17 0525 02/20/17 0757 02/21/17 2131  TROPIPOC 0.01 0.01 0.04  BNPNo results for input(s): BNP, PROBNP in the last 168 hours.   DDimer No results for input(s): DDIMER in the last 168 hours.   Radiology    Dg Chest 2 View  Result Date: 02/21/2017 CLINICAL DATA:  Acute chest pain. Patient with cardiac catheterization performed earlier today. EXAM: CHEST  2 VIEW COMPARISON:  02/20/2017 and 11/19/2006 radiographs FINDINGS: Cardiomegaly identified.  The mediastinal silhouette is unchanged. Mild bibasilar scarring and thoracic aortic atherosclerotic calcification again noted. There is no evidence of focal airspace disease, pulmonary edema, suspicious pulmonary nodule/mass, pleural effusion, or pneumothorax. No acute bony abnormalities are identified. IMPRESSION: Cardiomegaly without evidence of acute cardiopulmonary disease. Electronically Signed   By: Margarette Canada M.D.   On: 02/21/2017 21:53    Cardiac Studies   Cardiac cath 02/21/17: Coronary Diagrams   Diagnostic Diagram          Patient Profile     83 y.o. male with history of CAD s/p prior stenting of the RCA, carotid artery disease, HTN, HLD and prior  CVA admitted with chest pain/elevated troponin after being discharged earlier yesterday. Cardiac cath 02/21/17 with diffuse severe CAD. Dr. Gwenlyn Found did not feel that there was a change in his coronary anatomy and no PCI was done. The LAD and Ramus are patent with diffuse disease. The Circumflex is occluded. The distal RCA has moderate disease.   Assessment & Plan    1. CAD with unstable angina/NSTEMI: Pt discharged home 02/21/17 after cardiac cath showed stable diffuse, severe CAD. PCI not felt to be an option. Readmitted later on 02/21/17 with recurrent chest pain.Troponin up to 0.3 this am.  Janani Chamber attempt medical management of CAD. On optimal therapy for his coronary disease with aspirin, Plavix, indoor, and Ranexa. Indoor at low dose, and Lindy Pennisi increase as he is continuing to have pain with exertion. We'll transfer him to the floor today and have him walk around to see if he continues to have pain. The pain improves, may be able to stop his heparin drip today.  2. Hypertension: Currently on lisinopril, amlodipine. Has been well-controlled.  3. Hyperlipidemia: Continue Zocor  Signed, Baylynn Shifflett Meredith Leeds, MD  02/23/2017, 9:22 AM

## 2017-02-24 LAB — COMPREHENSIVE METABOLIC PANEL
ALT: 129 U/L — AB (ref 17–63)
AST: 121 U/L — AB (ref 15–41)
Albumin: 3.3 g/dL — ABNORMAL LOW (ref 3.5–5.0)
Alkaline Phosphatase: 50 U/L (ref 38–126)
Anion gap: 8 (ref 5–15)
BUN: 28 mg/dL — AB (ref 6–20)
CHLORIDE: 110 mmol/L (ref 101–111)
CO2: 20 mmol/L — AB (ref 22–32)
CREATININE: 1.4 mg/dL — AB (ref 0.61–1.24)
Calcium: 8.6 mg/dL — ABNORMAL LOW (ref 8.9–10.3)
GFR calc Af Amer: 51 mL/min — ABNORMAL LOW (ref >60)
GFR, EST NON AFRICAN AMERICAN: 44 mL/min — AB (ref >60)
Glucose, Bld: 133 mg/dL — ABNORMAL HIGH (ref 65–99)
Potassium: 4.1 mmol/L (ref 3.5–5.1)
SODIUM: 138 mmol/L (ref 135–145)
Total Bilirubin: 0.6 mg/dL (ref 0.3–1.2)
Total Protein: 5.7 g/dL — ABNORMAL LOW (ref 6.5–8.1)

## 2017-02-24 LAB — MAGNESIUM: Magnesium: 1.8 mg/dL (ref 1.7–2.4)

## 2017-02-24 MED ORDER — ISOSORBIDE MONONITRATE ER 60 MG PO TB24
90.0000 mg | ORAL_TABLET | Freq: Every day | ORAL | Status: DC
  Administered 2017-02-25 – 2017-02-28 (×4): 90 mg via ORAL
  Filled 2017-02-24 (×4): qty 1

## 2017-02-24 MED ORDER — RANOLAZINE ER 500 MG PO TB12
1000.0000 mg | ORAL_TABLET | Freq: Two times a day (BID) | ORAL | Status: DC
  Administered 2017-02-24 – 2017-02-28 (×8): 1000 mg via ORAL
  Filled 2017-02-24 (×8): qty 2

## 2017-02-24 NOTE — Progress Notes (Signed)
Pt had 9 beats of V[place] and alothough asymptomatic, he stated he felt a "fluttering" in his chest. I paged MD on call and he ordered CMET

## 2017-02-24 NOTE — Progress Notes (Signed)
Pt had 2.11 second pause. He was asymptomatic. Blood pressure WNL. Will notify MD on rounds.

## 2017-02-24 NOTE — Progress Notes (Signed)
Progress Note  Patient Name: [name] Date of Encounter: 02/24/2017  Primary Cardiologist: Johnsie Cancel  Subjective   Mild chest pain with exertion today. No pain at rest.  Inpatient Medications    Scheduled Meds: . amLODipine  10 mg Oral Daily  . aspirin EC  81 mg Oral Daily  . cholecalciferol  1,000 Units Oral Daily  . clopidogrel  75 mg Oral Q breakfast  . colchicine  0.6 mg Oral Daily  . hydrochlorothiazide  12.5 mg Oral Daily  . isosorbide mononitrate  60 mg Oral Daily  . lisinopril  40 mg Oral Daily  . ranolazine  500 mg Oral BID  . simvastatin  20 mg Oral Daily  . sodium chloride flush  10 mL Intravenous Q12H  . sodium chloride flush  3 mL Intravenous Q12H   Continuous Infusions:  PRN Meds: acetaminophen, morphine injection, nitroGLYCERIN, ondansetron (ZOFRAN) IV, sodium chloride flush   Vital Signs    Vitals:   02/23/17 2007 02/24/17 0527 02/24/17 0936 02/24/17 1030  BP: (!) 124/59 131/62 (!) 147/73 132/61  Pulse: 61 (!) 53    Resp: 20 17    Temp: 97.7 F (36.5 C) 97.9 F (36.6 C)    TempSrc: Oral Oral    SpO2: 96% 97%    Weight:  168 lb (76.2 kg)    Height:        Intake/Output Summary (Last 24 hours) at 02/24/17 1242 Last data filed at 02/24/17 0145  Gross per 24 hour  Intake              240 ml  Output                0 ml  Net              240 ml   Filed Weights   02/22/17 0016 02/23/17 0421 02/24/17 0527  Weight: 172 lb 13.5 oz (78.4 kg) 168 lb 3.4 oz (76.3 kg) 168 lb (76.2 kg)    Telemetry    Sinus rhythm with mobitz I AV block - Personally Reviewed  ECG    Sinus rhythm, 1 degree AV block- Personally Reviewed  Physical Exam   GEN: Well nourished, well developed, in no acute distress  HEENT: normal  Neck: no JVD, carotid bruits, or masses Cardiac: RRR; no murmurs, rubs, or gallops,no edema  Respiratory:  clear to auscultation bilaterally, normal work of breathing GI: soft, nontender, nondistended, + BS MS: no deformity or atrophy    Skin: warm and dry Neuro:  Strength and sensation are intact Psych: euthymic mood, full affect   Labs    Chemistry  Recent Labs Lab 02/21/17 2128 02/22/17 0707 02/23/17 0412  NA 135 139 139  K 3.5 3.6 3.8  CL 107 112* 111  CO2 18* 20* 21*  GLUCOSE 129* 105* 95  BUN 31* 26* 29*  CREATININE 1.34* 1.07 1.20  CALCIUM 8.5* 8.4* 8.3*  GFRNONAA 46* >60 53*  GFRAA 53* >60 >60  ANIONGAP 10 7 7      Hematology  Recent Labs Lab 02/21/17 2128 02/22/17 0707 02/23/17 0412  WBC 8.1 7.0 5.5  RBC 3.74* 3.61* 3.46*  HGB 11.7* 11.3* 10.7*  HCT 35.4* 34.1* 33.1*  MCV 94.7 94.5 95.7  MCH 31.3 31.3 30.9  MCHC 33.1 33.1 32.3  RDW 13.3 13.5 13.7  PLT 167 157 161    Cardiac Enzymes  Recent Labs Lab 02/21/17 0204 02/22/17 0211 02/22/17 0707 02/22/17 1119  TROPONINI <0.03 0.27* 0.31* 0.31*  Recent Labs Lab 02/20/17 0525 02/20/17 0757 02/21/17 2131  TROPIPOC 0.01 0.01 0.04     BNPNo results for input(s): BNP, PROBNP in the last 168 hours.   DDimer No results for input(s): DDIMER in the last 168 hours.   Radiology    No results found.  Cardiac Studies   Cardiac cath 02/21/17: Coronary Diagrams   Diagnostic Diagram          Patient Profile     83 y.o. male with history of CAD s/p prior stenting of the RCA, carotid artery disease, HTN, HLD and prior CVA admitted with chest pain/elevated troponin after being discharged earlier yesterday. Cardiac cath 02/21/17 with diffuse severe CAD. Dr. Gwenlyn Found did not feel that there was a change in his coronary anatomy and no PCI was done. The LAD and Ramus are patent with diffuse disease. The Circumflex is occluded. The distal RCA has moderate disease.   Assessment & Plan    1. CAD with unstable angina/NSTEMI: Represented with CAD and NSTEMI, managed medically. Heparin stopped yesterday. Contining to have pain today. [name] increase imdur and ranexa today. If pain free may be able to be discharged tomorrow. It is possible  that he [name] have stable angina with ambulation in the future and can take SL NTG PRN.  2. Hypertension: well controlled today  3. Hyperlipidemia: continue zodor  4. Mobitz I AV block: seen on telemetry and has had bradycardia this admission. Does not appear to be symptomatic. Continue to monitor and avoid AV nodal blocking agents.  Signed, Henretta Quist Meredith Leeds, MD  02/24/2017, 12:42 PM

## 2017-02-25 ENCOUNTER — Other Ambulatory Visit (HOSPITAL_COMMUNITY): Payer: Medicare Other

## 2017-02-25 ENCOUNTER — Inpatient Hospital Stay (HOSPITAL_COMMUNITY): Payer: Medicare Other

## 2017-02-25 DIAGNOSIS — I25110 Atherosclerotic heart disease of native coronary artery with unstable angina pectoris: Principal | ICD-10-CM

## 2017-02-25 DIAGNOSIS — I10 Essential (primary) hypertension: Secondary | ICD-10-CM

## 2017-02-25 DIAGNOSIS — R7989 Other specified abnormal findings of blood chemistry: Secondary | ICD-10-CM

## 2017-02-25 DIAGNOSIS — E7800 Pure hypercholesterolemia, unspecified: Secondary | ICD-10-CM

## 2017-02-25 MED ORDER — SIMVASTATIN 20 MG PO TABS: 20.0000 mg | ORAL_TABLET | Freq: Every day | ORAL | Status: DC

## 2017-02-25 MED ORDER — AMLODIPINE BESYLATE 10 MG PO TABS
10.0000 mg | ORAL_TABLET | Freq: Every day | ORAL | Status: DC
  Administered 2017-02-25 – 2017-02-27 (×3): 10 mg via ORAL
  Filled 2017-02-25 (×3): qty 1

## 2017-02-25 NOTE — Progress Notes (Signed)
Progress Note  Patient Name: [name] Date of Encounter: 02/25/2017  Primary Cardiologist: Dr. Johnsie Cancel   Subjective   Mild chest discomfort with standing today. Resolved with rest. No dyspnea. ? Has abdominal pain.   Inpatient Medications    Scheduled Meds: . amLODipine  10 mg Oral QHS  . aspirin EC  81 mg Oral Daily  . cholecalciferol  1,000 Units Oral Daily  . clopidogrel  75 mg Oral Q breakfast  . colchicine  0.6 mg Oral Daily  . hydrochlorothiazide  12.5 mg Oral Daily  . isosorbide mononitrate  90 mg Oral Daily  . lisinopril  40 mg Oral Daily  . ranolazine  1,000 mg Oral BID  . simvastatin  20 mg Oral QHS  . sodium chloride flush  10 mL Intravenous Q12H  . sodium chloride flush  3 mL Intravenous Q12H   Continuous Infusions:  PRN Meds: acetaminophen, morphine injection, nitroGLYCERIN, ondansetron (ZOFRAN) IV, sodium chloride flush   Vital Signs    Vitals:   02/24/17 1030 02/24/17 1454 02/24/17 2100 02/25/17 0351  BP: 132/61 (!) 105/49 119/61 128/64  Pulse:   (!) 59 (!) 58  Resp:   16 16  Temp:  97.8 F (36.6 C) 98.1 F (36.7 C) 98.4 F (36.9 C)  TempSrc:  Oral Oral Oral  SpO2:   100% 97%  Weight:    168 lb 8 oz (76.4 kg)  Height:        Intake/Output Summary (Last 24 hours) at 02/25/17 0857 Last data filed at 02/25/17 0836  Gross per 24 hour  Intake                0 ml  Output              750 ml  Net             -750 ml   Filed Weights   02/23/17 0421 02/24/17 0527 02/25/17 0351  Weight: 168 lb 3.4 oz (76.3 kg) 168 lb (76.2 kg) 168 lb 8 oz (76.4 kg)    Telemetry    Sinus rhythm with Mobitz 1 AV block. Longest pause 2.24 sec. Multiple NSVT (3 to 9 beats) - Personally Reviewed  ECG    None today   Physical Exam   GEN: No acute distress.   Neck: No JVD Cardiac: RRR, no murmurs, rubs, or gallops.  Respiratory: Clear to auscultation bilaterally. GI: Soft, nontender, non-distended  MS: No edema; No deformity. Neuro:  Nonfocal  Psych:  Normal affect   Labs    Chemistry Recent Labs Lab 02/22/17 0707 02/23/17 0412 02/24/17 1916  NA 139 139 138  K 3.6 3.8 4.1  CL 112* 111 110  CO2 20* 21* 20*  GLUCOSE 105* 95 133*  BUN 26* 29* 28*  CREATININE 1.07 1.20 1.40*  CALCIUM 8.4* 8.3* 8.6*  PROT  --   --  5.7*  ALBUMIN  --   --  3.3*  AST  --   --  121*  ALT  --   --  129*  ALKPHOS  --   --  50  BILITOT  --   --  0.6  GFRNONAA >60 53* 44*  GFRAA >60 >60 51*  ANIONGAP 7 7 8      Hematology Recent Labs Lab 02/21/17 2128 02/22/17 0707 02/23/17 0412  WBC 8.1 7.0 5.5  RBC 3.74* 3.61* 3.46*  HGB 11.7* 11.3* 10.7*  HCT 35.4* 34.1* 33.1*  MCV 94.7 94.5 95.7  MCH 31.3 31.3  30.9  MCHC 33.1 33.1 32.3  RDW 13.3 13.5 13.7  PLT 167 157 161    Cardiac Enzymes Recent Labs Lab 02/21/17 0204 02/22/17 0211 02/22/17 0707 02/22/17 1119  TROPONINI <0.03 0.27* 0.31* 0.31*    Recent Labs Lab 02/20/17 0525 02/20/17 0757 02/21/17 2131  TROPIPOC 0.01 0.01 0.04     BNPNo results for input(s): BNP, PROBNP in the last 168 hours.   DDimer No results for input(s): DDIMER in the last 168 hours.   Radiology    No results found.  Cardiac Studies   Cardiac cath 02/21/17: IMPRESSION: Mr. Kuzniar had a widely patent RCA stent placed one year ago. He does have a moderate distal RCA lesion just before the takeoff of the PDA which is unchanged from his prior cath. He has an occluded circumflex which is nondominant and high-grade ostial, proximal and mid ramus branch stenosis as well as scattered noncritical LAD disease. He does have an apical LAD occlusion which is old as well. I do not think that his anatomy is clinically changed from a year ago. I recommend continued medical therapy. A right femoral angiogram was performed and a MYNX closure device was successfully deployed achieving hemostasis. The patient can be discharged home later today with medical therapy. Coronary Diagrams   Diagnostic Diagram           Patient Profile     83 y.o. male with history of CAD s/p prior stenting of the RCA, carotid artery disease, HTN, HLD and prior CVA presented with recurrent  chest pain/elevated troponin after being discharged earlier same day on 7/5.   Cardiac cath 02/21/17 with diffuse severe CAD. Dr. Gwenlyn Found did not feel that there was a change in his coronary anatomy and no PCI was done. The LAD and Ramus are patent with diffuse disease. The Circumflex is occluded. The distal RCA has moderate disease.   Assessment & Plan    1. CAD with unstable angina/NSTEMI:  - Readmitted for recurrent chest pain. Started Imdur and Renexa this admission. Troponin trend  0.27-->0.31-->0.31. Up titrated Imdur and Renexa yesterday. However had chest pain with standing up this morning. Now resovled.  - Per Dr. Milus Glazier note 7/6  If we cannot control his pain with medical therapy alone, will have to consider PCI of the ramus intermediate branch next week. This vessel is severely diseased through the ostium and proxima/mid vessel. - Plan per MD. Continue current therapy.   2. .Mobitz I AV block - Not not seem symptomatic. Avoid AV blocking agent.   3. NSVT - K and Mg stable. Unable to add BB due to bradycardia.   4. Elevated LFTs - Hold Statin. ? His symptoms is due to GI etiology.  Will check CMP tomorrow.   5. HLD - 02/21/2017: Cholesterol 109; HDL 46; LDL Cholesterol 53; Triglycerides 52; VLDL 10  - Hold statin as above  6. HTN - Stable  7.AKI - follow renal function closely with medication adjustment  Signed, Enis Leatherwood, PA  02/25/2017, 8:57 AM

## 2017-02-25 NOTE — Care Management Important Message (Signed)
Important Message  Patient Details  Name: [name] MRN: [id number] Date of Birth: Apr 27, 1928   Medicare Important Message Given:  Yes    Husayn Reim 02/25/2017, 2:50 PM

## 2017-02-26 DIAGNOSIS — I209 Angina pectoris, unspecified: Secondary | ICD-10-CM

## 2017-02-26 DIAGNOSIS — I440 Atrioventricular block, first degree: Secondary | ICD-10-CM

## 2017-02-26 LAB — COMPREHENSIVE METABOLIC PANEL
ALK PHOS: 52 U/L (ref 38–126)
ALT: 193 U/L — ABNORMAL HIGH (ref 17–63)
ANION GAP: 8 (ref 5–15)
AST: 147 U/L — ABNORMAL HIGH (ref 15–41)
Albumin: 3.3 g/dL — ABNORMAL LOW (ref 3.5–5.0)
BILIRUBIN TOTAL: 0.6 mg/dL (ref 0.3–1.2)
BUN: 26 mg/dL — ABNORMAL HIGH (ref 6–20)
CALCIUM: 8.7 mg/dL — AB (ref 8.9–10.3)
CO2: 22 mmol/L (ref 22–32)
Chloride: 109 mmol/L (ref 101–111)
Creatinine, Ser: 1.24 mg/dL (ref 0.61–1.24)
GFR, EST AFRICAN AMERICAN: 58 mL/min — AB (ref >60)
GFR, EST NON AFRICAN AMERICAN: 50 mL/min — AB (ref >60)
Glucose, Bld: 114 mg/dL — ABNORMAL HIGH (ref 65–99)
POTASSIUM: 3.5 mmol/L (ref 3.5–5.1)
Sodium: 139 mmol/L (ref 135–145)
TOTAL PROTEIN: 6 g/dL — AB (ref 6.5–8.1)

## 2017-02-26 MED ORDER — ALUM & MAG HYDROXIDE-SIMETH 200-200-20 MG/5ML PO SUSP
15.0000 mL | Freq: Four times a day (QID) | ORAL | Status: DC | PRN
  Administered 2017-02-26: 15 mL via ORAL
  Filled 2017-02-26: qty 30

## 2017-02-26 NOTE — Care Management Note (Signed)
Case Management Note  Patient Details  Name: Kano Heckmann MRN: [id number] Date of Birth: October 09, 1927  Subjective/Objective: Pt presented for Unstable Angina. Prior stenting of RCA 02-21-17. Plan for Cath 02-26-17. PTA pt-independent from home with wife. Son at the bedside.                     Action/Plan: No home needs identified at this time.   Expected Discharge Date:                  Expected Discharge Plan:     In-House Referral:  NA  Discharge planning Services  CM Consult  Post Acute Care Choice:  NA Choice offered to:  NA  DME Arranged:  N/A DME Agency:  NA  HH Arranged:  NA HH Agency:  NA  Status of Service:  Completed, signed off  If discussed at [place] of Stay Meetings, dates discussed:  02-26-17  Additional Comments:  Bethena Roys, RN 02/26/2017, 12:41 PM

## 2017-02-26 NOTE — Progress Notes (Addendum)
Progress Note  Patient Name: [name] Date of Encounter: 02/26/2017  Primary Cardiologist: Johnsie Cancel  Subjective   Had another episode of epigastric pain this morning while walking to the bathroom.  He pretty much notes this likely anginal: Epigastric/substernal discomfort with minimal activity. It is associated with shortness of breath.  Inpatient Medications    Scheduled Meds: . amLODipine  10 mg Oral QHS  . aspirin EC  81 mg Oral Daily  . cholecalciferol  1,000 Units Oral Daily  . clopidogrel  75 mg Oral Q breakfast  . colchicine  0.6 mg Oral Daily  . hydrochlorothiazide  12.5 mg Oral Daily  . isosorbide mononitrate  90 mg Oral Daily  . lisinopril  40 mg Oral Daily  . ranolazine  1,000 mg Oral BID  . sodium chloride flush  10 mL Intravenous Q12H  . sodium chloride flush  3 mL Intravenous Q12H   Continuous Infusions:  PRN Meds: acetaminophen, alum & mag hydroxide-simeth, morphine injection, nitroGLYCERIN, ondansetron (ZOFRAN) IV, sodium chloride flush   Vital Signs    Vitals:   02/26/17 0300 02/26/17 0454 02/26/17 0900 02/26/17 1320  BP:  136/69 123/70 120/74  Pulse:  (!) 59 (!) 58 (!) 56  Resp:  16  18  Temp:  97.9 F (36.6 C) (!) 97.5 F (36.4 C) 98 F (36.7 C)  TempSrc:  Oral Oral Oral  SpO2:  97% 96% 97%  Weight: 167 lb 4.8 oz (75.9 kg)     Height:        Intake/Output Summary (Last 24 hours) at 02/26/17 1403 Last data filed at 02/26/17 0900  Gross per 24 hour  Intake                0 ml  Output              400 ml  Net             -400 ml   Filed Weights   02/24/17 0527 02/25/17 0351 02/26/17 0300  Weight: 168 lb (76.2 kg) 168 lb 8 oz (76.4 kg) 167 lb 4.8 oz (75.9 kg)    Telemetry    SR with PVCs, NSVT - Personally Reviewed  ECG    N/A - Personally Reviewed  Physical Exam   General: Well developed, well nourished, male appearing in no acute distress. Head: Normocephalic, atraumatic.  Neck: Supple without bruits, JVD. Lungs:  Resp  regular and unlabored, CTA. Heart: RRR, S1, S2, no S3, S4, 1-2/6 SEM; no rub. Abdomen: Soft, non-tender, non-distended with normoactive bowel sounds. No hepatomegaly. No rebound/guarding. No obvious abdominal masses. Extremities: No clubbing, cyanosis, edema. Distal pedal pulses are 2+ bilaterally. Right radial AVF, thrill/bruit noted. Neuro: Alert and oriented X 3. Moves all extremities spontaneously. Psych: Normal affect.  Labs    Chemistry Recent Labs Lab 02/23/17 0412 02/24/17 1916 02/26/17 0252  NA 139 138 139  K 3.8 4.1 3.5  CL 111 110 109  CO2 21* 20* 22  GLUCOSE 95 133* 114*  BUN 29* 28* 26*  CREATININE 1.20 1.40* 1.24  CALCIUM 8.3* 8.6* 8.7*  PROT  --  5.7* 6.0*  ALBUMIN  --  3.3* 3.3*  AST  --  121* 147*  ALT  --  129* 193*  ALKPHOS  --  50 52  BILITOT  --  0.6 0.6  GFRNONAA 53* 44* 50*  GFRAA >60 51* 58*  ANIONGAP 7 8 8      Hematology Recent Labs Lab 02/21/17 2128 02/22/17 4656  02/23/17 0412  WBC 8.1 7.0 5.5  RBC 3.74* 3.61* 3.46*  HGB 11.7* 11.3* 10.7*  HCT 35.4* 34.1* 33.1*  MCV 94.7 94.5 95.7  MCH 31.3 31.3 30.9  MCHC 33.1 33.1 32.3  RDW 13.3 13.5 13.7  PLT 167 157 161    Cardiac Enzymes Recent Labs Lab 02/21/17 0204 02/22/17 0211 02/22/17 0707 02/22/17 1119  TROPONINI <0.03 0.27* 0.31* 0.31*    Recent Labs Lab 02/20/17 0525 02/20/17 0757 02/21/17 2131  TROPIPOC 0.01 0.01 0.04     BNPNo results for input(s): BNP, PROBNP in the last 168 hours.   DDimer No results for input(s): DDIMER in the last 168 hours.    Radiology    US Abdomen Complete  Result Date: 02/25/2017 CLINICAL DATA:  Abdominal pain EXAM: ABDOMEN ULTRASOUND COMPLETE COMPARISON:  None. FINDINGS: Gallbladder: No gallstones or gallbladder wall thickening. No pericholecystic fluid. The sonographer reports no sonographic Murphy's sign. Common bile duct: Diameter: 5 mm Liver: No focal lesion identified. Coarsened echogenicity with poor acoustic through transmission  suggests fatty deposition. IVC: No abnormality visualized. Pancreas: Obscured by bowel gas. Spleen: Size and appearance within normal limits. Right Kidney: Length: 9.9 cm. Echogenicity within normal limits. No mass or hydronephrosis visualized. Left Kidney: Length: 10.9 cm. 4 mm nonobstructing interpolar stone. 18 mm cyst identified upper pole. 15 mm cyst identified in the interpolar region. Abdominal aorta: No aneurysm visualized. Other findings: None. IMPRESSION: 1. No acute findings in the abdomen. 2. 4 mm nonobstructing stone interpolar left kidney. 3. Small left renal cysts. Electronically Signed   By: Misty Stanley M.D.   On: 02/25/2017 20:50    Cardiac Studies   Cardiac cath 02/21/17: IMPRESSION:Mr. Quam had a widely patent RCA stent placed one year ago. He does have a moderate distal RCA lesion just before the takeoff of the PDA which is unchanged from his prior cath. He has an occluded circumflex which is nondominant and high-grade ostial, proximal and mid ramus branch stenosis as well as scattered noncritical LAD disease. He does have an apical LAD occlusion which is old as well. I do not think that his anatomy is clinically changed from a year ago. I recommend continued medical therapy. A right femoral angiogram was performed and a MYNX closure device was successfully deployed achieving hemostasis. The patient can be discharged home later today with medical therapy.   Patient Profile     83 y.o. male with history of CAD s/p prior stenting of the RCA, carotid artery disease, HTN, HLD and prior CVA presented with recurrent  chest pain/elevated troponin after being discharged earlier same day on 7/5.   Cardiac cath 02/21/17 with diffuse severe CAD. Dr. Gwenlyn Found did not feel that there was a change in his coronary anatomy and no PCI was done. The LAD and Ramus are patent with diffuse disease. The Circumflex is occluded. The distal RCA has moderate disease.   He was admitted the day after his cath  with recurrent pain and mild troponin elevation. Plan about a week and was to titrate medications for medical management, he has now failed medical management with persistent recurrent exertional angina (class 3-4).  On Monday, July 9, Dr. Fransico Him ordered an abdominal ultrasound to evaluate elevated LFTs in the setting of his epigastric discomfort. This was shown to be relatively normal.  Assessment & Plan    1. CAD with unstable angina/NSTEMI: Readmitted for recurrent chest pain. Started Imdur and Renexa this admission. Troponin trend  0.27-->0.31-->0.31. Up titrated Imdur and Renexa. Had  another episode of epigastric/chest pain this morning.  - Per Dr. Milus Glazier note 7/6 If we cannot control his pain with medical therapy alone, will have to consider PCI of the ramus intermediate branch next week. This vessel is severely diseased through the ostium and proxima/mid vessel. - remains NPO at this time. Dr. Ellyn Hack to follow up with plans for possible PCI tomorrow..   2. .Mobitz I AV block: - He does not seem symptomatic. Avoid AV blocking agent.   3. NSVT:  - K and Mg stable. Unable to add BB due to bradycardia.   4. Elevated LFTs: - Hold Statin. Unsure of whether his symptoms are due to GI etiology.  LFTs still trending up today. ABD [place] negative.   5. HLD: - 02/21/2017: Cholesterol 109; HDL 46; LDL Cholesterol 53; Triglycerides 52; VLDL 10  - Hold statin as above  6. HTN: - Stable - consider stopping HCTZ given his age, if blood pressure is stable otherwise.  7.AKI: now improved - follow renal function closely with medication adjustment  Signed, Reino Bellis, NP  02/26/2017, 2:03 PM    I have seen, examined and evaluated the patient this PM along with Reino Bellis, NP-C.  After reviewing all the available data and chart, we discussed the patients laboratory, study & physical findings as well as symptoms in detail. I agree with her findings, examination as well as  impression recommendations as per our discussion.    Attending adjustments noted in italics.   Unfortunately I did not see the patient until today. I reviewed his catheterization films and met with the patient directly. I discussed his symptoms and I'm concerned that the symptoms he is having quite likely is exertional angina at least class III if not class IV. As such, I feel that he is likely failing medical management being on amlodipine and high-dose as well as Imdur and Ranexa. We cannot use a beta blocker because of his baseline bradycardia.  He is already on aspirin and Plavix for his previous stent.  Discussed options with the patient, but feel that potentially in invasive approach with rotational atherectomy based PCI of the ramus intermedius is probably the best option. I did explain to him that this is a slightly higher risk than standard PCI, but this would be the only viable PCI target. The LAD has diffuse disease, but no obvious culprit.  The patient understands his options and is agreeable to proceed with PCI with hopes that this will make him feel better.  Plan: Rotablator Guided PCI of Ostial-mid LAD tomorrow with Dr. Claiborne Billings (I reviewed the images initially with Dr. Burt Knack who agreed that this is a viable option, I then discussed the images with Dr. Claiborne Billings later on this evening who has agreed to perform the procedure tomorrow)   Consent Discussion Performing MD:  Shelva Majestic, M.D.  The procedure with Risks/Benefits/Alternatives and Indications was reviewed with the patient .  All questions were answered.    Risks / Complications include, but not limited to: Death, MI, CVA/TIA, VF/VT (with defibrillation), Bradycardia (need for temporary pacer placement), contrast induced nephropathy, bleeding / bruising / hematoma / pseudoaneurysm, vascular or coronary injury (with possible emergent CT or Vascular Surgery), adverse medication reactions, infection.  Additional risks involving the  use of radiation with the possibility of radiation burns and cancer were explained in detail. I explained the additional risk for rotational atherectomy with need for possible temporal pacemaker placement.  The patient voice understanding and agree to proceed.  Glenetta Hew, M.D., M.S. Interventional Cardiologist   Pager # [phone] Phone # [phone] [address] [place], [place] [address]

## 2017-02-27 ENCOUNTER — Inpatient Hospital Stay (HOSPITAL_COMMUNITY)
Admission: EM | Disposition: A | Discharge status: Home / Self Care | Payer: Self-pay | Source: Home / Self Care | Attending: Internal Medicine

## 2017-02-27 HISTORY — PX: CORONARY ATHERECTOMY: CATH118238

## 2017-02-27 LAB — COMPREHENSIVE METABOLIC PANEL
ALBUMIN: 3.4 g/dL — AB (ref 3.5–5.0)
ALK PHOS: 51 U/L (ref 38–126)
ALT: 178 U/L — AB (ref 17–63)
AST: 103 U/L — ABNORMAL HIGH (ref 15–41)
Anion gap: 9 (ref 5–15)
BUN: 28 mg/dL — ABNORMAL HIGH (ref 6–20)
CALCIUM: 8.9 mg/dL (ref 8.9–10.3)
CHLORIDE: 108 mmol/L (ref 101–111)
CO2: 20 mmol/L — AB (ref 22–32)
CREATININE: 1.44 mg/dL — AB (ref 0.61–1.24)
GFR calc non Af Amer: 42 mL/min — ABNORMAL LOW (ref >60)
GFR, EST AFRICAN AMERICAN: 49 mL/min — AB (ref >60)
GLUCOSE: 99 mg/dL (ref 65–99)
Potassium: 4.2 mmol/L (ref 3.5–5.1)
SODIUM: 137 mmol/L (ref 135–145)
Total Bilirubin: 0.5 mg/dL (ref 0.3–1.2)
Total Protein: 5.9 g/dL — ABNORMAL LOW (ref 6.5–8.1)

## 2017-02-27 LAB — CBC
HCT: 34.9 % — ABNORMAL LOW (ref 39.0–52.0)
HEMOGLOBIN: 11.5 g/dL — AB (ref 13.0–17.0)
MCH: 31.3 pg (ref 26.0–34.0)
MCHC: 33 g/dL (ref 30.0–36.0)
MCV: 95.1 fL (ref 78.0–100.0)
Platelets: 185 10*3/uL (ref 150–400)
RBC: 3.67 MIL/uL — AB (ref 4.22–5.81)
RDW: 13.7 % (ref 11.5–15.5)
WBC: 5.8 10*3/uL (ref 4.0–10.5)

## 2017-02-27 LAB — POCT ACTIVATED CLOTTING TIME: ACTIVATED CLOTTING TIME: 643 s

## 2017-02-27 SURGERY — CORONARY ATHERECTOMY
Anesthesia: LOCAL

## 2017-02-27 MED ORDER — LABETALOL HCL 5 MG/ML IV SOLN: 10.0000 mg | INTRAVENOUS | Status: DC | PRN

## 2017-02-27 MED ORDER — SODIUM CHLORIDE 0.9 % IV SOLN: 250.0000 mL | INTRAVENOUS | Status: DC | PRN

## 2017-02-27 MED ORDER — FENTANYL CITRATE (PF) 100 MCG/2ML IJ SOLN
INTRAMUSCULAR | Status: DC | PRN
  Administered 2017-02-27 (×2): 25 ug via INTRAVENOUS

## 2017-02-27 MED ORDER — LIDOCAINE HCL 1 % IJ SOLN
INTRAMUSCULAR | Status: AC
  Filled 2017-02-27: qty 20

## 2017-02-27 MED ORDER — SODIUM CHLORIDE 0.9% FLUSH: 3.0000 mL | Freq: Two times a day (BID) | INTRAVENOUS | Status: DC

## 2017-02-27 MED ORDER — CLOPIDOGREL BISULFATE 75 MG PO TABS
75.0000 mg | ORAL_TABLET | Freq: Every day | ORAL | Status: DC
  Administered 2017-02-28: 75 mg via ORAL
  Filled 2017-02-27: qty 1

## 2017-02-27 MED ORDER — NITROGLYCERIN IN D5W 200-5 MCG/ML-% IV SOLN
INTRAVENOUS | Status: AC
  Filled 2017-02-27: qty 250

## 2017-02-27 MED ORDER — SODIUM CHLORIDE 0.9% FLUSH: 3.0000 mL | INTRAVENOUS | Status: DC | PRN

## 2017-02-27 MED ORDER — CLOPIDOGREL BISULFATE 75 MG PO TABS
ORAL_TABLET | ORAL | Status: AC
  Filled 2017-02-27: qty 2

## 2017-02-27 MED ORDER — HEPARIN (PORCINE) IN NACL 2-0.9 UNIT/ML-% IJ SOLN
INTRAMUSCULAR | Status: AC
  Filled 2017-02-27: qty 500

## 2017-02-27 MED ORDER — SODIUM CHLORIDE 0.9 % IV SOLN
INTRAVENOUS | Status: DC
  Administered 2017-02-27 (×2): via INTRAVENOUS

## 2017-02-27 MED ORDER — SODIUM CHLORIDE 0.9 % WEIGHT BASED INFUSION
1.0000 mL/kg/h | INTRAVENOUS | Status: DC
  Administered 2017-02-27: 1 mL/kg/h via INTRAVENOUS

## 2017-02-27 MED ORDER — BIVALIRUDIN TRIFLUOROACETATE 250 MG IV SOLR
INTRAVENOUS | Status: AC
  Filled 2017-02-27: qty 250

## 2017-02-27 MED ORDER — HEART ATTACK BOUNCING BOOK
Freq: Once | Status: DC
  Filled 2017-02-27: qty 1

## 2017-02-27 MED ORDER — HEPARIN SODIUM (PORCINE) 1000 UNIT/ML IJ SOLN
INTRAMUSCULAR | Status: AC
  Filled 2017-02-27: qty 1

## 2017-02-27 MED ORDER — SODIUM CHLORIDE 0.9 % WEIGHT BASED INFUSION
3.0000 mL/kg/h | INTRAVENOUS | Status: DC
  Administered 2017-02-27: 3 mL/kg/h via INTRAVENOUS

## 2017-02-27 MED ORDER — ATORVASTATIN CALCIUM 80 MG PO TABS
80.0000 mg | ORAL_TABLET | Freq: Every day | ORAL | Status: DC
  Administered 2017-02-27: 80 mg via ORAL
  Filled 2017-02-27: qty 1

## 2017-02-27 MED ORDER — VERAPAMIL HCL 2.5 MG/ML IV SOLN
INTRA_ARTERIAL | Status: DC | PRN
  Administered 2017-02-27: 10:00:00 via INTRACORONARY

## 2017-02-27 MED ORDER — ASPIRIN EC 81 MG PO TBEC: 81.0000 mg | DELAYED_RELEASE_TABLET | Freq: Every day | ORAL | Status: DC

## 2017-02-27 MED ORDER — IOPAMIDOL (ISOVUE-370) INJECTION 76%
INTRAVENOUS | Status: AC
  Filled 2017-02-27: qty 125

## 2017-02-27 MED ORDER — LIDOCAINE HCL (PF) 1 % IJ SOLN
INTRAMUSCULAR | Status: DC | PRN
  Administered 2017-02-27: 20 mL

## 2017-02-27 MED ORDER — IOPAMIDOL (ISOVUE-370) INJECTION 76%
INTRAVENOUS | Status: DC | PRN
  Administered 2017-02-27: 290 mL via INTRA_ARTERIAL

## 2017-02-27 MED ORDER — IOPAMIDOL (ISOVUE-370) INJECTION 76%
INTRAVENOUS | Status: AC
  Filled 2017-02-27: qty 100

## 2017-02-27 MED ORDER — ACETAMINOPHEN 325 MG PO TABS: 650.0000 mg | ORAL_TABLET | ORAL | Status: DC | PRN

## 2017-02-27 MED ORDER — ASPIRIN 81 MG PO CHEW
81.0000 mg | CHEWABLE_TABLET | ORAL | Status: AC
  Administered 2017-02-27: 81 mg via ORAL
  Filled 2017-02-27: qty 1

## 2017-02-27 MED ORDER — MIDAZOLAM HCL 2 MG/2ML IJ SOLN
INTRAMUSCULAR | Status: DC | PRN
  Administered 2017-02-27 (×2): 1 mg via INTRAVENOUS

## 2017-02-27 MED ORDER — DIAZEPAM 5 MG PO TABS: 5.0000 mg | ORAL_TABLET | Freq: Four times a day (QID) | ORAL | Status: DC | PRN

## 2017-02-27 MED ORDER — ASPIRIN 81 MG PO CHEW
81.0000 mg | CHEWABLE_TABLET | Freq: Every day | ORAL | Status: DC
  Administered 2017-02-28: 10:00:00 81 mg via ORAL
  Filled 2017-02-27: qty 1

## 2017-02-27 MED ORDER — NOREPINEPHRINE BITARTRATE 1 MG/ML IV SOLN
INTRAVENOUS | Status: AC
  Filled 2017-02-27: qty 4

## 2017-02-27 MED ORDER — MIDAZOLAM HCL 2 MG/2ML IJ SOLN
INTRAMUSCULAR | Status: AC
  Filled 2017-02-27: qty 2

## 2017-02-27 MED ORDER — FENTANYL CITRATE (PF) 100 MCG/2ML IJ SOLN
INTRAMUSCULAR | Status: AC
  Filled 2017-02-27: qty 2

## 2017-02-27 MED ORDER — NITROGLYCERIN 1 MG/10 ML FOR IR/CATH LAB
INTRA_ARTERIAL | Status: DC | PRN
  Administered 2017-02-27: 200 ug via INTRACORONARY
  Administered 2017-02-27: 100 ug via INTRACORONARY

## 2017-02-27 MED ORDER — NOREPINEPHRINE BITARTRATE 1 MG/ML IV SOLN
INTRAVENOUS | Status: DC | PRN
  Administered 2017-02-27: 5 ug/min via INTRAVENOUS

## 2017-02-27 MED ORDER — HEPARIN (PORCINE) IN NACL 2-0.9 UNIT/ML-% IJ SOLN
INTRAMUSCULAR | Status: AC | PRN
  Administered 2017-02-27: 1500 mL

## 2017-02-27 MED ORDER — HYDRALAZINE HCL 20 MG/ML IJ SOLN: 5.0000 mg | INTRAMUSCULAR | Status: DC | PRN

## 2017-02-27 MED ORDER — SODIUM CHLORIDE 0.9 % IV SOLN
INTRAVENOUS | Status: DC | PRN
  Administered 2017-02-27 (×2): 1.75 mg/kg/h via INTRAVENOUS

## 2017-02-27 MED ORDER — VERAPAMIL HCL 2.5 MG/ML IV SOLN
INTRAVENOUS | Status: AC
  Filled 2017-02-27: qty 4

## 2017-02-27 MED ORDER — ONDANSETRON HCL 4 MG/2ML IJ SOLN: 4.0000 mg | Freq: Four times a day (QID) | INTRAMUSCULAR | Status: DC | PRN

## 2017-02-27 MED ORDER — BIVALIRUDIN BOLUS VIA INFUSION - CUPID
INTRAVENOUS | Status: DC | PRN
  Administered 2017-02-27: 56.325 mg via INTRAVENOUS

## 2017-02-27 MED ORDER — CLOPIDOGREL BISULFATE 75 MG PO TABS
ORAL_TABLET | ORAL | Status: DC | PRN
  Administered 2017-02-27: 150 mg via ORAL

## 2017-02-27 MED ORDER — SODIUM CHLORIDE 0.9 % IV SOLN
INTRAVENOUS | Status: AC | PRN
  Administered 2017-02-27: 50 mL/h via INTRAVENOUS
  Administered 2017-02-27: 10 mL/h via INTRAVENOUS

## 2017-02-27 MED ORDER — ANGIOPLASTY BOOK
Freq: Once | Status: AC
  Administered 2017-02-27: 22:00:00
  Filled 2017-02-27: qty 1

## 2017-02-27 SURGICAL SUPPLY — 22 items
BALLN WOLVERINE 3.00X10 (BALLOONS) ×3
BALLN ~~LOC~~ EMERGE MR 3.25X20 (BALLOONS) ×3
BALLOON WOLVERINE 3.00X10 (BALLOONS) IMPLANT
BALLOON ~~LOC~~ EMERGE MR 3.25X20 (BALLOONS) IMPLANT
BURR ROTALINK 1.75MM (BURR) ×1 IMPLANT
CABLE ADAPT CONN TEMP 6FT (ADAPTER) ×1 IMPLANT
CATH ROTALINK PLUS 1.50MM (BURR) ×1 IMPLANT
CATH S G BIP PACING (SET/KITS/TRAYS/PACK) ×1 IMPLANT
CATH VISTA GUIDE 7FR XB4 (CATHETERS) ×1 IMPLANT
KIT ENCORE 26 ADVANTAGE (KITS) ×1 IMPLANT
KIT HEART LEFT (KITS) ×3 IMPLANT
LUBRICANT ROTAGLIDE 20CC VIAL (MISCELLANEOUS) ×1 IMPLANT
PACK CARDIAC CATHETERIZATION (CUSTOM PROCEDURE TRAY) ×3 IMPLANT
SHEATH PINNACLE 6F 10CM (SHEATH) ×1 IMPLANT
SHEATH PINNACLE 7F 10CM (SHEATH) ×1 IMPLANT
STENT SYNERGY DES 3X24 (Permanent Stent) ×1 IMPLANT
STENT SYNERGY DES 3X32 (Permanent Stent) ×1 IMPLANT
TRANSDUCER W/STOPCOCK (MISCELLANEOUS) ×3 IMPLANT
TUBING CIL FLEX 10 FLL-RA (TUBING) ×3 IMPLANT
WIRE EMERALD 3MM-J .035X150CM (WIRE) ×1 IMPLANT
WIRE MARVEL STR TIP 190CM (WIRE) ×1 IMPLANT
WIRE ROTA FLOPPY .009X325CM (WIRE) ×1 IMPLANT

## 2017-02-27 NOTE — H&P (View-Only) (Signed)
Progress Note  Patient Name: [name] Date of Encounter: 02/26/2017  Primary Cardiologist: Johnsie Cancel  Subjective   Had another episode of epigastric pain this morning while walking to the bathroom.  He pretty much notes this likely anginal: Epigastric/substernal discomfort with minimal activity. It is associated with shortness of breath.  Inpatient Medications    Scheduled Meds: . amLODipine  10 mg Oral QHS  . aspirin EC  81 mg Oral Daily  . cholecalciferol  1,000 Units Oral Daily  . clopidogrel  75 mg Oral Q breakfast  . colchicine  0.6 mg Oral Daily  . hydrochlorothiazide  12.5 mg Oral Daily  . isosorbide mononitrate  90 mg Oral Daily  . lisinopril  40 mg Oral Daily  . ranolazine  1,000 mg Oral BID  . sodium chloride flush  10 mL Intravenous Q12H  . sodium chloride flush  3 mL Intravenous Q12H   Continuous Infusions:  PRN Meds: acetaminophen, alum & mag hydroxide-simeth, morphine injection, nitroGLYCERIN, ondansetron (ZOFRAN) IV, sodium chloride flush   Vital Signs    Vitals:   02/26/17 0300 02/26/17 0454 02/26/17 0900 02/26/17 1320  BP:  136/69 123/70 120/74  Pulse:  (!) 59 (!) 58 (!) 56  Resp:  16  18  Temp:  97.9 F (36.6 C) (!) 97.5 F (36.4 C) 98 F (36.7 C)  TempSrc:  Oral Oral Oral  SpO2:  97% 96% 97%  Weight: 167 lb 4.8 oz (75.9 kg)     Height:        Intake/Output Summary (Last 24 hours) at 02/26/17 1403 Last data filed at 02/26/17 0900  Gross per 24 hour  Intake                0 ml  Output              400 ml  Net             -400 ml   Filed Weights   02/24/17 0527 02/25/17 0351 02/26/17 0300  Weight: 168 lb (76.2 kg) 168 lb 8 oz (76.4 kg) 167 lb 4.8 oz (75.9 kg)    Telemetry    SR with PVCs, NSVT - Personally Reviewed  ECG    N/A - Personally Reviewed  Physical Exam   General: Well developed, well nourished, male appearing in no acute distress. Head: Normocephalic, atraumatic.  Neck: Supple without bruits, JVD. Lungs:  Resp  regular and unlabored, CTA. Heart: RRR, S1, S2, no S3, S4, or murmur; no rub. Abdomen: Soft, non-tender, non-distended with normoactive bowel sounds. No hepatomegaly. No rebound/guarding. No obvious abdominal masses. Extremities: No clubbing, cyanosis, edema. Distal pedal pulses are 2+ bilaterally. Right radial AVF, thrill/bruit noted. Neuro: Alert and oriented X 3. Moves all extremities spontaneously. Psych: Normal affect.  Labs    Chemistry Recent Labs Lab 02/23/17 0412 02/24/17 1916 02/26/17 0252  NA 139 138 139  K 3.8 4.1 3.5  CL 111 110 109  CO2 21* 20* 22  GLUCOSE 95 133* 114*  BUN 29* 28* 26*  CREATININE 1.20 1.40* 1.24  CALCIUM 8.3* 8.6* 8.7*  PROT  --  5.7* 6.0*  ALBUMIN  --  3.3* 3.3*  AST  --  121* 147*  ALT  --  129* 193*  ALKPHOS  --  50 52  BILITOT  --  0.6 0.6  GFRNONAA 53* 44* 50*  GFRAA >60 51* 58*  ANIONGAP 7 8 8      Hematology Recent Labs Lab 02/21/17 2128 02/22/17 4680  02/23/17 0412  WBC 8.1 7.0 5.5  RBC 3.74* 3.61* 3.46*  HGB 11.7* 11.3* 10.7*  HCT 35.4* 34.1* 33.1*  MCV 94.7 94.5 95.7  MCH 31.3 31.3 30.9  MCHC 33.1 33.1 32.3  RDW 13.3 13.5 13.7  PLT 167 157 161    Cardiac Enzymes Recent Labs Lab 02/21/17 0204 02/22/17 0211 02/22/17 0707 02/22/17 1119  TROPONINI <0.03 0.27* 0.31* 0.31*    Recent Labs Lab 02/20/17 0525 02/20/17 0757 02/21/17 2131  TROPIPOC 0.01 0.01 0.04     BNPNo results for input(s): BNP, PROBNP in the last 168 hours.   DDimer No results for input(s): DDIMER in the last 168 hours.    Radiology    US Abdomen Complete  Result Date: 02/25/2017 CLINICAL DATA:  Abdominal pain EXAM: ABDOMEN ULTRASOUND COMPLETE COMPARISON:  None. FINDINGS: Gallbladder: No gallstones or gallbladder wall thickening. No pericholecystic fluid. The sonographer reports no sonographic Murphy's sign. Common bile duct: Diameter: 5 mm Liver: No focal lesion identified. Coarsened echogenicity with poor acoustic through transmission  suggests fatty deposition. IVC: No abnormality visualized. Pancreas: Obscured by bowel gas. Spleen: Size and appearance within normal limits. Right Kidney: Length: 9.9 cm. Echogenicity within normal limits. No mass or hydronephrosis visualized. Left Kidney: Length: 10.9 cm. 4 mm nonobstructing interpolar stone. 18 mm cyst identified upper pole. 15 mm cyst identified in the interpolar region. Abdominal aorta: No aneurysm visualized. Other findings: None. IMPRESSION: 1. No acute findings in the abdomen. 2. 4 mm nonobstructing stone interpolar left kidney. 3. Small left renal cysts. Electronically Signed   By: Misty Stanley M.D.   On: 02/25/2017 20:50    Cardiac Studies   Cardiac cath 02/21/17: IMPRESSION:Mr. Dietrick had a widely patent RCA stent placed one year ago. He does have a moderate distal RCA lesion just before the takeoff of the PDA which is unchanged from his prior cath. He has an occluded circumflex which is nondominant and high-grade ostial, proximal and mid ramus branch stenosis as well as scattered noncritical LAD disease. He does have an apical LAD occlusion which is old as well. I do not think that his anatomy is clinically changed from a year ago. I recommend continued medical therapy. A right femoral angiogram was performed and a MYNX closure device was successfully deployed achieving hemostasis. The patient can be discharged home later today with medical therapy.   Patient Profile     83 y.o. male with history of CAD s/p prior stenting of the RCA, carotid artery disease, HTN, HLD and prior CVA presented with recurrent  chest pain/elevated troponin after being discharged earlier same day on 7/5.   Cardiac cath 02/21/17 with diffuse severe CAD. Dr. Gwenlyn Found did not feel that there was a change in his coronary anatomy and no PCI was done. The LAD and Ramus are patent with diffuse disease. The Circumflex is occluded. The distal RCA has moderate disease.   He was admitted the day after his cath  with recurrent pain and mild troponin elevation. Plan about a week and was to titrate medications for medical management, he has now failed medical management with persistent recurrent exertional angina (class 3-4).  On Monday, July 9, Dr. Fransico Him ordered an abdominal ultrasound to evaluate elevated LFTs in the setting of his epigastric discomfort. This was shown to be relatively normal.  Assessment & Plan    1. CAD with unstable angina/NSTEMI: Readmitted for recurrent chest pain. Started Imdur and Renexa this admission. Troponin trend  0.27-->0.31-->0.31. Up titrated Imdur and Renexa. Had  another episode of epigastric/chest pain this morning.  - Per Dr. Milus Glazier note 7/6 If we cannot control his pain with medical therapy alone, will have to consider PCI of the ramus intermediate branch next week. This vessel is severely diseased through the ostium and proxima/mid vessel. - remains NPO at this time. Dr. Ellyn Hack to follow up with plans for possible PCI tomorrow..   2. .Mobitz I AV block: - He does not seem symptomatic. Avoid AV blocking agent.   3. NSVT:  - K and Mg stable. Unable to add BB due to bradycardia.   4. Elevated LFTs: - Hold Statin. Unsure of whether his symptoms are due to GI etiology.  LFTs still trending up today. ABD [place] negative.   5. HLD: - 02/21/2017: Cholesterol 109; HDL 46; LDL Cholesterol 53; Triglycerides 52; VLDL 10  - Hold statin as above  6. HTN: - Stable - consider stopping HCTZ given his age, if blood pressure is stable otherwise.  7.AKI: now improved - follow renal function closely with medication adjustment  Signed, Reino Bellis, NP  02/26/2017, 2:03 PM    I have seen, examined and evaluated the patient this PM along with Reino Bellis, NP-C.  After reviewing all the available data and chart, we discussed the patients laboratory, study & physical findings as well as symptoms in detail. I agree with her findings, examination as well as  impression recommendations as per our discussion.    Attending adjustments noted in italics.   Unfortunately I did not see the patient until today. I reviewed his catheterization films and met with the patient directly. I discussed his symptoms and I'm concerned that the symptoms he is having quite likely is exertional angina at least class III if not class IV. As such, I feel that he is likely failing medical management being on amlodipine and high-dose as well as Imdur and Ranexa. We cannot use a beta blocker because of his baseline bradycardia.  He is already on aspirin and Plavix for his previous stent.  Discussed options with the patient, but feel that potentially in invasive approach with rotational atherectomy based PCI of the ramus intermedius is probably the best option. I did explain to him that this is a slightly higher risk than standard PCI, but this would be the only viable PCI target. The LAD has diffuse disease, but no obvious culprit.  The patient understands his options and is agreeable to proceed with PCI with hopes that this will make him feel better.  Plan: Rotablator Guided PCI of Ostial-mid LAD tomorrow with Dr. Claiborne Billings (I reviewed the images initially with Dr. Burt Knack who agreed that this is a viable option, I then discussed the images with Dr. Claiborne Billings later on this evening who has agreed to perform the procedure tomorrow)   Consent Discussion Performing MD:  Shelva Majestic, M.D.  The procedure with Risks/Benefits/Alternatives and Indications was reviewed with the patient .  All questions were answered.    Risks / Complications include, but not limited to: Death, MI, CVA/TIA, VF/VT (with defibrillation), Bradycardia (need for temporary pacer placement), contrast induced nephropathy, bleeding / bruising / hematoma / pseudoaneurysm, vascular or coronary injury (with possible emergent CT or Vascular Surgery), adverse medication reactions, infection.  Additional risks involving the  use of radiation with the possibility of radiation burns and cancer were explained in detail. I explained the additional risk for rotational atherectomy with need for possible temporal pacemaker placement.  The patient voice understanding and agree to proceed.  Glenetta Hew, M.D., M.S. Interventional Cardiologist   Pager # [phone] Phone # [phone] [address] [place], [place] [address]

## 2017-02-27 NOTE — Plan of Care (Signed)
Problem: Cardiovascular: Goal: Ability to achieve and maintain adequate cardiovascular perfusion will improve Outcome: Progressing Pt denies any CP or SOB. VSS throughout shift. Continue to monitor.

## 2017-02-27 NOTE — Discharge Summary (Signed)
Discharge Summary    Patient ID: [name],  MRN: [id number], DOB/AGE: 12/13/1927 83 y.o.  Admit date: 02/21/2017 Discharge date: 02/28/2017  Primary Care Provider: Helane Rima Primary Cardiologist: Johnsie Cancel  Discharge Diagnoses    Principal Problem:   Unstable angina [facility]) Active Problems:   Essential hypertension   Second degree atrioventricular block, Mobitz type I   Coronary artery disease involving coronary bypass graft of native heart with angina pectoris ([place])   HLD (hyperlipidemia)   Presence of drug coated stent in RCA & Ramus Intermedius.   Allergies No Known Allergies  Diagnostic Studies/Procedures    LHC: 02/27/17  Conclusion     Prox RCA to Mid RCA lesion, 0 %stenosed.  Mid RCA lesion, 30 %stenosed.  Dist RCA lesion, 70 %stenosed.  Ost Cx to Prox Cx lesion, 100 %stenosed.  Dist LAD lesion, 100 %stenosed.  Mid LAD lesion, 50 %stenosed.  Ost LAD to Prox LAD lesion, 60 %stenosed.  Ramus-2 lesion, 70 %stenosed.  A STENT SYNERGY DES 3X32 drug eluting stent was successfully placed.  Ramus-1 lesion, 80 %stenosed.  Post intervention, there is a 0% residual stenosis.  A STENT SYNERGY DES 3X24 drug eluting stent was successfully placed, and overlaps previously placed stent.  Ost Ramus lesion, 95 %stenosed.  Post intervention, there is a 0% residual stenosis.   Successful complex PCI to the ramus intermediate vessel which had a 95% ostial stenosis followed by diffuse 80% proximal stenosis and 70% mid stenosis.  The three lesion were treated with high-speed rotational atherectomy with a 1.5 mm and a 1.75 mm burr,  cutting balloon atherotomy with a 3.010 mm Wolverine balloon, and tandem DES stenting with a 3.032 mm and 3.024 mm Synergy DES stents postdilated to 3.28 m millimeters ostially to 3.21 mm distally with the entire segments being reduced to 0%.   RECOMMENDATION: Lifelong DAPT.  Continue aggressive medical therapy for the patient's  severe concomitant CAD.   _____________   History of Present Illness     [name] is an 83 yo male with a pmhx of CAD s/p DES to RCA complicated by radial AV fistula, carotid stenosis s/pCEA, HTN, HLD, and CVA who presents for evaluation of chest pain.  Pt was discharged 02/21/17 following an admission for unstable angina.  Troponins were negative and he underwent LHC which revealed stable severe CAD (mod dRCA, occluded LCx, high grade ostial, prox and mid ramus stenosis as well as scattered noncritical LAD disease and apical LAD occlusion) and a patent RCA stent.  He was discharged on medical therapy.    Pt stated that following his discharge he was seen by his PCP for what appeared to be a gout flare in his left wrist.  He was started on colchicine and tylenol.  He subsequently went home, where a short time later he was going up his stairs in his tri-level home when he had acute onset of midsternal chest pain described as an 8/10 pressure which did not radiate and did not resolve after 20 minutes.  Denies SOB, palpitations, or dizziness a/w the symptoms. He subsequently called EMS who administered 2 SL NTG en route, which improved his pain.  Initial EKG by EMS showed significant inferolateral ST depression with ST elevation in aVR, which was a significant change compared to admission EKG on 02/20/17.  Upon evaluation in the ED, his chest pain had essentially resolved, however ST changes were still evident on EKG.  Initial troponin was negative.  Cardiology asked to evaluate for  unstable angina.   At the time of exam, his only complaint was of some pain around his left wrist.  He denies CP, SOB, palpitaitons, LE edema, fevers, chills, or dizziness.  He is not currently on BB due to hx of AV block in the past (predominantly 1st degree, but has hx of intermittent 2nd degree). He was admitted and started on IV heparin with the addition of Imdur.   Hospital Course     Trop cycled and peaked at 0.31. He was  transferred to telemetry and attempted to ambulate but continued to have episodes of epigastric/substernal chest pain. Imdur and Ranexa were added this admission and titrated. LFTs were noted to be elevated this admission, and statin was held with improvement. Had an [place] abd that was negative for acute findings. His cath films were reviewed by Dr. Ellyn Hack and colleagues who felt that PCI to the LAD was to be considered. He underwent LHC with successful rotablator guided PCI of the osital-mid LAD with Dr. Claiborne Billings on 02/27/17. Post cath labs showed Cr 1.32 and Hgb 10.9. He reported feeling much improved post cath. Plan for DAPT with ASA/plavix. Ambulated with cardiac rehab without any further anginal or abd pain. Given his improvement in LFTs he was resumed on home Zocor dose at discharge.   He was seen by Dr. Ellyn Hack and determined stable for discharge home. Follow up in the office has been arranged. Medications are listed below.  _____________  Discharge Vitals Blood pressure (!) 141/61, pulse (!) 59, temperature 98 F (36.7 C), temperature source Oral, resp. rate (!) 21, height 5\' 6"  (1.676 m), weight 167 lb 8.8 oz (76 kg), SpO2 94 %.  Filed Weights   02/26/17 0300 02/27/17 0543 02/28/17 0248  Weight: 167 lb 4.8 oz (75.9 kg) 165 lb 9.6 oz (75.1 kg) 167 lb 8.8 oz (76 kg)    Labs & Radiologic Studies    CBC  Recent Labs  02/27/17 0332 02/28/17 0256  WBC 5.8 5.2  HGB 11.5* 10.9*  HCT 34.9* 33.7*  MCV 95.1 94.9  PLT 185 517   Basic Metabolic Panel  Recent Labs  02/27/17 0311 02/28/17 0256  NA 137 138  K 4.2 4.2  CL 108 112*  CO2 20* 18*  GLUCOSE 99 107*  BUN 28* 23*  CREATININE 1.44* 1.32*  CALCIUM 8.9 8.0*   Liver Function Tests  Recent Labs  02/27/17 0311 02/28/17 0256  AST 103* 75*  ALT 178* 142*  ALKPHOS 51 52  BILITOT 0.5 0.5  PROT 5.9* 5.5*  ALBUMIN 3.4* 3.1*   No results for input(s): LIPASE, AMYLASE in the last 72 hours. Cardiac Enzymes No results for  input(s): CKTOTAL, CKMB, CKMBINDEX, TROPONINI in the last 72 hours. BNP Invalid input(s): POCBNP D-Dimer No results for input(s): DDIMER in the last 72 hours. Hemoglobin A1C No results for input(s): HGBA1C in the last 72 hours. Fasting Lipid Panel No results for input(s): CHOL, HDL, LDLCALC, TRIG, CHOLHDL, LDLDIRECT in the last 72 hours. Thyroid Function Tests No results for input(s): TSH, T4TOTAL, T3FREE, THYROIDAB in the last 72 hours.  Invalid input(s): FREET3 _____________  Dg Chest 2 View  Result Date: 02/21/2017 CLINICAL DATA:  Acute chest pain. Patient with cardiac catheterization performed earlier today. EXAM: CHEST  2 VIEW COMPARISON:  02/20/2017 and 11/19/2006 radiographs FINDINGS: Cardiomegaly identified.  The mediastinal silhouette is unchanged. Mild bibasilar scarring and thoracic aortic atherosclerotic calcification again noted. There is no evidence of focal airspace disease, pulmonary edema, suspicious pulmonary nodule/mass, pleural effusion,  or pneumothorax. No acute bony abnormalities are identified. IMPRESSION: Cardiomegaly without evidence of acute cardiopulmonary disease. Electronically Signed   By: Margarette Canada M.D.   On: 02/21/2017 21:53   Dg Chest 2 View  Result Date: 02/20/2017 CLINICAL DATA:  Chest pain EXAM: CHEST  2 VIEW COMPARISON:  Chest radiograph 11/19/2006 FINDINGS: Unchanged cardiomegaly with atherosclerotic calcification of the aortic arch. Unchanged calcified granuloma in the left upper lobe. Bibasilar atelectasis without other focal consolidation. No pulmonary edema. No pneumothorax or pleural effusion. IMPRESSION: Cardiomegaly and bibasilar atelectasis without acute abnormality. Electronically Signed   By: Ulyses Jarred M.D.   On: 02/20/2017 06:21   US Abdomen Complete  Result Date: 02/25/2017 CLINICAL DATA:  Abdominal pain EXAM: ABDOMEN ULTRASOUND COMPLETE COMPARISON:  None. FINDINGS: Gallbladder: No gallstones or gallbladder wall thickening. No  pericholecystic fluid. The sonographer reports no sonographic Murphy's sign. Common bile duct: Diameter: 5 mm Liver: No focal lesion identified. Coarsened echogenicity with poor acoustic through transmission suggests fatty deposition. IVC: No abnormality visualized. Pancreas: Obscured by bowel gas. Spleen: Size and appearance within normal limits. Right Kidney: Length: 9.9 cm. Echogenicity within normal limits. No mass or hydronephrosis visualized. Left Kidney: Length: 10.9 cm. 4 mm nonobstructing interpolar stone. 18 mm cyst identified upper pole. 15 mm cyst identified in the interpolar region. Abdominal aorta: No aneurysm visualized. Other findings: None. IMPRESSION: 1. No acute findings in the abdomen. 2. 4 mm nonobstructing stone interpolar left kidney. 3. Small left renal cysts. Electronically Signed   By: Misty Stanley M.D.   On: 02/25/2017 20:50   Disposition   Pt is being discharged home today in good condition.  Follow-up Plans & Appointments    Follow-up Information    Imogene Burn, PA-C Follow up on 03/14/2017.   Specialty:  Cardiology Why:  at 12:15pm for your follow up appt.  Contact information: [address] [place] [address] [phone]          Discharge Instructions    Call MD for:  redness, tenderness, or signs of infection (pain, swelling, redness, odor or green/yellow discharge around incision site)    Complete by:  As directed    Diet - low sodium heart healthy    Complete by:  As directed    Discharge instructions    Complete by:  As directed    Groin Site Care Refer to this sheet in the next few weeks. These instructions provide you with information on caring for yourself after your procedure. Your caregiver may also give you more specific instructions. Your treatment has been planned according to current medical practices, but problems sometimes occur. Call your caregiver if you have any problems or questions after your procedure. HOME  CARE INSTRUCTIONS You may shower 24 hours after the procedure. Remove the bandage (dressing) and gently wash the site with plain soap and water. Gently pat the site dry.  Do not apply powder or lotion to the site.  Do not sit in a bathtub, swimming pool, or whirlpool for 5 to 7 days.  No bending, squatting, or lifting anything over 10 pounds (4.5 kg) as directed by your caregiver.  Inspect the site at least twice daily.  Do not drive home if you are discharged the same day of the procedure. Have someone else drive you.  You may drive 24 hours after the procedure unless otherwise instructed by your caregiver.  What to expect: Any bruising will usually fade within 1 to 2 weeks.  Blood that collects  in the tissue (hematoma) may be painful to the touch. It should usually decrease in size and tenderness within 1 to 2 weeks.  SEEK IMMEDIATE MEDICAL CARE IF: You have unusual pain at the groin site or down the affected leg.  You have redness, warmth, swelling, or pain at the groin site.  You have drainage (other than a small amount of blood on the dressing).  You have chills.  You have a fever or persistent symptoms for more than 72 hours.  You have a fever and your symptoms suddenly get worse.  Your leg becomes pale, cool, tingly, or numb.  You have heavy bleeding from the site. Hold pressure on the site. .   Increase activity slowly    Complete by:  As directed       Discharge Medications   Current Discharge Medication List    START taking these medications   Details  isosorbide mononitrate (IMDUR) 60 MG 24 hr tablet Take 1.5 tablets (90 mg total) by mouth daily. Qty: 60 tablet, Refills: 2    ranolazine (RANEXA) 1000 MG SR tablet Take 1 tablet (1,000 mg total) by mouth 2 (two) times daily. Qty: 60 tablet, Refills: 2      CONTINUE these medications which have CHANGED   Details  acetaminophen (TYLENOL) 500 MG tablet Take 1 tablet (500 mg total) by mouth every 6 (six) hours as needed  for moderate pain (wrist). Qty: 30 tablet, Refills: 0      CONTINUE these medications which have NOT CHANGED   Details  Colchicine 0.6 MG CAPS Day 1: Take 2 at the first sign of gout and a single tab in 1 hr. On Day 2 take 1 tablet. Continue 1 tab daily until the pain resolves Qty: 10 capsule, Refills: 0    lisinopril (PRINIVIL,ZESTRIL) 40 MG tablet Take 40 mg by mouth daily.    amLODipine (NORVASC) 10 MG tablet Take 1 tablet (10 mg total) by mouth daily. Qty: 90 tablet, Refills: 3    aspirin EC 81 MG EC tablet Take 1 tablet (81 mg total) by mouth daily.    b complex vitamins tablet Take 1 tablet by mouth every other day.     Calcium Carbonate (CALCIUM 500 PO) Take 1 tablet by mouth daily.    Cholecalciferol (VITAMIN D-3) 1000 units CAPS Take 1 capsule by mouth daily.    clopidogrel (PLAVIX) 75 MG tablet Take 1 tablet (75 mg total) by mouth daily with breakfast. Qty: 90 tablet, Refills: 2   Associated Diagnoses: Bruit (arterial)    Glucosamine 500 MG CAPS Take 1 capsule by mouth daily.     Multiple Vitamin (MULTIVITAMIN) capsule Take 1 capsule by mouth daily.    nitroGLYCERIN (NITROSTAT) 0.4 MG SL tablet Place 1 tablet (0.4 mg total) under the tongue every 5 (five) minutes x 3 doses as needed for chest pain. Qty: 25 tablet, Refills: 3    Omega-3 Fatty Acids (FISH OIL) 1000 MG CAPS Take 1 capsule by mouth daily.    simvastatin (ZOCOR) 20 MG tablet Take 1 tablet (20 mg total) by mouth daily. Qty: 90 tablet, Refills: 3    vitamin C (ASCORBIC ACID) 500 MG tablet Take 500 mg by mouth daily.        STOP taking these medications     aspirin 325 MG tablet      hydrochlorothiazide (MICROZIDE) 12.5 MG capsule          Aspirin prescribed at discharge?  Yes High Intensity Statin Prescribed? (Lipitor  40-80mg  or Crestor 20-40mg ): No: Elevated LFTs Beta Blocker Prescribed? No: 1st degree AVB For EF <40%, was ACEI/ARB Prescribed? Yes ADP Receptor Inhibitor Prescribed? (i.e.  Plavix etc.-Includes Medically Managed Patients): Yes For EF <40%, Aldosterone Inhibitor Prescribed? No: EF ok Was EF assessed during THIS hospitalization? Yes Was Cardiac Rehab II ordered? (Included Medically managed Patients): Yes   Outstanding Labs/Studies   LFTs/BMET at follow up visit  Duration of Discharge Encounter   Greater than 30 minutes including physician time.  Signed, Reino Bellis NP-C 02/28/2017, 8:43 AM

## 2017-02-27 NOTE — Care Management Note (Signed)
Case Management Note  Patient Details  Name: [name] MRN: [id number] Date of Birth: 10-29-1927  Subjective/Objective:  From home with wife, pta indep, s/p Coronary Atherectomy, will be on plavix.                    Action/Plan: NCM will follow for dc needs.   Expected Discharge Date:                  Expected Discharge Plan:     In-House Referral:  NA  Discharge planning Services  CM Consult  Post Acute Care Choice:  NA Choice offered to:  NA  DME Arranged:  N/A DME Agency:  NA  HH Arranged:  NA HH Agency:  NA  Status of Service:  Completed, signed off  If discussed at [place] of Stay Meetings, dates discussed:    Additional Comments:  Zenon Mayo, RN 02/27/2017, 2:05 PM

## 2017-02-27 NOTE — Interval H&P Note (Signed)
Cath Lab Visit (complete for each Cath Lab visit)  Clinical Evaluation Leading to the Procedure:   ACS: Yes.    Non-ACS:    Anginal Classification: CCS IV  Anti-ischemic medical therapy: Maximal Therapy (2 or more classes of medications)  Non-Invasive Test Results: No non-invasive testing performed  Prior CABG: No previous CABG      History and Physical Interval Note:  02/27/2017 9:51 AM  [name]  has presented today for surgery, with the diagnosis of CAD  The various methods of treatment have been discussed with the patient and family. After consideration of risks, benefits and other options for treatment, the patient has consented to  Procedure(s): Coronary Atherectomy (N/A) as a surgical intervention .  The patient's history has been reviewed, patient examined, no change in status, stable for surgery.  I have reviewed the patient's chart and labs.  Questions were answered to the patient's satisfaction.     Shelva Majestic

## 2017-02-27 NOTE — Progress Notes (Signed)
Progress Note  Patient Name: [name] Date of Encounter: 02/27/2017  Primary Cardiologist: Johnsie Cancel   Subjective   Had another episode of epigastric/substernal pain this morning while at rest.   Inpatient Medications    Scheduled Meds: . amLODipine  10 mg Oral QHS  . [START ON 02/28/2017] aspirin EC  81 mg Oral Daily  . cholecalciferol  1,000 Units Oral Daily  . clopidogrel  75 mg Oral Q breakfast  . colchicine  0.6 mg Oral Daily  . hydrochlorothiazide  12.5 mg Oral Daily  . isosorbide mononitrate  90 mg Oral Daily  . lisinopril  40 mg Oral Daily  . ranolazine  1,000 mg Oral BID  . sodium chloride flush  10 mL Intravenous Q12H  . sodium chloride flush  3 mL Intravenous Q12H  . sodium chloride flush  3 mL Intravenous Q12H   Continuous Infusions: . sodium chloride    . sodium chloride 1 mL/kg/hr (02/27/17 0705)   PRN Meds: sodium chloride, acetaminophen, alum & mag hydroxide-simeth, morphine injection, nitroGLYCERIN, ondansetron (ZOFRAN) IV, sodium chloride flush, sodium chloride flush   Vital Signs    Vitals:   02/26/17 1320 02/26/17 1922 02/26/17 2245 02/27/17 0543  BP: 120/74 (!) 102/52 124/66 121/69  Pulse: (!) 56 62  (!) 58  Resp: 18 17  16   Temp: 98 F (36.7 C)   97.9 F (36.6 C)  TempSrc: Oral   Oral  SpO2: 97% 96%    Weight:    165 lb 9.6 oz (75.1 kg)  Height:        Intake/Output Summary (Last 24 hours) at 02/27/17 0841 Last data filed at 02/27/17 0600  Gross per 24 hour  Intake            26.57 ml  Output                0 ml  Net            26.57 ml   Filed Weights   02/25/17 0351 02/26/17 0300 02/27/17 0543  Weight: 168 lb 8 oz (76.4 kg) 167 lb 4.8 oz (75.9 kg) 165 lb 9.6 oz (75.1 kg)    Telemetry    SB with intermittent dropped QRS - Personally Reviewed  ECG    N/a - Personally Reviewed  Physical Exam   General: Well developed, well nourished, male appearing in no acute distress. Head: Normocephalic, atraumatic.  Neck: Supple  without bruits, JVD. Lungs:  Resp regular and unlabored, CTA. Heart: RRR, S1, S2, no S3, S4, or murmur; no rub. Abdomen: Soft, non-tender, non-distended with normoactive bowel sounds. No hepatomegaly. No rebound/guarding. No obvious abdominal masses. Extremities: No clubbing, cyanosis, edema. Distal pedal pulses are 2+ bilaterally. Right radial AVF, thrill/bruit noted.  Neuro: Alert and oriented X 3. Moves all extremities spontaneously. Psych: Normal affect.  Labs    Chemistry Recent Labs Lab 02/24/17 1916 02/26/17 0252 02/27/17 0311  NA 138 139 137  K 4.1 3.5 4.2  CL 110 109 108  CO2 20* 22 20*  GLUCOSE 133* 114* 99  BUN 28* 26* 28*  CREATININE 1.40* 1.24 1.44*  CALCIUM 8.6* 8.7* 8.9  PROT 5.7* 6.0* 5.9*  ALBUMIN 3.3* 3.3* 3.4*  AST 121* 147* 103*  ALT 129* 193* 178*  ALKPHOS 50 52 51  BILITOT 0.6 0.6 0.5  GFRNONAA 44* 50* 42*  GFRAA 51* 58* 49*  ANIONGAP 8 8 9      Hematology Recent Labs Lab 02/22/17 4765 02/23/17 4650 02/27/17 3546  WBC 7.0 5.5 5.8  RBC 3.61* 3.46* 3.67*  HGB 11.3* 10.7* 11.5*  HCT 34.1* 33.1* 34.9*  MCV 94.5 95.7 95.1  MCH 31.3 30.9 31.3  MCHC 33.1 32.3 33.0  RDW 13.5 13.7 13.7  PLT 157 161 185    Cardiac Enzymes Recent Labs Lab 02/21/17 0204 02/22/17 0211 02/22/17 0707 02/22/17 1119  TROPONINI <0.03 0.27* 0.31* 0.31*    Recent Labs Lab 02/21/17 2131  TROPIPOC 0.04     BNPNo results for input(s): BNP, PROBNP in the last 168 hours.   DDimer No results for input(s): DDIMER in the last 168 hours.    Radiology    US Abdomen Complete  Result Date: 02/25/2017 CLINICAL DATA:  Abdominal pain EXAM: ABDOMEN ULTRASOUND COMPLETE COMPARISON:  None. FINDINGS: Gallbladder: No gallstones or gallbladder wall thickening. No pericholecystic fluid. The sonographer reports no sonographic Murphy's sign. Common bile duct: Diameter: 5 mm Liver: No focal lesion identified. Coarsened echogenicity with poor acoustic through transmission suggests  fatty deposition. IVC: No abnormality visualized. Pancreas: Obscured by bowel gas. Spleen: Size and appearance within normal limits. Right Kidney: Length: 9.9 cm. Echogenicity within normal limits. No mass or hydronephrosis visualized. Left Kidney: Length: 10.9 cm. 4 mm nonobstructing interpolar stone. 18 mm cyst identified upper pole. 15 mm cyst identified in the interpolar region. Abdominal aorta: No aneurysm visualized. Other findings: None. IMPRESSION: 1. No acute findings in the abdomen. 2. 4 mm nonobstructing stone interpolar left kidney. 3. Small left renal cysts. Electronically Signed   By: Misty Stanley M.D.   On: 02/25/2017 20:50    Cardiac Studies   Cardiac cath 02/21/17: IMPRESSION:[name] had a widely patent RCA stent placed one year ago. He does have a moderate distal RCA lesion just before the takeoff of the PDA which is unchanged from his prior cath. He has an occluded circumflex which is nondominant and high-grade ostial, proximal and mid ramus branch stenosis as well as scattered noncritical LAD disease. He does have an apical LAD occlusion which is old as well. I do not think that his anatomy is clinically changed from a year ago. I recommend continued medical therapy. A right femoral angiogram was performed and a MYNX closure device was successfully deployed achieving hemostasis. The patient can be discharged home later today with medical therapy.    Patient Profile     83 y.o. male with history of CAD s/p prior stenting of the RCA, carotid artery disease, HTN, HLD and prior CVA presented with recurrent chest pain/elevated troponin after being discharged earlier same day on 7/5.   Cardiac cath 02/21/17 with diffuse severe CAD. Dr. Gwenlyn Found did not feel that there was a change in his coronary anatomy and no PCI was done. The LAD and Ramus are patent with diffuse disease. The Circumflex is occluded. The distal RCA has moderate disease.   He was admitted the day after his cath with  recurrent pain and mild troponin elevation. Plan about a week and was to titrate medications for medical management, he has now failed medical management with persistent recurrent exertional angina (class 3-4).  On Monday, July 9, Dr. Fransico Him ordered an abdominal ultrasound to evaluate elevated LFTs in the setting of his epigastric discomfort. This was shown to be relatively normal.  Assessment & Plan    1. CAD with unstable angina/NSTEMI: Readmitted for recurrent chest pain. Troponin trend 0.27-->0.31-->0.31. Up titrated Imdur and Renexa yesterday. Continues to have intermittent episodes of epigastric/substernal pain with exertion and rest.  - films were  reviewed by Dr. Ellyn Hack and colleagues yesterday. He is planned for cath with PCI/rotablator of the osital/mad LAD with Dr. Claiborne Billings today.  - The patient understands that risks included but are not limited to stroke (1 in 1000), death (1 in 36), kidney failure [usually temporary] (1 in 500), bleeding (1 in 200), allergic reaction [possibly serious] (1 in 200).   2. Mobitz I AV block: - asymptomatic. Avoid AV blocking agent.   3. NSVT: Resolved  - Unable to add BB due to bradycardia.   4. Elevated LFTs: - ABD [place] negative. LFTs now trending down. Statin on hold.   5. HLD: - 02/21/2017: Cholesterol 109; HDL 46; LDL Cholesterol 53; Triglycerides 52; VLDL 10 - Hold statin as above  6. HTN: - Stable - will hold HCTZ today given cath.   7. AKI: mild increase in Cr today. Getting pre-hydration cath.  Signed, Reino Bellis, NP  02/27/2017, 8:41 AM

## 2017-02-28 ENCOUNTER — Encounter (HOSPITAL_COMMUNITY): Payer: Self-pay | Admitting: Cardiovascular Disease

## 2017-02-28 ENCOUNTER — Other Ambulatory Visit: Payer: Self-pay | Admitting: Cardiology

## 2017-02-28 ENCOUNTER — Ambulatory Visit: Payer: Medicare Other | Admitting: Physician Assistant

## 2017-02-28 DIAGNOSIS — Z955 Presence of coronary angioplasty implant and graft: Secondary | ICD-10-CM

## 2017-02-28 DIAGNOSIS — I25709 Atherosclerosis of coronary artery bypass graft(s), unspecified, with unspecified angina pectoris: Secondary | ICD-10-CM

## 2017-02-28 DIAGNOSIS — I441 Atrioventricular block, second degree: Secondary | ICD-10-CM

## 2017-02-28 HISTORY — DX: Presence of coronary angioplasty implant and graft: Z95.5

## 2017-02-28 LAB — CBC
HEMATOCRIT: 33.7 % — AB (ref 39.0–52.0)
HEMOGLOBIN: 10.9 g/dL — AB (ref 13.0–17.0)
MCH: 30.7 pg (ref 26.0–34.0)
MCHC: 32.3 g/dL (ref 30.0–36.0)
MCV: 94.9 fL (ref 78.0–100.0)
Platelets: 170 10*3/uL (ref 150–400)
RBC: 3.55 MIL/uL — AB (ref 4.22–5.81)
RDW: 13.6 % (ref 11.5–15.5)
WBC: 5.2 10*3/uL (ref 4.0–10.5)

## 2017-02-28 LAB — COMPREHENSIVE METABOLIC PANEL
ALK PHOS: 52 U/L (ref 38–126)
ALT: 142 U/L — AB (ref 17–63)
ANION GAP: 8 (ref 5–15)
AST: 75 U/L — ABNORMAL HIGH (ref 15–41)
Albumin: 3.1 g/dL — ABNORMAL LOW (ref 3.5–5.0)
BILIRUBIN TOTAL: 0.5 mg/dL (ref 0.3–1.2)
BUN: 23 mg/dL — ABNORMAL HIGH (ref 6–20)
CALCIUM: 8 mg/dL — AB (ref 8.9–10.3)
CO2: 18 mmol/L — ABNORMAL LOW (ref 22–32)
Chloride: 112 mmol/L — ABNORMAL HIGH (ref 101–111)
Creatinine, Ser: 1.32 mg/dL — ABNORMAL HIGH (ref 0.61–1.24)
GFR, EST AFRICAN AMERICAN: 54 mL/min — AB (ref >60)
GFR, EST NON AFRICAN AMERICAN: 47 mL/min — AB (ref >60)
Glucose, Bld: 107 mg/dL — ABNORMAL HIGH (ref 65–99)
Potassium: 4.2 mmol/L (ref 3.5–5.1)
SODIUM: 138 mmol/L (ref 135–145)
TOTAL PROTEIN: 5.5 g/dL — AB (ref 6.5–8.1)

## 2017-02-28 MED ORDER — ACETAMINOPHEN 500 MG PO TABS: 500.0000 mg | ORAL_TABLET | Freq: Four times a day (QID) | ORAL | 0 refills | Status: DC | PRN

## 2017-02-28 MED ORDER — RANOLAZINE ER 1000 MG PO TB12: 1000.0000 mg | ORAL_TABLET | Freq: Two times a day (BID) | ORAL | 2 refills | Status: DC

## 2017-02-28 MED ORDER — ISOSORBIDE MONONITRATE ER 60 MG PO TB24: 90.0000 mg | ORAL_TABLET | Freq: Every day | ORAL | 2 refills | Status: DC

## 2017-02-28 MED FILL — Nitroglycerin IV Soln 200 MCG/ML in D5W: INTRAVENOUS | Qty: 250 | Status: AC

## 2017-02-28 NOTE — Progress Notes (Signed)
CARDIAC REHAB PHASE I   PRE:  Rate/Rhythm: 73 SR  BP:  Sitting: 141/61    MODE:  Ambulation: 300 ft   POST:  Rate/Rhythm: 72 SR  BP:  Sitting: 149/60   Pt ambulated 300 ft on RA, handheld assist, steady gait, tolerated fairly well.  Pt c/o mild DOE, denies cp, dizziness, declined rest stop. Completed PCI/stent education.  Reviewed risk factors, PCI book, anti-platelet therapy, stent card, activity restrictions, ntg, exercise, heart healthy diet and phase 2 cardiac rehab. Pt verbalized understanding. Pt agrees to phase 2 cardiac rehab referral, will [facility] per pt request. Pt to recliner after walk, call bell within reach.    [id number] Lenna Sciara, RN, BSN 02/28/2017 9:54 AM

## 2017-02-28 NOTE — Progress Notes (Signed)
Late entry:    Site area: right groin  Site Prior to Removal:  Level 1  Pressure Applied For 20 MINUTES    Minutes Beginning at 1550  Manual:   Yes.    Patient Status During Pull:  stable  Post Pull Groin Site:  Level 0  Post Pull Instructions Given:  Yes.    Post Pull Pulses Present:  Yes.    Dressing Applied:  Yes.    Comments:  Bruised from previous diagnostic cath.

## 2017-03-06 ENCOUNTER — Telehealth (HOSPITAL_COMMUNITY): Payer: Self-pay

## 2017-03-06 NOTE — Telephone Encounter (Signed)
Patient insurances are active and benefits verified. Patient has Medicare A/B and Tricare. Medicare A/B - no co-payment, deductible $183.00/$183.00 has been met, 20% co-insurance, no out of pocket and no pre-authorization. Passport/reference [phone]. Tricare - no co-payment, no deductible, no out of pocket, no co-insurance and no pre-authorization. Passport. reference [phone].

## 2017-03-14 ENCOUNTER — Encounter: Payer: Self-pay | Admitting: Physician Assistant

## 2017-03-14 ENCOUNTER — Ambulatory Visit (INDEPENDENT_AMBULATORY_CARE_PROVIDER_SITE_OTHER): Payer: Medicare Other | Admitting: Physician Assistant

## 2017-03-14 VITALS — BP 120/56 | HR 60 | Ht 66.0 in | Wt 172.8 lb

## 2017-03-14 DIAGNOSIS — I25119 Atherosclerotic heart disease of native coronary artery with unspecified angina pectoris: Secondary | ICD-10-CM

## 2017-03-14 DIAGNOSIS — E7800 Pure hypercholesterolemia, unspecified: Secondary | ICD-10-CM

## 2017-03-14 DIAGNOSIS — I6523 Occlusion and stenosis of bilateral carotid arteries: Secondary | ICD-10-CM

## 2017-03-14 DIAGNOSIS — I10 Essential (primary) hypertension: Secondary | ICD-10-CM | POA: Diagnosis not present

## 2017-03-14 DIAGNOSIS — I209 Angina pectoris, unspecified: Secondary | ICD-10-CM

## 2017-03-14 NOTE — Patient Instructions (Signed)
Medication Instructions:  Your physician recommends that you continue on your current medications as directed. Please refer to the Current Medication list given to you today.   Labwork: -None  Testing/Procedures: -None  Follow-Up: Your physician recommends that you keep your scheduled  follow-up appointment with Dr. Johnsie Cancel.   Any Other Special Instructions Will Be Listed Below (If Applicable).   Message was sent to Cardiac Rehab today.  Patient should be hearing from that office in the next few weeks.   If you need a refill on your cardiac medications before your next appointment, please call your pharmacy.

## 2017-03-14 NOTE — Progress Notes (Signed)
Cardiology Office Note    Date:  03/14/2017   ID:  [name], DOB [birth year], MRN [id number]  PCP:  Helane Rima, MD  Cardiologist:  Dr. Johnsie Cancel  Chief Complaint  Patient presents with  . Follow-up    History of Present Illness:  [name] is a 83 y.o. male with history of CAD S/P DES RCA Complicated by right radial AV fistula, carotid stenosis status post carotid endarterectomy, hypertension, HLD, and CVA.  He was discharged 02/21/17 following an admission for unstable angina. Left heart cath revealed stable severe CAD moderate distal RCA, occluded circumflex, high-grade ostial proximal and mid ramus stenosis and scattered noncritical LAD disease and a patent RCA stent. He was discharged home on medical therapy.  Patient returned to the hospital with recurrent chest pain ST elevation in aVR and inferolateral ST depression. Troponin peaked at 0.31. Patient was placed on Ranexa and Imdur but continued to have chest pain. He underwent repeat cardiac catheterization and successful Rotablator guided PCI and overlapping DES of the ramus 1 lesions. Lifelong DAPT recommended. Not on beta blockers due to history of AV block in the past.  Patient comes in today for follow-up. He denies any angina. He would like to start cardiac rehabilitation as soon as possible.  Past Medical History:  Diagnosis Date  . AV BLOCK, 1ST DEGREE   . Carotid artery disease ([place])    a. s/p Left ECA followed by Dr. Donnetta Hutching  . Coronary artery disease involving coronary bypass graft of native heart with angina pectoris ([place])    a. LHC 12/2015  3vd Left Cx 100% with colaterals, and distally LAD occluded, DES to prox-mid RCA  b. 02/2017: repeat cath with patent RCA stent and stable CAD; 02/27/2017: Rota-PCI to Ost RI  . GERD (gastroesophageal reflux disease)   . HLD (hyperlipidemia)   . HTN (hypertension)   . NSVT (nonsustained ventricular tachycardia) ([place])    during stress test 12/2015  . Presence of drug coated stent  in RCA & Ramus Intermedius. 02/28/2017   12/2015: PCI p-mRCA - Stent Resolute Integ 3.5x34 02/2017: Rota-PCI o-mRamus - overlapping DES STENT SYNERGY DES 3X32  & 3x24 (tapered post-dilation)  . Stroke [facility])     Past Surgical History:  Procedure Laterality Date  . CARDIAC CATHETERIZATION     x2  . CARDIAC CATHETERIZATION N/A 01/11/2016   Procedure: Left Heart Cath and Coronary Angiography;  Surgeon: Wellington Hampshire, MD;  Location: [facility];  Service: Cardiovascular;  Laterality: N/A;  . CAROTID ENDARTERECTOMY  11/28/2006   left  . CORONARY ATHERECTOMY N/A 02/27/2017   Procedure: Coronary Atherectomy;  Surgeon: Troy Sine, MD;  Location: [facility];  Service: Cardiovascular;  Laterality: N/A;  . HERNIA REPAIR    . LEFT HEART CATH AND CORONARY ANGIOGRAPHY N/A 02/21/2017   Procedure: Left Heart Cath and Coronary Angiography;  Surgeon: Lorretta Harp, MD;  Location: [facility];  Service: Cardiovascular;  Laterality: N/A;  . TONSILLECTOMY      Current Medications: Current Meds  Medication Sig  . acetaminophen (TYLENOL) 500 MG tablet Take 1 tablet (500 mg total) by mouth every 6 (six) hours as needed for moderate pain (wrist).  Marland Kitchen amLODipine (NORVASC) 10 MG tablet Take 1 tablet (10 mg total) by mouth daily.  Marland Kitchen aspirin EC 81 MG EC tablet Take 1 tablet (81 mg total) by mouth daily.  Marland Kitchen b complex vitamins tablet Take 1 tablet by mouth every other day.   Marland Kitchen  Calcium Carbonate (CALCIUM 500 PO) Take 1 tablet by mouth daily.  . Cholecalciferol (VITAMIN D-3) 1000 units CAPS Take 1 capsule by mouth daily.  . clopidogrel (PLAVIX) 75 MG tablet Take 1 tablet (75 mg total) by mouth daily with breakfast.  . Glucosamine 500 MG CAPS Take 1 capsule by mouth daily.   . isosorbide mononitrate (IMDUR) 60 MG 24 hr tablet Take 1.5 tablets (90 mg total) by mouth daily.  Marland Kitchen lisinopril (PRINIVIL,ZESTRIL) 40 MG tablet Take 40 mg by mouth daily.  . Multiple Vitamin (MULTIVITAMIN) capsule Take 1  capsule by mouth daily.  . nitroGLYCERIN (NITROSTAT) 0.4 MG SL tablet place 1 tablet under the tongue if needed every 5 minutes fo  . Omega-3 Fatty Acids (FISH OIL) 1000 MG CAPS Take 1 capsule by mouth daily.  . ranolazine (RANEXA) 1000 MG SR tablet Take 1 tablet (1,000 mg total) by mouth 2 (two) times daily.  . simvastatin (ZOCOR) 20 MG tablet Take 1 tablet (20 mg total) by mouth daily.  . vitamin C (ASCORBIC ACID) 500 MG tablet Take 500 mg by mouth daily.       Allergies:   Patient has no known allergies.   Social History   Social History  . Marital status: Married    Spouse name: N/A  . Number of children: N/A  . Years of education: N/A   Social History Main Topics  . Smoking status: Never Smoker  . Smokeless tobacco: Never Used  . Alcohol use 9.0 oz/week    15 Glasses of wine per week  . Drug use: No  . Sexual activity: Not Asked   Other Topics Concern  . None   Social History Narrative  . None     Family History:  The patient's  family history includes Cancer (age of onset: 78) in his sister; Diabetes in his mother and sister; Heart disease in his sister.   ROS:   Please see the history of present illness.    Review of Systems  Constitution: Negative.  HENT: Negative.   Cardiovascular: Negative.   Respiratory: Negative.   Endocrine: Negative.   Hematologic/Lymphatic: Negative.   Musculoskeletal: Negative.   Gastrointestinal: Negative.   Genitourinary: Negative.   Neurological: Negative.    All other systems reviewed and are negative.   PHYSICAL EXAM:   VS:  BP (!) 120/56 (BP Location: Left Arm, Patient Position: Sitting, Cuff Size: Normal)   Pulse 60   Ht 5\' 6"  (1.676 m)   Wt 172 lb 12.8 oz (78.4 kg)   BMI 27.89 kg/m   Physical Exam  GEN: Elderly, in no acute distress  Neck: no JVD, carotid bruits, or masses Cardiac:RRR; Positive S4 and 1/6 systolic murmur at the left sternal border Respiratory:  clear to auscultation bilaterally, normal work of  breathing GI: soft, nontender, nondistended, + BS Ext: Right groin without hematoma or hemorrhage at cath site otherwise lower extremities without cyanosis, clubbing, or edema, Good distal pulses bilaterally right radial artery with thrill from prior AV fistula Neuro:  Alert and Oriented x 3 Psych: euthymic mood, full affect  Wt Readings from Last 3 Encounters:  03/14/17 172 lb 12.8 oz (78.4 kg)  02/28/17 167 lb 8.8 oz (76 kg)  02/21/17 170 lb (77.1 kg)      Studies/Labs Reviewed:   EKG:  EKG is ordered today.  The ekg ordered today demonstrates Normal sinus rhythm with first-degree AV block, PVC  Recent Labs: 02/24/2017: Magnesium 1.8 02/28/2017: ALT 142; BUN 23; Creatinine, Ser 1.32;  Hemoglobin 10.9; Platelets 170; Potassium 4.2; Sodium 138   Lipid Panel    Component Value Date/Time   CHOL 109 02/21/2017 0204   TRIG 52 02/21/2017 0204   HDL 46 02/21/2017 0204   CHOLHDL 2.4 02/21/2017 0204   VLDL 10 02/21/2017 0204   LDLCALC 53 02/21/2017 0204    Additional studies/ records that were reviewed today include:   LHC: 02/27/17   Conclusion       Prox RCA to Mid RCA lesion, 0 %stenosed.  Mid RCA lesion, 30 %stenosed.  Dist RCA lesion, 70 %stenosed.  Ost Cx to Prox Cx lesion, 100 %stenosed.  Dist LAD lesion, 100 %stenosed.  Mid LAD lesion, 50 %stenosed.  Ost LAD to Prox LAD lesion, 60 %stenosed.  Ramus-2 lesion, 70 %stenosed.  A STENT SYNERGY DES 3X32 drug eluting stent was successfully placed.  Ramus-1 lesion, 80 %stenosed.  Post intervention, there is a 0% residual stenosis.  A STENT SYNERGY DES 3X24 drug eluting stent was successfully placed, and overlaps previously placed stent.  Ost Ramus lesion, 95 %stenosed.  Post intervention, there is a 0% residual stenosis.   Successful complex PCI to the ramus intermediate vessel which had a 95% ostial stenosis followed by diffuse 80% proximal stenosis and 70% mid stenosis.  The three lesion were treated with  high-speed rotational atherectomy with a 1.5 mm and a 1.75 mm burr,  cutting balloon atherotomy with a 3.010 mm Wolverine balloon, and tandem DES stenting with a 3.032 mm and 3.024 mm Synergy DES stents postdilated to 3.28 m millimeters ostially to 3.21 mm distally with the entire segments being reduced to 0%.    RECOMMENDATION: Lifelong DAPT.  Continue aggressive medical therapy for the patient's severe concomitant CAD.       ASSESSMENT:    1. Coronary artery disease involving native coronary artery of native heart with angina pectoris ([place])   2. Essential hypertension   3. Pure hypercholesterolemia      PLAN:  In order of problems listed above: CAD status post PCI with high-speed rotational atherectomy in overlapping drug-eluting stents to the ramus 02/27/17. Currently without angina. Recommend cardiac rehabilitation. Continue Imdur and Ranexa, Plavix and aspirin  Essential hypertension continue amlodipine 10 mg daily  Hypercholesterolemia continue simvastatin.     Medication Adjustments/Labs and Tests Ordered: Current medicines are reviewed at length with the patient today.  Concerns regarding medicines are outlined above.  Medication changes, Labs and Tests ordered today are listed in the Patient Instructions below. Patient Instructions  Medication Instructions:  Your physician recommends that you continue on your current medications as directed. Please refer to the Current Medication list given to you today.   Labwork: -None  Testing/Procedures: -None  Follow-Up: Your physician recommends that you keep your scheduled  follow-up appointment with Dr. Johnsie Cancel.   Any Other Special Instructions Will Be Listed Below (If Applicable).   Message was sent to Cardiac Rehab today.  Patient should be hearing from that office in the next few weeks.   If you need a refill on your cardiac medications before your next appointment, please call your pharmacy.       Sumner Boast, PA-C  03/14/2017 3:30 PM    [facility] [address], [place], [place]  [address] Phone: [phone]; Fax: [phone]

## 2017-03-15 ENCOUNTER — Telehealth (HOSPITAL_COMMUNITY): Payer: Self-pay

## 2017-03-15 NOTE — Telephone Encounter (Signed)
I called and left message on patient voicemail to call office about scheduling for cardiac rehab. I left office contact information on patient voicemail to return call.  ° °

## 2017-03-18 ENCOUNTER — Telehealth (HOSPITAL_COMMUNITY): Payer: Self-pay

## 2017-03-18 NOTE — Telephone Encounter (Signed)
Cardiac Rehab Medication Review by a Pharmacist  Does the patient  feel that his/her medications are working for him/her?  yes  Has the patient been experiencing any side effects to the medications prescribed?  no  Does the patient measure his/her own blood pressure or blood glucose at home?  yes   Does the patient have any problems obtaining medications due to transportation or finances?   no  Understanding of regimen: good Understanding of indications: good Potential of compliance: good   Pharmacist comments: Patient endorses adherence and denies adverse effects. He does monitor his blood pressure at home usually, with blood pressure running in the 100/70s mmHg. He states he will need to replace the batteries in the cuff to continue monitoring.    Leroy Libman, PharmD Pharmacy Resident Pager: [phone] 03/18/2017 10:46 AM

## 2017-03-19 ENCOUNTER — Ambulatory Visit: Payer: Medicare Other | Admitting: Physician Assistant

## 2017-03-19 ENCOUNTER — Encounter (HOSPITAL_COMMUNITY): Payer: Self-pay

## 2017-03-19 ENCOUNTER — Encounter (HOSPITAL_COMMUNITY)
Admission: RE | Admit: 2017-03-19 | Discharge: 2017-03-19 | Disposition: A | Discharge status: Home / Self Care | Payer: Medicare Other | Source: Ambulatory Visit | Attending: Cardiovascular Disease | Admitting: Cardiovascular Disease

## 2017-03-19 VITALS — BP 118/60 | HR 59 | Ht 67.5 in | Wt 174.4 lb

## 2017-03-19 DIAGNOSIS — Z9889 Other specified postprocedural states: Secondary | ICD-10-CM

## 2017-03-19 DIAGNOSIS — Z955 Presence of coronary angioplasty implant and graft: Secondary | ICD-10-CM | POA: Insufficient documentation

## 2017-03-19 DIAGNOSIS — Z48812 Encounter for surgical aftercare following surgery on the circulatory system: Secondary | ICD-10-CM | POA: Diagnosis present

## 2017-03-19 HISTORY — DX: Atherosclerotic heart disease of native coronary artery without angina pectoris: I25.10

## 2017-03-19 NOTE — Progress Notes (Signed)
[name] 83 y.o. male DOB: 1928/08/10 MRN: [id number]      Nutrition Note  1. 02/27/17 atherectomy   2. 02/27/17 DES x2    Past Medical History:  Diagnosis Date  . AV BLOCK, 1ST DEGREE   . Carotid artery disease ([place])    a. s/p Left ECA followed by Dr. Donnetta Hutching  . Coronary artery disease involving coronary bypass graft of native heart with angina pectoris ([place])    a. LHC 12/2015  3vd Left Cx 100% with colaterals, and distally LAD occluded, DES to prox-mid RCA  b. 02/2017: repeat cath with patent RCA stent and stable CAD; 02/27/2017: Rota-PCI to Ost RI  . GERD (gastroesophageal reflux disease)   . HLD (hyperlipidemia)   . HTN (hypertension)   . NSVT (nonsustained ventricular tachycardia) ([place])    during stress test 12/2015  . Presence of drug coated stent in RCA & Ramus Intermedius. 02/28/2017   12/2015: PCI p-mRCA - Stent Resolute Integ 3.5x34 02/2017: Rota-PCI o-mRamus - overlapping DES STENT SYNERGY DES 3X32  & 3x24 (tapered post-dilation)  . Stroke [facility])    Meds reviewed.   HT: Ht Readings from Last 1 Encounters:  03/19/17 5' 7.5" (1.715 m)    WT: Wt Readings from Last 3 Encounters:  03/19/17 174 lb 6.1 oz (79.1 kg)  03/14/17 172 lb 12.8 oz (78.4 kg)  02/28/17 167 lb 8.8 oz (76 kg)     BMI 27.0   Current tobacco use? No  Labs:  Lipid Panel     Component Value Date/Time   CHOL 109 02/21/2017 0204   TRIG 52 02/21/2017 0204   HDL 46 02/21/2017 0204   CHOLHDL 2.4 02/21/2017 0204   VLDL 10 02/21/2017 0204   LDLCALC 53 02/21/2017 0204    Lab Results  Component Value Date   HGBA1C 6.0 (H) 01/11/2016   CBG (last 3)  No results for input(s): GLUCAP in the last 72 hours.  Nutrition Note Spoke with pt. Nutrition plan and goals reviewed with pt. Pt is working toward following Step 1 of the Therapeutic Lifestyle Changes diet. Pt wants to lose wt. Wt loss tips reviewed. Age-appropriate diet and wt loss recommendations discussed.  Pt's elevated A1c noted. Pt expressed  understanding of the information reviewed. Pt aware of nutrition education classes offered and is unable to attend nutrition classes.  Nutrition Diagnosis ? Food-and nutrition-related knowledge deficit related to lack of exposure to information as related to diagnosis of: ? CVD ? Pre-DM ? Overweight related to excessive energy intake as evidenced by a BMI of 27.0  Nutrition Intervention ? Pt's individual nutrition plan and goals reviewed with pt. ? Pt given handouts for: ? Nutrition I class ? Nutrition II class   Nutrition Goal(s):  ? Pt to identify food quantities necessary to achieve weight loss of 6-10 lb at graduation from cardiac rehab. Goal wt of 160 lb desired.  Plan:  Pt to attend nutrition classes ? Portion Distortion  Will provide client-centered nutrition education as part of interdisciplinary care.   Monitor and evaluate progress toward nutrition goal with team.  Derek Mound, M.Ed, RD, LDN, CDE 03/19/2017 12:13 PM

## 2017-03-19 NOTE — Progress Notes (Signed)
Cardiac Individual Treatment Plan  Patient Details  Name: [name] MRN: [id number] Date of Birth: [birth year] Referring Provider:     CARDIAC REHAB PHASE II ORIENTATION from 03/19/2017 in [place]  Referring Provider  Jenkins Rouge MD      Initial Encounter Date:    CARDIAC REHAB PHASE II ORIENTATION from 03/19/2017 in [place]  Date  03/19/17  Referring Provider  Jenkins Rouge MD      Visit Diagnosis: 02/27/17 atherectomy  02/27/17 DES x2  Patient's Home Medications on Admission:  Current Outpatient Prescriptions:  .  acetaminophen (TYLENOL) 500 MG tablet, Take 1 tablet (500 mg total) by mouth every 6 (six) hours as needed for moderate pain (wrist)., Disp: 30 tablet, Rfl: 0 .  amLODipine (NORVASC) 10 MG tablet, Take 1 tablet (10 mg total) by mouth daily., Disp: 90 tablet, Rfl: 3 .  aspirin EC 81 MG EC tablet, Take 1 tablet (81 mg total) by mouth daily., Disp: , Rfl:  .  b complex vitamins tablet, Take 1 tablet by mouth every other day. , Disp: , Rfl:  .  Calcium Carbonate (CALCIUM 500 PO), Take 1 tablet by mouth daily., Disp: , Rfl:  .  Cholecalciferol (VITAMIN D-3) 1000 units CAPS, Take 1 capsule by mouth daily., Disp: , Rfl:  .  clopidogrel (PLAVIX) 75 MG tablet, Take 1 tablet (75 mg total) by mouth daily with breakfast., Disp: 90 tablet, Rfl: 2 .  Glucosamine 500 MG CAPS, Take 1 capsule by mouth daily. , Disp: , Rfl:  .  isosorbide mononitrate (IMDUR) 60 MG 24 hr tablet, Take 1.5 tablets (90 mg total) by mouth daily., Disp: 60 tablet, Rfl: 2 .  lisinopril (PRINIVIL,ZESTRIL) 40 MG tablet, Take 40 mg by mouth daily., Disp: , Rfl:  .  Multiple Vitamin (MULTIVITAMIN) capsule, Take 1 capsule by mouth daily., Disp: , Rfl:  .  nitroGLYCERIN (NITROSTAT) 0.4 MG SL tablet, place 1 tablet under the tongue if needed every 5 minutes fo, Disp: 25 tablet, Rfl: 3 .  Omega-3 Fatty Acids (FISH OIL) 1000 MG CAPS, Take 1 capsule by  mouth daily., Disp: , Rfl:  .  ranolazine (RANEXA) 1000 MG SR tablet, Take 1 tablet (1,000 mg total) by mouth 2 (two) times daily., Disp: 60 tablet, Rfl: 2 .  simvastatin (ZOCOR) 20 MG tablet, Take 1 tablet (20 mg total) by mouth daily., Disp: 90 tablet, Rfl: 3 .  vitamin C (ASCORBIC ACID) 500 MG tablet, Take 500 mg by mouth daily.  , Disp: , Rfl:   Past Medical History: Past Medical History:  Diagnosis Date  . AV BLOCK, 1ST DEGREE   . Carotid artery disease ([place])    a. s/p Left ECA followed by Dr. Donnetta Hutching  . Coronary artery disease   . Coronary artery disease involving coronary bypass graft of native heart with angina pectoris ([place])    a. LHC 12/2015  3vd Left Cx 100% with colaterals, and distally LAD occluded, DES to prox-mid RCA  b. 02/2017: repeat cath with patent RCA stent and stable CAD; 02/27/2017: [facility]-PCI to [place]  . GERD (gastroesophageal reflux disease)   . HLD (hyperlipidemia)   . HTN (hypertension)   . NSVT (nonsustained ventricular tachycardia) ([place])    during stress test 12/2015  . Presence of drug coated stent in RCA & Ramus Intermedius. 02/28/2017   12/2015: PCI p-mRCA - Stent Resolute Integ 3.5x34 02/2017: [facility]-PCI o-mRamus - overlapping DES STENT SYNERGY DES 3X32  &  3x24 (tapered post-dilation)  . Stroke [facility])     Tobacco Use: History  Smoking Status  . Never Smoker  Smokeless Tobacco  . Never Used    Labs: Recent Review Flowsheet Data    Labs for ITP Cardiac and Pulmonary Rehab Latest Ref Rng & Units 05/24/2010 01/11/2016 01/12/2016 02/21/2017   Cholestrol 0 - 200 mg/dL 139 - 120 109   LDLCALC 0 - 99 mg/dL 70 - 48 53   HDL >40 mg/dL 51.30 - 52 46   Trlycerides <150 mg/dL 87.0 - 98 52   Hemoglobin A1c 4.8 - 5.6 % - 6.0(H) - -      Capillary Blood Glucose: No results found for: GLUCAP   Exercise Target Goals: Date: 03/19/17  Exercise Program Goal: Individual exercise prescription set with THRR, safety & activity barriers. Participant demonstrates ability to  understand and report RPE using BORG scale, to self-measure pulse accurately, and to acknowledge the importance of the exercise prescription.  Exercise Prescription Goal: Starting with aerobic activity 30 plus minutes a day, 3 days per week for initial exercise prescription. Provide home exercise prescription and guidelines that participant acknowledges understanding prior to discharge.  Activity Barriers & Risk Stratification:     Activity Barriers & Cardiac Risk Stratification - 03/19/17 0819      Activity Barriers & Cardiac Risk Stratification   Activity Barriers Arthritis;Deconditioning;Muscular Weakness;Balance Concerns   Cardiac Risk Stratification High      6 Minute Walk:     6 Minute Walk    Row Name 03/19/17 1038         6 Minute Walk   Phase Initial     Distance 12173 feet     Walk Time 6 minutes     # of Rest Breaks 0     MPH 2.42     METS 1.66     RPE 13     VO2 Peak 5.8     Symptoms Yes (comment)     Comments pt c/o "woozy/dizzy" with walk test. Pt did not eat breakfast, given light snack (banana/PB crackers, ginger ale) symptoms resolved.     Resting HR 59 bpm     Resting BP 118/60     Max Ex. HR 72 bpm     Max Ex. BP 124/72     2 Minute Post BP 112/52        Oxygen Initial Assessment:   Oxygen Re-Evaluation:   Oxygen Discharge (Final Oxygen Re-Evaluation):   Initial Exercise Prescription:     Initial Exercise Prescription - 03/19/17 1000      Date of Initial Exercise RX and Referring Provider   Date 03/19/17   Referring Provider Jenkins Rouge MD     Treadmill   MPH 2   Grade 0   Minutes 10   METs 2.53     Recumbant Bike   Level 2   Minutes 10   METs 2     NuStep   Level 2   SPM 70   Minutes 10   METs 2     Prescription Details   Frequency (times per week) 3   Duration Progress to 30 minutes of continuous aerobic without signs/symptoms of physical distress     Intensity   THRR 40-80% of Max Heartrate 53-106   Ratings of  Perceived Exertion 11-13   Perceived Dyspnea 0-4     Progression   Progression Continue to progress workloads to maintain intensity without signs/symptoms of physical distress.  Resistance Training   Training Prescription Yes   Weight 2lbs   Reps 10-15      Perform Capillary Blood Glucose checks as needed.  Exercise Prescription Changes:   Exercise Comments:   Exercise Goals and Review:     Exercise Goals    Row Name 03/19/17 0819             Exercise Goals   Increase Physical Activity Yes       Intervention Provide advice, education, support and counseling about physical activity/exercise needs.;Develop an individualized exercise prescription for aerobic and resistive training based on initial evaluation findings, risk stratification, comorbidities and participant's personal goals.       Expected Outcomes Achievement of increased cardiorespiratory fitness and enhanced flexibility, muscular endurance and strength shown through measurements of functional capacity and personal statement of participant.  improve walking tolerance       Increase Strength and Stamina Yes       Intervention Provide advice, education, support and counseling about physical activity/exercise needs.;Develop an individualized exercise prescription for aerobic and resistive training based on initial evaluation findings, risk stratification, comorbidities and participant's personal goals.       Expected Outcomes Achievement of increased cardiorespiratory fitness and enhanced flexibility, muscular endurance and strength shown through measurements of functional capacity and personal statement of participant.          Exercise Goals Re-Evaluation :    Discharge Exercise Prescription (Final Exercise Prescription Changes):   Nutrition:  Target Goals: Understanding of nutrition guidelines, daily intake of sodium 1500mg , cholesterol 200mg , calories 30% from fat and 7% or less from saturated fats,  daily to have 5 or more servings of fruits and vegetables.  Biometrics:     Pre Biometrics - 03/19/17 1232      Pre Biometrics   Height 5' 7.5" (1.715 m)   Weight 174 lb 6.1 oz (79.1 kg)   Waist Circumference 41 inches   Hip Circumference 40 inches   Waist to Hip Ratio 1.02 %   BMI (Calculated) 27   Triceps Skinfold 18 mm   % Body Fat 29.2 %   Grip Strength 23 kg   Flexibility 9.75 in   Single Leg Stand 1.2 seconds       Nutrition Therapy Plan and Nutrition Goals:     Nutrition Therapy & Goals - 03/19/17 1222      Nutrition Therapy   Diet Therapeutic Lifestyle Changes     Personal Nutrition Goals   Nutrition Goal Pt to identify food quantities necessary to achieve weight loss of 6-10 lb at graduation from cardiac rehab. Goal wt of 160 lb desired.      Intervention Plan   Intervention Prescribe, educate and counsel regarding individualized specific dietary modifications aiming towards targeted core components such as weight, hypertension, lipid management, diabetes, heart failure and other comorbidities.   Expected Outcomes Short Term Goal: Understand basic principles of dietary content, such as calories, fat, sodium, cholesterol and nutrients.;Long Term Goal: Adherence to prescribed nutrition plan.      Nutrition Discharge: Nutrition Scores:     Nutrition Assessments - 03/19/17 1123      MEDFICTS Scores   Pre Score 72      Nutrition Goals Re-Evaluation:   Nutrition Goals Re-Evaluation:   Nutrition Goals Discharge (Final Nutrition Goals Re-Evaluation):   Psychosocial: Target Goals: Acknowledge presence or absence of significant depression and/or stress, maximize coping skills, provide positive support system. Participant is able to verbalize types and ability to use techniques and  skills needed for reducing stress and depression.  Initial Review & Psychosocial Screening:     Initial Psych Review & Screening - 03/19/17 1230      Initial Review    Current issues with None Identified     Family Dynamics   Good Support System? Yes     Barriers   Psychosocial barriers to participate in program There are no identifiable barriers or psychosocial needs.     Screening Interventions   Interventions Encouraged to exercise      Quality of Life Scores:     Quality of Life - 03/19/17 1035      Quality of Life Scores   Health/Function Pre 27.83 %   Socioeconomic Pre 28.21 %   Psych/Spiritual Pre 28.36 %   Family Pre 27.3 %   GLOBAL Pre 27.94 %      PHQ-9: Recent Review Flowsheet Data    Depression screen [facility] 2/9 02/21/2017 07/16/2015   Decreased Interest 0 0   Down, Depressed, Hopeless 0 0   PHQ - 2 Score 0 0     Interpretation of Total Score  Total Score Depression Severity:  1-4 = Minimal depression, 5-9 = Mild depression, 10-14 = Moderate depression, 15-19 = Moderately severe depression, 20-27 = Severe depression   Psychosocial Evaluation and Intervention:   Psychosocial Re-Evaluation:   Psychosocial Discharge (Final Psychosocial Re-Evaluation):   Vocational Rehabilitation: Provide vocational rehab assistance to qualifying candidates.   Vocational Rehab Evaluation & Intervention:     Vocational Rehab - 03/19/17 1231      Initial Vocational Rehab Evaluation & Intervention   Assessment shows need for Vocational Rehabilitation --  Mr Granja is retired      Education: Education Goals: Education classes will be provided on a weekly basis, covering required topics. Participant will state understanding/return demonstration of topics presented.  Learning Barriers/Preferences:     Learning Barriers/Preferences - 03/19/17 0818      Learning Barriers/Preferences   Learning Barriers Sight   Learning Preferences Written Material      Education Topics: Count Your Pulse:  -Group instruction provided by verbal instruction, demonstration, patient participation and written materials to support subject.   Instructors address importance of being able to find your pulse and how to count your pulse when at home without a heart monitor.  Patients get hands on experience counting their pulse with staff help and individually.   Heart Attack, Angina, and Risk Factor Modification:  -Group instruction provided by verbal instruction, video, and written materials to support subject.  Instructors address signs and symptoms of angina and heart attacks.    Also discuss risk factors for heart disease and how to make changes to improve heart health risk factors.   Functional Fitness:  -Group instruction provided by verbal instruction, demonstration, patient participation, and written materials to support subject.  Instructors address safety measures for doing things around the house.  Discuss how to get up and down off the floor, how to pick things up properly, how to safely get out of a chair without assistance, and balance training.   Meditation and Mindfulness:  -Group instruction provided by verbal instruction, patient participation, and written materials to support subject.  Instructor addresses importance of mindfulness and meditation practice to help reduce stress and improve awareness.  Instructor also leads participants through a meditation exercise.    Stretching for Flexibility and Mobility:  -Group instruction provided by verbal instruction, patient participation, and written materials to support subject.  Instructors lead participants through series  of stretches that are designed to increase flexibility thus improving mobility.  These stretches are additional exercise for major muscle groups that are typically performed during regular warm up and cool down.   Hands Only CPR:  -Group verbal, video, and participation provides a basic overview of AHA guidelines for community CPR. Role-play of emergencies allow participants the opportunity to practice calling for help and chest compression technique with  discussion of AED use.   Hypertension: -Group verbal and written instruction that provides a basic overview of hypertension including the most recent diagnostic guidelines, risk factor reduction with self-care instructions and medication management.    Nutrition I class: Heart Healthy Eating:  -Group instruction provided by PowerPoint slides, verbal discussion, and written materials to support subject matter. The instructor gives an explanation and review of the Therapeutic Lifestyle Changes diet recommendations, which includes a discussion on lipid goals, dietary fat, sodium, fiber, plant stanol/sterol esters, sugar, and the components of a well-balanced, healthy diet.   CARDIAC REHAB PHASE II ORIENTATION from 03/19/2017 in [place]  Date  03/19/17  Educator  RD  Instruction Review Code  Not applicable      Nutrition II class: Lifestyle Skills:  -Group instruction provided by PowerPoint slides, verbal discussion, and written materials to support subject matter. The instructor gives an explanation and review of label reading, grocery shopping for heart health, heart healthy recipe modifications, and ways to make healthier choices when eating out.   CARDIAC REHAB PHASE II ORIENTATION from 03/19/2017 in [place]  Date  03/19/17  Educator  RD  Instruction Review Code  Not applicable      Diabetes Question & Answer:  -Group instruction provided by PowerPoint slides, verbal discussion, and written materials to support subject matter. The instructor gives an explanation and review of diabetes co-morbidities, pre- and post-prandial blood glucose goals, pre-exercise blood glucose goals, signs, symptoms, and treatment of hypoglycemia and hyperglycemia, and foot care basics.   Diabetes Blitz:  -Group instruction provided by PowerPoint slides, verbal discussion, and written materials to support subject matter. The instructor gives  an explanation and review of the physiology behind type 1 and type 2 diabetes, diabetes medications and rational behind using different medications, pre- and post-prandial blood glucose recommendations and Hemoglobin A1c goals, diabetes diet, and exercise including blood glucose guidelines for exercising safely.    Portion Distortion:  -Group instruction provided by PowerPoint slides, verbal discussion, written materials, and food models to support subject matter. The instructor gives an explanation of serving size versus portion size, changes in portions sizes over the last 20 years, and what consists of a serving from each food group.   Stress Management:  -Group instruction provided by verbal instruction, video, and written materials to support subject matter.  Instructors review role of stress in heart disease and how to cope with stress positively.     Exercising on Your Own:  -Group instruction provided by verbal instruction, power point, and written materials to support subject.  Instructors discuss benefits of exercise, components of exercise, frequency and intensity of exercise, and end points for exercise.  Also discuss use of nitroglycerin and activating EMS.  Review options of places to exercise outside of rehab.  Review guidelines for sex with heart disease.   Cardiac Drugs I:  -Group instruction provided by verbal instruction and written materials to support subject.  Instructor reviews cardiac drug classes: antiplatelets, anticoagulants, beta blockers, and statins.  Instructor discusses reasons, side effects,  and lifestyle considerations for each drug class.   Cardiac Drugs II:  -Group instruction provided by verbal instruction and written materials to support subject.  Instructor reviews cardiac drug classes: angiotensin converting enzyme inhibitors (ACE-I), angiotensin II receptor blockers (ARBs), nitrates, and calcium channel blockers.  Instructor discusses reasons, side  effects, and lifestyle considerations for each drug class.   Anatomy and Physiology of the Circulatory System:  Group verbal and written instruction and models provide basic cardiac anatomy and physiology, with the coronary electrical and arterial systems. Review of: AMI, Angina, Valve disease, Heart Failure, Peripheral Artery Disease, Cardiac Arrhythmia, Pacemakers, and the ICD.   Other Education:  -Group or individual verbal, written, or video instructions that support the educational goals of the cardiac rehab program.   Knowledge Questionnaire Score:     Knowledge Questionnaire Score - 03/19/17 1035      Knowledge Questionnaire Score   Pre Score 23/24      Core Components/Risk Factors/Patient Goals at Admission:     Personal Goals and Risk Factors at Admission - 03/19/17 1228      Core Components/Risk Factors/Patient Goals on Admission   Hypertension Yes   Intervention Provide education on lifestyle modifcations including regular physical activity/exercise, weight management, moderate sodium restriction and increased consumption of fresh fruit, vegetables, and low fat dairy, alcohol moderation, and smoking cessation.;Monitor prescription use compliance.   Lipids Yes   Intervention Provide education and support for participant on nutrition & aerobic/resistive exercise along with prescribed medications to achieve LDL 70mg , HDL >40mg .   Expected Outcomes Short Term: Participant states understanding of desired cholesterol values and is compliant with medications prescribed. Participant is following exercise prescription and nutrition guidelines.;Long Term: Cholesterol controlled with medications as prescribed, with individualized exercise RX and with personalized nutrition plan. Value goals: LDL < 70mg , HDL > 40 mg.      Core Components/Risk Factors/Patient Goals Review:    Core Components/Risk Factors/Patient Goals at Discharge (Final Review):    ITP Comments:     ITP  Comments    Row Name 03/19/17 0748           ITP Comments Medical Director- Dr. Fransico Him, MD          Comments: [name] attended orientation from 0800 to 1000 to review rules and guidelines for program. Completed 6 minute walk test, Intitial ITP, and exercise prescription.  VSS. Telemetry-Sinus Rhythm with a prolonged first degree heart block. This has been previously  documented. [name] reported feeling slightly woozy during his walk test after his walk test was completed. [name] told me he did not eat breakfast this morning. Patient was given a banana and ginger ale.   Sitting blood pressure  122/62 post walk test. Standing blood pressure 124/78 with a heart rate of 72. [name] had no further complaints or symptoms upon exit from cardiac rehab. [name] was instructed to make sure he eats a meal at least an hour to coming to exercise. Patient states understanding. Exit blood pressure 1112/52. Heart rate 65.Barnet Pall, RN,BSN 03/19/2017 3:18 PM

## 2017-03-27 ENCOUNTER — Encounter (HOSPITAL_COMMUNITY)
Admission: RE | Admit: 2017-03-27 | Discharge: 2017-03-27 | Disposition: A | Discharge status: Home / Self Care | Payer: Medicare Other | Source: Ambulatory Visit | Attending: Cardiovascular Disease | Admitting: Cardiovascular Disease

## 2017-03-27 DIAGNOSIS — Z955 Presence of coronary angioplasty implant and graft: Secondary | ICD-10-CM | POA: Diagnosis not present

## 2017-03-27 DIAGNOSIS — Z48812 Encounter for surgical aftercare following surgery on the circulatory system: Secondary | ICD-10-CM | POA: Diagnosis not present

## 2017-03-28 NOTE — Progress Notes (Addendum)
Daily Session Note  Patient Details  Name: [name] MRN: [id number] Date of Birth: [birth year] Referring Provider:     CARDIAC REHAB PHASE II ORIENTATION from 03/19/2017 in [place]  Referring Provider  Jenkins Rouge MD      Encounter Date: 03/27/2017  Check In:     Session Check In - 03/28/17 1156      Check-In   Location [facility]   Staff Present Cleda Mccreedy, MS, Exercise Physiologist;Carlette Wilber Oliphant, RN, BSN;Joann Rion, RN, Marga Melnick, RN, BSN   Supervising physician immediately available to respond to emergencies [facility] immediately available   Physician(s) Dr Clementeen Graham   Medication changes reported     No   Fall or balance concerns reported    No   Tobacco Cessation No Change   Warm-up and Cool-down Performed as group-led instruction   Resistance Training Performed Yes   VAD Patient? No     Pain Assessment   Currently in Pain? No/denies      Capillary Blood Glucose: No results found for this or any previous visit (from the past 24 hour(s)).    History  Smoking Status  . Never Smoker  Smokeless Tobacco  . Never Used    Goals Met:  Exercise tolerated well  Goals Unmet:  Not Applicable  Comments: [name] started cardiac rehab today.  Pt tolerated light exercise without difficulty. VSS, telemetry-Sinus Rhythm with a first degree heart block  , asymptomatic.  Medication list reconciled. Pt denies barriers to medicaiton compliance.  PSYCHOSOCIAL ASSESSMENT:  PHQ-0. Pt exhibits positive coping skills, hopeful outlook with supportive family. No psychosocial needs identified at this time, no psychosocial interventions necessary.    Pt enjoys staying busy.   Pt oriented to exercise equipment and routine.    Understanding verbalized. Late entry due to epic downtime Barnet Pall, RN,BSN 03/28/2017 12:00 PM   Dr. Fransico Him is Medical Director for Cardiac Rehab at [facility].

## 2017-03-29 ENCOUNTER — Telehealth: Payer: Self-pay | Admitting: Emergency Medicine

## 2017-03-29 ENCOUNTER — Encounter (HOSPITAL_COMMUNITY): Payer: Medicare Other

## 2017-03-29 ENCOUNTER — Ambulatory Visit (INDEPENDENT_AMBULATORY_CARE_PROVIDER_SITE_OTHER): Payer: Medicare Other | Admitting: Emergency Medicine

## 2017-03-29 ENCOUNTER — Telehealth: Payer: Self-pay | Admitting: *Deleted

## 2017-03-29 ENCOUNTER — Encounter: Payer: Self-pay | Admitting: Emergency Medicine

## 2017-03-29 ENCOUNTER — Telehealth (HOSPITAL_COMMUNITY): Payer: Self-pay | Admitting: Family Medicine

## 2017-03-29 VITALS — BP 103/46 | HR 64 | Temp 97.7°F | Resp 16 | Ht 68.25 in | Wt 170.8 lb

## 2017-03-29 DIAGNOSIS — J209 Acute bronchitis, unspecified: Secondary | ICD-10-CM

## 2017-03-29 DIAGNOSIS — J069 Acute upper respiratory infection, unspecified: Secondary | ICD-10-CM | POA: Insufficient documentation

## 2017-03-29 DIAGNOSIS — R059 Cough, unspecified: Secondary | ICD-10-CM | POA: Insufficient documentation

## 2017-03-29 DIAGNOSIS — I6523 Occlusion and stenosis of bilateral carotid arteries: Secondary | ICD-10-CM

## 2017-03-29 DIAGNOSIS — R05 Cough: Secondary | ICD-10-CM

## 2017-03-29 MED ORDER — PROMETHAZINE-DM 6.25-15 MG/5ML PO SYRP: 5.0000 mL | ORAL_SOLUTION | Freq: Four times a day (QID) | ORAL | 0 refills | Status: DC | PRN

## 2017-03-29 MED ORDER — AMOXICILLIN-POT CLAVULANATE 875-125 MG PO TABS: 1.0000 | ORAL_TABLET | Freq: Two times a day (BID) | ORAL | 0 refills | Status: AC

## 2017-03-29 MED ORDER — AMOXICILLIN-POT CLAVULANATE 875-125 MG PO TABS: 1.0000 | ORAL_TABLET | Freq: Two times a day (BID) | ORAL | 0 refills | Status: DC

## 2017-03-29 NOTE — Telephone Encounter (Signed)
Called [facility] left voice mail with Rx amoxicillin-clavulanate(AUGMENTIN), patient is on his way to pick up Rx. Medication was originally sent to his main pharmacy [facility], this will be cancelled.

## 2017-03-29 NOTE — Telephone Encounter (Signed)
Pt is calling to see where his prescriptions went.  I advised him that his antibiotic was called [facility] to [facility] in [place] at around noon today but the syrup [facility].  Pt is requesting that the syrup should also be called in to the [facility] in [place].  Please change.  Pt was seen today

## 2017-03-29 NOTE — Progress Notes (Signed)
[name] 83 y.o.   Chief Complaint  Patient presents with  . Cough    Sx started 5 days ago-nonproductive  . Sore Throat    HISTORY OF PRESENT ILLNESS: This is a 83 y.o. male complaining of cough and sore throat x 5 days..  Cough  This is a new problem. The current episode started in the past 7 days. The problem has been gradually worsening. The problem occurs every few minutes. The cough is productive of sputum. Associated symptoms include a sore throat. Pertinent negatives include no chest pain, chills, eye redness, fever, headaches, hemoptysis, myalgias, nasal congestion, rash, shortness of breath, weight loss or wheezing. He has tried OTC cough suppressant for the symptoms. The treatment provided no relief.  Sore Throat   Associated symptoms include congestion and coughing. Pertinent negatives include no abdominal pain, diarrhea, headaches, neck pain, shortness of breath or vomiting.     Prior to Admission medications   Medication Sig Start Date End Date Taking? Authorizing Provider  acetaminophen (TYLENOL) 500 MG tablet Take 1 tablet (500 mg total) by mouth every 6 (six) hours as needed for moderate pain (wrist). 02/28/17  Yes Reino Bellis B, NP  amLODipine (NORVASC) 10 MG tablet Take 1 tablet (10 mg total) by mouth daily. 10/26/16  Yes Josue Hector, MD  aspirin EC 81 MG EC tablet Take 1 tablet (81 mg total) by mouth daily. 01/12/16  Yes Reino Bellis B, NP  b complex vitamins tablet Take 1 tablet by mouth every other day.    Yes [provider]  Calcium Carbonate (CALCIUM 500 PO) Take 1 tablet by mouth daily.   Yes [provider]  Cholecalciferol (VITAMIN D-3) 1000 units CAPS Take 1 capsule by mouth daily.   Yes [provider]  clopidogrel (PLAVIX) 75 MG tablet Take 1 tablet (75 mg total) by mouth daily with breakfast. 02/21/17  Yes Eileen Stanford, PA-C  colchicine 0.6 MG tablet Take 0.6 mg by mouth daily.   Yes [provider]    Glucosamine 500 MG CAPS Take 1 capsule by mouth daily.    Yes [provider]  isosorbide mononitrate (IMDUR) 60 MG 24 hr tablet Take 1.5 tablets (90 mg total) by mouth daily. 02/28/17  Yes Reino Bellis B, NP  lisinopril (PRINIVIL,ZESTRIL) 40 MG tablet Take 40 mg by mouth daily. 01/24/17  Yes [provider]  Multiple Vitamin (MULTIVITAMIN) capsule Take 1 capsule by mouth daily.   Yes [provider]  nitroGLYCERIN (NITROSTAT) 0.4 MG SL tablet place 1 tablet under the tongue if needed every 5 minutes fo 02/28/17  Yes Skains, Thana Farr, MD  Omega-3 Fatty Acids (FISH OIL) 1000 MG CAPS Take 1 capsule by mouth daily.   Yes [provider]  OVER THE COUNTER MEDICATION 600 mg as needed.   Yes [provider]  ranolazine (RANEXA) 1000 MG SR tablet Take 1 tablet (1,000 mg total) by mouth 2 (two) times daily. 02/28/17  Yes Reino Bellis B, NP  simvastatin (ZOCOR) 20 MG tablet Take 1 tablet (20 mg total) by mouth daily. 10/26/16  Yes Josue Hector, MD  vitamin C (ASCORBIC ACID) 500 MG tablet Take 500 mg by mouth daily.     Yes [provider]    No Known Allergies  Patient Active Problem List   Diagnosis Date Noted  . Presence of drug coated stent in RCA & Ramus Intermedius. 02/28/2017  . Gout flare 02/22/2017  . Coronary artery disease involving native coronary  artery of native heart with angina pectoris ([place])   . Carotid artery disease ([place])   . HLD (hyperlipidemia)   . Unstable angina ([place]) 02/20/2017  . Atherosclerosis of native coronary artery of native heart   . Essential hypertension 01/06/2009  . Second degree atrioventricular block, Mobitz type I 01/06/2009    Past Medical History:  Diagnosis Date  . AV BLOCK, 1ST DEGREE   . Carotid artery disease ([place])    a. s/p Left ECA followed by Dr. Donnetta Hutching  . Coronary artery disease   . Coronary artery disease involving coronary bypass graft of native heart with angina pectoris ([place])    a.  LHC 12/2015  3vd Left Cx 100% with colaterals, and distally LAD occluded, DES to prox-mid RCA  b. 02/2017: repeat cath with patent RCA stent and stable CAD; 02/27/2017: Rota-PCI to Ost RI  . GERD (gastroesophageal reflux disease)   . HLD (hyperlipidemia)   . HTN (hypertension)   . NSVT (nonsustained ventricular tachycardia) ([place])    during stress test 12/2015  . Presence of drug coated stent in RCA & Ramus Intermedius. 02/28/2017   12/2015: PCI p-mRCA - Stent Resolute Integ 3.5x34 02/2017: Rota-PCI o-mRamus - overlapping DES STENT SYNERGY DES 3X32  & 3x24 (tapered post-dilation)  . Stroke [facility])     Past Surgical History:  Procedure Laterality Date  . CARDIAC CATHETERIZATION     x2  . CARDIAC CATHETERIZATION N/A 01/11/2016   Procedure: Left Heart Cath and Coronary Angiography;  Surgeon: Wellington Hampshire, MD;  Location: [facility];  Service: Cardiovascular;  Laterality: N/A;  . CAROTID ENDARTERECTOMY  11/28/2006   left  . CORONARY ATHERECTOMY N/A 02/27/2017   Procedure: Coronary Atherectomy;  Surgeon: Troy Sine, MD;  Location: [facility];  Service: Cardiovascular;  Laterality: N/A;  . HERNIA REPAIR    . LEFT HEART CATH AND CORONARY ANGIOGRAPHY N/A 02/21/2017   Procedure: Left Heart Cath and Coronary Angiography;  Surgeon: Lorretta Harp, MD;  Location: [facility];  Service: Cardiovascular;  Laterality: N/A;  . TONSILLECTOMY      Social History   Social History  . Marital status: Married    Spouse name: N/A  . Number of children: N/A  . Years of education: N/A   Occupational History  . Not on file.   Social History Main Topics  . Smoking status: Never Smoker  . Smokeless tobacco: Never Used  . Alcohol use 9.0 oz/week    15 Glasses of wine per week  . Drug use: No  . Sexual activity: Not on file   Other Topics Concern  . Not on file   Social History Narrative  . No narrative on file    Family History  Problem Relation Age of Onset  . Diabetes  Mother   . Diabetes Sister   . Cancer Sister 32       colon  . Heart disease Sister      Review of Systems  Constitutional: Negative for chills, fever and weight loss.  HENT: Positive for congestion and sore throat. Negative for nosebleeds.   Eyes: Negative for blurred vision, double vision, discharge and redness.  Respiratory: Positive for cough. Negative for hemoptysis, shortness of breath and wheezing.   Cardiovascular: Negative for chest pain and palpitations.  Gastrointestinal: Negative for abdominal pain, diarrhea, nausea and vomiting.  Genitourinary: Negative for dysuria and hematuria.  Musculoskeletal: Negative for back pain, myalgias and neck pain.  Skin: Negative for rash.  Neurological: Negative for headaches.  Endo/Heme/Allergies: Negative.   All other systems reviewed and are negative.  Vitals:   03/29/17 1118  BP: (!) 103/46  Pulse: 64  Resp: 16  Temp: 97.7 F (36.5 C)  SpO2: 96%     Physical Exam  Constitutional: He is oriented to person, place, and time. He appears well-developed and well-nourished.  HENT:  Head: Normocephalic and atraumatic.  Mouth/Throat: Posterior oropharyngeal erythema present. No oropharyngeal exudate or posterior oropharyngeal edema.  Eyes: Pupils are equal, round, and reactive to light. Conjunctivae and EOM are normal.  Neck: Normal range of motion. Neck supple. No JVD present.  Cardiovascular: Normal rate, regular rhythm, normal heart sounds and intact distal pulses.   Pulmonary/Chest: Effort normal and breath sounds normal.  Abdominal: Soft. Bowel sounds are normal. He exhibits no distension. There is no tenderness.  Musculoskeletal: Normal range of motion.  Lymphadenopathy:    He has no cervical adenopathy.  Neurological: He is alert and oriented to person, place, and time. No sensory deficit. He exhibits normal muscle tone.  Skin: Skin is warm and dry. Capillary refill takes less than 2 seconds. No rash noted.  Psychiatric:  He has a normal mood and affect. His behavior is normal.  Vitals reviewed.    ASSESSMENT & PLAN: [name] was seen today for cough and sore throat.  Diagnoses and all orders for this visit:  Acute bronchitis, unspecified organism  Cough  Acute upper respiratory infection  Other orders -     amoxicillin-clavulanate (AUGMENTIN) 875-125 MG tablet; Take 1 tablet by mouth 2 (two) times daily. -     promethazine-dextromethorphan (PROMETHAZINE-DM) 6.25-15 MG/5ML syrup; Take 5 mLs by mouth 4 (four) times daily as needed for cough.    Patient Instructions       IF you received an x-ray today, you will receive an invoice from [facility]. Please contact [facility] at [phone] with questions or concerns regarding your invoice.   IF you received labwork today, you will receive an invoice from [facility]. Please contact [facility] at [phone] with questions or concerns regarding your invoice.   Our billing staff will not be able to assist you with questions regarding bills from these companies.  You will be contacted with the lab results as soon as they are available. The fastest way to get your results is to activate your My Chart account. Instructions are located on the last page of this paperwork. If you have not heard from [place] regarding the results in 2 weeks, please contact this office.     Upper Respiratory Infection, Adult Most upper respiratory infections (URIs) are caused by a virus. A URI affects the nose, throat, and upper air passages. The most common type of URI is often called "the common cold." Follow these instructions at home:  Take medicines only as told by your doctor.  Gargle warm saltwater or take cough drops to comfort your throat as told by your doctor.  Use a warm mist humidifier or inhale steam from a shower to increase air moisture. This may make it easier to breathe.  Drink enough fluid to keep your pee (urine) clear or pale  yellow.  Eat soups and other clear broths.  Have a healthy diet.  Rest as needed.  Go back to work when your fever is gone or your doctor says it is okay. ? You may need to stay home longer to avoid giving your URI to others. ? You can also wear a face mask and wash  your hands often to prevent spread of the virus.  Use your inhaler more if you have asthma.  Do not use any tobacco products, including cigarettes, chewing tobacco, or electronic cigarettes. If you need help quitting, ask your doctor. Contact a doctor if:  You are getting worse, not better.  Your symptoms are not helped by medicine.  You have chills.  You are getting more short of breath.  You have brown or red mucus.  You have yellow or brown discharge from your nose.  You have pain in your face, especially when you bend forward.  You have a fever.  You have puffy (swollen) neck glands.  You have pain while swallowing.  You have white areas in the back of your throat. Get help right away if:  You have very bad or constant: ? Headache. ? Ear pain. ? Pain in your forehead, behind your eyes, and over your cheekbones (sinus pain). ? Chest pain.  You have long-lasting (chronic) lung disease and any of the following: ? Wheezing. ? Long-lasting cough. ? Coughing up blood. ? A change in your usual mucus.  You have a stiff neck.  You have changes in your: ? Vision. ? Hearing. ? Thinking. ? Mood. This information is not intended to replace advice given to you by your health care provider. Make sure you discuss any questions you have with your health care provider. Document Released: 01/23/2008 Document Revised: 04/08/2016 Document Reviewed: 11/11/2013 Elsevier Interactive Patient Education  2018 Elsevier Inc.      Agustina Caroli, MD [facility]

## 2017-03-29 NOTE — Patient Instructions (Addendum)
     IF you received an x-ray today, you will receive an invoice from [facility]. Please contact [facility] at [phone] with questions or concerns regarding your invoice.   IF you received labwork today, you will receive an invoice from [facility]. Please contact [facility] at [phone] with questions or concerns regarding your invoice.   Our billing staff will not be able to assist you with questions regarding bills from these companies.  You will be contacted with the lab results as soon as they are available. The fastest way to get your results is to activate your My Chart account. Instructions are located on the last page of this paperwork. If you have not heard from us regarding the results in 2 weeks, please contact this office.     Upper Respiratory Infection, Adult Most upper respiratory infections (URIs) are caused by a virus. A URI affects the nose, throat, and upper air passages. The most common type of URI is often called "the common cold." Follow these instructions at home:  Take medicines only as told by your doctor.  Gargle warm saltwater or take cough drops to comfort your throat as told by your doctor.  Use a warm mist humidifier or inhale steam from a shower to increase air moisture. This may make it easier to breathe.  Drink enough fluid to keep your pee (urine) clear or pale yellow.  Eat soups and other clear broths.  Have a healthy diet.  Rest as needed.  Go back to work when your fever is gone or your doctor says it is okay. ? You may need to stay home longer to avoid giving your URI to others. ? You can also wear a face mask and wash your hands often to prevent spread of the virus.  Use your inhaler more if you have asthma.  Do not use any tobacco products, including cigarettes, chewing tobacco, or electronic cigarettes. If you need help quitting, ask your doctor. Contact a doctor if:  You are getting worse, not better.  Your  symptoms are not helped by medicine.  You have chills.  You are getting more short of breath.  You have brown or red mucus.  You have yellow or brown discharge from your nose.  You have pain in your face, especially when you bend forward.  You have a fever.  You have puffy (swollen) neck glands.  You have pain while swallowing.  You have white areas in the back of your throat. Get help right away if:  You have very bad or constant: ? Headache. ? Ear pain. ? Pain in your forehead, behind your eyes, and over your cheekbones (sinus pain). ? Chest pain.  You have long-lasting (chronic) lung disease and any of the following: ? Wheezing. ? Long-lasting cough. ? Coughing up blood. ? A change in your usual mucus.  You have a stiff neck.  You have changes in your: ? Vision. ? Hearing. ? Thinking. ? Mood. This information is not intended to replace advice given to you by your health care provider. Make sure you discuss any questions you have with your health care provider. Document Released: 01/23/2008 Document Revised: 04/08/2016 Document Reviewed: 11/11/2013 Elsevier Interactive Patient Education  2018 Elsevier Inc.  

## 2017-04-01 ENCOUNTER — Encounter (HOSPITAL_COMMUNITY)
Admission: RE | Admit: 2017-04-01 | Discharge: 2017-04-01 | Disposition: A | Discharge status: Home / Self Care | Payer: Medicare Other | Source: Ambulatory Visit | Attending: Cardiovascular Disease | Admitting: Cardiovascular Disease

## 2017-04-01 ENCOUNTER — Other Ambulatory Visit: Payer: Self-pay | Admitting: Emergency Medicine

## 2017-04-01 DIAGNOSIS — Z955 Presence of coronary angioplasty implant and graft: Secondary | ICD-10-CM

## 2017-04-01 DIAGNOSIS — Z9889 Other specified postprocedural states: Secondary | ICD-10-CM

## 2017-04-01 MED ORDER — PROMETHAZINE-DM 6.25-15 MG/5ML PO SYRP: 5.0000 mL | ORAL_SOLUTION | Freq: Four times a day (QID) | ORAL | 0 refills | Status: DC | PRN

## 2017-04-01 NOTE — Progress Notes (Signed)
Incomplete Session Note  Patient Details  Name: [name] MRN: [id number] Date of Birth: 13-Jun-1928 Referring Provider:     CARDIAC REHAB PHASE II ORIENTATION from 03/19/2017 in [place]  Referring Provider  Jenkins Rouge MD      [name] did not complete his rehab session.  [name] was noted to have a nickel sized hematoma on the lower center of his forehead and a small scratch on his nose. [name] says he was sitting on the side of the bed spitting in a trash can when he found himself on the the floor. [name] denied having any chest pain or dizziness at the time of the incident or today. Blood pressure 117/70. Heart rate 64. Oxygen saturation 95% on room air. [name]'s primary care physician's office called and notified. Appointment made for [name] to follow up with Bradley Lords FNP tomorrow  At 3:45pm. [name] did not exercise today. Barnet Pall, RN,BSN 04/01/2017 4:55 PM

## 2017-04-01 NOTE — Telephone Encounter (Signed)
Wife informed cough syrup sent to [facility] per request.

## 2017-04-03 ENCOUNTER — Encounter (HOSPITAL_COMMUNITY)
Admission: RE | Admit: 2017-04-03 | Discharge: 2017-04-03 | Disposition: A | Discharge status: Home / Self Care | Payer: Medicare Other | Source: Ambulatory Visit | Attending: Cardiovascular Disease | Admitting: Cardiovascular Disease

## 2017-04-03 DIAGNOSIS — Z9889 Other specified postprocedural states: Secondary | ICD-10-CM

## 2017-04-03 DIAGNOSIS — Z48812 Encounter for surgical aftercare following surgery on the circulatory system: Secondary | ICD-10-CM | POA: Diagnosis not present

## 2017-04-03 DIAGNOSIS — Z955 Presence of coronary angioplasty implant and graft: Secondary | ICD-10-CM

## 2017-04-04 ENCOUNTER — Encounter: Payer: Self-pay | Admitting: Emergency Medicine

## 2017-04-04 ENCOUNTER — Ambulatory Visit (INDEPENDENT_AMBULATORY_CARE_PROVIDER_SITE_OTHER): Payer: Medicare Other

## 2017-04-04 ENCOUNTER — Ambulatory Visit (INDEPENDENT_AMBULATORY_CARE_PROVIDER_SITE_OTHER): Payer: Medicare Other | Admitting: Emergency Medicine

## 2017-04-04 VITALS — BP 105/48 | HR 72 | Temp 98.0°F | Resp 18 | Ht 68.25 in | Wt 168.6 lb

## 2017-04-04 DIAGNOSIS — R05 Cough: Secondary | ICD-10-CM | POA: Diagnosis not present

## 2017-04-04 DIAGNOSIS — R059 Cough, unspecified: Secondary | ICD-10-CM

## 2017-04-04 DIAGNOSIS — I6523 Occlusion and stenosis of bilateral carotid arteries: Secondary | ICD-10-CM | POA: Diagnosis not present

## 2017-04-04 DIAGNOSIS — J069 Acute upper respiratory infection, unspecified: Secondary | ICD-10-CM

## 2017-04-04 NOTE — Progress Notes (Signed)
[name] 83 y.o.   Chief Complaint  Patient presents with  . URI    pt states he is feeling better but he still coughing and having a choking sensation.  . Follow-up    HISTORY OF PRESENT ILLNESS: This is a 83 y.o. male complaining of persistent cough. Seen by me 03/29/17 and started on Augmentin.  Cough  This is a new problem. The current episode started 1 to 4 weeks ago. The problem has been waxing and waning. The problem occurs hourly. The cough is productive of sputum (clear phlegm). Pertinent negatives include no chest pain, chills, eye redness, fever, headaches, hemoptysis, myalgias, nasal congestion, rash, sore throat, shortness of breath or wheezing.     Prior to Admission medications   Medication Sig Start Date End Date Taking? Authorizing Provider  acetaminophen (TYLENOL) 500 MG tablet Take 1 tablet (500 mg total) by mouth every 6 (six) hours as needed for moderate pain (wrist). 02/28/17  Yes Reino Bellis B, NP  amLODipine (NORVASC) 10 MG tablet Take 1 tablet (10 mg total) by mouth daily. 10/26/16  Yes Josue Hector, MD  amoxicillin-clavulanate (AUGMENTIN) 875-125 MG tablet Take 1 tablet by mouth 2 (two) times daily. 03/29/17 04/05/17 Yes SagardiaInes Bloomer, MD  aspirin EC 81 MG EC tablet Take 1 tablet (81 mg total) by mouth daily. 01/12/16  Yes Reino Bellis B, NP  b complex vitamins tablet Take 1 tablet by mouth every other day.    Yes [provider]  Calcium Carbonate (CALCIUM 500 PO) Take 1 tablet by mouth daily.   Yes [provider]  Cholecalciferol (VITAMIN D-3) 1000 units CAPS Take 1 capsule by mouth daily.   Yes [provider]  clopidogrel (PLAVIX) 75 MG tablet Take 1 tablet (75 mg total) by mouth daily with breakfast. 02/21/17  Yes Eileen Stanford, PA-C  colchicine 0.6 MG tablet Take 0.6 mg by mouth daily.   Yes [provider]  Glucosamine 500 MG CAPS Take 1 capsule by mouth daily.    Yes [provider]    isosorbide mononitrate (IMDUR) 60 MG 24 hr tablet Take 1.5 tablets (90 mg total) by mouth daily. 02/28/17  Yes Reino Bellis B, NP  lisinopril (PRINIVIL,ZESTRIL) 40 MG tablet Take 40 mg by mouth daily. 01/24/17  Yes [provider]  Multiple Vitamin (MULTIVITAMIN) capsule Take 1 capsule by mouth daily.   Yes [provider]  nitroGLYCERIN (NITROSTAT) 0.4 MG SL tablet place 1 tablet under the tongue if needed every 5 minutes fo 02/28/17  Yes Skains, Thana Farr, MD  Omega-3 Fatty Acids (FISH OIL) 1000 MG CAPS Take 1 capsule by mouth daily.   Yes [provider]  OVER THE COUNTER MEDICATION 600 mg as needed.   Yes [provider]  promethazine-dextromethorphan (PROMETHAZINE-DM) 6.25-15 MG/5ML syrup Take 5 mLs by mouth 4 (four) times daily as needed for cough. 04/01/17  Yes Rodrick Payson, Ines Bloomer, MD  ranolazine (RANEXA) 1000 MG SR tablet Take 1 tablet (1,000 mg total) by mouth 2 (two) times daily. 02/28/17  Yes Reino Bellis B, NP  simvastatin (ZOCOR) 20 MG tablet Take 1 tablet (20 mg total) by mouth daily. 10/26/16  Yes Josue Hector, MD  vitamin C (ASCORBIC ACID) 500 MG tablet Take 500 mg by mouth daily.     Yes [provider]    No Known Allergies  Patient Active Problem List   Diagnosis Date Noted  . Cough 03/29/2017  . Acute bronchitis 03/29/2017  .  Acute upper respiratory infection 03/29/2017  . Presence of drug coated stent in RCA & Ramus Intermedius. 02/28/2017  . Gout flare 02/22/2017  . Coronary artery disease involving native coronary artery of native heart with angina pectoris ([place])   . Carotid artery disease ([place])   . HLD (hyperlipidemia)   . Unstable angina ([place]) 02/20/2017  . Atherosclerosis of native coronary artery of native heart   . Essential hypertension 01/06/2009  . Second degree atrioventricular block, Mobitz type I 01/06/2009    Past Medical History:  Diagnosis Date  . AV BLOCK, 1ST DEGREE   . Carotid artery disease  ([place])    a. s/p Left ECA followed by Dr. Donnetta Hutching  . Coronary artery disease   . Coronary artery disease involving coronary bypass graft of native heart with angina pectoris ([place])    a. LHC 12/2015  3vd Left Cx 100% with colaterals, and distally LAD occluded, DES to prox-mid RCA  b. 02/2017: repeat cath with patent RCA stent and stable CAD; 02/27/2017: Rota-PCI to Ost RI  . GERD (gastroesophageal reflux disease)   . HLD (hyperlipidemia)   . HTN (hypertension)   . NSVT (nonsustained ventricular tachycardia) ([place])    during stress test 12/2015  . Presence of drug coated stent in RCA & Ramus Intermedius. 02/28/2017   12/2015: PCI p-mRCA - Stent Resolute Integ 3.5x34 02/2017: Rota-PCI o-mRamus - overlapping DES STENT SYNERGY DES 3X32  & 3x24 (tapered post-dilation)  . [facility]roke [facility])     Past Surgical History:  Procedure Laterality Date  . CARDIAC CATHETERIZATION     x2  . CARDIAC CATHETERIZATION N/A 01/11/2016   Procedure: Left Heart Cath and Coronary Angiography;  Surgeon: Wellington Hampshire, MD;  Location: [facility];  Service: Cardiovascular;  Laterality: N/A;  . CAROTID ENDARTERECTOMY  11/28/2006   left  . CORONARY ATHERECTOMY N/A 02/27/2017   Procedure: Coronary Atherectomy;  Surgeon: Troy Sine, MD;  Location: [place] [facility];  Service: Cardiovascular;  Laterality: N/A;  . HERNIA REPAIR    . LEFT HEART CATH AND CORONARY ANGIOGRAPHY N/A 02/21/2017   Procedure: Left Heart Cath and Coronary Angiography;  Surgeon: Lorretta Harp, MD;  Location: [facility];  Service: Cardiovascular;  Laterality: N/A;  . TONSILLECTOMY      Social History   Social History  . Marital status: Married    Spouse name: N/A  . Number of children: N/A  . Years of education: N/A   Occupational History  . Not on file.   Social History Main Topics  . Smoking status: Never Smoker  . Smokeless tobacco: Never Used  . Alcohol use 9.0 oz/week    15 Glasses of wine per week  . Drug use: No  .  Sexual activity: Not on file   Other Topics Concern  . Not on file   Social History Narrative  . No narrative on file    Family History  Problem Relation Age of Onset  . Diabetes Mother   . Diabetes Sister   . Cancer Sister 62       colon  . Heart disease Sister      Review of Systems  Constitutional: Negative.  Negative for chills and fever.  HENT: Negative.  Negative for congestion, nosebleeds and sore throat.   Eyes: Negative.  Negative for discharge and redness.  Respiratory: Positive for cough. Negative for hemoptysis, shortness of breath and wheezing.   Cardiovascular: Negative.  Negative for chest pain, palpitations and leg swelling.  Gastrointestinal: Negative.  Negative for abdominal pain, diarrhea, nausea and vomiting.  Genitourinary: Negative for dysuria and hematuria.  Musculoskeletal: Negative for myalgias and neck pain.  Skin: Negative.  Negative for rash.  Neurological: Negative for dizziness and headaches.  Endo/Heme/Allergies: Negative.   All other systems reviewed and are negative.   Vitals:   04/04/17 1550  BP: (!) 105/48  Pulse: 72  Resp: 18  Temp: 98 F (36.7 C)  SpO2: 96%    Physical Exam  Constitutional: He is oriented to person, place, and time. He appears well-developed and well-nourished.  HENT:  Head: Normocephalic and atraumatic.  Eyes: Pupils are equal, round, and reactive to light. Conjunctivae and EOM are normal.  Neck: Normal range of motion. Neck supple.  Cardiovascular: Normal rate, regular rhythm and normal heart sounds.   +thrill right radial pulse  Pulmonary/Chest: Effort normal and breath sounds normal.  Abdominal: Soft. There is no tenderness.  Musculoskeletal: Normal range of motion.  Neurological: He is alert and oriented to person, place, and time. No sensory deficit. He exhibits normal muscle tone.  Skin: Skin is warm and dry. Capillary refill takes less than 2 seconds. No rash noted.  Psychiatric: He has a normal  mood and affect. His behavior is normal.  Vitals reviewed.  Dg Chest 2 View  Result Date: 04/04/2017 CLINICAL DATA:  Cough EXAM: CHEST  2 VIEW COMPARISON:  None. FINDINGS: The lungs appear somewhat hyperaerated suggesting possible emphysema. Minimally prominent markings at the right lung base may represent scarring or linear atelectasis. No definite pneumonia or pleural effusion is seen. Mediastinal and hilar contours are unremarkable. The heart is mildly enlarged. Thoracic aortic atherosclerosis is noted. There are degenerative changes throughout the thoracic spine. IMPRESSION: 1. Mild basilar linear atelectasis or scarring. No pneumonia or effusion. 2. Hyper aeration may indicate an element of emphysema. Electronically Signed   By: Ivar Drape M.D.   On: 04/04/2017 16:21     ASSESSMENT & PLAN: [place] was seen today for uri and follow-up.  Diagnoses and all orders for this visit:  Cough -     DG Chest 2 View; Future  Acute upper respiratory infection Comments: improving    Patient Instructions   Continue and finish Augmentin. Continue cough elixir as prescribed during last visit.    IF you received an x-ray today, you will receive an invoice from [facility]. Please contact [facility] at [phone] with questions or concerns regarding your invoice.   IF you received labwork today, you will receive an invoice from [facility]. Please contact [facility] at [phone] with questions or concerns regarding your invoice.   Our billing staff will not be able to assist you with questions regarding bills from these companies.  You will be contacted with the lab results as soon as they are available. The fastest way to get your results is to activate your My Chart account. Instructions are located on the last page of this paperwork. If you have not heard from [place] regarding the results in 2 weeks, please contact this office.     Cough, Adult A cough helps to clear your  throat and lungs. A cough may last only 2-3 weeks (acute), or it may last longer than 8 weeks (chronic). Many different things can cause a cough. A cough may be a sign of an illness or another medical condition. Follow these instructions at home:  Pay attention to any changes in your cough.  Take medicines only as told by your doctor. ? If  you were prescribed an antibiotic medicine, take it as told by your doctor. Do not stop taking it even if you start to feel better. ? Talk with your doctor before you try using a cough medicine.  Drink enough fluid to keep your pee (urine) clear or pale yellow.  If the air is dry, use a cold steam vaporizer or humidifier in your home.  Stay away from things that make you cough at work or at home.  If your cough is worse at night, try using extra pillows to raise your head up higher while you sleep.  Do not smoke, and try not to be around smoke. If you need help quitting, ask your doctor.  Do not have caffeine.  Do not drink alcohol.  Rest as needed. Contact a doctor if:  You have new problems (symptoms).  You cough up yellow fluid (pus).  Your cough does not get better after 2-3 weeks, or your cough gets worse.  Medicine does not help your cough and you are not sleeping well.  You have pain that gets worse or pain that is not helped with medicine.  You have a fever.  You are losing weight and you do not know why.  You have night sweats. Get help right away if:  You cough up blood.  You have trouble breathing.  Your heartbeat is very fast. This information is not intended to replace advice given to you by your health care provider. Make sure you discuss any questions you have with your health care provider. Document Released: 04/19/2011 Document Revised: 01/12/2016 Document Reviewed: 10/13/2014 Elsevier Interactive Patient Education  2018 [employer]      Agustina Caroli, MD [facility]

## 2017-04-04 NOTE — Progress Notes (Signed)
Cardiac Individual Treatment Plan  Patient Details  Name: Allenmichael Mcpartlin MRN: [id number] Date of Birth: 1928/01/15 Referring Provider:     CARDIAC REHAB PHASE II ORIENTATION from 03/19/2017 in [place]  Referring Provider  Jenkins Rouge MD      Initial Encounter Date:    CARDIAC REHAB PHASE II ORIENTATION from 03/19/2017 in [place]  Date  03/19/17  Referring Provider  Jenkins Rouge MD      Visit Diagnosis: 02/27/17 DES x2  02/27/17 atherectomy  Patient's Home Medications on Admission:  Current Outpatient Prescriptions:  .  acetaminophen (TYLENOL) 500 MG tablet, Take 1 tablet (500 mg total) by mouth every 6 (six) hours as needed for moderate pain (wrist)., Disp: 30 tablet, Rfl: 0 .  amLODipine (NORVASC) 10 MG tablet, Take 1 tablet (10 mg total) by mouth daily., Disp: 90 tablet, Rfl: 3 .  amoxicillin-clavulanate (AUGMENTIN) 875-125 MG tablet, Take 1 tablet by mouth 2 (two) times daily., Disp: 14 tablet, Rfl: 0 .  aspirin EC 81 MG EC tablet, Take 1 tablet (81 mg total) by mouth daily., Disp: , Rfl:  .  b complex vitamins tablet, Take 1 tablet by mouth every other day. , Disp: , Rfl:  .  Calcium Carbonate (CALCIUM 500 PO), Take 1 tablet by mouth daily., Disp: , Rfl:  .  Cholecalciferol (VITAMIN D-3) 1000 units CAPS, Take 1 capsule by mouth daily., Disp: , Rfl:  .  clopidogrel (PLAVIX) 75 MG tablet, Take 1 tablet (75 mg total) by mouth daily with breakfast., Disp: 90 tablet, Rfl: 2 .  colchicine 0.6 MG tablet, Take 0.6 mg by mouth daily., Disp: , Rfl:  .  Glucosamine 500 MG CAPS, Take 1 capsule by mouth daily. , Disp: , Rfl:  .  isosorbide mononitrate (IMDUR) 60 MG 24 hr tablet, Take 1.5 tablets (90 mg total) by mouth daily., Disp: 60 tablet, Rfl: 2 .  lisinopril (PRINIVIL,ZESTRIL) 40 MG tablet, Take 40 mg by mouth daily., Disp: , Rfl:  .  Multiple Vitamin (MULTIVITAMIN) capsule, Take 1 capsule by mouth daily., Disp: , Rfl:   .  nitroGLYCERIN (NITROSTAT) 0.4 MG SL tablet, place 1 tablet under the tongue if needed every 5 minutes fo, Disp: 25 tablet, Rfl: 3 .  Omega-3 Fatty Acids (FISH OIL) 1000 MG CAPS, Take 1 capsule by mouth daily., Disp: , Rfl:  .  OVER THE COUNTER MEDICATION, 600 mg as needed., Disp: , Rfl:  .  promethazine-dextromethorphan (PROMETHAZINE-DM) 6.25-15 MG/5ML syrup, Take 5 mLs by mouth 4 (four) times daily as needed for cough., Disp: 118 mL, Rfl: 0 .  ranolazine (RANEXA) 1000 MG SR tablet, Take 1 tablet (1,000 mg total) by mouth 2 (two) times daily., Disp: 60 tablet, Rfl: 2 .  simvastatin (ZOCOR) 20 MG tablet, Take 1 tablet (20 mg total) by mouth daily., Disp: 90 tablet, Rfl: 3 .  vitamin C (ASCORBIC ACID) 500 MG tablet, Take 500 mg by mouth daily.  , Disp: , Rfl:   Past Medical History: Past Medical History:  Diagnosis Date  . AV BLOCK, 1ST DEGREE   . Carotid artery disease ([place])    a. s/p Left ECA followed by Dr. Donnetta Hutching  . Coronary artery disease   . Coronary artery disease involving coronary bypass graft of native heart with angina pectoris ([place])    a. LHC 12/2015  3vd Left Cx 100% with colaterals, and distally LAD occluded, DES to prox-mid RCA  b. 02/2017: repeat cath with patent  RCA stent and stable CAD; 02/27/2017: Rota-PCI to [place]  . GERD (gastroesophageal reflux disease)   . HLD (hyperlipidemia)   . HTN (hypertension)   . NSVT (nonsustained ventricular tachycardia) ([place])    during stress test 12/2015  . Presence of drug coated stent in RCA & Ramus Intermedius. 02/28/2017   12/2015: PCI p-mRCA - Stent Resolute Integ 3.5x34 02/2017: Rota-PCI o-mRamus - overlapping DES STENT SYNERGY DES 3X32  & 3x24 (tapered post-dilation)  . Stroke [facility])     Tobacco Use: History  Smoking Status  . Never Smoker  Smokeless Tobacco  . Never Used    Labs: Recent Review Flowsheet Data    Labs for ITP Cardiac and Pulmonary Rehab Latest Ref Rng & Units 05/24/2010 01/11/2016 01/12/2016 02/21/2017   Cholestrol  0 - 200 mg/dL 139 - 120 109   LDLCALC 0 - 99 mg/dL 70 - 48 53   HDL >40 mg/dL 51.30 - 52 46   Trlycerides <150 mg/dL 87.0 - 98 52   Hemoglobin A1c 4.8 - 5.6 % - 6.0(H) - -      Capillary Blood Glucose: No results found for: GLUCAP   Exercise Target Goals:    Exercise Program Goal: Individual exercise prescription set with THRR, safety & activity barriers. Participant demonstrates ability to understand and report RPE using BORG scale, to self-measure pulse accurately, and to acknowledge the importance of the exercise prescription.  Exercise Prescription Goal: Starting with aerobic activity 30 plus minutes a day, 3 days per week for initial exercise prescription. Provide home exercise prescription and guidelines that participant acknowledges understanding prior to discharge.  Activity Barriers & Risk Stratification:     Activity Barriers & Cardiac Risk Stratification - 03/19/17 0819      Activity Barriers & Cardiac Risk Stratification   Activity Barriers Arthritis;Deconditioning;Muscular Weakness;Balance Concerns   Cardiac Risk Stratification High      6 Minute Walk:     6 Minute Walk    Row Name 03/19/17 1038         6 Minute Walk   Phase Initial     Distance 12173 feet     Walk Time 6 minutes     # of Rest Breaks 0     MPH 2.42     METS 1.66     RPE 13     VO2 Peak 5.8     Symptoms Yes (comment)     Comments pt c/o "woozy/dizzy" with walk test. Pt did not eat breakfast, given light snack (banana/PB crackers, ginger ale) symptoms resolved.     Resting HR 59 bpm     Resting BP 118/60     Max Ex. HR 72 bpm     Max Ex. BP 124/72     2 Minute Post BP 112/52        Oxygen Initial Assessment:   Oxygen Re-Evaluation:   Oxygen Discharge (Final Oxygen Re-Evaluation):   Initial Exercise Prescription:     Initial Exercise Prescription - 03/19/17 1000      Date of Initial Exercise RX and Referring Provider   Date 03/19/17   Referring Provider Jenkins Rouge MD     Treadmill   MPH 2   Grade 0   Minutes 10   METs 2.53     Recumbant Bike   Level 2   Minutes 10   METs 2     NuStep   Level 2   SPM 70   Minutes 10   METs 2  Prescription Details   Frequency (times per week) 3   Duration Progress to 30 minutes of continuous aerobic without signs/symptoms of physical distress     Intensity   THRR 40-80% of Max Heartrate 53-106   Ratings of Perceived Exertion 11-13   Perceived Dyspnea 0-4     Progression   Progression Continue to progress workloads to maintain intensity without signs/symptoms of physical distress.     Resistance Training   Training Prescription Yes   Weight 2lbs   Reps 10-15      Perform Capillary Blood Glucose checks as needed.  Exercise Prescription Changes:   Exercise Comments:     Exercise Comments    Row Name 03/29/17 1445           Exercise Comments Pt is doing well and off to a great start with exercise!          Exercise Goals and Review:     Exercise Goals    Row Name 03/19/17 [id number]             Exercise Goals   Increase Physical Activity Yes       Intervention Provide advice, education, support and counseling about physical activity/exercise needs.;Develop an individualized exercise prescription for aerobic and resistive training based on initial evaluation findings, risk stratification, comorbidities and participant's personal goals.       Expected Outcomes Achievement of increased cardiorespiratory fitness and enhanced flexibility, muscular endurance and strength shown through measurements of functional capacity and personal statement of participant.  improve walking tolerance       Increase Strength and Stamina Yes       Intervention Provide advice, education, support and counseling about physical activity/exercise needs.;Develop an individualized exercise prescription for aerobic and resistive training based on initial evaluation findings, risk stratification,  comorbidities and participant's personal goals.       Expected Outcomes Achievement of increased cardiorespiratory fitness and enhanced flexibility, muscular endurance and strength shown through measurements of functional capacity and personal statement of participant.          Exercise Goals Re-Evaluation :    Discharge Exercise Prescription (Final Exercise Prescription Changes):   Nutrition:  Target Goals: Understanding of nutrition guidelines, daily intake of sodium 1500mg , cholesterol 200mg , calories 30% from fat and 7% or less from saturated fats, daily to have 5 or more servings of fruits and vegetables.  Biometrics:     Pre Biometrics - 03/19/17 1232      Pre Biometrics   Height 5' 7.5" (1.715 m)   Weight 174 lb 6.1 oz (79.1 kg)   Waist Circumference 41 inches   Hip Circumference 40 inches   Waist to Hip Ratio 1.02 %   BMI (Calculated) 27   Triceps Skinfold 18 mm   % Body Fat 29.2 %   Grip Strength 23 kg   Flexibility 9.75 in   Single Leg Stand 1.2 seconds       Nutrition Therapy Plan and Nutrition Goals:     Nutrition Therapy & Goals - 03/19/17 1222      Nutrition Therapy   Diet Therapeutic Lifestyle Changes     Personal Nutrition Goals   Nutrition Goal Pt to identify food quantities necessary to achieve weight loss of 6-10 lb at graduation from cardiac rehab. Goal wt of 160 lb desired.      Intervention Plan   Intervention Prescribe, educate and counsel regarding individualized specific dietary modifications aiming towards targeted core components such as weight, hypertension, lipid management, diabetes, heart  failure and other comorbidities.   Expected Outcomes Short Term Goal: Understand basic principles of dietary content, such as calories, fat, sodium, cholesterol and nutrients.;Long Term Goal: Adherence to prescribed nutrition plan.      Nutrition Discharge: Nutrition Scores:     Nutrition Assessments - 03/19/17 1123      MEDFICTS Scores    Pre Score 72      Nutrition Goals Re-Evaluation:   Nutrition Goals Re-Evaluation:   Nutrition Goals Discharge (Final Nutrition Goals Re-Evaluation):   Psychosocial: Target Goals: Acknowledge presence or absence of significant depression and/or stress, maximize coping skills, provide positive support system. Participant is able to verbalize types and ability to use techniques and skills needed for reducing stress and depression.  Initial Review & Psychosocial Screening:     Initial Psych Review & Screening - 03/19/17 1230      Initial Review   Current issues with None Identified     Family Dynamics   Good Support System? Yes     Barriers   Psychosocial barriers to participate in program There are no identifiable barriers or psychosocial needs.     Screening Interventions   Interventions Encouraged to exercise      Quality of Life Scores:     Quality of Life - 03/19/17 1035      Quality of Life Scores   Health/Function Pre 27.83 %   Socioeconomic Pre 28.21 %   Psych/Spiritual Pre 28.36 %   Family Pre 27.3 %   GLOBAL Pre 27.94 %      PHQ-9: Recent Review Flowsheet Data    Depression screen [facility] 2/9 03/29/2017 03/28/2017 02/21/2017 07/16/2015   Decreased Interest 0 0 0 0   Down, Depressed, Hopeless 0 0 0 0   PHQ - 2 Score 0 0 0 0     Interpretation of Total Score  Total Score Depression Severity:  1-4 = Minimal depression, 5-9 = Mild depression, 10-14 = Moderate depression, 15-19 = Moderately severe depression, 20-27 = Severe depression   Psychosocial Evaluation and Intervention:   Psychosocial Re-Evaluation:     Psychosocial Re-Evaluation    [place] Name 04/04/17 1215             Psychosocial Re-Evaluation   Current issues with None Identified       Interventions Encouraged to attend Cardiac Rehabilitation for the exercise       Continue Psychosocial Services  No Follow up required          Psychosocial Discharge (Final Psychosocial  Re-Evaluation):     Psychosocial Re-Evaluation - 04/04/17 1215      Psychosocial Re-Evaluation   Current issues with None Identified   Interventions Encouraged to attend Cardiac Rehabilitation for the exercise   Continue Psychosocial Services  No Follow up required      Vocational Rehabilitation: Provide vocational rehab assistance to qualifying candidates.   Vocational Rehab Evaluation & Intervention:     Vocational Rehab - 03/19/17 1231      Initial Vocational Rehab Evaluation & Intervention   Assessment shows need for Vocational Rehabilitation --  Mr Recker is retired      Education: Education Goals: Education classes will be provided on a weekly basis, covering required topics. Participant will state understanding/return demonstration of topics presented.  Learning Barriers/Preferences:     Learning Barriers/Preferences - 03/19/17 0818      Learning Barriers/Preferences   Learning Barriers Sight   Learning Preferences Written Material      Education Topics: Count Your Pulse:  -Group  instruction provided by verbal instruction, demonstration, patient participation and written materials to support subject.  Instructors address importance of being able to find your pulse and how to count your pulse when at home without a heart monitor.  Patients get hands on experience counting their pulse with staff help and individually.   Heart Attack, Angina, and Risk Factor Modification:  -Group instruction provided by verbal instruction, video, and written materials to support subject.  Instructors address signs and symptoms of angina and heart attacks.    Also discuss risk factors for heart disease and how to make changes to improve heart health risk factors.   Functional Fitness:  -Group instruction provided by verbal instruction, demonstration, patient participation, and written materials to support subject.  Instructors address safety measures for doing things around the  house.  Discuss how to get up and down off the floor, how to pick things up properly, how to safely get out of a chair without assistance, and balance training.   Meditation and Mindfulness:  -Group instruction provided by verbal instruction, patient participation, and written materials to support subject.  Instructor addresses importance of mindfulness and meditation practice to help reduce stress and improve awareness.  Instructor also leads participants through a meditation exercise.    CARDIAC REHAB PHASE II EXERCISE from 04/03/2017 in [place]  Date  04/03/17  Instruction Review Code  2- meets goals/outcomes      Stretching for Flexibility and Mobility:  -Group instruction provided by verbal instruction, patient participation, and written materials to support subject.  Instructors lead participants through series of stretches that are designed to increase flexibility thus improving mobility.  These stretches are additional exercise for major muscle groups that are typically performed during regular warm up and cool down.   Hands Only CPR:  -Group verbal, video, and participation provides a basic overview of AHA guidelines for community CPR. Role-play of emergencies allow participants the opportunity to practice calling for help and chest compression technique with discussion of AED use.   Hypertension: -Group verbal and written instruction that provides a basic overview of hypertension including the most recent diagnostic guidelines, risk factor reduction with self-care instructions and medication management.    Nutrition I class: Heart Healthy Eating:  -Group instruction provided by PowerPoint slides, verbal discussion, and written materials to support subject matter. The instructor gives an explanation and review of the Therapeutic Lifestyle Changes diet recommendations, which includes a discussion on lipid goals, dietary fat, sodium, fiber, plant  stanol/sterol esters, sugar, and the components of a well-balanced, healthy diet.   CARDIAC REHAB PHASE II EXERCISE from 04/03/2017 in [place]  Date  03/19/17  Educator  RD  Instruction Review Code  Not applicable      Nutrition II class: Lifestyle Skills:  -Group instruction provided by PowerPoint slides, verbal discussion, and written materials to support subject matter. The instructor gives an explanation and review of label reading, grocery shopping for heart health, heart healthy recipe modifications, and ways to make healthier choices when eating out.   CARDIAC REHAB PHASE II EXERCISE from 04/03/2017 in [place]  Date  03/19/17  Educator  RD  Instruction Review Code  Not applicable      Diabetes Question & Answer:  -Group instruction provided by PowerPoint slides, verbal discussion, and written materials to support subject matter. The instructor gives an explanation and review of diabetes co-morbidities, pre- and post-prandial blood glucose goals, pre-exercise blood  glucose goals, signs, symptoms, and treatment of hypoglycemia and hyperglycemia, and foot care basics.   Diabetes Blitz:  -Group instruction provided by PowerPoint slides, verbal discussion, and written materials to support subject matter. The instructor gives an explanation and review of the physiology behind type 1 and type 2 diabetes, diabetes medications and rational behind using different medications, pre- and post-prandial blood glucose recommendations and Hemoglobin A1c goals, diabetes diet, and exercise including blood glucose guidelines for exercising safely.    Portion Distortion:  -Group instruction provided by PowerPoint slides, verbal discussion, written materials, and food models to support subject matter. The instructor gives an explanation of serving size versus portion size, changes in portions sizes over the last 20 years, and what  consists of a serving from each food group.   Stress Management:  -Group instruction provided by verbal instruction, video, and written materials to support subject matter.  Instructors review role of stress in heart disease and how to cope with stress positively.     Exercising on Your Own:  -Group instruction provided by verbal instruction, power point, and written materials to support subject.  Instructors discuss benefits of exercise, components of exercise, frequency and intensity of exercise, and end points for exercise.  Also discuss use of nitroglycerin and activating EMS.  Review options of places to exercise outside of rehab.  Review guidelines for sex with heart disease.   Cardiac Drugs I:  -Group instruction provided by verbal instruction and written materials to support subject.  Instructor reviews cardiac drug classes: antiplatelets, anticoagulants, beta blockers, and statins.  Instructor discusses reasons, side effects, and lifestyle considerations for each drug class.   CARDIAC REHAB PHASE II EXERCISE from 04/03/2017 in [place]  Date  03/27/17  Instruction Review Code  2- meets goals/outcomes      Cardiac Drugs II:  -Group instruction provided by verbal instruction and written materials to support subject.  Instructor reviews cardiac drug classes: angiotensin converting enzyme inhibitors (ACE-I), angiotensin II receptor blockers (ARBs), nitrates, and calcium channel blockers.  Instructor discusses reasons, side effects, and lifestyle considerations for each drug class.   Anatomy and Physiology of the Circulatory System:  Group verbal and written instruction and models provide basic cardiac anatomy and physiology, with the coronary electrical and arterial systems. Review of: AMI, Angina, Valve disease, Heart Failure, Peripheral Artery Disease, Cardiac Arrhythmia, Pacemakers, and the ICD.   Other Education:  -Group or individual verbal,  written, or video instructions that support the educational goals of the cardiac rehab program.   Knowledge Questionnaire Score:     Knowledge Questionnaire Score - 03/19/17 1035      Knowledge Questionnaire Score   Pre Score 23/24      Core Components/Risk Factors/Patient Goals at Admission:     Personal Goals and Risk Factors at Admission - 03/19/17 1228      Core Components/Risk Factors/Patient Goals on Admission   Hypertension Yes   Intervention Provide education on lifestyle modifcations including regular physical activity/exercise, weight management, moderate sodium restriction and increased consumption of fresh fruit, vegetables, and low fat dairy, alcohol moderation, and smoking cessation.;Monitor prescription use compliance.   Lipids Yes   Intervention Provide education and support for participant on nutrition & aerobic/resistive exercise along with prescribed medications to achieve LDL 70mg , HDL >40mg .   Expected Outcomes Short Term: Participant states understanding of desired cholesterol values and is compliant with medications prescribed. Participant is following exercise prescription and nutrition guidelines.;Long Term: Cholesterol controlled with medications as  prescribed, with individualized exercise RX and with personalized nutrition plan. Value goals: LDL < 70mg , HDL > 40 mg.      Core Components/Risk Factors/Patient Goals Review:    Core Components/Risk Factors/Patient Goals at Discharge (Final Review):    ITP Comments:     ITP Comments    Row Name 03/19/17 0748           ITP Comments Medical Director- Dr. Fransico Him, MD          Comments: Jamael is making expected progress toward personal goals after completing 3sessions. Recommend continued exercise and life style modification education including  stress management and relaxation techniques to decrease cardiac risk profile. Kjuan returned to exercise yesterday per Tobie Lords FNP and exercise  without difficulty. Genaro is off to a good start to exercise.Barnet Pall, RN,BSN 04/04/2017 12:18 PM

## 2017-04-04 NOTE — Patient Instructions (Addendum)
Continue and finish Augmentin. Continue cough elixir as prescribed during last visit.    IF you received an x-ray today, you will receive an invoice from [facility]. Please contact [facility] at [phone] with questions or concerns regarding your invoice.   IF you received labwork today, you will receive an invoice from [facility]. Please contact [facility] at [phone] with questions or concerns regarding your invoice.   Our billing staff will not be able to assist you with questions regarding bills from these companies.  You will be contacted with the lab results as soon as they are available. The fastest way to get your results is to activate your My Chart account. Instructions are located on the last page of this paperwork. If you have not heard from [place] regarding the results in 2 weeks, please contact this office.     Cough, Adult A cough helps to clear your throat and lungs. A cough may last only 2-3 weeks (acute), or it may last longer than 8 weeks (chronic). Many different things can cause a cough. A cough may be a sign of an illness or another medical condition. Follow these instructions at home:  Pay attention to any changes in your cough.  Take medicines only as told by your doctor. ? If you were prescribed an antibiotic medicine, take it as told by your doctor. Do not stop taking it even if you start to feel better. ? Talk with your doctor before you try using a cough medicine.  Drink enough fluid to keep your pee (urine) clear or pale yellow.  If the air is dry, use a cold steam vaporizer or humidifier in your home.  Stay away from things that make you cough at work or at home.  If your cough is worse at night, try using extra pillows to raise your head up higher while you sleep.  Do not smoke, and try not to be around smoke. If you need help quitting, ask your doctor.  Do not have caffeine.  Do not drink alcohol.  Rest as needed. Contact a  doctor if:  You have new problems (symptoms).  You cough up yellow fluid (pus).  Your cough does not get better after 2-3 weeks, or your cough gets worse.  Medicine does not help your cough and you are not sleeping well.  You have pain that gets worse or pain that is not helped with medicine.  You have a fever.  You are losing weight and you do not know why.  You have night sweats. Get help right away if:  You cough up blood.  You have trouble breathing.  Your heartbeat is very fast. This information is not intended to replace advice given to you by your health care provider. Make sure you discuss any questions you have with your health care provider. Document Released: 04/19/2011 Document Revised: 01/12/2016 Document Reviewed: 10/13/2014 Elsevier Interactive Patient Education  [employer].

## 2017-04-05 ENCOUNTER — Encounter (HOSPITAL_COMMUNITY)
Admission: RE | Admit: 2017-04-05 | Discharge: 2017-04-05 | Disposition: A | Discharge status: Home / Self Care | Payer: Medicare Other | Source: Ambulatory Visit | Attending: Cardiovascular Disease | Admitting: Cardiovascular Disease

## 2017-04-05 DIAGNOSIS — Z48812 Encounter for surgical aftercare following surgery on the circulatory system: Secondary | ICD-10-CM | POA: Diagnosis not present

## 2017-04-05 DIAGNOSIS — Z9889 Other specified postprocedural states: Secondary | ICD-10-CM

## 2017-04-05 DIAGNOSIS — Z955 Presence of coronary angioplasty implant and graft: Secondary | ICD-10-CM

## 2017-04-08 ENCOUNTER — Encounter (HOSPITAL_COMMUNITY)
Admission: RE | Admit: 2017-04-08 | Discharge: 2017-04-08 | Disposition: A | Discharge status: Home / Self Care | Payer: Medicare Other | Source: Ambulatory Visit | Attending: Cardiovascular Disease | Admitting: Cardiovascular Disease

## 2017-04-08 DIAGNOSIS — Z955 Presence of coronary angioplasty implant and graft: Secondary | ICD-10-CM

## 2017-04-08 DIAGNOSIS — Z9889 Other specified postprocedural states: Secondary | ICD-10-CM

## 2017-04-08 DIAGNOSIS — Z48812 Encounter for surgical aftercare following surgery on the circulatory system: Secondary | ICD-10-CM | POA: Diagnosis not present

## 2017-04-10 ENCOUNTER — Telehealth (HOSPITAL_COMMUNITY): Payer: Self-pay | Admitting: Family Medicine

## 2017-04-10 ENCOUNTER — Encounter (HOSPITAL_COMMUNITY): Payer: Medicare Other

## 2017-04-12 ENCOUNTER — Encounter (HOSPITAL_COMMUNITY)
Admission: RE | Admit: 2017-04-12 | Discharge: 2017-04-12 | Disposition: A | Discharge status: Home / Self Care | Payer: Medicare Other | Source: Ambulatory Visit | Attending: Cardiovascular Disease | Admitting: Cardiovascular Disease

## 2017-04-12 DIAGNOSIS — Z955 Presence of coronary angioplasty implant and graft: Secondary | ICD-10-CM

## 2017-04-12 DIAGNOSIS — Z9889 Other specified postprocedural states: Secondary | ICD-10-CM

## 2017-04-12 DIAGNOSIS — Z48812 Encounter for surgical aftercare following surgery on the circulatory system: Secondary | ICD-10-CM | POA: Diagnosis not present

## 2017-04-15 ENCOUNTER — Encounter (HOSPITAL_COMMUNITY)
Admission: RE | Admit: 2017-04-15 | Discharge: 2017-04-15 | Disposition: A | Discharge status: Home / Self Care | Payer: Medicare Other | Source: Ambulatory Visit | Attending: Cardiovascular Disease | Admitting: Cardiovascular Disease

## 2017-04-15 DIAGNOSIS — Z9889 Other specified postprocedural states: Secondary | ICD-10-CM

## 2017-04-15 DIAGNOSIS — Z955 Presence of coronary angioplasty implant and graft: Secondary | ICD-10-CM

## 2017-04-15 DIAGNOSIS — Z48812 Encounter for surgical aftercare following surgery on the circulatory system: Secondary | ICD-10-CM | POA: Diagnosis not present

## 2017-04-17 ENCOUNTER — Encounter (HOSPITAL_COMMUNITY)
Admission: RE | Admit: 2017-04-17 | Discharge: 2017-04-17 | Disposition: A | Discharge status: Home / Self Care | Payer: Medicare Other | Source: Ambulatory Visit | Attending: Cardiovascular Disease | Admitting: Cardiovascular Disease

## 2017-04-17 ENCOUNTER — Ambulatory Visit (INDEPENDENT_AMBULATORY_CARE_PROVIDER_SITE_OTHER): Payer: Medicare Other | Admitting: Emergency Medicine

## 2017-04-17 ENCOUNTER — Encounter: Payer: Self-pay | Admitting: Emergency Medicine

## 2017-04-17 VITALS — BP 105/46 | HR 61 | Temp 97.8°F | Resp 16 | Ht 67.0 in | Wt 169.0 lb

## 2017-04-17 DIAGNOSIS — J040 Acute laryngitis: Secondary | ICD-10-CM

## 2017-04-17 DIAGNOSIS — I6523 Occlusion and stenosis of bilateral carotid arteries: Secondary | ICD-10-CM | POA: Diagnosis not present

## 2017-04-17 DIAGNOSIS — Z955 Presence of coronary angioplasty implant and graft: Secondary | ICD-10-CM

## 2017-04-17 DIAGNOSIS — J069 Acute upper respiratory infection, unspecified: Secondary | ICD-10-CM | POA: Diagnosis not present

## 2017-04-17 DIAGNOSIS — Z48812 Encounter for surgical aftercare following surgery on the circulatory system: Secondary | ICD-10-CM | POA: Diagnosis not present

## 2017-04-17 DIAGNOSIS — Z9889 Other specified postprocedural states: Secondary | ICD-10-CM

## 2017-04-17 MED ORDER — PREDNISONE 20 MG PO TABS: 20.0000 mg | ORAL_TABLET | Freq: Every day | ORAL | 0 refills | Status: AC

## 2017-04-17 NOTE — Progress Notes (Signed)
[name] 83 y.o.   Chief Complaint  Patient presents with  . Cough    nonproductive x 3 weeks  . Sore Throat    HISTORY OF PRESENT ILLNESS: This is a 83 y.o. male seen by me 8/10 & 8/16 with URI and cough; here for follow up; coughing better but now has been losing voice; no new symptomatology.  HPI   Prior to Admission medications   Medication Sig Start Date End Date Taking? Authorizing Provider  acetaminophen (TYLENOL) 500 MG tablet Take 1 tablet (500 mg total) by mouth every 6 (six) hours as needed for moderate pain (wrist). 02/28/17  Yes Reino Bellis B, NP  amLODipine (NORVASC) 10 MG tablet Take 1 tablet (10 mg total) by mouth daily. 10/26/16  Yes Josue Hector, MD  aspirin EC 81 MG EC tablet Take 1 tablet (81 mg total) by mouth daily. 01/12/16  Yes Reino Bellis B, NP  b complex vitamins tablet Take 1 tablet by mouth every other day.    Yes [provider]  Calcium Carbonate (CALCIUM 500 PO) Take 1 tablet by mouth daily.   Yes [provider]  Cholecalciferol (VITAMIN D-3) 1000 units CAPS Take 1 capsule by mouth daily.   Yes [provider]  clopidogrel (PLAVIX) 75 MG tablet Take 1 tablet (75 mg total) by mouth daily with breakfast. 02/21/17  Yes Eileen Stanford, PA-C  Glucosamine 500 MG CAPS Take 1 capsule by mouth daily.    Yes [provider]  isosorbide mononitrate (IMDUR) 60 MG 24 hr tablet Take 1.5 tablets (90 mg total) by mouth daily. 02/28/17  Yes Reino Bellis B, NP  lisinopril (PRINIVIL,ZESTRIL) 40 MG tablet Take 40 mg by mouth daily. 01/24/17  Yes [provider]  Multiple Vitamin (MULTIVITAMIN) capsule Take 1 capsule by mouth daily.   Yes [provider]  nitroGLYCERIN (NITROSTAT) 0.4 MG SL tablet place 1 tablet under the tongue if needed every 5 minutes fo 02/28/17  Yes Skains, Thana Farr, MD  Omega-3 Fatty Acids (FISH OIL) 1000 MG CAPS Take 1 capsule by mouth daily.   Yes [provider]  ranolazine  (RANEXA) 1000 MG SR tablet Take 1 tablet (1,000 mg total) by mouth 2 (two) times daily. 02/28/17  Yes Reino Bellis B, NP  simvastatin (ZOCOR) 20 MG tablet Take 1 tablet (20 mg total) by mouth daily. 10/26/16  Yes Josue Hector, MD  vitamin C (ASCORBIC ACID) 500 MG tablet Take 500 mg by mouth daily.     Yes [provider]  colchicine 0.6 MG tablet Take 0.6 mg by mouth daily.    [provider]  OVER THE COUNTER MEDICATION 600 mg as needed.    [provider]  promethazine-dextromethorphan (PROMETHAZINE-DM) 6.25-15 MG/5ML syrup Take 5 mLs by mouth 4 (four) times daily as needed for cough. Patient not taking: Reported on 04/17/2017 04/01/17   Bradley Pollen, MD    No Known Allergies  Patient Active Problem List   Diagnosis Date Noted  . Cough 03/29/2017  . Acute bronchitis 03/29/2017  . Acute upper respiratory infection 03/29/2017  . Presence of drug coated stent in RCA & Ramus Intermedius. 02/28/2017  . Gout flare 02/22/2017  . Coronary artery disease involving native coronary artery of native heart with angina pectoris ([place])   . Carotid artery disease ([place])   . HLD (hyperlipidemia)   . Unstable angina ([place]) 02/20/2017  . Atherosclerosis of native coronary artery of native heart   . Essential hypertension  01/06/2009  . Second degree atrioventricular block, Mobitz type I 01/06/2009    Past Medical History:  Diagnosis Date  . AV BLOCK, 1ST DEGREE   . Carotid artery disease ([place])    a. s/p Left ECA followed by Dr. Donnetta Hutching  . Coronary artery disease   . Coronary artery disease involving coronary bypass graft of native heart with angina pectoris ([place])    a. LHC 12/2015  3vd Left Cx 100% with colaterals, and distally LAD occluded, DES to prox-mid RCA  b. 02/2017: repeat cath with patent RCA stent and stable CAD; 02/27/2017: Rota-PCI to Ost RI  . GERD (gastroesophageal reflux disease)   . HLD (hyperlipidemia)   . HTN (hypertension)   . NSVT (nonsustained  ventricular tachycardia) ([place])    during stress test 12/2015  . Presence of drug coated stent in RCA & Ramus Intermedius. 02/28/2017   12/2015: PCI p-mRCA - Stent Resolute Integ 3.5x34 02/2017: Rota-PCI o-mRamus - overlapping DES STENT SYNERGY DES 3X32  & 3x24 (tapered post-dilation)  . Stroke [facility])     Past Surgical History:  Procedure Laterality Date  . CARDIAC CATHETERIZATION     x2  . CARDIAC CATHETERIZATION N/A 01/11/2016   Procedure: Left Heart Cath and Coronary Angiography;  Surgeon: Wellington Hampshire, MD;  Location: [place] [facility];  Service: Cardiovascular;  Laterality: N/A;  . CAROTID ENDARTERECTOMY  11/28/2006   left  . CORONARY ATHERECTOMY N/A 02/27/2017   Procedure: Coronary Atherectomy;  Surgeon: Troy Sine, MD;  Location: [place] [facility];  Service: Cardiovascular;  Laterality: N/A;  . HERNIA REPAIR    . LEFT HEART CATH AND CORONARY ANGIOGRAPHY N/A 02/21/2017   Procedure: Left Heart Cath and Coronary Angiography;  Surgeon: Lorretta Harp, MD;  Location: [place] [facility];  Service: Cardiovascular;  Laterality: N/A;  . TONSILLECTOMY      Social History   Social History  . Marital status: Married    Spouse name: N/A  . Number of children: N/A  . Years of education: N/A   Occupational History  . Not on file.   Social History Main Topics  . Smoking status: Never Smoker  . Smokeless tobacco: Never Used  . Alcohol use 9.0 oz/week    15 Glasses of wine per week  . Drug use: No  . Sexual activity: Not on file   Other Topics Concern  . Not on file   Social History Narrative  . No narrative on file    Family History  Problem Relation Age of Onset  . Diabetes Mother   . Diabetes Sister   . Cancer Sister 28       colon  . Heart disease Sister      Review of Systems  Constitutional: Negative.  Negative for chills and fever.  HENT: Positive for sore throat.   Eyes: Negative for discharge and redness.  Respiratory: Positive for cough.     Cardiovascular: Negative for chest pain and palpitations.  Gastrointestinal: Negative for abdominal pain, diarrhea, nausea and vomiting.  Genitourinary: Negative.   Skin: Negative for rash.  Neurological: Negative for dizziness and headaches.  Endo/Heme/Allergies: Negative.   All other systems reviewed and are negative.   Vitals:   04/17/17 1133  BP: (!) 105/46  Pulse: 61  Resp: 16  Temp: 97.8 F (36.6 C)  SpO2: 98%    Physical Exam  Constitutional: He is oriented to person, place, and time. He appears well-developed and well-nourished.  HENT:  Head: Normocephalic and atraumatic.  Nose: Nose normal.  Mouth/Throat: Oropharynx is clear and moist.  Eyes: Pupils are equal, round, and reactive to light. Conjunctivae and EOM are normal.  Neck: Normal range of motion. Neck supple. No JVD present. No thyromegaly present.  Cardiovascular: Normal rate, regular rhythm and normal heart sounds.   Pulmonary/Chest: Effort normal and breath sounds normal.  Abdominal: Soft. There is no tenderness.  Musculoskeletal: Normal range of motion.  Lymphadenopathy:    He has no cervical adenopathy.  Neurological: He is alert and oriented to person, place, and time. No sensory deficit. He exhibits normal muscle tone.  Skin: Skin is warm and dry. Capillary refill takes less than 2 seconds. No rash noted.  Psychiatric: He has a normal mood and affect. His behavior is normal.  Vitals reviewed.    ASSESSMENT & PLAN: [name] was seen today for cough and sore throat.  Diagnoses and all orders for this visit:  Acute upper respiratory infection  Laryngitis  Other orders -     predniSONE (DELTASONE) 20 MG tablet; Take 1 tablet (20 mg total) by mouth daily with breakfast.    Patient Instructions       IF you received an x-ray today, you will receive an invoice from [facility]. Please contact [facility] at [phone] with questions or concerns regarding your invoice.   IF  you received labwork today, you will receive an invoice from [facility]. Please contact [facility] at [phone] with questions or concerns regarding your invoice.   Our billing staff will not be able to assist you with questions regarding bills from these companies.  You will be contacted with the lab results as soon as they are available. The fastest way to get your results is to activate your My Chart account. Instructions are located on the last page of this paperwork. If you have not heard from [place] regarding the results in 2 weeks, please contact this office.     Upper Respiratory Infection, Adult Most upper respiratory infections (URIs) are caused by a virus. A URI affects the nose, throat, and upper air passages. The most common type of URI is often called "the common cold." Follow these instructions at home:  Take medicines only as told by your doctor.  Gargle warm saltwater or take cough drops to comfort your throat as told by your doctor.  Use a warm mist humidifier or inhale steam from a shower to increase air moisture. This may make it easier to breathe.  Drink enough fluid to keep your pee (urine) clear or pale yellow.  Eat soups and other clear broths.  Have a healthy diet.  Rest as needed.  Go back to work when your fever is gone or your doctor says it is okay. ? You may need to stay home longer to avoid giving your URI to others. ? You can also wear a face mask and wash your hands often to prevent spread of the virus.  Use your inhaler more if you have asthma.  Do not use any tobacco products, including cigarettes, chewing tobacco, or electronic cigarettes. If you need help quitting, ask your doctor. Contact a doctor if:  You are getting worse, not better.  Your symptoms are not helped by medicine.  You have chills.  You are getting more short of breath.  You have brown or red mucus.  You have yellow or brown discharge from your nose.  You have pain in your  face, especially when you bend forward.  You have a fever.  You have puffy (swollen) neck glands.  You have pain while swallowing.  You have white areas in the back of your throat. Get help right away if:  You have very bad or constant: ? Headache. ? Ear pain. ? Pain in your forehead, behind your eyes, and over your cheekbones (sinus pain). ? Chest pain.  You have long-lasting (chronic) lung disease and any of the following: ? Wheezing. ? Long-lasting cough. ? Coughing up blood. ? A change in your usual mucus.  You have a stiff neck.  You have changes in your: ? Vision. ? Hearing. ? Thinking. ? Mood. This information is not intended to replace advice given to you by your health care provider. Make sure you discuss any questions you have with your health care provider. Document Released: 01/23/2008 Document Revised: 04/08/2016 Document Reviewed: 11/11/2013 Elsevier Interactive Patient Education  2018 Elsevier Inc.      Agustina Caroli, MD [facility]

## 2017-04-17 NOTE — Patient Instructions (Addendum)
     IF you received an x-ray today, you will receive an invoice from [facility]. Please contact [facility] at [phone] with questions or concerns regarding your invoice.   IF you received labwork today, you will receive an invoice from [facility]. Please contact [facility] at [phone] with questions or concerns regarding your invoice.   Our billing staff will not be able to assist you with questions regarding bills from these companies.  You will be contacted with the lab results as soon as they are available. The fastest way to get your results is to activate your My Chart account. Instructions are located on the last page of this paperwork. If you have not heard from us regarding the results in 2 weeks, please contact this office.     Upper Respiratory Infection, Adult Most upper respiratory infections (URIs) are caused by a virus. A URI affects the nose, throat, and upper air passages. The most common type of URI is often called "the common cold." Follow these instructions at home:  Take medicines only as told by your doctor.  Gargle warm saltwater or take cough drops to comfort your throat as told by your doctor.  Use a warm mist humidifier or inhale steam from a shower to increase air moisture. This may make it easier to breathe.  Drink enough fluid to keep your pee (urine) clear or pale yellow.  Eat soups and other clear broths.  Have a healthy diet.  Rest as needed.  Go back to work when your fever is gone or your doctor says it is okay. ? You may need to stay home longer to avoid giving your URI to others. ? You can also wear a face mask and wash your hands often to prevent spread of the virus.  Use your inhaler more if you have asthma.  Do not use any tobacco products, including cigarettes, chewing tobacco, or electronic cigarettes. If you need help quitting, ask your doctor. Contact a doctor if:  You are getting worse, not better.  Your  symptoms are not helped by medicine.  You have chills.  You are getting more short of breath.  You have brown or red mucus.  You have yellow or brown discharge from your nose.  You have pain in your face, especially when you bend forward.  You have a fever.  You have puffy (swollen) neck glands.  You have pain while swallowing.  You have white areas in the back of your throat. Get help right away if:  You have very bad or constant: ? Headache. ? Ear pain. ? Pain in your forehead, behind your eyes, and over your cheekbones (sinus pain). ? Chest pain.  You have long-lasting (chronic) lung disease and any of the following: ? Wheezing. ? Long-lasting cough. ? Coughing up blood. ? A change in your usual mucus.  You have a stiff neck.  You have changes in your: ? Vision. ? Hearing. ? Thinking. ? Mood. This information is not intended to replace advice given to you by your health care provider. Make sure you discuss any questions you have with your health care provider. Document Released: 01/23/2008 Document Revised: 04/08/2016 Document Reviewed: 11/11/2013 Elsevier Interactive Patient Education  2018 Elsevier Inc.  

## 2017-04-19 ENCOUNTER — Encounter (HOSPITAL_COMMUNITY)
Admission: RE | Admit: 2017-04-19 | Discharge: 2017-04-19 | Disposition: A | Discharge status: Home / Self Care | Payer: Medicare Other | Source: Ambulatory Visit | Attending: Cardiovascular Disease | Admitting: Cardiovascular Disease

## 2017-04-19 DIAGNOSIS — Z9889 Other specified postprocedural states: Secondary | ICD-10-CM

## 2017-04-19 DIAGNOSIS — Z955 Presence of coronary angioplasty implant and graft: Secondary | ICD-10-CM

## 2017-04-19 DIAGNOSIS — Z48812 Encounter for surgical aftercare following surgery on the circulatory system: Secondary | ICD-10-CM | POA: Diagnosis not present

## 2017-04-24 ENCOUNTER — Encounter (HOSPITAL_COMMUNITY)
Admission: RE | Admit: 2017-04-24 | Discharge: 2017-04-24 | Disposition: A | Discharge status: Home / Self Care | Payer: Medicare Other | Source: Ambulatory Visit | Attending: Cardiovascular Disease | Admitting: Cardiovascular Disease

## 2017-04-24 DIAGNOSIS — Z48812 Encounter for surgical aftercare following surgery on the circulatory system: Secondary | ICD-10-CM | POA: Diagnosis present

## 2017-04-24 DIAGNOSIS — Z9889 Other specified postprocedural states: Secondary | ICD-10-CM

## 2017-04-24 DIAGNOSIS — Z955 Presence of coronary angioplasty implant and graft: Secondary | ICD-10-CM | POA: Diagnosis not present

## 2017-04-24 NOTE — Progress Notes (Signed)
[name] 83 y.o. male DOB: 1928/01/29 MRN: [id number]      Nutrition Note  1. 02/27/17 atherectomy   2. 02/27/17 DES x2    Lab Results  Component Value Date   HGBA1C 6.0 (H) 01/11/2016   Nutrition Note Spoke with pt. Nutrition plan and goals reviewed with pt. Pt is continuing to work toward following Step 1 of the Therapeutic Lifestyle Changes diet. Age-appropriate nutrition recommendations discussed.  Pt is pre-diabetic according to his last A1c. Per discussion, pt was unaware of pre-diabetes. Pre-diabetes discussed. Pt encouraged to discuss pre-diabetes with his PCP further. Pt expressed understanding of the information reviewed.   Nutrition Diagnosis ? Food-and nutrition-related knowledge deficit related to lack of exposure to information as related to diagnosis of: ? CVD ? Pre-DM ? Overweight related to excessive energy intake as evidenced by a BMI of 27.0  Nutrition Intervention ? Pt's individual nutrition plan and goals reviewed with pt. ? Benefits of adopting Therapeutic Lifestyle Changes discussed when Medficts reviewed.    Nutrition Goal(s):  ? Pt to identify food quantities necessary to achieve weight loss of 6-10 lb at graduation from cardiac rehab. Goal wt of 160 lb desired.  Plan:  Pt to attend nutrition classes ? Portion Distortion  Will provide client-centered nutrition education as part of interdisciplinary care.   Monitor and evaluate progress toward nutrition goal with team.  Derek Mound, M.Ed, RD, LDN, CDE 04/24/2017 3:36 PM

## 2017-04-24 NOTE — Progress Notes (Signed)
Reviewed home exercise guidelines with patient including endpoints, temperature precautions, target heart rate and rate of perceived exertion. Pt plans to walk as his mode of home exercise. Pt voices understanding of instructions given.   [name] M Roseanna Koplin, MS, ACSM CEP  

## 2017-04-25 ENCOUNTER — Other Ambulatory Visit: Payer: Self-pay

## 2017-04-25 MED ORDER — ISOSORBIDE MONONITRATE ER 60 MG PO TB24: 90.0000 mg | ORAL_TABLET | Freq: Every day | ORAL | 2 refills | Status: DC

## 2017-04-25 MED ORDER — RANOLAZINE ER 1000 MG PO TB12: 1000.0000 mg | ORAL_TABLET | Freq: Two times a day (BID) | ORAL | 2 refills | Status: DC

## 2017-04-26 ENCOUNTER — Encounter (HOSPITAL_COMMUNITY)
Admission: RE | Admit: 2017-04-26 | Discharge: 2017-04-26 | Disposition: A | Discharge status: Home / Self Care | Payer: Medicare Other | Source: Ambulatory Visit | Attending: Cardiovascular Disease | Admitting: Cardiovascular Disease

## 2017-04-26 DIAGNOSIS — Z955 Presence of coronary angioplasty implant and graft: Secondary | ICD-10-CM

## 2017-04-26 DIAGNOSIS — Z48812 Encounter for surgical aftercare following surgery on the circulatory system: Secondary | ICD-10-CM | POA: Diagnosis not present

## 2017-04-26 DIAGNOSIS — Z9889 Other specified postprocedural states: Secondary | ICD-10-CM

## 2017-04-29 ENCOUNTER — Encounter (HOSPITAL_COMMUNITY)
Admission: RE | Admit: 2017-04-29 | Discharge: 2017-04-29 | Disposition: A | Discharge status: Home / Self Care | Payer: Medicare Other | Source: Ambulatory Visit | Attending: Cardiovascular Disease | Admitting: Cardiovascular Disease

## 2017-04-29 DIAGNOSIS — Z955 Presence of coronary angioplasty implant and graft: Secondary | ICD-10-CM

## 2017-04-29 DIAGNOSIS — Z9889 Other specified postprocedural states: Secondary | ICD-10-CM

## 2017-04-29 DIAGNOSIS — Z48812 Encounter for surgical aftercare following surgery on the circulatory system: Secondary | ICD-10-CM | POA: Diagnosis not present

## 2017-04-30 NOTE — Progress Notes (Signed)
Cardiac Individual Treatment Plan  Patient Details  Name: [name] MRN: [id number] Date of Birth: Sep 25, 1927 Referring Provider:     CARDIAC REHAB PHASE II ORIENTATION from 03/19/2017 in [place]  Referring Provider  Jenkins Rouge MD      Initial Encounter Date:    CARDIAC REHAB PHASE II ORIENTATION from 03/19/2017 in [place]  Date  03/19/17  Referring Provider  Jenkins Rouge MD      Visit Diagnosis: 02/27/17 DES x2  02/27/17 atherectomy  Patient's Home Medications on Admission:  Current Outpatient Prescriptions:  .  acetaminophen (TYLENOL) 500 MG tablet, Take 1 tablet (500 mg total) by mouth every 6 (six) hours as needed for moderate pain (wrist)., Disp: 30 tablet, Rfl: 0 .  amLODipine (NORVASC) 10 MG tablet, Take 1 tablet (10 mg total) by mouth daily., Disp: 90 tablet, Rfl: 3 .  aspirin EC 81 MG EC tablet, Take 1 tablet (81 mg total) by mouth daily., Disp: , Rfl:  .  b complex vitamins tablet, Take 1 tablet by mouth every other day. , Disp: , Rfl:  .  Calcium Carbonate (CALCIUM 500 PO), Take 1 tablet by mouth daily., Disp: , Rfl:  .  Cholecalciferol (VITAMIN D-3) 1000 units CAPS, Take 1 capsule by mouth daily., Disp: , Rfl:  .  clopidogrel (PLAVIX) 75 MG tablet, Take 1 tablet (75 mg total) by mouth daily with breakfast., Disp: 90 tablet, Rfl: 2 .  colchicine 0.6 MG tablet, Take 0.6 mg by mouth daily., Disp: , Rfl:  .  Glucosamine 500 MG CAPS, Take 1 capsule by mouth daily. , Disp: , Rfl:  .  isosorbide mononitrate (IMDUR) 60 MG 24 hr tablet, Take 1.5 tablets (90 mg total) by mouth daily., Disp: 135 tablet, Rfl: 2 .  lisinopril (PRINIVIL,ZESTRIL) 40 MG tablet, Take 40 mg by mouth daily., Disp: , Rfl:  .  Multiple Vitamin (MULTIVITAMIN) capsule, Take 1 capsule by mouth daily., Disp: , Rfl:  .  nitroGLYCERIN (NITROSTAT) 0.4 MG SL tablet, place 1 tablet under the tongue if needed every 5 minutes fo, Disp: 25 tablet,  Rfl: 3 .  Omega-3 Fatty Acids (FISH OIL) 1000 MG CAPS, Take 1 capsule by mouth daily., Disp: , Rfl:  .  OVER THE COUNTER MEDICATION, 600 mg as needed., Disp: , Rfl:  .  promethazine-dextromethorphan (PROMETHAZINE-DM) 6.25-15 MG/5ML syrup, Take 5 mLs by mouth 4 (four) times daily as needed for cough. (Patient not taking: Reported on 04/17/2017), Disp: 118 mL, Rfl: 0 .  ranolazine (RANEXA) 1000 MG SR tablet, Take 1 tablet (1,000 mg total) by mouth 2 (two) times daily., Disp: 180 tablet, Rfl: 2 .  simvastatin (ZOCOR) 20 MG tablet, Take 1 tablet (20 mg total) by mouth daily., Disp: 90 tablet, Rfl: 3 .  vitamin C (ASCORBIC ACID) 500 MG tablet, Take 500 mg by mouth daily.  , Disp: , Rfl:   Past Medical History: Past Medical History:  Diagnosis Date  . AV BLOCK, 1ST DEGREE   . Carotid artery disease ([place])    a. s/p Left ECA followed by Dr. Donnetta Hutching  . Coronary artery disease   . Coronary artery disease involving coronary bypass graft of native heart with angina pectoris ([place])    a. LHC 12/2015  3vd Left Cx 100% with colaterals, and distally LAD occluded, DES to prox-mid RCA  b. 02/2017: repeat cath with patent RCA stent and stable CAD; 02/27/2017: Rota-PCI to [place]  . GERD (gastroesophageal reflux  disease)   . HLD (hyperlipidemia)   . HTN (hypertension)   . NSVT (nonsustained ventricular tachycardia) ([place])    during stress test 12/2015  . Presence of drug coated stent in RCA & Ramus Intermedius. 02/28/2017   12/2015: PCI p-mRCA - Stent Resolute Integ 3.5x34 02/2017: Rota-PCI o-mRamus - overlapping DES STENT SYNERGY DES 3X32  & 3x24 (tapered post-dilation)  . Stroke [facility])     Tobacco Use: History  Smoking Status  . Never Smoker  Smokeless Tobacco  . Never Used    Labs: Recent Review Flowsheet Data    Labs for ITP Cardiac and Pulmonary Rehab Latest Ref Rng & Units 05/24/2010 01/11/2016 01/12/2016 02/21/2017   Cholestrol 0 - 200 mg/dL 139 - 120 109   LDLCALC 0 - 99 mg/dL 70 - 48 53   HDL >40 mg/dL  51.30 - 52 46   Trlycerides <150 mg/dL 87.0 - 98 52   Hemoglobin A1c 4.8 - 5.6 % - 6.0(H) - -      Capillary Blood Glucose: No results found for: GLUCAP   Exercise Target Goals:    Exercise Program Goal: Individual exercise prescription set with THRR, safety & activity barriers. Participant demonstrates ability to understand and report RPE using BORG scale, to self-measure pulse accurately, and to acknowledge the importance of the exercise prescription.  Exercise Prescription Goal: Starting with aerobic activity 30 plus minutes a day, 3 days per week for initial exercise prescription. Provide home exercise prescription and guidelines that participant acknowledges understanding prior to discharge.  Activity Barriers & Risk Stratification:     Activity Barriers & Cardiac Risk Stratification - 03/19/17 0819      Activity Barriers & Cardiac Risk Stratification   Activity Barriers Arthritis;Deconditioning;Muscular Weakness;Balance Concerns   Cardiac Risk Stratification High      6 Minute Walk:     6 Minute Walk    Row Name 03/19/17 1038         6 Minute Walk   Phase Initial     Distance 12173 feet     Walk Time 6 minutes     # of Rest Breaks 0     MPH 2.42     METS 1.66     RPE 13     VO2 Peak 5.8     Symptoms Yes (comment)     Comments pt c/o "woozy/dizzy" with walk test. Pt did not eat breakfast, given light snack (banana/PB crackers, ginger ale) symptoms resolved.     Resting HR 59 bpm     Resting BP 118/60     Max Ex. HR 72 bpm     Max Ex. BP 124/72     2 Minute Post BP 112/52        Oxygen Initial Assessment:   Oxygen Re-Evaluation:   Oxygen Discharge (Final Oxygen Re-Evaluation):   Initial Exercise Prescription:     Initial Exercise Prescription - 03/19/17 1000      Date of Initial Exercise RX and Referring Provider   Date 03/19/17   Referring Provider Jenkins Rouge MD     Treadmill   MPH 2   Grade 0   Minutes 10   METs 2.53      Recumbant Bike   Level 2   Minutes 10   METs 2     NuStep   Level 2   SPM 70   Minutes 10   METs 2     Prescription Details   Frequency (times per week) 3   Duration  Progress to 30 minutes of continuous aerobic without signs/symptoms of physical distress     Intensity   THRR 40-80% of Max Heartrate 53-106   Ratings of Perceived Exertion 11-13   Perceived Dyspnea 0-4     Progression   Progression Continue to progress workloads to maintain intensity without signs/symptoms of physical distress.     Resistance Training   Training Prescription Yes   Weight 2lbs   Reps 10-15      Perform Capillary Blood Glucose checks as needed.  Exercise Prescription Changes:     Exercise Prescription Changes    Row Name 04/12/17 1700 04/26/17 1700           Response to Exercise   Blood Pressure (Admit) 120/60 122/68      Blood Pressure (Exercise) 110/58 128/60      Blood Pressure (Exit) 122/60 118/62      Heart Rate (Admit) 77 bpm 63 bpm      Heart Rate (Exercise) 106 bpm 77 bpm      Heart Rate (Exit) 76 bpm 62 bpm      Rating of Perceived Exertion (Exercise) 13 14      Symptoms none none      Duration Progress to 30 minutes of  aerobic without signs/symptoms of physical distress Progress to 30 minutes of  aerobic without signs/symptoms of physical distress      Intensity THRR unchanged THRR unchanged        Progression   Progression Continue to progress workloads to maintain intensity without signs/symptoms of physical distress. Continue to progress workloads to maintain intensity without signs/symptoms of physical distress.      Average METs 2.2 2.2        Resistance Training   Training Prescription Yes Yes      Weight 2lbs 2lbs      Reps 10-15 10-15        Interval Training   Interval Training No No        Treadmill   MPH -  -      Grade -  -      Minutes -  -      METs -  -        Recumbant Bike   Level 2 2      Minutes 10 10      METs 2 2        NuStep    Level 2 3      SPM 70 70      Minutes 10 10      METs 1.9 2        Track   Laps 9 11      Minutes 10 10      METs 2.57 2.74        Home Exercise Plan   Plans to continue exercise at  - Home (comment)      Frequency  - Add 1 additional day to program exercise sessions.      Initial Home Exercises Provided  - 04/24/17         Exercise Comments:     Exercise Comments    Row Name 03/29/17 1445 04/24/17 1821         Exercise Comments Pt is doing well and off to a great start with exercise! Reviewed home exercise prescription and METs. Encouraged pt to walk 30 minutes, at least 1 other day in addition to CR, and pt is agreeable to this.  Exercise Goals and Review:     Exercise Goals    Row Name 03/19/17 [phone]             Exercise Goals   Increase Physical Activity Yes       Intervention Provide advice, education, support and counseling about physical activity/exercise needs.;Develop an individualized exercise prescription for aerobic and resistive training based on initial evaluation findings, risk stratification, comorbidities and participant's personal goals.       Expected Outcomes Achievement of increased cardiorespiratory fitness and enhanced flexibility, muscular endurance and strength shown through measurements of functional capacity and personal statement of participant.  improve walking tolerance       Increase Strength and Stamina Yes       Intervention Provide advice, education, support and counseling about physical activity/exercise needs.;Develop an individualized exercise prescription for aerobic and resistive training based on initial evaluation findings, risk stratification, comorbidities and participant's personal goals.       Expected Outcomes Achievement of increased cardiorespiratory fitness and enhanced flexibility, muscular endurance and strength shown through measurements of functional capacity and personal statement of participant.           Exercise Goals Re-Evaluation :     Exercise Goals Re-Evaluation    Row Name 04/26/17 1744             Exercise Goal Re-Evaluation   Exercise Goals Review Increase Physical Activity;Increase Strength and Stamina       Comments Pt feels that his walking tolerance is better but his stength and stamina is about the same. Encouraged to walk at least 1 day in addition to CR.       Expected Outcomes Begin walking at least 1 day at home. Increase workloads at CR as tolerated.           Discharge Exercise Prescription (Final Exercise Prescription Changes):     Exercise Prescription Changes - 04/26/17 1700      Response to Exercise   Blood Pressure (Admit) 122/68   Blood Pressure (Exercise) 128/60   Blood Pressure (Exit) 118/62   Heart Rate (Admit) 63 bpm   Heart Rate (Exercise) 77 bpm   Heart Rate (Exit) 62 bpm   Rating of Perceived Exertion (Exercise) 14   Symptoms none   Duration Progress to 30 minutes of  aerobic without signs/symptoms of physical distress   Intensity THRR unchanged     Progression   Progression Continue to progress workloads to maintain intensity without signs/symptoms of physical distress.   Average METs 2.2     Resistance Training   Training Prescription Yes   Weight 2lbs   Reps 10-15     Interval Training   Interval Training No     Recumbant Bike   Level 2   Minutes 10   METs 2     NuStep   Level 3   SPM 70   Minutes 10   METs 2     Track   Laps 11   Minutes 10   METs 2.74     Home Exercise Plan   Plans to continue exercise at Home (comment)   Frequency Add 1 additional day to program exercise sessions.   Initial Home Exercises Provided 04/24/17      Nutrition:  Target Goals: Understanding of nutrition guidelines, daily intake of sodium 1500mg , cholesterol 200mg , calories 30% from fat and 7% or less from saturated fats, daily to have 5 or more servings of fruits and vegetables.  Biometrics:  Pre Biometrics -  03/19/17 1232      Pre Biometrics   Height 5' 7.5" (1.715 m)   Weight 174 lb 6.1 oz (79.1 kg)   Waist Circumference 41 inches   Hip Circumference 40 inches   Waist to Hip Ratio 1.02 %   BMI (Calculated) 27   Triceps Skinfold 18 mm   % Body Fat 29.2 %   Grip Strength 23 kg   Flexibility 9.75 in   Single Leg Stand 1.2 seconds       Nutrition Therapy Plan and Nutrition Goals:     Nutrition Therapy & Goals - 03/19/17 1222      Nutrition Therapy   Diet Therapeutic Lifestyle Changes     Personal Nutrition Goals   Nutrition Goal Pt to identify food quantities necessary to achieve weight loss of 6-10 lb at graduation from cardiac rehab. Goal wt of 160 lb desired.      Intervention Plan   Intervention Prescribe, educate and counsel regarding individualized specific dietary modifications aiming towards targeted core components such as weight, hypertension, lipid management, diabetes, heart failure and other comorbidities.   Expected Outcomes Short Term Goal: Understand basic principles of dietary content, such as calories, fat, sodium, cholesterol and nutrients.;Long Term Goal: Adherence to prescribed nutrition plan.      Nutrition Discharge: Nutrition Scores:     Nutrition Assessments - 03/19/17 1123      MEDFICTS Scores   Pre Score 72      Nutrition Goals Re-Evaluation:   Nutrition Goals Re-Evaluation:   Nutrition Goals Discharge (Final Nutrition Goals Re-Evaluation):   Psychosocial: Target Goals: Acknowledge presence or absence of significant depression and/or stress, maximize coping skills, provide positive support system. Participant is able to verbalize types and ability to use techniques and skills needed for reducing stress and depression.  Initial Review & Psychosocial Screening:     Initial Psych Review & Screening - 03/19/17 1230      Initial Review   Current issues with None Identified     Family Dynamics   Good Support System? Yes     Barriers    Psychosocial barriers to participate in program There are no identifiable barriers or psychosocial needs.     Screening Interventions   Interventions Encouraged to exercise      Quality of Life Scores:     Quality of Life - 03/19/17 1035      Quality of Life Scores   Health/Function Pre 27.83 %   Socioeconomic Pre 28.21 %   Psych/Spiritual Pre 28.36 %   Family Pre 27.3 %   GLOBAL Pre 27.94 %      PHQ-9: Recent Review Flowsheet Data    Depression screen [facility] 2/9 04/17/2017 04/04/2017 03/29/2017 03/28/2017 02/21/2017   Decreased Interest 0 0 0 0 0   Down, Depressed, Hopeless 0 0 0 0 0   PHQ - 2 Score 0 0 0 0 0     Interpretation of Total Score  Total Score Depression Severity:  1-4 = Minimal depression, 5-9 = Mild depression, 10-14 = Moderate depression, 15-19 = Moderately severe depression, 20-27 = Severe depression   Psychosocial Evaluation and Intervention:   Psychosocial Re-Evaluation:     Psychosocial Re-Evaluation    [place] Name 04/04/17 1215 04/30/17 1400           Psychosocial Re-Evaluation   Current issues with None Identified None Identified      Interventions Encouraged to attend Cardiac Rehabilitation for the exercise Encouraged to attend Cardiac Rehabilitation  for the exercise      Continue Psychosocial Services  No Follow up required No Follow up required         Psychosocial Discharge (Final Psychosocial Re-Evaluation):     Psychosocial Re-Evaluation - 04/30/17 1400      Psychosocial Re-Evaluation   Current issues with None Identified   Interventions Encouraged to attend Cardiac Rehabilitation for the exercise   Continue Psychosocial Services  No Follow up required      Vocational Rehabilitation: Provide vocational rehab assistance to qualifying candidates.   Vocational Rehab Evaluation & Intervention:     Vocational Rehab - 03/19/17 1231      Initial Vocational Rehab Evaluation & Intervention   Assessment shows need for Vocational  Rehabilitation --  Mr Cavanah is retired      Education: Education Goals: Education classes will be provided on a weekly basis, covering required topics. Participant will state understanding/return demonstration of topics presented.  Learning Barriers/Preferences:     Learning Barriers/Preferences - 03/19/17 0818      Learning Barriers/Preferences   Learning Barriers Sight   Learning Preferences Written Material      Education Topics: Count Your Pulse:  -Group instruction provided by verbal instruction, demonstration, patient participation and written materials to support subject.  Instructors address importance of being able to find your pulse and how to count your pulse when at home without a heart monitor.  Patients get hands on experience counting their pulse with staff help and individually.   CARDIAC REHAB PHASE II EXERCISE from 04/26/2017 in [place]  Date  04/26/17  Instruction Review Code  2- meets goals/outcomes      Heart Attack, Angina, and Risk Factor Modification:  -Group instruction provided by verbal instruction, video, and written materials to support subject.  Instructors address signs and symptoms of angina and heart attacks.    Also discuss risk factors for heart disease and how to make changes to improve heart health risk factors.   Functional Fitness:  -Group instruction provided by verbal instruction, demonstration, patient participation, and written materials to support subject.  Instructors address safety measures for doing things around the house.  Discuss how to get up and down off the floor, how to pick things up properly, how to safely get out of a chair without assistance, and balance training.   CARDIAC REHAB PHASE II EXERCISE from 04/26/2017 in [place]  Date  04/05/17  Instruction Review Code  2- meets goals/outcomes      Meditation and Mindfulness:  -Group instruction provided by  verbal instruction, patient participation, and written materials to support subject.  Instructor addresses importance of mindfulness and meditation practice to help reduce stress and improve awareness.  Instructor also leads participants through a meditation exercise.    CARDIAC REHAB PHASE II EXERCISE from 04/26/2017 in [place]  Date  04/03/17  Instruction Review Code  2- meets goals/outcomes      Stretching for Flexibility and Mobility:  -Group instruction provided by verbal instruction, patient participation, and written materials to support subject.  Instructors lead participants through series of stretches that are designed to increase flexibility thus improving mobility.  These stretches are additional exercise for major muscle groups that are typically performed during regular warm up and cool down.   Hands Only CPR:  -Group verbal, video, and participation provides a basic overview of AHA guidelines for community CPR. Role-play of emergencies allow participants the opportunity to  practice calling for help and chest compression technique with discussion of AED use.   Hypertension: -Group verbal and written instruction that provides a basic overview of hypertension including the most recent diagnostic guidelines, risk factor reduction with self-care instructions and medication management.    Nutrition I class: Heart Healthy Eating:  -Group instruction provided by PowerPoint slides, verbal discussion, and written materials to support subject matter. The instructor gives an explanation and review of the Therapeutic Lifestyle Changes diet recommendations, which includes a discussion on lipid goals, dietary fat, sodium, fiber, plant stanol/sterol esters, sugar, and the components of a well-balanced, healthy diet.   CARDIAC REHAB PHASE II EXERCISE from 04/26/2017 in [place]  Date  03/19/17  Educator  RD  Instruction Review  Code  Not applicable      Nutrition II class: Lifestyle Skills:  -Group instruction provided by PowerPoint slides, verbal discussion, and written materials to support subject matter. The instructor gives an explanation and review of label reading, grocery shopping for heart health, heart healthy recipe modifications, and ways to make healthier choices when eating out.   CARDIAC REHAB PHASE II EXERCISE from 04/26/2017 in [place]  Date  03/19/17  Educator  RD  Instruction Review Code  Not applicable      Diabetes Question & Answer:  -Group instruction provided by PowerPoint slides, verbal discussion, and written materials to support subject matter. The instructor gives an explanation and review of diabetes co-morbidities, pre- and post-prandial blood glucose goals, pre-exercise blood glucose goals, signs, symptoms, and treatment of hypoglycemia and hyperglycemia, and foot care basics.   Diabetes Blitz:  -Group instruction provided by PowerPoint slides, verbal discussion, and written materials to support subject matter. The instructor gives an explanation and review of the physiology behind type 1 and type 2 diabetes, diabetes medications and rational behind using different medications, pre- and post-prandial blood glucose recommendations and Hemoglobin A1c goals, diabetes diet, and exercise including blood glucose guidelines for exercising safely.    Portion Distortion:  -Group instruction provided by PowerPoint slides, verbal discussion, written materials, and food models to support subject matter. The instructor gives an explanation of serving size versus portion size, changes in portions sizes over the last 20 years, and what consists of a serving from each food group.   Stress Management:  -Group instruction provided by verbal instruction, video, and written materials to support subject matter.  Instructors review role of stress in heart disease and how to  cope with stress positively.     CARDIAC REHAB PHASE II EXERCISE from 04/26/2017 in [place]  Date  04/17/17  Instruction Review Code  2- meets goals/outcomes      Exercising on Your Own:  -Group instruction provided by verbal instruction, power point, and written materials to support subject.  Instructors discuss benefits of exercise, components of exercise, frequency and intensity of exercise, and end points for exercise.  Also discuss use of nitroglycerin and activating EMS.  Review options of places to exercise outside of rehab.  Review guidelines for sex with heart disease.   CARDIAC REHAB PHASE II EXERCISE from 04/26/2017 in [place]  Date  04/24/17  Educator  EP  Instruction Review Code  2- meets goals/outcomes      Cardiac Drugs I:  -Group instruction provided by verbal instruction and written materials to support subject.  Instructor reviews cardiac drug classes: antiplatelets, anticoagulants, beta blockers, and statins.  Instructor discusses reasons, side effects, and lifestyle considerations for each drug class.   CARDIAC REHAB PHASE II EXERCISE from 04/26/2017 in [place]  Date  03/27/17  Instruction Review Code  2- meets goals/outcomes      Cardiac Drugs II:  -Group instruction provided by verbal instruction and written materials to support subject.  Instructor reviews cardiac drug classes: angiotensin converting enzyme inhibitors (ACE-I), angiotensin II receptor blockers (ARBs), nitrates, and calcium channel blockers.  Instructor discusses reasons, side effects, and lifestyle considerations for each drug class.   Anatomy and Physiology of the Circulatory System:  Group verbal and written instruction and models provide basic cardiac anatomy and physiology, with the coronary electrical and arterial systems. Review of: AMI, Angina, Valve disease, Heart Failure, Peripheral Artery  Disease, Cardiac Arrhythmia, Pacemakers, and the ICD.   Other Education:  -Group or individual verbal, written, or video instructions that support the educational goals of the cardiac rehab program.   Knowledge Questionnaire Score:     Knowledge Questionnaire Score - 03/19/17 1035      Knowledge Questionnaire Score   Pre Score 23/24      Core Components/Risk Factors/Patient Goals at Admission:     Personal Goals and Risk Factors at Admission - 03/19/17 1228      Core Components/Risk Factors/Patient Goals on Admission   Hypertension Yes   Intervention Provide education on lifestyle modifcations including regular physical activity/exercise, weight management, moderate sodium restriction and increased consumption of fresh fruit, vegetables, and low fat dairy, alcohol moderation, and smoking cessation.;Monitor prescription use compliance.   Lipids Yes   Intervention Provide education and support for participant on nutrition & aerobic/resistive exercise along with prescribed medications to achieve LDL 70mg , HDL >40mg .   Expected Outcomes Short Term: Participant states understanding of desired cholesterol values and is compliant with medications prescribed. Participant is following exercise prescription and nutrition guidelines.;Long Term: Cholesterol controlled with medications as prescribed, with individualized exercise RX and with personalized nutrition plan. Value goals: LDL < 70mg , HDL > 40 mg.      Core Components/Risk Factors/Patient Goals Review:      Goals and Risk Factor Review    Row Name 04/30/17 1407             Core Components/Risk Factors/Patient Goals Review   Personal Goals Review Hypertension;Lipids       Review [place] blood pressures have been stable at cardiac rehab       Expected Outcomes Patient will continue to come to exercise at cardiac rehab. Will continue to monitor blood pressures at cardiac rehab.          Core Components/Risk Factors/Patient  Goals at Discharge (Final Review):      Goals and Risk Factor Review - 04/30/17 1407      Core Components/Risk Factors/Patient Goals Review   Personal Goals Review Hypertension;Lipids   Review [place] blood pressures have been stable at cardiac rehab   Expected Outcomes Patient will continue to come to exercise at cardiac rehab. Will continue to monitor blood pressures at cardiac rehab.      ITP Comments:     ITP Comments    Row Name 03/19/17 0748           ITP Comments Medical Director- Dr. Fransico Him, MD          Comments: Lazar is making expected progress toward personal goals after completing 12 sessions. Recommend continued exercise and life style modification education including  stress management and relaxation techniques  to decrease cardiac risk profile. Barnet Pall, RN,BSN 04/30/2017 2:14 PM

## 2017-04-30 NOTE — Progress Notes (Signed)
Cardiology Office Note    Date:  05/09/2017   ID:  [name], DOB [birth year], MRN [id number]  PCP:  Helane Rima, MD  Cardiologist:  Dr. Johnsie Cancel  No chief complaint on file.   History of Present Illness:  [name] is a 83 y.o. male with history of CAD S/P DES RCA Complicated by right radial AV fistula, carotid stenosis status post carotid endarterectomy, hypertension, HLD, and CVA.  He was discharged 02/21/17 following an admission for unstable angina. Left heart cath revealed stable severe CAD moderate distal RCA, occluded circumflex, high-grade ostial proximal and mid ramus stenosis and scattered noncritical LAD disease and a patent RCA stent. He was discharged home on medical therapy.  Patient returned to the hospital with recurrent chest pain ST elevation in aVR and inferolateral ST depression. Troponin peaked at 0.31. Patient was placed on Ranexa and Imdur but continued to have chest pain. He underwent repeat cardiac catheterization and successful Rotablator guided PCI and overlapping DES of the ramus 1 lesions. Lifelong DAPT recommended. Not on beta blockers due to history of AV block in the past.  Doing well at cardiac rehab No angina has a mix dog shepherd/akita he has not gotten back to walking him yet Follows with [facility] for carotid disease. Compliant with meds   Past Medical History:  Diagnosis Date  . AV BLOCK, 1ST DEGREE   . Carotid artery disease ([place])    a. s/p Left ECA followed by Dr. Donnetta Hutching  . Coronary artery disease   . Coronary artery disease involving coronary bypass graft of native heart with angina pectoris ([place])    a. LHC 12/2015  3vd Left Cx 100% with colaterals, and distally LAD occluded, DES to prox-mid RCA  b. 02/2017: repeat cath with patent RCA stent and stable CAD; 02/27/2017: Rota-PCI to Ost RI  . GERD (gastroesophageal reflux disease)   . HLD (hyperlipidemia)   . HTN (hypertension)   . NSVT (nonsustained ventricular tachycardia) ([place])    during stress  test 12/2015  . Presence of drug coated stent in RCA & Ramus Intermedius. 02/28/2017   12/2015: PCI p-mRCA - Stent Resolute Integ 3.5x34 02/2017: Rota-PCI o-mRamus - overlapping DES STENT SYNERGY DES 3X32  & 3x24 (tapered post-dilation)  . Stroke [facility])     Past Surgical History:  Procedure Laterality Date  . CARDIAC CATHETERIZATION     x2  . CARDIAC CATHETERIZATION N/A 01/11/2016   Procedure: Left Heart Cath and Coronary Angiography;  Surgeon: Wellington Hampshire, MD;  Location: [facility];  Service: Cardiovascular;  Laterality: N/A;  . CAROTID ENDARTERECTOMY  11/28/2006   left  . CORONARY ATHERECTOMY N/A 02/27/2017   Procedure: Coronary Atherectomy;  Surgeon: Troy Sine, MD;  Location: [facility];  Service: Cardiovascular;  Laterality: N/A;  . HERNIA REPAIR    . LEFT HEART CATH AND CORONARY ANGIOGRAPHY N/A 02/21/2017   Procedure: Left Heart Cath and Coronary Angiography;  Surgeon: Lorretta Harp, MD;  Location: [facility];  Service: Cardiovascular;  Laterality: N/A;  . TONSILLECTOMY      Current Medications: Current Meds  Medication Sig  . acetaminophen (TYLENOL) 500 MG tablet Take 1 tablet (500 mg total) by mouth every 6 (six) hours as needed for moderate pain (wrist).  Marland Kitchen amLODipine (NORVASC) 10 MG tablet Take 1 tablet (10 mg total) by mouth daily.  Marland Kitchen aspirin EC 81 MG EC tablet Take 1 tablet (81 mg total) by mouth daily.  Marland Kitchen b complex vitamins tablet  Take 1 tablet by mouth daily.  . Calcium Carbonate (CALCIUM 500 PO) Take 1 tablet by mouth daily.  . Cholecalciferol (VITAMIN D-3) 1000 units CAPS Take 1 capsule by mouth daily.  . clopidogrel (PLAVIX) 75 MG tablet Take 1 tablet (75 mg total) by mouth daily with breakfast.  . Glucosamine 500 MG CAPS Take 1 capsule by mouth daily.   . isosorbide mononitrate (IMDUR) 60 MG 24 hr tablet Take 1.5 tablets (90 mg total) by mouth daily.  Marland Kitchen lisinopril (PRINIVIL,ZESTRIL) 40 MG tablet Take 40 mg by mouth daily.  . Multiple  Vitamin (MULTIVITAMIN) capsule Take 1 capsule by mouth daily.  . nitroGLYCERIN (NITROSTAT) 0.4 MG SL tablet place 1 tablet under the tongue if needed every 5 minutes fo  . Omega-3 Fatty Acids (FISH OIL) 1000 MG CAPS Take 1 capsule by mouth daily.  Marland Kitchen OVER THE COUNTER MEDICATION 600 mg as needed.  . ranolazine (RANEXA) 1000 MG SR tablet Take 1 tablet (1,000 mg total) by mouth 2 (two) times daily.  . simvastatin (ZOCOR) 20 MG tablet Take 1 tablet (20 mg total) by mouth daily.  . vitamin C (ASCORBIC ACID) 500 MG tablet Take 500 mg by mouth daily.       Allergies:   Patient has no known allergies.   Social History   Social History  . Marital status: Married    Spouse name: N/A  . Number of children: N/A  . Years of education: N/A   Social History Main Topics  . Smoking status: Never Smoker  . Smokeless tobacco: Never Used  . Alcohol use 9.0 oz/week    15 Glasses of wine per week  . Drug use: No  . Sexual activity: Not Asked   Other Topics Concern  . None   Social History Narrative  . None     Family History:  The patient's  family history includes Cancer (age of onset: 51) in his sister; Diabetes in his mother and sister; Heart disease in his sister.   ROS:   Please see the history of present illness.    Review of Systems  Constitution: Negative.  HENT: Negative.   Cardiovascular: Negative.   Respiratory: Negative.   Endocrine: Negative.   Hematologic/Lymphatic: Negative.   Musculoskeletal: Negative.   Gastrointestinal: Negative.   Genitourinary: Negative.   Neurological: Negative.    All other systems reviewed and are negative.   PHYSICAL EXAM:   VS:  BP (!) 106/58   Pulse 60   Ht 5\' 7"  (1.702 m)   Wt 169 lb (76.7 kg)   BMI 26.47 kg/m   Physical Exam  GEN: Elderly, in no acute distress  Neck: no JVD, post left CEA with right bruit  Cardiac:RRR; Positive S4 and 1/6 systolic murmur at the left sternal border Respiratory:  clear to auscultation bilaterally,  normal work of breathing GI: soft, nontender, nondistended, + BS Ext: Right groin without hematoma or hemorrhage at cath site otherwise lower extremities without cyanosis, clubbing, or edema, Good distal pulses bilaterally right radial artery with thrill from prior AV fistula Neuro:  Alert and Oriented x 3 Psych: euthymic mood, full affect Prominent varicosities in right arm   Wt Readings from Last 3 Encounters:  05/09/17 169 lb (76.7 kg)  04/17/17 169 lb (76.7 kg)  04/04/17 168 lb 9.6 oz (76.5 kg)      Studies/Labs Reviewed:   EKG:  EKG is ordered today.  The ekg ordered today demonstrates Normal sinus rhythm with first-degree AV block, PVC  Recent Labs: 02/24/2017: Magnesium 1.8 02/28/2017: ALT 142; BUN 23; Creatinine, Ser 1.32; Hemoglobin 10.9; Platelets 170; Potassium 4.2; Sodium 138   Lipid Panel    Component Value Date/Time   CHOL 109 02/21/2017 0204   TRIG 52 02/21/2017 0204   HDL 46 02/21/2017 0204   CHOLHDL 2.4 02/21/2017 0204   VLDL 10 02/21/2017 0204   LDLCALC 53 02/21/2017 0204    Additional studies/ records that were reviewed today include:   LHC: 02/27/17   Conclusion       Prox RCA to Mid RCA lesion, 0 %stenosed.  Mid RCA lesion, 30 %stenosed.  Dist RCA lesion, 70 %stenosed.  Ost Cx to Prox Cx lesion, 100 %stenosed.  Dist LAD lesion, 100 %stenosed.  Mid LAD lesion, 50 %stenosed.  Ost LAD to Prox LAD lesion, 60 %stenosed.  Ramus-2 lesion, 70 %stenosed.  A STENT SYNERGY DES 3X32 drug eluting stent was successfully placed.  Ramus-1 lesion, 80 %stenosed.  Post intervention, there is a 0% residual stenosis.  A STENT SYNERGY DES 3X24 drug eluting stent was successfully placed, and overlaps previously placed stent.  Ost Ramus lesion, 95 %stenosed.  Post intervention, there is a 0% residual stenosis.   Successful complex PCI to the ramus intermediate vessel which had a 95% ostial stenosis followed by diffuse 80% proximal stenosis and 70% mid  stenosis.  The three lesion were treated with high-speed rotational atherectomy with a 1.5 mm and a 1.75 mm burr,  cutting balloon atherotomy with a 3.010 mm Wolverine balloon, and tandem DES stenting with a 3.032 mm and 3.024 mm Synergy DES stents postdilated to 3.28 m millimeters ostially to 3.21 mm distally with the entire segments being reduced to 0%.    RECOMMENDATION: Lifelong DAPT.  Continue aggressive medical therapy for the patient's severe concomitant CAD.       ASSESSMENT:    1. Essential hypertension   2. Coronary artery disease involving native coronary artery of native heart with angina pectoris (HCC)      PLAN:  In order of problems listed above:  CAD status post PCI with high-speed rotational atherectomy in overlapping drug-eluting stents to the ramus 02/27/17. Currently without angina. Recommend cardiac rehabilitation. Continue Imdur and Ranexa, Plavix and aspirin patent Stent to mid RCA. Residual distal RCA and occluded distal LAD   HTN:   continue amlodipine 10 mg daily  HLD  continue simvastatin.  Carotid:  F/u [facility] post left CEA duplex 11/2016 1-39% bilateral plaque    Jenkins Rouge

## 2017-05-01 ENCOUNTER — Encounter (HOSPITAL_COMMUNITY)
Admission: RE | Admit: 2017-05-01 | Discharge: 2017-05-01 | Disposition: A | Discharge status: Home / Self Care | Payer: Medicare Other | Source: Ambulatory Visit | Attending: Cardiovascular Disease | Admitting: Cardiovascular Disease

## 2017-05-01 DIAGNOSIS — Z48812 Encounter for surgical aftercare following surgery on the circulatory system: Secondary | ICD-10-CM | POA: Diagnosis not present

## 2017-05-01 DIAGNOSIS — Z9889 Other specified postprocedural states: Secondary | ICD-10-CM

## 2017-05-01 DIAGNOSIS — Z955 Presence of coronary angioplasty implant and graft: Secondary | ICD-10-CM

## 2017-05-03 ENCOUNTER — Encounter (HOSPITAL_COMMUNITY)
Admission: RE | Admit: 2017-05-03 | Discharge: 2017-05-03 | Disposition: A | Discharge status: Home / Self Care | Payer: Medicare Other | Source: Ambulatory Visit | Attending: Cardiovascular Disease | Admitting: Cardiovascular Disease

## 2017-05-03 DIAGNOSIS — Z9889 Other specified postprocedural states: Secondary | ICD-10-CM

## 2017-05-03 DIAGNOSIS — Z48812 Encounter for surgical aftercare following surgery on the circulatory system: Secondary | ICD-10-CM | POA: Diagnosis not present

## 2017-05-03 DIAGNOSIS — Z955 Presence of coronary angioplasty implant and graft: Secondary | ICD-10-CM

## 2017-05-06 ENCOUNTER — Encounter (HOSPITAL_COMMUNITY)
Admission: RE | Admit: 2017-05-06 | Discharge: 2017-05-06 | Disposition: A | Discharge status: Home / Self Care | Payer: Medicare Other | Source: Ambulatory Visit | Attending: Cardiovascular Disease | Admitting: Cardiovascular Disease

## 2017-05-06 DIAGNOSIS — Z9889 Other specified postprocedural states: Secondary | ICD-10-CM

## 2017-05-06 DIAGNOSIS — Z955 Presence of coronary angioplasty implant and graft: Secondary | ICD-10-CM

## 2017-05-06 DIAGNOSIS — Z48812 Encounter for surgical aftercare following surgery on the circulatory system: Secondary | ICD-10-CM | POA: Diagnosis not present

## 2017-05-08 ENCOUNTER — Encounter (HOSPITAL_COMMUNITY)
Admission: RE | Admit: 2017-05-08 | Discharge: 2017-05-08 | Disposition: A | Discharge status: Home / Self Care | Payer: Medicare Other | Source: Ambulatory Visit | Attending: Cardiovascular Disease | Admitting: Cardiovascular Disease

## 2017-05-08 DIAGNOSIS — Z9889 Other specified postprocedural states: Secondary | ICD-10-CM

## 2017-05-08 DIAGNOSIS — Z48812 Encounter for surgical aftercare following surgery on the circulatory system: Secondary | ICD-10-CM | POA: Diagnosis not present

## 2017-05-08 DIAGNOSIS — Z955 Presence of coronary angioplasty implant and graft: Secondary | ICD-10-CM

## 2017-05-09 ENCOUNTER — Ambulatory Visit (INDEPENDENT_AMBULATORY_CARE_PROVIDER_SITE_OTHER): Payer: Medicare Other | Admitting: Cardiovascular Disease

## 2017-05-09 ENCOUNTER — Encounter: Payer: Self-pay | Admitting: Cardiovascular Disease

## 2017-05-09 VITALS — BP 106/58 | HR 60 | Ht 67.0 in | Wt 169.0 lb

## 2017-05-09 DIAGNOSIS — I10 Essential (primary) hypertension: Secondary | ICD-10-CM | POA: Diagnosis not present

## 2017-05-09 DIAGNOSIS — I6523 Occlusion and stenosis of bilateral carotid arteries: Secondary | ICD-10-CM | POA: Diagnosis not present

## 2017-05-09 DIAGNOSIS — I209 Angina pectoris, unspecified: Secondary | ICD-10-CM

## 2017-05-09 DIAGNOSIS — I25119 Atherosclerotic heart disease of native coronary artery with unspecified angina pectoris: Secondary | ICD-10-CM | POA: Diagnosis not present

## 2017-05-09 NOTE — Patient Instructions (Addendum)

## 2017-05-10 ENCOUNTER — Encounter (HOSPITAL_COMMUNITY)
Admission: RE | Admit: 2017-05-10 | Discharge: 2017-05-10 | Disposition: A | Discharge status: Home / Self Care | Payer: Medicare Other | Source: Ambulatory Visit | Attending: Cardiovascular Disease | Admitting: Cardiovascular Disease

## 2017-05-10 DIAGNOSIS — Z955 Presence of coronary angioplasty implant and graft: Secondary | ICD-10-CM

## 2017-05-10 DIAGNOSIS — Z48812 Encounter for surgical aftercare following surgery on the circulatory system: Secondary | ICD-10-CM | POA: Diagnosis not present

## 2017-05-10 DIAGNOSIS — Z9889 Other specified postprocedural states: Secondary | ICD-10-CM

## 2017-05-13 ENCOUNTER — Encounter (HOSPITAL_COMMUNITY)
Admission: RE | Admit: 2017-05-13 | Discharge: 2017-05-13 | Disposition: A | Discharge status: Home / Self Care | Payer: Medicare Other | Source: Ambulatory Visit | Attending: Cardiovascular Disease | Admitting: Cardiovascular Disease

## 2017-05-13 DIAGNOSIS — Z9889 Other specified postprocedural states: Secondary | ICD-10-CM

## 2017-05-13 DIAGNOSIS — Z48812 Encounter for surgical aftercare following surgery on the circulatory system: Secondary | ICD-10-CM | POA: Diagnosis not present

## 2017-05-13 DIAGNOSIS — Z955 Presence of coronary angioplasty implant and graft: Secondary | ICD-10-CM

## 2017-05-15 ENCOUNTER — Encounter (HOSPITAL_COMMUNITY)
Admission: RE | Admit: 2017-05-15 | Discharge: 2017-05-15 | Disposition: A | Discharge status: Home / Self Care | Payer: Medicare Other | Source: Ambulatory Visit | Attending: Cardiovascular Disease | Admitting: Cardiovascular Disease

## 2017-05-15 DIAGNOSIS — Z48812 Encounter for surgical aftercare following surgery on the circulatory system: Secondary | ICD-10-CM | POA: Diagnosis not present

## 2017-05-15 DIAGNOSIS — Z955 Presence of coronary angioplasty implant and graft: Secondary | ICD-10-CM

## 2017-05-15 DIAGNOSIS — Z9889 Other specified postprocedural states: Secondary | ICD-10-CM

## 2017-05-17 ENCOUNTER — Encounter (HOSPITAL_COMMUNITY)
Admission: RE | Admit: 2017-05-17 | Discharge: 2017-05-17 | Disposition: A | Discharge status: Home / Self Care | Payer: Medicare Other | Source: Ambulatory Visit | Attending: Cardiovascular Disease | Admitting: Cardiovascular Disease

## 2017-05-17 DIAGNOSIS — Z955 Presence of coronary angioplasty implant and graft: Secondary | ICD-10-CM

## 2017-05-17 DIAGNOSIS — Z48812 Encounter for surgical aftercare following surgery on the circulatory system: Secondary | ICD-10-CM | POA: Diagnosis not present

## 2017-05-17 DIAGNOSIS — Z9889 Other specified postprocedural states: Secondary | ICD-10-CM

## 2017-05-20 ENCOUNTER — Encounter (HOSPITAL_COMMUNITY)
Admission: RE | Admit: 2017-05-20 | Discharge: 2017-05-20 | Disposition: A | Discharge status: Home / Self Care | Payer: Medicare Other | Source: Ambulatory Visit | Attending: Cardiovascular Disease | Admitting: Cardiovascular Disease

## 2017-05-20 DIAGNOSIS — Z48812 Encounter for surgical aftercare following surgery on the circulatory system: Secondary | ICD-10-CM | POA: Insufficient documentation

## 2017-05-20 DIAGNOSIS — Z9889 Other specified postprocedural states: Secondary | ICD-10-CM

## 2017-05-20 DIAGNOSIS — Z955 Presence of coronary angioplasty implant and graft: Secondary | ICD-10-CM | POA: Diagnosis not present

## 2017-05-22 ENCOUNTER — Encounter (HOSPITAL_COMMUNITY)
Admission: RE | Admit: 2017-05-22 | Discharge: 2017-05-22 | Disposition: A | Discharge status: Home / Self Care | Payer: Medicare Other | Source: Ambulatory Visit | Attending: Cardiovascular Disease | Admitting: Cardiovascular Disease

## 2017-05-22 DIAGNOSIS — Z9889 Other specified postprocedural states: Secondary | ICD-10-CM

## 2017-05-22 DIAGNOSIS — Z955 Presence of coronary angioplasty implant and graft: Secondary | ICD-10-CM

## 2017-05-24 ENCOUNTER — Encounter (HOSPITAL_COMMUNITY): Payer: Medicare Other

## 2017-05-24 ENCOUNTER — Telehealth (HOSPITAL_COMMUNITY): Payer: Self-pay | Admitting: *Deleted

## 2017-05-27 ENCOUNTER — Encounter (HOSPITAL_COMMUNITY)
Admission: RE | Admit: 2017-05-27 | Discharge: 2017-05-27 | Disposition: A | Discharge status: Home / Self Care | Payer: Medicare Other | Source: Ambulatory Visit | Attending: Cardiovascular Disease | Admitting: Cardiovascular Disease

## 2017-05-27 ENCOUNTER — Encounter (HOSPITAL_COMMUNITY): Payer: Medicare Other

## 2017-05-27 DIAGNOSIS — Z955 Presence of coronary angioplasty implant and graft: Secondary | ICD-10-CM

## 2017-05-27 DIAGNOSIS — Z48812 Encounter for surgical aftercare following surgery on the circulatory system: Secondary | ICD-10-CM | POA: Diagnosis not present

## 2017-05-27 DIAGNOSIS — Z9889 Other specified postprocedural states: Secondary | ICD-10-CM

## 2017-05-27 NOTE — Progress Notes (Signed)
Hartley returned to exercise today per Dr Emogene Morgan Daws, Orthopedics and exercised without difficulty today.Will continue to monitor the patient throughout  the program.

## 2017-05-28 NOTE — Progress Notes (Signed)
Cardiac Individual Treatment Plan  Patient Details  Name: [name] MRN: [id number] Date of Birth: Jun 22, 1928 Referring Provider:     CARDIAC REHAB PHASE II ORIENTATION from 03/19/2017 in [place]  Referring Provider  Jenkins Rouge MD      Initial Encounter Date:    CARDIAC REHAB PHASE II ORIENTATION from 03/19/2017 in [place]  Date  03/19/17  Referring Provider  Jenkins Rouge MD      Visit Diagnosis: 02/27/17 atherectomy  02/27/17 DES x2  Patient's Home Medications on Admission:  Current Outpatient Prescriptions:  .  acetaminophen (TYLENOL) 500 MG tablet, Take 1 tablet (500 mg total) by mouth every 6 (six) hours as needed for moderate pain (wrist)., Disp: 30 tablet, Rfl: 0 .  amLODipine (NORVASC) 10 MG tablet, Take 1 tablet (10 mg total) by mouth daily., Disp: 90 tablet, Rfl: 3 .  aspirin EC 81 MG EC tablet, Take 1 tablet (81 mg total) by mouth daily., Disp: , Rfl:  .  b complex vitamins tablet, Take 1 tablet by mouth daily., Disp: , Rfl:  .  Calcium Carbonate (CALCIUM 500 PO), Take 1 tablet by mouth daily., Disp: , Rfl:  .  Cholecalciferol (VITAMIN D-3) 1000 units CAPS, Take 1 capsule by mouth daily., Disp: , Rfl:  .  clopidogrel (PLAVIX) 75 MG tablet, Take 1 tablet (75 mg total) by mouth daily with breakfast., Disp: 90 tablet, Rfl: 2 .  Glucosamine 500 MG CAPS, Take 1 capsule by mouth daily. , Disp: , Rfl:  .  isosorbide mononitrate (IMDUR) 60 MG 24 hr tablet, Take 1.5 tablets (90 mg total) by mouth daily., Disp: 135 tablet, Rfl: 2 .  lisinopril (PRINIVIL,ZESTRIL) 40 MG tablet, Take 40 mg by mouth daily., Disp: , Rfl:  .  Multiple Vitamin (MULTIVITAMIN) capsule, Take 1 capsule by mouth daily., Disp: , Rfl:  .  nitroGLYCERIN (NITROSTAT) 0.4 MG SL tablet, place 1 tablet under the tongue if needed every 5 minutes fo, Disp: 25 tablet, Rfl: 3 .  Omega-3 Fatty Acids (FISH OIL) 1000 MG CAPS, Take 1 capsule by mouth  daily., Disp: , Rfl:  .  OVER THE COUNTER MEDICATION, 600 mg as needed., Disp: , Rfl:  .  ranolazine (RANEXA) 1000 MG SR tablet, Take 1 tablet (1,000 mg total) by mouth 2 (two) times daily., Disp: 180 tablet, Rfl: 2 .  simvastatin (ZOCOR) 20 MG tablet, Take 1 tablet (20 mg total) by mouth daily., Disp: 90 tablet, Rfl: 3 .  vitamin C (ASCORBIC ACID) 500 MG tablet, Take 500 mg by mouth daily.  , Disp: , Rfl:   Past Medical History: Past Medical History:  Diagnosis Date  . AV BLOCK, 1ST DEGREE   . Carotid artery disease ([place])    a. s/p Left ECA followed by Dr. Donnetta Hutching  . Coronary artery disease   . Coronary artery disease involving coronary bypass graft of native heart with angina pectoris ([place])    a. LHC 12/2015  3vd Left Cx 100% with colaterals, and distally LAD occluded, DES to prox-mid RCA  b. 02/2017: repeat cath with patent RCA stent and stable CAD; 02/27/2017: Rota-PCI to Ost [place]  . GERD (gastroesophageal reflux disease)   . HLD (hyperlipidemia)   . HTN (hypertension)   . NSVT (nonsustained ventricular tachycardia) ([place])    during stress test 12/2015  . Presence of drug coated stent in RCA & Ramus Intermedius. 02/28/2017   12/2015: PCI p-mRCA - Stent Resolute Integ 3.5x34 02/2017:  Rota-PCI o-mRamus - overlapping DES STENT SYNERGY DES 3X32  & 3x24 (tapered post-dilation)  . Stroke [facility])     Tobacco Use: History  Smoking Status  . Never Smoker  Smokeless Tobacco  . Never Used    Labs: Recent Review Flowsheet Data    Labs for ITP Cardiac and Pulmonary Rehab Latest Ref Rng & Units 05/24/2010 01/11/2016 01/12/2016 02/21/2017   Cholestrol 0 - 200 mg/dL 139 - 120 109   LDLCALC 0 - 99 mg/dL 70 - 48 53   HDL >40 mg/dL 51.30 - 52 46   Trlycerides <150 mg/dL 87.0 - 98 52   Hemoglobin A1c 4.8 - 5.6 % - 6.0(H) - -      Capillary Blood Glucose: No results found for: GLUCAP   Exercise Target Goals:    Exercise Program Goal: Individual exercise prescription set with THRR, safety & activity  barriers. Participant demonstrates ability to understand and report RPE using BORG scale, to self-measure pulse accurately, and to acknowledge the importance of the exercise prescription.  Exercise Prescription Goal: Starting with aerobic activity 30 plus minutes a day, 3 days per week for initial exercise prescription. Provide home exercise prescription and guidelines that participant acknowledges understanding prior to discharge.  Activity Barriers & Risk Stratification:     Activity Barriers & Cardiac Risk Stratification - 03/19/17 0819      Activity Barriers & Cardiac Risk Stratification   Activity Barriers Arthritis;Deconditioning;Muscular Weakness;Balance Concerns   Cardiac Risk Stratification High      6 Minute Walk:     6 Minute Walk    Row Name 03/19/17 1038         6 Minute Walk   Phase Initial     Distance 12173 feet     Walk Time 6 minutes     # of Rest Breaks 0     MPH 2.42     METS 1.66     RPE 13     VO2 Peak 5.8     Symptoms Yes (comment)     Comments pt c/o "woozy/dizzy" with walk test. Pt did not eat breakfast, given light snack (banana/PB crackers, ginger ale) symptoms resolved.     Resting HR 59 bpm     Resting BP 118/60     Max Ex. HR 72 bpm     Max Ex. BP 124/72     2 Minute Post BP 112/52        Oxygen Initial Assessment:   Oxygen Re-Evaluation:   Oxygen Discharge (Final Oxygen Re-Evaluation):   Initial Exercise Prescription:     Initial Exercise Prescription - 03/19/17 1000      Date of Initial Exercise RX and Referring Provider   Date 03/19/17   Referring Provider Jenkins Rouge MD     Treadmill   MPH 2   Grade 0   Minutes 10   METs 2.53     Recumbant Bike   Level 2   Minutes 10   METs 2     NuStep   Level 2   SPM 70   Minutes 10   METs 2     Prescription Details   Frequency (times per week) 3   Duration Progress to 30 minutes of continuous aerobic without signs/symptoms of physical distress     Intensity    THRR 40-80% of Max Heartrate 53-106   Ratings of Perceived Exertion 11-13   Perceived Dyspnea 0-4     Progression   Progression Continue to progress workloads to  maintain intensity without signs/symptoms of physical distress.     Resistance Training   Training Prescription Yes   Weight 2lbs   Reps 10-15      Perform Capillary Blood Glucose checks as needed.  Exercise Prescription Changes:     Exercise Prescription Changes    Row Name 04/12/17 1700 04/26/17 1700 05/07/17 1600 05/20/17 1700       Response to Exercise   Blood Pressure (Admit) 120/60 122/68 101/61 102/78    Blood Pressure (Exercise) 110/58 128/60 104/59 142/78    Blood Pressure (Exit) 122/60 118/62 118/70 104/72    Heart Rate (Admit) 77 bpm 63 bpm 92 bpm 67 bpm    Heart Rate (Exercise) 106 bpm 77 bpm 97 bpm 91 bpm    Heart Rate (Exit) 76 bpm 62 bpm 96 bpm 68 bpm    Rating of Perceived Exertion (Exercise) 13 14 14 15     Symptoms none none none none    Duration Progress to 30 minutes of  aerobic without signs/symptoms of physical distress Progress to 30 minutes of  aerobic without signs/symptoms of physical distress Progress to 30 minutes of  aerobic without signs/symptoms of physical distress Progress to 30 minutes of  aerobic without signs/symptoms of physical distress    Intensity THRR unchanged THRR unchanged THRR unchanged THRR unchanged      Progression   Progression Continue to progress workloads to maintain intensity without signs/symptoms of physical distress. Continue to progress workloads to maintain intensity without signs/symptoms of physical distress. Continue to progress workloads to maintain intensity without signs/symptoms of physical distress. Continue to progress workloads to maintain intensity without signs/symptoms of physical distress.    Average METs 2.2 2.2 2.2 2.4      Resistance Training   Training Prescription Yes Yes Yes Yes    Weight 2lbs 2lbs 2lbs 3lbs    Reps 10-15 10-15 10-15 10-15     Time  -  -  - 10 Minutes      Interval Training   Interval Training No No No No      Treadmill   MPH -  -  -  -    Grade -  -  -  -    Minutes -  -  -  -    METs -  -  -  -      Recumbant Bike   Level 2 2 3 4     Minutes 10 10 10 10     METs 2 2 2.1 2.3      NuStep   Level 2 3 3 3     SPM 70 70 70 70    Minutes 10 10 10 10     METs 1.9 2 1.8 2.3      Track   Laps 9 11 9 9     Minutes 10 10 10 10     METs 2.57 2.74 2.57 2.57      Home Exercise Plan   Plans to continue exercise at  - Home (comment) Home (comment) Home (comment)    Frequency  - Add 1 additional day to program exercise sessions. Add 1 additional day to program exercise sessions. Add 1 additional day to program exercise sessions.    Initial Home Exercises Provided  - 04/24/17 04/24/17 04/24/17       Exercise Comments:     Exercise Comments    Row Name 03/29/17 1445 04/24/17 1821 05/08/17 1742 05/22/17 1445     Exercise Comments Pt is doing well and off  to a great start with exercise! Reviewed home exercise prescription and METs. Encouraged pt to walk 30 minutes, at least 1 other day in addition to CR, and pt is agreeable to this. Reviewed METs with patient. Reviewed METs and goals with patient.       Exercise Goals and Review:     Exercise Goals    Row Name 03/19/17 [phone]             Exercise Goals   Increase Physical Activity Yes       Intervention Provide advice, education, support and counseling about physical activity/exercise needs.;Develop an individualized exercise prescription for aerobic and resistive training based on initial evaluation findings, risk stratification, comorbidities and participant's personal goals.       Expected Outcomes Achievement of increased cardiorespiratory fitness and enhanced flexibility, muscular endurance and strength shown through measurements of functional capacity and personal statement of participant.  improve walking tolerance       Increase Strength and  Stamina Yes       Intervention Provide advice, education, support and counseling about physical activity/exercise needs.;Develop an individualized exercise prescription for aerobic and resistive training based on initial evaluation findings, risk stratification, comorbidities and participant's personal goals.       Expected Outcomes Achievement of increased cardiorespiratory fitness and enhanced flexibility, muscular endurance and strength shown through measurements of functional capacity and personal statement of participant.          Exercise Goals Re-Evaluation :     Exercise Goals Re-Evaluation    Row Name 04/26/17 1744 05/22/17 1445           Exercise Goal Re-Evaluation   Exercise Goals Review Increase Physical Activity;Increase Strength and Stamina Increase Physical Activity;Increase Strength and Stamina      Comments Pt feels that his walking tolerance is better but his stength and stamina is about the same. Encouraged to walk at least 1 day in addition to CR. Pt states that he feels good with his exercise at cardiac rehab. Pt states he's walking a little with his dog and plans to increase his walking at home.      Expected Outcomes Begin walking at least 1 day at home. Increase workloads at CR as tolerated. Walk 30 minutes 1-2 days/week at home.          Discharge Exercise Prescription (Final Exercise Prescription Changes):     Exercise Prescription Changes - 05/20/17 1700      Response to Exercise   Blood Pressure (Admit) 102/78   Blood Pressure (Exercise) 142/78   Blood Pressure (Exit) 104/72   Heart Rate (Admit) 67 bpm   Heart Rate (Exercise) 91 bpm   Heart Rate (Exit) 68 bpm   Rating of Perceived Exertion (Exercise) 15   Symptoms none   Duration Progress to 30 minutes of  aerobic without signs/symptoms of physical distress   Intensity THRR unchanged     Progression   Progression Continue to progress workloads to maintain intensity without signs/symptoms of  physical distress.   Average METs 2.4     Resistance Training   Training Prescription Yes   Weight 3lbs   Reps 10-15   Time 10 Minutes     Interval Training   Interval Training No     Recumbant Bike   Level 4   Minutes 10   METs 2.3     NuStep   Level 3   SPM 70   Minutes 10   METs 2.3     Track  Laps 9   Minutes 10   METs 2.57     Home Exercise Plan   Plans to continue exercise at Home (comment)   Frequency Add 1 additional day to program exercise sessions.   Initial Home Exercises Provided 04/24/17      Nutrition:  Target Goals: Understanding of nutrition guidelines, daily intake of sodium 1500mg , cholesterol 200mg , calories 30% from fat and 7% or less from saturated fats, daily to have 5 or more servings of fruits and vegetables.  Biometrics:     Pre Biometrics - 03/19/17 1232      Pre Biometrics   Height 5' 7.5" (1.715 m)   Weight 174 lb 6.1 oz (79.1 kg)   Waist Circumference 41 inches   Hip Circumference 40 inches   Waist to Hip Ratio 1.02 %   BMI (Calculated) 27   Triceps Skinfold 18 mm   % Body Fat 29.2 %   Grip Strength 23 kg   Flexibility 9.75 in   Single Leg Stand 1.2 seconds       Nutrition Therapy Plan and Nutrition Goals:     Nutrition Therapy & Goals - 03/19/17 1222      Nutrition Therapy   Diet Therapeutic Lifestyle Changes     Personal Nutrition Goals   Nutrition Goal Pt to identify food quantities necessary to achieve weight loss of 6-10 lb at graduation from cardiac rehab. Goal wt of 160 lb desired.      Intervention Plan   Intervention Prescribe, educate and counsel regarding individualized specific dietary modifications aiming towards targeted core components such as weight, hypertension, lipid management, diabetes, heart failure and other comorbidities.   Expected Outcomes Short Term Goal: Understand basic principles of dietary content, such as calories, fat, sodium, cholesterol and nutrients.;Long Term Goal: Adherence to  prescribed nutrition plan.      Nutrition Discharge: Nutrition Scores:     Nutrition Assessments - 03/19/17 1123      MEDFICTS Scores   Pre Score 72      Nutrition Goals Re-Evaluation:   Nutrition Goals Re-Evaluation:   Nutrition Goals Discharge (Final Nutrition Goals Re-Evaluation):   Psychosocial: Target Goals: Acknowledge presence or absence of significant depression and/or stress, maximize coping skills, provide positive support system. Participant is able to verbalize types and ability to use techniques and skills needed for reducing stress and depression.  Initial Review & Psychosocial Screening:     Initial Psych Review & Screening - 03/19/17 1230      Initial Review   Current issues with None Identified     Family Dynamics   Good Support System? Yes     Barriers   Psychosocial barriers to participate in program There are no identifiable barriers or psychosocial needs.     Screening Interventions   Interventions Encouraged to exercise      Quality of Life Scores:     Quality of Life - 03/19/17 1035      Quality of Life Scores   Health/Function Pre 27.83 %   Socioeconomic Pre 28.21 %   Psych/Spiritual Pre 28.36 %   Family Pre 27.3 %   GLOBAL Pre 27.94 %      PHQ-9: Recent Review Flowsheet Data    Depression screen [facility] 2/9 04/17/2017 04/04/2017 03/29/2017 03/28/2017 02/21/2017   Decreased Interest 0 0 0 0 0   Down, Depressed, Hopeless 0 0 0 0 0   PHQ - 2 Score 0 0 0 0 0     Interpretation of Total Score  Total Score Depression Severity:  1-4 = Minimal depression, 5-9 = Mild depression, 10-14 = Moderate depression, 15-19 = Moderately severe depression, 20-27 = Severe depression   Psychosocial Evaluation and Intervention:   Psychosocial Re-Evaluation:     Psychosocial Re-Evaluation    [place] Name 04/04/17 1215 04/30/17 1400 05/28/17 1734         Psychosocial Re-Evaluation   Current issues with None Identified None Identified None Identified      Interventions Encouraged to attend Cardiac Rehabilitation for the exercise Encouraged to attend Cardiac Rehabilitation for the exercise Encouraged to attend Cardiac Rehabilitation for the exercise     Continue Psychosocial Services  No Follow up required No Follow up required No Follow up required        Psychosocial Discharge (Final Psychosocial Re-Evaluation):     Psychosocial Re-Evaluation - 05/28/17 1734      Psychosocial Re-Evaluation   Current issues with None Identified   Interventions Encouraged to attend Cardiac Rehabilitation for the exercise   Continue Psychosocial Services  No Follow up required      Vocational Rehabilitation: Provide vocational rehab assistance to qualifying candidates.   Vocational Rehab Evaluation & Intervention:     Vocational Rehab - 03/19/17 1231      Initial Vocational Rehab Evaluation & Intervention   Assessment shows need for Vocational Rehabilitation --  Mr Wojcicki is retired      Education: Education Goals: Education classes will be provided on a weekly basis, covering required topics. Participant will state understanding/return demonstration of topics presented.  Learning Barriers/Preferences:     Learning Barriers/Preferences - 03/19/17 0818      Learning Barriers/Preferences   Learning Barriers Sight   Learning Preferences Written Material      Education Topics: Count Your Pulse:  -Group instruction provided by verbal instruction, demonstration, patient participation and written materials to support subject.  Instructors address importance of being able to find your pulse and how to count your pulse when at home without a heart monitor.  Patients get hands on experience counting their pulse with staff help and individually.   CARDIAC REHAB PHASE II EXERCISE from 05/22/2017 in [place]  Date  04/26/17  Instruction Review Code  2- meets goals/outcomes      Heart Attack, Angina, and Risk  Factor Modification:  -Group instruction provided by verbal instruction, video, and written materials to support subject.  Instructors address signs and symptoms of angina and heart attacks.    Also discuss risk factors for heart disease and how to make changes to improve heart health risk factors.   CARDIAC REHAB PHASE II EXERCISE from 05/22/2017 in [place]  Date  05/08/17  Instruction Review Code  2- meets goals/outcomes      Functional Fitness:  -Group instruction provided by verbal instruction, demonstration, patient participation, and written materials to support subject.  Instructors address safety measures for doing things around the house.  Discuss how to get up and down off the floor, how to pick things up properly, how to safely get out of a chair without assistance, and balance training.   CARDIAC REHAB PHASE II EXERCISE from 05/22/2017 in [place]  Date  05/10/17  Educator  EP  Instruction Review Code  R- Review/reinforce      Meditation and Mindfulness:  -Group instruction provided by verbal instruction, patient participation, and written materials to support subject.  Instructor addresses importance of mindfulness and meditation practice to  help reduce stress and improve awareness.  Instructor also leads participants through a meditation exercise.    CARDIAC REHAB PHASE II EXERCISE from 05/22/2017 in [place]  Date  05/22/17  Instruction Review Code  2- meets goals/outcomes      Stretching for Flexibility and Mobility:  -Group instruction provided by verbal instruction, patient participation, and written materials to support subject.  Instructors lead participants through series of stretches that are designed to increase flexibility thus improving mobility.  These stretches are additional exercise for major muscle groups that are typically performed during regular warm up and  cool down.   Hands Only CPR:  -Group verbal, video, and participation provides a basic overview of AHA guidelines for community CPR. Role-play of emergencies allow participants the opportunity to practice calling for help and chest compression technique with discussion of AED use.   Hypertension: -Group verbal and written instruction that provides a basic overview of hypertension including the most recent diagnostic guidelines, risk factor reduction with self-care instructions and medication management.   CARDIAC REHAB PHASE II EXERCISE from 05/22/2017 in [place]  Date  05/17/17  Instruction Review Code  2- meets goals/outcomes       Nutrition I class: Heart Healthy Eating:  -Group instruction provided by PowerPoint slides, verbal discussion, and written materials to support subject matter. The instructor gives an explanation and review of the Therapeutic Lifestyle Changes diet recommendations, which includes a discussion on lipid goals, dietary fat, sodium, fiber, plant stanol/sterol esters, sugar, and the components of a well-balanced, healthy diet.   CARDIAC REHAB PHASE II EXERCISE from 05/22/2017 in [place]  Date  03/19/17  Educator  RD  Instruction Review Code  Not applicable      Nutrition II class: Lifestyle Skills:  -Group instruction provided by PowerPoint slides, verbal discussion, and written materials to support subject matter. The instructor gives an explanation and review of label reading, grocery shopping for heart health, heart healthy recipe modifications, and ways to make healthier choices when eating out.   CARDIAC REHAB PHASE II EXERCISE from 05/22/2017 in [place]  Date  03/19/17  Educator  RD  Instruction Review Code  Not applicable      Diabetes Question & Answer:  -Group instruction provided by PowerPoint slides, verbal discussion, and written materials to  support subject matter. The instructor gives an explanation and review of diabetes co-morbidities, pre- and post-prandial blood glucose goals, pre-exercise blood glucose goals, signs, symptoms, and treatment of hypoglycemia and hyperglycemia, and foot care basics.   Diabetes Blitz:  -Group instruction provided by PowerPoint slides, verbal discussion, and written materials to support subject matter. The instructor gives an explanation and review of the physiology behind type 1 and type 2 diabetes, diabetes medications and rational behind using different medications, pre- and post-prandial blood glucose recommendations and Hemoglobin A1c goals, diabetes diet, and exercise including blood glucose guidelines for exercising safely.    Portion Distortion:  -Group instruction provided by PowerPoint slides, verbal discussion, written materials, and food models to support subject matter. The instructor gives an explanation of serving size versus portion size, changes in portions sizes over the last 20 years, and what consists of a serving from each food group.   Stress Management:  -Group instruction provided by verbal instruction, video, and written materials to support subject matter.  Instructors review role of stress in heart disease and how to cope with stress  positively.     CARDIAC REHAB PHASE II EXERCISE from 05/22/2017 in [place]  Date  04/17/17  Instruction Review Code  2- meets goals/outcomes      Exercising on Your Own:  -Group instruction provided by verbal instruction, power point, and written materials to support subject.  Instructors discuss benefits of exercise, components of exercise, frequency and intensity of exercise, and end points for exercise.  Also discuss use of nitroglycerin and activating EMS.  Review options of places to exercise outside of rehab.  Review guidelines for sex with heart disease.   CARDIAC REHAB PHASE II EXERCISE from 05/22/2017  in [place]  Date  04/24/17  Educator  EP  Instruction Review Code  2- meets goals/outcomes      Cardiac Drugs I:  -Group instruction provided by verbal instruction and written materials to support subject.  Instructor reviews cardiac drug classes: antiplatelets, anticoagulants, beta blockers, and statins.  Instructor discusses reasons, side effects, and lifestyle considerations for each drug class.   CARDIAC REHAB PHASE II EXERCISE from 05/22/2017 in [place]  Date  05/01/17  Educator  pharmacist  Instruction Review Code  R- Review/reinforce      Cardiac Drugs II:  -Group instruction provided by verbal instruction and written materials to support subject.  Instructor reviews cardiac drug classes: angiotensin converting enzyme inhibitors (ACE-I), angiotensin II receptor blockers (ARBs), nitrates, and calcium channel blockers.  Instructor discusses reasons, side effects, and lifestyle considerations for each drug class.   Anatomy and Physiology of the Circulatory System:  Group verbal and written instruction and models provide basic cardiac anatomy and physiology, with the coronary electrical and arterial systems. Review of: AMI, Angina, Valve disease, Heart Failure, Peripheral Artery Disease, Cardiac Arrhythmia, Pacemakers, and the ICD.   CARDIAC REHAB PHASE II EXERCISE from 05/22/2017 in [place]  Date  05/15/17  Instruction Review Code  2- meets goals/outcomes      Other Education:  -Group or individual verbal, written, or video instructions that support the educational goals of the cardiac rehab program.   Knowledge Questionnaire Score:     Knowledge Questionnaire Score - 03/19/17 1035      Knowledge Questionnaire Score   Pre Score 23/24      Core Components/Risk Factors/Patient Goals at Admission:     Personal Goals and Risk Factors at Admission - 03/19/17 1228       Core Components/Risk Factors/Patient Goals on Admission   Hypertension Yes   Intervention Provide education on lifestyle modifcations including regular physical activity/exercise, weight management, moderate sodium restriction and increased consumption of fresh fruit, vegetables, and low fat dairy, alcohol moderation, and smoking cessation.;Monitor prescription use compliance.   Lipids Yes   Intervention Provide education and support for participant on nutrition & aerobic/resistive exercise along with prescribed medications to achieve LDL 70mg , HDL >40mg .   Expected Outcomes Short Term: Participant states understanding of desired cholesterol values and is compliant with medications prescribed. Participant is following exercise prescription and nutrition guidelines.;Long Term: Cholesterol controlled with medications as prescribed, with individualized exercise RX and with personalized nutrition plan. Value goals: LDL < 70mg , HDL > 40 mg.      Core Components/Risk Factors/Patient Goals Review:      Goals and Risk Factor Review    Row Name 04/30/17 1407 05/28/17 1734           Core Components/Risk Factors/Patient Goals Review   Personal Goals  Review Hypertension;Lipids Hypertension;Lipids      Review Stancil blood pressures have been stable at cardiac rehab Milbert Coulter blood pressures have been stable at cardiac rehab      Expected Outcomes Patient will continue to come to exercise at cardiac rehab. Will continue to monitor blood pressures at cardiac rehab. Patient will continue to come to exercise at cardiac rehab. Will continue to monitor blood pressures at cardiac rehab.         Core Components/Risk Factors/Patient Goals at Discharge (Final Review):      Goals and Risk Factor Review - 05/28/17 1734      Core Components/Risk Factors/Patient Goals Review   Personal Goals Review Hypertension;Lipids   Review Abhiram blood pressures have been stable at cardiac rehab   Expected Outcomes Patient will  continue to come to exercise at cardiac rehab. Will continue to monitor blood pressures at cardiac rehab.      ITP Comments:     ITP Comments    Row Name 03/19/17 0748           ITP Comments Medical Director- Dr. Fransico Him, MD          Comments: Macoy is making expected progress toward personal goals after completing 22 sessions. Recommend continued exercise and life style modification education including  stress management and relaxation techniques to decrease cardiac risk profile. Darriel is enjoying participating in phase 2 cardiac rehab.Barnet Pall, RN,BSN 05/28/2017 5:39 PM

## 2017-05-29 ENCOUNTER — Telehealth (HOSPITAL_COMMUNITY): Payer: Self-pay | Admitting: *Deleted

## 2017-05-29 ENCOUNTER — Encounter (HOSPITAL_COMMUNITY)
Admission: RE | Admit: 2017-05-29 | Discharge: 2017-05-29 | Disposition: A | Discharge status: Home / Self Care | Payer: Medicare Other | Source: Ambulatory Visit | Attending: Cardiovascular Disease | Admitting: Cardiovascular Disease

## 2017-05-29 DIAGNOSIS — Z9889 Other specified postprocedural states: Secondary | ICD-10-CM

## 2017-05-29 DIAGNOSIS — Z955 Presence of coronary angioplasty implant and graft: Secondary | ICD-10-CM

## 2017-05-31 ENCOUNTER — Encounter (HOSPITAL_COMMUNITY)
Admission: RE | Admit: 2017-05-31 | Discharge: 2017-05-31 | Disposition: A | Discharge status: Home / Self Care | Payer: Medicare Other | Source: Ambulatory Visit | Attending: Cardiovascular Disease | Admitting: Cardiovascular Disease

## 2017-05-31 DIAGNOSIS — Z9889 Other specified postprocedural states: Secondary | ICD-10-CM

## 2017-05-31 DIAGNOSIS — Z955 Presence of coronary angioplasty implant and graft: Secondary | ICD-10-CM

## 2017-05-31 DIAGNOSIS — Z48812 Encounter for surgical aftercare following surgery on the circulatory system: Secondary | ICD-10-CM | POA: Diagnosis not present

## 2017-06-03 ENCOUNTER — Encounter (HOSPITAL_COMMUNITY)
Admission: RE | Admit: 2017-06-03 | Discharge: 2017-06-03 | Disposition: A | Discharge status: Home / Self Care | Payer: Medicare Other | Source: Ambulatory Visit | Attending: Cardiovascular Disease | Admitting: Cardiovascular Disease

## 2017-06-03 DIAGNOSIS — Z48812 Encounter for surgical aftercare following surgery on the circulatory system: Secondary | ICD-10-CM | POA: Diagnosis not present

## 2017-06-03 DIAGNOSIS — Z9889 Other specified postprocedural states: Secondary | ICD-10-CM

## 2017-06-03 DIAGNOSIS — Z955 Presence of coronary angioplasty implant and graft: Secondary | ICD-10-CM

## 2017-06-05 ENCOUNTER — Encounter (HOSPITAL_COMMUNITY)
Admission: RE | Admit: 2017-06-05 | Discharge: 2017-06-05 | Disposition: A | Discharge status: Home / Self Care | Payer: Medicare Other | Source: Ambulatory Visit | Attending: Cardiovascular Disease | Admitting: Cardiovascular Disease

## 2017-06-05 DIAGNOSIS — Z955 Presence of coronary angioplasty implant and graft: Secondary | ICD-10-CM

## 2017-06-05 DIAGNOSIS — Z48812 Encounter for surgical aftercare following surgery on the circulatory system: Secondary | ICD-10-CM | POA: Diagnosis not present

## 2017-06-05 DIAGNOSIS — Z9889 Other specified postprocedural states: Secondary | ICD-10-CM

## 2017-06-07 ENCOUNTER — Encounter (HOSPITAL_COMMUNITY)
Admission: RE | Admit: 2017-06-07 | Discharge: 2017-06-07 | Disposition: A | Discharge status: Home / Self Care | Payer: Medicare Other | Source: Ambulatory Visit | Attending: Cardiovascular Disease | Admitting: Cardiovascular Disease

## 2017-06-07 DIAGNOSIS — Z955 Presence of coronary angioplasty implant and graft: Secondary | ICD-10-CM

## 2017-06-07 DIAGNOSIS — Z48812 Encounter for surgical aftercare following surgery on the circulatory system: Secondary | ICD-10-CM | POA: Diagnosis not present

## 2017-06-07 DIAGNOSIS — Z9889 Other specified postprocedural states: Secondary | ICD-10-CM

## 2017-06-10 ENCOUNTER — Encounter (HOSPITAL_COMMUNITY)
Admission: RE | Admit: 2017-06-10 | Discharge: 2017-06-10 | Disposition: A | Discharge status: Home / Self Care | Payer: Medicare Other | Source: Ambulatory Visit | Attending: Cardiovascular Disease | Admitting: Cardiovascular Disease

## 2017-06-10 DIAGNOSIS — Z9889 Other specified postprocedural states: Secondary | ICD-10-CM

## 2017-06-10 DIAGNOSIS — Z955 Presence of coronary angioplasty implant and graft: Secondary | ICD-10-CM

## 2017-06-10 DIAGNOSIS — Z48812 Encounter for surgical aftercare following surgery on the circulatory system: Secondary | ICD-10-CM | POA: Diagnosis not present

## 2017-06-12 ENCOUNTER — Encounter (HOSPITAL_COMMUNITY): Payer: Medicare Other

## 2017-06-14 ENCOUNTER — Encounter (HOSPITAL_COMMUNITY)
Admission: RE | Admit: 2017-06-14 | Discharge: 2017-06-14 | Disposition: A | Discharge status: Home / Self Care | Payer: Medicare Other | Source: Ambulatory Visit | Attending: Cardiovascular Disease | Admitting: Cardiovascular Disease

## 2017-06-14 DIAGNOSIS — Z955 Presence of coronary angioplasty implant and graft: Secondary | ICD-10-CM

## 2017-06-14 DIAGNOSIS — Z48812 Encounter for surgical aftercare following surgery on the circulatory system: Secondary | ICD-10-CM | POA: Diagnosis not present

## 2017-06-14 DIAGNOSIS — Z9889 Other specified postprocedural states: Secondary | ICD-10-CM

## 2017-06-17 ENCOUNTER — Encounter (HOSPITAL_COMMUNITY)
Admission: RE | Admit: 2017-06-17 | Discharge: 2017-06-17 | Disposition: A | Discharge status: Home / Self Care | Payer: Medicare Other | Source: Ambulatory Visit | Attending: Cardiovascular Disease | Admitting: Cardiovascular Disease

## 2017-06-17 DIAGNOSIS — Z48812 Encounter for surgical aftercare following surgery on the circulatory system: Secondary | ICD-10-CM | POA: Diagnosis not present

## 2017-06-17 DIAGNOSIS — Z9889 Other specified postprocedural states: Secondary | ICD-10-CM

## 2017-06-17 DIAGNOSIS — Z955 Presence of coronary angioplasty implant and graft: Secondary | ICD-10-CM

## 2017-06-19 ENCOUNTER — Encounter (HOSPITAL_COMMUNITY)
Admission: RE | Admit: 2017-06-19 | Discharge: 2017-06-19 | Disposition: A | Discharge status: Home / Self Care | Payer: Medicare Other | Source: Ambulatory Visit | Attending: Cardiovascular Disease | Admitting: Cardiovascular Disease

## 2017-06-19 DIAGNOSIS — Z48812 Encounter for surgical aftercare following surgery on the circulatory system: Secondary | ICD-10-CM | POA: Diagnosis not present

## 2017-06-19 DIAGNOSIS — Z955 Presence of coronary angioplasty implant and graft: Secondary | ICD-10-CM

## 2017-06-19 DIAGNOSIS — Z9889 Other specified postprocedural states: Secondary | ICD-10-CM

## 2017-06-20 NOTE — Progress Notes (Signed)
Cardiac Individual Treatment Plan  Patient Details  Name: [name] MRN: [id number] Date of Birth: [birth year] Referring Provider:     CARDIAC REHAB PHASE II ORIENTATION from 03/19/2017 in [facility]  Referring Provider  Jenkins Rouge MD      Initial Encounter Date:    CARDIAC REHAB PHASE II ORIENTATION from 03/19/2017 in [facility]  Date  03/19/17  Referring Provider  Jenkins Rouge MD      Visit Diagnosis: 02/27/17 atherectomy  02/27/17 DES x2  Patient's Home Medications on Admission:  Current Outpatient Prescriptions:  .  acetaminophen (TYLENOL) 500 MG tablet, Take 1 tablet (500 mg total) by mouth every 6 (six) hours as needed for moderate pain (wrist)., Disp: 30 tablet, Rfl: 0 .  amLODipine (NORVASC) 10 MG tablet, Take 1 tablet (10 mg total) by mouth daily., Disp: 90 tablet, Rfl: 3 .  aspirin EC 81 MG EC tablet, Take 1 tablet (81 mg total) by mouth daily., Disp: , Rfl:  .  b complex vitamins tablet, Take 1 tablet by mouth daily., Disp: , Rfl:  .  Calcium Carbonate (CALCIUM 500 PO), Take 1 tablet by mouth daily., Disp: , Rfl:  .  Cholecalciferol (VITAMIN D-3) 1000 units CAPS, Take 1 capsule by mouth daily., Disp: , Rfl:  .  clopidogrel (PLAVIX) 75 MG tablet, Take 1 tablet (75 mg total) by mouth daily with breakfast., Disp: 90 tablet, Rfl: 2 .  Glucosamine 500 MG CAPS, Take 1 capsule by mouth daily. , Disp: , Rfl:  .  isosorbide mononitrate (IMDUR) 60 MG 24 hr tablet, Take 1.5 tablets (90 mg total) by mouth daily., Disp: 135 tablet, Rfl: 2 .  lisinopril (PRINIVIL,ZESTRIL) 40 MG tablet, Take 40 mg by mouth daily., Disp: , Rfl:  .  Multiple Vitamin (MULTIVITAMIN) capsule, Take 1 capsule by mouth daily., Disp: , Rfl:  .  nitroGLYCERIN (NITROSTAT) 0.4 MG SL tablet, place 1 tablet under the tongue if needed every 5 minutes fo, Disp: 25 tablet, Rfl: 3 .  Omega-3 Fatty Acids (FISH OIL) 1000 MG CAPS, Take 1 capsule by mouth  daily., Disp: , Rfl:  .  OVER THE COUNTER MEDICATION, 600 mg as needed., Disp: , Rfl:  .  ranolazine (RANEXA) 1000 MG SR tablet, Take 1 tablet (1,000 mg total) by mouth 2 (two) times daily., Disp: 180 tablet, Rfl: 2 .  simvastatin (ZOCOR) 20 MG tablet, Take 1 tablet (20 mg total) by mouth daily., Disp: 90 tablet, Rfl: 3 .  vitamin C (ASCORBIC ACID) 500 MG tablet, Take 500 mg by mouth daily.  , Disp: , Rfl:   Past Medical History: Past Medical History:  Diagnosis Date  . AV BLOCK, 1ST DEGREE   . Carotid artery disease ([place])    a. s/p Left ECA followed by Dr. Donnetta Hutching  . Coronary artery disease   . Coronary artery disease involving coronary bypass graft of native heart with angina pectoris ([place])    a. LHC 12/2015  3vd Left Cx 100% with colaterals, and distally LAD occluded, DES to prox-mid RCA  b. 02/2017: repeat cath with patent RCA stent and stable CAD; 02/27/2017: Rota-PCI to [place]  . GERD (gastroesophageal reflux disease)   . HLD (hyperlipidemia)   . HTN (hypertension)   . NSVT (nonsustained ventricular tachycardia) ([place])    during stress test 12/2015  . Presence of drug coated stent in RCA & Ramus Intermedius. 02/28/2017   12/2015: PCI p-mRCA - Stent Resolute Integ 3.5x34 02/2017:  Rota-PCI o-mRamus - overlapping DES STENT SYNERGY DES 3X32  & 3x24 (tapered post-dilation)  . Stroke [facility])     Tobacco Use: History  Smoking Status  . Never Smoker  Smokeless Tobacco  . Never Used    Labs: Recent Review Flowsheet Data    Labs for ITP Cardiac and Pulmonary Rehab Latest Ref Rng & Units 05/24/2010 01/11/2016 01/12/2016 02/21/2017   Cholestrol 0 - 200 mg/dL 139 - 120 109   LDLCALC 0 - 99 mg/dL 70 - 48 53   HDL >40 mg/dL 51.30 - 52 46   Trlycerides <150 mg/dL 87.0 - 98 52   Hemoglobin A1c 4.8 - 5.6 % - 6.0(H) - -      Capillary Blood Glucose: No results found for: GLUCAP   Exercise Target Goals:    Exercise Program Goal: Individual exercise prescription set with THRR, safety & activity  barriers. Participant demonstrates ability to understand and report RPE using BORG scale, to self-measure pulse accurately, and to acknowledge the importance of the exercise prescription.  Exercise Prescription Goal: Starting with aerobic activity 30 plus minutes a day, 3 days per week for initial exercise prescription. Provide home exercise prescription and guidelines that participant acknowledges understanding prior to discharge.  Activity Barriers & Risk Stratification:     Activity Barriers & Cardiac Risk Stratification - 03/19/17 0819      Activity Barriers & Cardiac Risk Stratification   Activity Barriers Arthritis;Deconditioning;Muscular Weakness;Balance Concerns   Cardiac Risk Stratification High      6 Minute Walk:     6 Minute Walk    Row Name 03/19/17 1038 06/19/17 1510       6 Minute Walk   Phase Initial Discharge    Distance 12173 feet 1445 feet    Distance % Change  - 13.51 %    Walk Time 6 minutes 6 minutes    # of Rest Breaks 0 0    MPH 2.42 2.74    METS 1.66 2.27    RPE 13 14    VO2 Peak 5.8 7.96    Symptoms Yes (comment) No    Comments pt c/o "woozy/dizzy" with walk test. Pt did not eat breakfast, given light snack (banana/PB crackers, ginger ale) symptoms resolved.  -    Resting HR 59 bpm 80 bpm    Resting BP 118/60 122/62    Max Ex. HR 72 bpm 92 bpm    Max Ex. BP 124/72 148/52    2 Minute Post BP 112/52  -       Oxygen Initial Assessment:   Oxygen Re-Evaluation:   Oxygen Discharge (Final Oxygen Re-Evaluation):   Initial Exercise Prescription:     Initial Exercise Prescription - 03/19/17 1000      Date of Initial Exercise RX and Referring Provider   Date 03/19/17   Referring Provider Jenkins Rouge MD     Treadmill   MPH 2   Grade 0   Minutes 10   METs 2.53     Recumbant Bike   Level 2   Minutes 10   METs 2     NuStep   Level 2   SPM 70   Minutes 10   METs 2     Prescription Details   Frequency (times per week) 3    Duration Progress to 30 minutes of continuous aerobic without signs/symptoms of physical distress     Intensity   THRR 40-80% of Max Heartrate 53-106   Ratings of Perceived Exertion 11-13  Perceived Dyspnea 0-4     Progression   Progression Continue to progress workloads to maintain intensity without signs/symptoms of physical distress.     Resistance Training   Training Prescription Yes   Weight 2lbs   Reps 10-15      Perform Capillary Blood Glucose checks as needed.  Exercise Prescription Changes:      Exercise Prescription Changes    Row Name 04/12/17 1700 04/26/17 1700 05/07/17 1600 05/20/17 1700 06/03/17 1445     Response to Exercise   Blood Pressure (Admit) 120/60 122/68 101/61 102/78 100/68   Blood Pressure (Exercise) 110/58 128/60 104/59 142/78 126/60   Blood Pressure (Exit) 122/60 118/62 118/70 104/72 104/50   Heart Rate (Admit) 77 bpm 63 bpm 92 bpm 67 bpm 59 bpm   Heart Rate (Exercise) 106 bpm 77 bpm 97 bpm 91 bpm 81 bpm   Heart Rate (Exit) 76 bpm 62 bpm 96 bpm 68 bpm 58 bpm   Rating of Perceived Exertion (Exercise) 13 14 14 15 14    Symptoms none none none none none   Duration Progress to 30 minutes of  aerobic without signs/symptoms of physical distress Progress to 30 minutes of  aerobic without signs/symptoms of physical distress Progress to 30 minutes of  aerobic without signs/symptoms of physical distress Progress to 30 minutes of  aerobic without signs/symptoms of physical distress Progress to 30 minutes of  aerobic without signs/symptoms of physical distress   Intensity THRR unchanged THRR unchanged THRR unchanged THRR unchanged THRR unchanged     Progression   Progression Continue to progress workloads to maintain intensity without signs/symptoms of physical distress. Continue to progress workloads to maintain intensity without signs/symptoms of physical distress. Continue to progress workloads to maintain intensity without signs/symptoms of physical distress.  Continue to progress workloads to maintain intensity without signs/symptoms of physical distress. Continue to progress workloads to maintain intensity without signs/symptoms of physical distress.   Average METs 2.2 2.2 2.2 2.4 2.5     Resistance Training   Training Prescription Yes Yes Yes Yes Yes   Weight 2lbs 2lbs 2lbs 3lbs 3lbs   Reps 10-15 10-15 10-15 10-15 10-15   Time  -  -  - 10 Minutes 10 Minutes     Interval Training   Interval Training No No No No No     Treadmill   MPH -  -  -  -  -   Grade -  -  -  -  -   Minutes -  -  -  -  -   METs -  -  -  -  -     Recumbant Bike   Level 2 2 3 4 3    Minutes 10 10 10 10 10    METs 2 2 2.1 2.3 2.2     NuStep   Level 2 3 3 3 3    SPM 70 70 70 70 70   Minutes 10 10 10 10 10    METs 1.9 2 1.8 2.3 2.3     Track   Laps 9 11 9 9 12    Minutes 10 10 10 10 10    METs 2.57 2.74 2.57 2.57 3.09     Home Exercise Plan   Plans to continue exercise at  - Home (comment) Home (comment) Home (comment) Home (comment)   Frequency  - Add 1 additional day to program exercise sessions. Add 1 additional day to program exercise sessions. Add 1 additional day to program exercise sessions. Add 1  additional day to program exercise sessions.   Initial Home Exercises Provided  - 04/24/17 04/24/17 04/24/17 04/24/17   Row Name 06/17/17 1600             Response to Exercise   Blood Pressure (Admit) 112/80       Blood Pressure (Exercise) 142/80       Blood Pressure (Exit) 114/60       Heart Rate (Admit) 84 bpm       Heart Rate (Exercise) 82 bpm       Heart Rate (Exit) 80 bpm       Rating of Perceived Exertion (Exercise) 14       Symptoms none       Duration Progress to 30 minutes of  aerobic without signs/symptoms of physical distress       Intensity THRR unchanged         Progression   Progression Continue to progress workloads to maintain intensity without signs/symptoms of physical distress.       Average METs 2.5         Resistance Training    Training Prescription Yes       Weight 2lbs       Reps 10-15       Time 10 Minutes         Interval Training   Interval Training No         Recumbant Bike   Level 2.5       Minutes 10       METs 2.2         NuStep   Level 4       SPM 70       Minutes 10       METs 2.5         Track   Laps 11       Minutes 10       METs 2.92         Home Exercise Plan   Plans to continue exercise at Home (comment)       Frequency Add 1 additional day to program exercise sessions.       Initial Home Exercises Provided 04/24/17          Exercise Comments:      Exercise Comments    Row Name 03/29/17 1445 04/24/17 1821 05/08/17 1742 05/22/17 1445 06/05/17 1530   Exercise Comments Pt is doing well and off to a great start with exercise! Reviewed home exercise prescription and METs. Encouraged pt to walk 30 minutes, at least 1 other day in addition to CR, and pt is agreeable to this. Reviewed METs with patient. Reviewed METs and goals with patient. Reviewed METs with patient.   [place] Name 06/19/17 1500           Exercise Comments Reviewed METs and goals with patient.          Exercise Goals and Review:      Exercise Goals    Row Name 03/19/17 [id number]             Exercise Goals   Increase Physical Activity Yes       Intervention Provide advice, education, support and counseling about physical activity/exercise needs.;Develop an individualized exercise prescription for aerobic and resistive training based on initial evaluation findings, risk stratification, comorbidities and participant's personal goals.       Expected Outcomes Achievement of increased cardiorespiratory fitness and enhanced flexibility, muscular endurance and strength shown through measurements of  functional capacity and personal statement of participant.  improve walking tolerance       Increase Strength and Stamina Yes       Intervention Provide advice, education, support and counseling about physical  activity/exercise needs.;Develop an individualized exercise prescription for aerobic and resistive training based on initial evaluation findings, risk stratification, comorbidities and participant's personal goals.       Expected Outcomes Achievement of increased cardiorespiratory fitness and enhanced flexibility, muscular endurance and strength shown through measurements of functional capacity and personal statement of participant.          Exercise Goals Re-Evaluation :     Exercise Goals Re-Evaluation    Row Name 04/26/17 1744 05/22/17 1445 06/19/17 1514         Exercise Goal Re-Evaluation   Exercise Goals Review Increase Physical Activity;Increase Strength and Stamina Increase Physical Activity;Increase Strength and Stamina Increase Physical Activity;Increase Strength and Stamina     Comments Pt feels that his walking tolerance is better but his stength and stamina is about the same. Encouraged to walk at least 1 day in addition to CR. Pt states that he feels good with his exercise at cardiac rehab. Pt states he's walking a little with his dog and plans to increase his walking at home. Patient plans to continue walking 1/2 mile twice/day for his home exercise.     Expected Outcomes Begin walking at least 1 day at home. Increase workloads at CR as tolerated. Walk 30 minutes 1-2 days/week at home. Accumulate 30 minutes of walking to achieve health and fitness goals.         Discharge Exercise Prescription (Final Exercise Prescription Changes):     Exercise Prescription Changes - 06/17/17 1600      Response to Exercise   Blood Pressure (Admit) 112/80   Blood Pressure (Exercise) 142/80   Blood Pressure (Exit) 114/60   Heart Rate (Admit) 84 bpm   Heart Rate (Exercise) 82 bpm   Heart Rate (Exit) 80 bpm   Rating of Perceived Exertion (Exercise) 14   Symptoms none   Duration Progress to 30 minutes of  aerobic without signs/symptoms of physical distress   Intensity THRR unchanged      Progression   Progression Continue to progress workloads to maintain intensity without signs/symptoms of physical distress.   Average METs 2.5     Resistance Training   Training Prescription Yes   Weight 2lbs   Reps 10-15   Time 10 Minutes     Interval Training   Interval Training No     Recumbant Bike   Level 2.5   Minutes 10   METs 2.2     NuStep   Level 4   SPM 70   Minutes 10   METs 2.5     Track   Laps 11   Minutes 10   METs 2.92     Home Exercise Plan   Plans to continue exercise at Home (comment)   Frequency Add 1 additional day to program exercise sessions.   Initial Home Exercises Provided 04/24/17      Nutrition:  Target Goals: Understanding of nutrition guidelines, daily intake of sodium 1500mg , cholesterol 200mg , calories 30% from fat and 7% or less from saturated fats, daily to have 5 or more servings of fruits and vegetables.  Biometrics:     Pre Biometrics - 03/19/17 1232      Pre Biometrics   Height 5' 7.5" (1.715 m)   Weight 174 lb 6.1 oz (79.1 kg)  Waist Circumference 41 inches   Hip Circumference 40 inches   Waist to Hip Ratio 1.02 %   BMI (Calculated) 27   Triceps Skinfold 18 mm   % Body Fat 29.2 %   Grip Strength 23 kg   Flexibility 9.75 in   Single Leg Stand 1.2 seconds         Post Biometrics - 06/19/17 1510       Post  Biometrics   Waist Circumference 39.5 inches   Hip Circumference 40 inches   Waist to Hip Ratio 0.99 %   Triceps Skinfold 15 mm   % Body Fat 24.6 %   Grip Strength 24.5 kg   Flexibility 13.5 in   Single Leg Stand 7.21 seconds      Nutrition Therapy Plan and Nutrition Goals:     Nutrition Therapy & Goals - 03/19/17 1222      Nutrition Therapy   Diet Therapeutic Lifestyle Changes     Personal Nutrition Goals   Nutrition Goal Pt to identify food quantities necessary to achieve weight loss of 6-10 lb at graduation from cardiac rehab. Goal wt of 160 lb desired.      Intervention Plan    Intervention Prescribe, educate and counsel regarding individualized specific dietary modifications aiming towards targeted core components such as weight, hypertension, lipid management, diabetes, heart failure and other comorbidities.   Expected Outcomes Short Term Goal: Understand basic principles of dietary content, such as calories, fat, sodium, cholesterol and nutrients.;Long Term Goal: Adherence to prescribed nutrition plan.      Nutrition Discharge: Nutrition Scores:     Nutrition Assessments - 03/19/17 1123      MEDFICTS Scores   Pre Score 72      Nutrition Goals Re-Evaluation:   Nutrition Goals Re-Evaluation:   Nutrition Goals Discharge (Final Nutrition Goals Re-Evaluation):   Psychosocial: Target Goals: Acknowledge presence or absence of significant depression and/or stress, maximize coping skills, provide positive support system. Participant is able to verbalize types and ability to use techniques and skills needed for reducing stress and depression.  Initial Review & Psychosocial Screening:     Initial Psych Review & Screening - 03/19/17 1230      Initial Review   Current issues with None Identified     Family Dynamics   Good Support System? Yes     Barriers   Psychosocial barriers to participate in program There are no identifiable barriers or psychosocial needs.     Screening Interventions   Interventions Encouraged to exercise      Quality of Life Scores:     Quality of Life - 03/19/17 1035      Quality of Life Scores   Health/Function Pre 27.83 %   Socioeconomic Pre 28.21 %   Psych/Spiritual Pre 28.36 %   Family Pre 27.3 %   GLOBAL Pre 27.94 %      PHQ-9: Recent Review Flowsheet Data    Depression screen [facility] 2/9 04/17/2017 04/04/2017 03/29/2017 03/28/2017 02/21/2017   Decreased Interest 0 0 0 0 0   Down, Depressed, Hopeless 0 0 0 0 0   PHQ - 2 Score 0 0 0 0 0     Interpretation of Total Score  Total Score Depression Severity:  1-4 =  Minimal depression, 5-9 = Mild depression, 10-14 = Moderate depression, 15-19 = Moderately severe depression, 20-27 = Severe depression   Psychosocial Evaluation and Intervention:   Psychosocial Re-Evaluation:     Psychosocial Re-Evaluation    [place] Name 04/04/17 1215  04/30/17 1400 05/28/17 1734 06/20/17 1608       Psychosocial Re-Evaluation   Current issues with None Identified None Identified None Identified None Identified    Interventions Encouraged to attend Cardiac Rehabilitation for the exercise Encouraged to attend Cardiac Rehabilitation for the exercise Encouraged to attend Cardiac Rehabilitation for the exercise Encouraged to attend Cardiac Rehabilitation for the exercise    Continue Psychosocial Services  No Follow up required No Follow up required No Follow up required No Follow up required       Psychosocial Discharge (Final Psychosocial Re-Evaluation):     Psychosocial Re-Evaluation - 06/20/17 1608      Psychosocial Re-Evaluation   Current issues with None Identified   Interventions Encouraged to attend Cardiac Rehabilitation for the exercise   Continue Psychosocial Services  No Follow up required      Vocational Rehabilitation: Provide vocational rehab assistance to qualifying candidates.   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 Participant will state understanding/return demonstration of topics presented.  Learning Barriers/Preferences:     Learning Barriers/Preferences - 03/19/17 0818      Learning Barriers/Preferences   Learning Barriers Sight   Learning Preferences Written Material      Education Topics: Count Your Pulse:  -Group instruction provided by  verbal instruction, demonstration, patient participation and written materials to support subject.  Instructors address importance of being able to find your pulse and how to count your pulse when at home without a heart monitor.  Patients get hands on experience counting their pulse with staff help and individually.   CARDIAC REHAB PHASE II EXERCISE from 06/21/2017 in [place]  Date  06/21/17  Instruction Review Code  2- meets goals/outcomes      Heart Attack, Angina, and Risk Factor Modification:  -Group instruction provided by verbal instruction, video, and written materials to support subject.  Instructors address signs and symptoms of angina and heart attacks.    Also discuss risk factors for heart disease and how to make changes to improve heart health risk factors.   CARDIAC REHAB PHASE II EXERCISE from 06/21/2017 in [place]  Date  05/08/17  Instruction Review Code  2- meets goals/outcomes      Functional Fitness:  -Group instruction provided by verbal instruction, demonstration, patient participation, and written materials to support subject.  Instructors address safety measures for doing things around the house.  Discuss how to get up and down off the floor, how to pick things up properly, how to safely get out of a chair without assistance, and balance training.   CARDIAC REHAB PHASE II EXERCISE from 06/21/2017 in [place]  Date  06/07/17  Educator  EP  Instruction Review Code  R- Review/reinforce      Meditation and Mindfulness:  -Group instruction provided by verbal instruction, patient participation, and written materials to support subject.  Instructor addresses importance of mindfulness and meditation practice to help reduce stress and improve awareness.  Instructor also leads participants through a meditation exercise.    CARDIAC REHAB PHASE II EXERCISE from 06/21/2017 in [place]  Date  05/22/17  Instruction  Review Code  2- meets goals/outcomes      Stretching for Flexibility and Mobility:  -Group instruction provided by verbal instruction, patient participation, and written materials to support subject.  Instructors lead participants through series of stretches that are designed to increase flexibility thus improving mobility.  These stretches are additional exercise for major muscle groups that are typically performed during regular warm up and cool down.   Hands Only CPR:  -Group verbal, video, and participation provides a basic overview of AHA guidelines for community CPR. Role-play of emergencies allow participants the opportunity to practice calling for help and chest compression technique with discussion of AED use.   Hypertension: -Group verbal and written instruction that provides a basic overview of hypertension including the most recent diagnostic guidelines, risk factor reduction with self-care instructions and medication management.   CARDIAC REHAB PHASE II EXERCISE from 06/21/2017 in [place]  Date  05/17/17  Instruction Review Code  2- meets goals/outcomes       Nutrition I class: Heart Healthy Eating:  -Group instruction provided by PowerPoint slides, verbal discussion, and written materials to support subject matter. The instructor gives an explanation and review of the Therapeutic Lifestyle Changes diet recommendations, which includes a discussion on lipid goals, dietary fat, sodium, fiber, plant stanol/sterol esters, sugar, and the components of a well-balanced, healthy diet.   CARDIAC REHAB PHASE II EXERCISE from 06/21/2017 in [place]  Date  03/19/17  Educator  RD  Instruction Review Code  Not applicable      Nutrition II class: Lifestyle Skills:  -Group instruction provided by PowerPoint slides, verbal discussion, and written materials  to support subject matter. The instructor gives an explanation and review of label reading, grocery shopping for heart health, heart healthy recipe modifications, and ways to make healthier choices when eating out.   CARDIAC REHAB PHASE II EXERCISE from 06/21/2017 in [place]  Date  03/19/17  Educator  RD  Instruction Review Code  Not applicable      Diabetes Question & Answer:  -Group instruction provided by PowerPoint slides, verbal discussion, and written materials to support subject matter. The instructor gives an explanation and review of diabetes co-morbidities, pre- and post-prandial blood glucose goals, pre-exercise blood glucose goals, signs, symptoms, and treatment of hypoglycemia and hyperglycemia, and foot care basics.   Diabetes Blitz:  -Group instruction provided by PowerPoint slides, verbal discussion, and written materials to support subject matter. The instructor gives an explanation and review of the physiology behind type 1 and type 2 diabetes, diabetes medications and rational behind using different medications, pre- and post-prandial blood glucose recommendations and Hemoglobin A1c goals, diabetes diet, and exercise including blood glucose guidelines for exercising safely.    Portion Distortion:  -Group instruction provided by PowerPoint slides, verbal discussion, written materials, and food models to support subject matter. The instructor gives an explanation of serving size versus portion size, changes in portions sizes over the last 20 years, and what consists of a serving from each food group.   Stress Management:  -Group instruction provided by verbal instruction, video, and written materials to support subject matter.  Instructors review role of stress in heart disease and how to cope with stress positively.     CARDIAC REHAB PHASE II EXERCISE from 06/21/2017 in [place]  Date  06/14/17  Instruction  Review Code  2- meets goals/outcomes      Exercising on  Your Own:  -Group instruction provided by verbal instruction, power point, and written materials to support subject.  Instructors discuss benefits of exercise, components of exercise, frequency and intensity of exercise, and end points for exercise.  Also discuss use of nitroglycerin and activating EMS.  Review options of places to exercise outside of rehab.  Review guidelines for sex with heart disease.   CARDIAC REHAB PHASE II EXERCISE from 06/21/2017 in [place]  Date  04/24/17  Educator  EP  Instruction Review Code  2- meets goals/outcomes      Cardiac Drugs I:  -Group instruction provided by verbal instruction and written materials to support subject.  Instructor reviews cardiac drug classes: antiplatelets, anticoagulants, beta blockers, and statins.  Instructor discusses reasons, side effects, and lifestyle considerations for each drug class.   CARDIAC REHAB PHASE II EXERCISE from 06/21/2017 in [place]  Date  05/01/17  Educator  pharmacist  Instruction Review Code  R- Review/reinforce      Cardiac Drugs II:  -Group instruction provided by verbal instruction and written materials to support subject.  Instructor reviews cardiac drug classes: angiotensin converting enzyme inhibitors (ACE-I), angiotensin II receptor blockers (ARBs), nitrates, and calcium channel blockers.  Instructor discusses reasons, side effects, and lifestyle considerations for each drug class.   Anatomy and Physiology of the Circulatory System:  Group verbal and written instruction and models provide basic cardiac anatomy and physiology, with the coronary electrical and arterial systems. Review of: AMI, Angina, Valve disease, Heart Failure, Peripheral Artery Disease, Cardiac Arrhythmia, Pacemakers, and the ICD.   CARDIAC REHAB PHASE II EXERCISE from 06/21/2017 in [place]  Date  05/15/17  Instruction Review Code  2- meets goals/outcomes      Other Education:  -Group or individual verbal, written, or video instructions that support the educational goals of the cardiac rehab program.   Knowledge Questionnaire Score:     Knowledge Questionnaire Score - 03/19/17 1035      Knowledge Questionnaire Score   Pre Score 23/24      Core Components/Risk Factors/Patient Goals at Admission:     Personal Goals and Risk Factors at Admission - 03/19/17 1228      Core Components/Risk Factors/Patient Goals on Admission   Hypertension Yes   Intervention Provide education on lifestyle modifcations including regular physical activity/exercise, weight management, moderate sodium restriction and increased consumption of fresh fruit, vegetables, and low fat dairy, alcohol moderation, and smoking cessation.;Monitor prescription use compliance.   Lipids Yes   Intervention Provide education and support for participant on nutrition & aerobic/resistive exercise along with prescribed medications to achieve LDL 70mg , HDL >40mg .   Expected Outcomes Short Term: Participant states understanding of desired cholesterol values and is compliant with medications prescribed. Participant is following exercise prescription and nutrition guidelines.;Long Term: Cholesterol controlled with medications as prescribed, with individualized exercise RX and with personalized nutrition plan. Value goals: LDL < 70mg , HDL > 40 mg.      Core Components/Risk Factors/Patient Goals Review:      Goals and Risk Factor Review    Row Name 04/30/17 1407 05/28/17 1734 06/20/17 1607         Core Components/Risk Factors/Patient Goals Review   Personal Goals Review Hypertension;Lipids Hypertension;Lipids Hypertension;Lipids     Review Dragon blood pressures have been stable at cardiac rehab Jhony blood pressures have been stable at cardiac rehab [place] blood pressures have been stable at cardiac  rehab  Expected Outcomes Patient will continue to come to exercise at cardiac rehab. Will continue to monitor blood pressures at cardiac rehab. Patient will continue to come to exercise at cardiac rehab. Will continue to monitor blood pressures at cardiac rehab. Patient will continue to come to exercise at cardiac rehab. Will continue to monitor blood pressures at cardiac rehab.        Core Components/Risk Factors/Patient Goals at Discharge (Final Review):      Goals and Risk Factor Review - 06/20/17 1607      Core Components/Risk Factors/Patient Goals Review   Personal Goals Review Hypertension;Lipids   Review [place] blood pressures have been stable at cardiac rehab   Expected Outcomes Patient will continue to come to exercise at cardiac rehab. Will continue to monitor blood pressures at cardiac rehab.      ITP Comments:     ITP Comments    Row Name 03/19/17 0748           ITP Comments Medical Director- Dr. Fransico Him, MD          Comments: Askia is making expected progress toward personal goals after completing 30 sessions. Recommend continued exercise and life style modification education including  stress management and relaxation techniques to decrease cardiac risk profile. Shawnn continues to do well with exercise. Orell is enjoying participating in the program. Mamoru will be graduating on Monday.Barnet Pall, RN,BSN 06/21/2017 5:16 PM

## 2017-06-21 ENCOUNTER — Encounter (HOSPITAL_COMMUNITY)
Admission: RE | Admit: 2017-06-21 | Discharge: 2017-06-21 | Disposition: A | Discharge status: Home / Self Care | Payer: Medicare Other | Source: Ambulatory Visit | Attending: Cardiovascular Disease | Admitting: Cardiovascular Disease

## 2017-06-21 DIAGNOSIS — Z9889 Other specified postprocedural states: Secondary | ICD-10-CM

## 2017-06-21 DIAGNOSIS — Z955 Presence of coronary angioplasty implant and graft: Secondary | ICD-10-CM | POA: Insufficient documentation

## 2017-06-21 DIAGNOSIS — Z48812 Encounter for surgical aftercare following surgery on the circulatory system: Secondary | ICD-10-CM | POA: Insufficient documentation

## 2017-06-24 ENCOUNTER — Encounter (HOSPITAL_COMMUNITY)
Admission: RE | Admit: 2017-06-24 | Discharge: 2017-06-24 | Disposition: A | Discharge status: Home / Self Care | Payer: Medicare Other | Source: Ambulatory Visit | Attending: Cardiovascular Disease | Admitting: Cardiovascular Disease

## 2017-06-24 VITALS — BP 114/54 | HR 64 | Ht 67.5 in | Wt 170.9 lb

## 2017-06-24 DIAGNOSIS — Z955 Presence of coronary angioplasty implant and graft: Secondary | ICD-10-CM

## 2017-06-24 DIAGNOSIS — Z48812 Encounter for surgical aftercare following surgery on the circulatory system: Secondary | ICD-10-CM | POA: Diagnosis not present

## 2017-06-24 DIAGNOSIS — Z9889 Other specified postprocedural states: Secondary | ICD-10-CM

## 2017-06-24 NOTE — Progress Notes (Signed)
Discharge Progress Report  Patient Details  Name: [name] MRN: [id number] Date of Birth: 08-May-1928 Referring Provider:     CARDIAC REHAB PHASE II ORIENTATION from 03/19/2017 in [place]  Referring Provider  Jenkins Rouge MD       Number of Visits: 32  Reason for Discharge:  Patient independent in their exercise.  Smoking History:  Social History   Tobacco Use  Smoking Status Never Smoker  Smokeless Tobacco Never Used    Diagnosis:  02/27/17 atherectomy  02/27/17 DES x2  ADL UCSD:   Initial Exercise Prescription: Initial Exercise Prescription - 03/19/17 1000      Date of Initial Exercise RX and Referring Provider   Date  03/19/17    Referring Provider  Jenkins Rouge MD      Treadmill   MPH  2    Grade  0    Minutes  10    METs  2.53      Recumbant Bike   Level  2    Minutes  10    METs  2      NuStep   Level  2    SPM  70    Minutes  10    METs  2      Prescription Details   Frequency (times per week)  3    Duration  Progress to 30 minutes of continuous aerobic without signs/symptoms of physical distress      Intensity   THRR 40-80% of Max Heartrate  53-106    Ratings of Perceived Exertion  11-13    Perceived Dyspnea  0-4      Progression   Progression  Continue to progress workloads to maintain intensity without signs/symptoms of physical distress.      Resistance Training   Training Prescription  Yes    Weight  2lbs    Reps  10-15       Discharge Exercise Prescription (Final Exercise Prescription Changes): Exercise Prescription Changes - 06/24/17 1600      Response to Exercise   Blood Pressure (Admit)  114/54    Blood Pressure (Exercise)  122/80    Blood Pressure (Exit)  116/62    Heart Rate (Admit)  64 bpm    Heart Rate (Exercise)  75 bpm    Heart Rate (Exit)  62 bpm    Rating of Perceived Exertion (Exercise)  14    Symptoms  none    Duration  Progress to 30 minutes of  aerobic without  signs/symptoms of physical distress    Intensity  THRR unchanged      Progression   Progression  Continue to progress workloads to maintain intensity without signs/symptoms of physical distress.    Average METs  2.6      Resistance Training   Training Prescription  Yes    Weight  3lbs    Reps  10-15    Time  10 Minutes      Interval Training   Interval Training  No      Recumbant Bike   Level  2.5    Minutes  10    METs  2.3      NuStep   Level  4    SPM  70    Minutes  10    METs  2.8      Track   Laps  10    Minutes  10    METs  2.74  Home Exercise Plan   Plans to continue exercise at  Home (comment)    Frequency  Add 2 additional days to program exercise sessions.    Initial Home Exercises Provided  04/24/17       Functional Capacity: 6 Minute Walk    Row Name 03/19/17 1038 06/19/17 1510       6 Minute Walk   Phase  Initial  Discharge    Distance  12173 feet  1445 feet    Distance % Change  -  13.51 %    Walk Time  6 minutes  6 minutes    # of Rest Breaks  0  0    MPH  2.42  2.74    METS  1.66  2.27    RPE  13  14    VO2 Peak  5.8  7.96    Symptoms  Yes (comment)  No    Comments  pt c/o "woozy/dizzy" with walk test. Pt did not eat breakfast, given light snack (banana/PB crackers, ginger ale) symptoms resolved.  -    Resting HR  59 bpm  80 bpm    Resting BP  118/60  122/62    Max Ex. HR  72 bpm  92 bpm    Max Ex. BP  124/72  148/52    2 Minute Post BP  112/52  -       Psychological, QOL, Others - Outcomes: PHQ 2/9: Depression screen [facility] 2/9 06/24/2017 04/17/2017 04/04/2017 03/29/2017 03/28/2017  Decreased Interest 0 0 0 0 0  Down, Depressed, Hopeless 0 0 0 0 0  PHQ - 2 Score 0 0 0 0 0    Quality of Life: Quality of Life - 06/21/17 1446      Quality of Life Scores   Health/Function Pre  27.83 %    Health/Function Post  25.97 %    Health/Function % Change  -6.68 %    Socioeconomic Pre  28.21 %    Socioeconomic Post  26.43 %     Socioeconomic % Change   -6.31 %    Psych/Spiritual Pre  28.36 %    Psych/Spiritual Post  27.29 %    Psych/Spiritual % Change  -3.77 %    Family Pre  27.3 %    Family Post  24.6 %    Family % Change  -9.89 %    GLOBAL Pre  27.94 %    GLOBAL Post  26.13 %    GLOBAL % Change  -6.48 %       Personal Goals: Goals established at orientation with interventions provided to work toward goal. Personal Goals and Risk Factors at Admission - 03/19/17 1228      Core Components/Risk Factors/Patient Goals on Admission   Hypertension  Yes    Intervention  Provide education on lifestyle modifcations including regular physical activity/exercise, weight management, moderate sodium restriction and increased consumption of fresh fruit, vegetables, and low fat dairy, alcohol moderation, and smoking cessation.;Monitor prescription use compliance.    Lipids  Yes    Intervention  Provide education and support for participant on nutrition & aerobic/resistive exercise along with prescribed medications to achieve LDL <34m, HDL >411m    Expected Outcomes  Short Term: Participant states understanding of desired cholesterol values and is compliant with medications prescribed. Participant is following exercise prescription and nutrition guidelines.;Long Term: Cholesterol controlled with medications as prescribed, with individualized exercise RX and with personalized nutrition plan. Value goals: LDL < 7059mHDL > 40  mg.        Personal Goals Discharge: Goals and Risk Factor Review    Row Name 04/30/17 1407 05/28/17 1734 06/20/17 1607         Core Components/Risk Factors/Patient Goals Review   Personal Goals Review  Hypertension;Lipids  Hypertension;Lipids  Hypertension;Lipids     Review  Milbert Coulter blood pressures have been stable at cardiac rehab  Milbert Coulter blood pressures have been stable at cardiac rehab  Milbert Coulter blood pressures have been stable at cardiac rehab     Expected Outcomes  Patient will continue to come to  exercise at cardiac rehab. Will continue to monitor blood pressures at cardiac rehab.  Patient will continue to come to exercise at cardiac rehab. Will continue to monitor blood pressures at cardiac rehab.  Patient will continue to come to exercise at cardiac rehab. Will continue to monitor blood pressures at cardiac rehab.        Exercise Goals and Review: Exercise Goals    Row Name 03/19/17 [phone]             Exercise Goals   Increase Physical Activity  Yes       Intervention  Provide advice, education, support and counseling about physical activity/exercise needs.;Develop an individualized exercise prescription for aerobic and resistive training based on initial evaluation findings, risk stratification, comorbidities and participant's personal goals.       Expected Outcomes  Achievement of increased cardiorespiratory fitness and enhanced flexibility, muscular endurance and strength shown through measurements of functional capacity and personal statement of participant. improve walking tolerance       Increase Strength and Stamina  Yes       Intervention  Provide advice, education, support and counseling about physical activity/exercise needs.;Develop an individualized exercise prescription for aerobic and resistive training based on initial evaluation findings, risk stratification, comorbidities and participant's personal goals.       Expected Outcomes  Achievement of increased cardiorespiratory fitness and enhanced flexibility, muscular endurance and strength shown through measurements of functional capacity and personal statement of participant.          Nutrition & Weight - Outcomes: Pre Biometrics - 03/19/17 1232      Pre Biometrics   Height  5' 7.5" (1.715 m)    Weight  174 lb 6.1 oz (79.1 kg)    Waist Circumference  41 inches    Hip Circumference  40 inches    Waist to Hip Ratio  1.02 %    BMI (Calculated)  27    Triceps Skinfold  18 mm    % Body Fat  29.2 %    Grip Strength   23 kg    Flexibility  9.75 in    Single Leg Stand  1.2 seconds      Post Biometrics - 06/24/17 1613       Post  Biometrics   Height  5' 7.5" (1.715 m)    Weight  170 lb 13.7 oz (77.5 kg)    Waist Circumference  39.5 inches    Hip Circumference  40 inches    Waist to Hip Ratio  0.99 %    BMI (Calculated)  26.35    Triceps Skinfold  15 mm    % Body Fat  27.5 %    Grip Strength  24.5 kg    Flexibility  13.5 in    Single Leg Stand  7.21 seconds       Nutrition: Nutrition Therapy & Goals - 03/19/17 1222  Nutrition Therapy   Diet  Therapeutic Lifestyle Changes      Personal Nutrition Goals   Nutrition Goal  Pt to identify food quantities necessary to achieve weight loss of 6-10 lb at graduation from cardiac rehab. Goal wt of 160 lb desired.       Intervention Plan   Intervention  Prescribe, educate and counsel regarding individualized specific dietary modifications aiming towards targeted core components such as weight, hypertension, lipid management, diabetes, heart failure and other comorbidities.    Expected Outcomes  Short Term Goal: Understand basic principles of dietary content, such as calories, fat, sodium, cholesterol and nutrients.;Long Term Goal: Adherence to prescribed nutrition plan.       Nutrition Discharge: Nutrition Assessments - 07/03/17 1035      MEDFICTS Scores   Pre Score  72    Post Score  43    Score Difference  -29       Education Questionnaire Score: Knowledge Questionnaire Score - 06/21/17 1446      Knowledge Questionnaire Score   Pre Score  23/24    Post Score  23/24       Goals reviewed with patient; copy given to patient.Abbas graduated from cardiac rehab program today with completion of 32 exercise sessions in Phase II. Pt maintained good attendance and progressed nicely during his participation in rehab as evidenced by increased MET level.   Medication list reconciled. Repeat  PHQ score-0  .  Pt has made significant lifestyle changes  and should be commended for his success. Pt feels he has achieved his goals during cardiac rehab.   Pt plans to continue exercise by walking his dog twice a day and bowling once a week. We are proud of Tomy's progress and weight loss. Mael increased his distance on his post exercise walk test.Maria Venetia Maxon, RN,BSN 07/16/2017 5:00 PM

## 2017-06-26 ENCOUNTER — Encounter (HOSPITAL_COMMUNITY): Payer: Medicare Other

## 2017-06-28 ENCOUNTER — Encounter (HOSPITAL_COMMUNITY): Payer: Medicare Other

## 2017-10-21 ENCOUNTER — Other Ambulatory Visit: Payer: Self-pay | Admitting: Cardiovascular Disease

## 2017-11-13 NOTE — Progress Notes (Signed)
Cardiology Office Note    Date:  11/19/2017   ID:  [name], DOB Feb 21, 1928, MRN [id number]  PCP:  Helane Rima, MD  Cardiologist:  Dr. Johnsie Cancel  No chief complaint on file.   History of Present Illness:   84 y.o. f/u for CAD and PVD. History of DES to RCA July 3557 complicated by right radial AV fistula. Returned 02/27/17 with DES To Ramus. Lifelong DAT recommended History of AV block on beta blockers. Post left CEA with duplex 12/06/16 plaque no restenosis or new denovo disease. CRF;s include HLD and HTN. Compliant with meds EF has been normal   He has a residual fistula in right radial artery VVS knows about but does not need correction no steal  Past Medical History:  Diagnosis Date  . AV BLOCK, 1ST DEGREE   . Carotid artery disease ([place])    a. s/p Left ECA followed by Dr. Donnetta Hutching  . Coronary artery disease   . Coronary artery disease involving coronary bypass graft of native heart with angina pectoris ([place])    a. LHC 12/2015  3vd Left Cx 100% with colaterals, and distally LAD occluded, DES to prox-mid RCA  b. 02/2017: repeat cath with patent RCA stent and stable CAD; 02/27/2017: Rota-PCI to Ost RI  . GERD (gastroesophageal reflux disease)   . HLD (hyperlipidemia)   . HTN (hypertension)   . NSVT (nonsustained ventricular tachycardia) ([place])    during stress test 12/2015  . Presence of drug coated stent in RCA & Ramus Intermedius. 02/28/2017   12/2015: PCI p-mRCA - Stent Resolute Integ 3.5x34 02/2017: Rota-PCI o-mRamus - overlapping DES STENT SYNERGY DES 3X32  & 3x24 (tapered post-dilation)  . Stroke [facility])     Past Surgical History:  Procedure Laterality Date  . CARDIAC CATHETERIZATION     x2  . CARDIAC CATHETERIZATION N/A 01/11/2016   Procedure: Left Heart Cath and Coronary Angiography;  Surgeon: Wellington Hampshire, MD;  Location: [facility];  Service: Cardiovascular;  Laterality: N/A;  . CAROTID ENDARTERECTOMY  11/28/2006   left  . CORONARY ATHERECTOMY N/A 02/27/2017   Procedure: Coronary Atherectomy;  Surgeon: Troy Sine, MD;  Location: [facility];  Service: Cardiovascular;  Laterality: N/A;  . HERNIA REPAIR    . LEFT HEART CATH AND CORONARY ANGIOGRAPHY N/A 02/21/2017   Procedure: Left Heart Cath and Coronary Angiography;  Surgeon: Lorretta Harp, MD;  Location: [facility];  Service: Cardiovascular;  Laterality: N/A;  . TONSILLECTOMY      Current Medications: Current Meds  Medication Sig  . acetaminophen (TYLENOL) 500 MG tablet Take 1 tablet (500 mg total) by mouth every 6 (six) hours as needed for moderate pain (wrist).  Marland Kitchen amLODipine (NORVASC) 10 MG tablet TAKE 1 TABLET DAILY  . aspirin EC 81 MG EC tablet Take 1 tablet (81 mg total) by mouth daily.  Marland Kitchen b complex vitamins tablet Take 1 tablet by mouth daily.  . Calcium Carbonate (CALCIUM 500 PO) Take 1 tablet by mouth daily.  . Cholecalciferol (VITAMIN D-3) 1000 units CAPS Take 1 capsule by mouth daily.  . clopidogrel (PLAVIX) 75 MG tablet Take 1 tablet (75 mg total) by mouth daily with breakfast.  . Glucosamine 500 MG CAPS Take 1 capsule by mouth daily.   . isosorbide mononitrate (IMDUR) 60 MG 24 hr tablet Take 1.5 tablets (90 mg total) by mouth daily.  Marland Kitchen lisinopril (PRINIVIL,ZESTRIL) 40 MG tablet TAKE 1 TABLET DAILY  . Multiple Vitamin (MULTIVITAMIN) capsule Take  1 capsule by mouth daily.  . nitroGLYCERIN (NITROSTAT) 0.4 MG SL tablet Place 0.4 mg under the tongue every 5 (five) minutes as needed for chest pain (not to exceed 3 doses.).  Marland Kitchen Omega-3 Fatty Acids (FISH OIL) 1000 MG CAPS Take 1 capsule by mouth daily.  Marland Kitchen OVER THE COUNTER MEDICATION 600 mg as needed.  . ranolazine (RANEXA) 1000 MG SR tablet Take 1 tablet (1,000 mg total) by mouth 2 (two) times daily.  . simvastatin (ZOCOR) 20 MG tablet TAKE 1 TABLET DAILY  . vitamin C (ASCORBIC ACID) 500 MG tablet Take 500 mg by mouth daily.       Allergies:   Patient has no known allergies.   Social History   Socioeconomic History   . Marital status: Married    Spouse name: Not on file  . Number of children: Not on file  . Years of education: Not on file  . Highest education level: Not on file  Occupational History  . Not on file  Social Needs  . Financial resource strain: Not on file  . Food insecurity:    Worry: Not on file    Inability: Not on file  . Transportation needs:    Medical: Not on file    Non-medical: Not on file  Tobacco Use  . Smoking status: Never Smoker  . Smokeless tobacco: Never Used  Substance and Sexual Activity  . Alcohol use: Yes    Alcohol/week: 9.0 oz    Types: 15 Glasses of wine per week  . Drug use: No  . Sexual activity: Not on file  Lifestyle  . Physical activity:    Days per week: Not on file    Minutes per session: Not on file  . Stress: Not on file  Relationships  . Social connections:    Talks on phone: Not on file    Gets together: Not on file    Attends religious service: Not on file    Active member of club or organization: Not on file    Attends meetings of clubs or organizations: Not on file    Relationship status: Not on file  Other Topics Concern  . Not on file  Social History Narrative  . Not on file     Family History:  The patient's  family history includes Cancer (age of onset: 29) in his sister; Diabetes in his mother and sister; Heart disease in his sister.   ROS:   Please see the history of present illness.    Review of Systems  Constitution: Negative.  HENT: Negative.   Cardiovascular: Negative.   Respiratory: Negative.   Endocrine: Negative.   Hematologic/Lymphatic: Negative.   Musculoskeletal: Negative.   Gastrointestinal: Negative.   Genitourinary: Negative.   Neurological: Negative.    All other systems reviewed and are negative.   PHYSICAL EXAM:   VS:  BP 116/64   Pulse (!) 57   Ht 5\' 6"  (1.676 m)   Wt 172 lb 8 oz (78.2 kg)   SpO2 96%   BMI 27.84 kg/m   Physical Exam  Affect appropriate Healthy:  appears stated  age HEENT: normal Neck supple with no adenopathy JVP normal post left CEA no thyromegaly Lungs clear with no wheezing and good diaphragmatic motion Heart:  S1/S2 no murmur, no rub, gallop or click PMI normal Abdomen: benighn, BS positve, no tenderness, no AAA no bruit.  No HSM or HJR Distal pulses intact with no bruits No edema Neuro non-focal Skin warm and dry  No muscular weakness Thrill and vibratory pulsation in right radial artery    Wt Readings from Last 3 Encounters:  11/19/17 172 lb 8 oz (78.2 kg)  06/24/17 170 lb 13.7 oz (77.5 kg)  05/09/17 169 lb (76.7 kg)      Studies/Labs Reviewed:   EKG:  EKG is ordered today.  The ekg ordered today demonstrates Normal sinus rhythm with first-degree AV block, PVC  Recent Labs: 02/24/2017: Magnesium 1.8 02/28/2017: ALT 142; BUN 23; Creatinine, Ser 1.32; Hemoglobin 10.9; Platelets 170; Potassium 4.2; Sodium 138   Lipid Panel    Component Value Date/Time   CHOL 109 02/21/2017 0204   TRIG 52 02/21/2017 0204   HDL 46 02/21/2017 0204   CHOLHDL 2.4 02/21/2017 0204   VLDL 10 02/21/2017 0204   LDLCALC 53 02/21/2017 0204    Additional studies/ records that were reviewed today include:   LHC: 02/27/17   Conclusion       Prox RCA to Mid RCA lesion, 0 %stenosed.  Mid RCA lesion, 30 %stenosed.  Dist RCA lesion, 70 %stenosed.  Ost Cx to Prox Cx lesion, 100 %stenosed.  Dist LAD lesion, 100 %stenosed.  Mid LAD lesion, 50 %stenosed.  Ost LAD to Prox LAD lesion, 60 %stenosed.  Ramus-2 lesion, 70 %stenosed.  A STENT SYNERGY DES 3X32 drug eluting stent was successfully placed.  Ramus-1 lesion, 80 %stenosed.  Post intervention, there is a 0% residual stenosis.  A STENT SYNERGY DES 3X24 drug eluting stent was successfully placed, and overlaps previously placed stent.  Ost Ramus lesion, 95 %stenosed.  Post intervention, there is a 0% residual stenosis.   Successful complex PCI to the ramus intermediate vessel which had a  95% ostial stenosis followed by diffuse 80% proximal stenosis and 70% mid stenosis.  The three lesion were treated with high-speed rotational atherectomy with a 1.5 mm and a 1.75 mm burr,  cutting balloon atherotomy with a 3.010 mm Wolverine balloon, and tandem DES stenting with a 3.032 mm and 3.024 mm Synergy DES stents postdilated to 3.28 m millimeters ostially to 3.21 mm distally with the entire segments being reduced to 0%.    RECOMMENDATION: Lifelong DAPT.  Continue aggressive medical therapy for the patient's severe concomitant CAD.       ASSESSMENT:    No diagnosis found.   PLAN:  In order of problems listed above:  CAD status post PCI with high-speed rotational atherectomy in overlapping drug-eluting stents to the ramus 02/27/17. Currently without angina. Continue Imdur, Ranexa, Plavix and aspirin Has patent stent to mid RCA. Residual distal RCA and occluded distal LAD   HTN:   Well controlled.  Continue current medications and low sodium Dash type diet.  Avoid beta blocker history of AV block  HLD  continue simvastatin.labs with primary   Carotid:  F/u VVS post left CEA duplex 11/2016 1-39% bilateral plaque   Fistula:  Right radial post cath no steal no symptoms but somewhat impressive on exam   Jenkins Rouge

## 2017-11-19 ENCOUNTER — Encounter: Payer: Self-pay | Admitting: Cardiovascular Disease

## 2017-11-19 ENCOUNTER — Ambulatory Visit (INDEPENDENT_AMBULATORY_CARE_PROVIDER_SITE_OTHER): Payer: Medicare Other | Admitting: Cardiovascular Disease

## 2017-11-19 VITALS — BP 116/64 | HR 57 | Ht 66.0 in | Wt 172.5 lb

## 2017-11-19 DIAGNOSIS — I2510 Atherosclerotic heart disease of native coronary artery without angina pectoris: Secondary | ICD-10-CM | POA: Diagnosis not present

## 2017-11-19 NOTE — Patient Instructions (Signed)

## 2017-12-12 ENCOUNTER — Encounter (HOSPITAL_COMMUNITY): Payer: Medicare Other

## 2017-12-12 ENCOUNTER — Ambulatory Visit: Payer: Medicare Other | Admitting: Family

## 2018-01-02 ENCOUNTER — Other Ambulatory Visit: Payer: Self-pay | Admitting: Cardiovascular Disease

## 2018-01-23 ENCOUNTER — Encounter: Payer: Self-pay | Admitting: Family

## 2018-01-23 ENCOUNTER — Ambulatory Visit (INDEPENDENT_AMBULATORY_CARE_PROVIDER_SITE_OTHER): Payer: Medicare Other | Admitting: Family

## 2018-01-23 ENCOUNTER — Ambulatory Visit (HOSPITAL_COMMUNITY)
Admission: RE | Admit: 2018-01-23 | Discharge: 2018-01-23 | Disposition: A | Discharge status: Home / Self Care | Payer: Medicare Other | Source: Ambulatory Visit | Attending: Family | Admitting: Family

## 2018-01-23 VITALS — BP 142/67 | HR 55 | Temp 96.7°F | Resp 16 | Ht 66.0 in | Wt 166.0 lb

## 2018-01-23 DIAGNOSIS — Z9889 Other specified postprocedural states: Secondary | ICD-10-CM

## 2018-01-23 DIAGNOSIS — I6523 Occlusion and stenosis of bilateral carotid arteries: Secondary | ICD-10-CM

## 2018-01-23 DIAGNOSIS — I10 Essential (primary) hypertension: Secondary | ICD-10-CM | POA: Diagnosis not present

## 2018-01-23 DIAGNOSIS — I2510 Atherosclerotic heart disease of native coronary artery without angina pectoris: Secondary | ICD-10-CM | POA: Diagnosis not present

## 2018-01-23 NOTE — Patient Instructions (Addendum)
To decrease swelling in your feet and legs: Elevate feet above slightly bent knees, feet above heart, overnight and 3-4 times per day for 20 minutes.  To measure for knee high compression hose: Measure the length of calf (from the crease of the knee to the bottom of the heel), largest circumference of calf, and ankle circumference first thing in the morning before your legs have a chance to swell.  Take these 3 measurements with you to obtain 20-30 mm mercury graduated knee high compression hose.  Put the stockings on in the morning, remove at bedtime.     Stroke Prevention Some health problems and behaviors may make it more likely for you to have a stroke. Below are ways to lessen your risk of having a stroke.  Be active for at least 30 minutes on most or all days.  Do not smoke. Try not to be around others who smoke.  Do not drink too much alcohol. ? Do not have more than 2 drinks a day if you are a man. ? Do not have more than 1 drink a day if you are a woman and are not pregnant.  Eat healthy foods, such as fruits and vegetables. If you were put on a specific diet, follow the diet as told.  Keep your cholesterol levels under control through diet and medicines. Look for foods that are low in saturated fat, trans fat, cholesterol, and are high in fiber.  If you have diabetes, follow all diet plans and take your medicine as told.  Ask your doctor if you need treatment to lower your blood pressure. If you have high blood pressure (hypertension), follow all diet plans and take your medicine as told by your doctor.  If you are 18-39 years old, have your blood pressure checked every 3-5 years. If you are age 40 or older, have your blood pressure checked every year.  Keep a healthy weight. Eat foods that are low in calories, salt, saturated fat, trans fat, and cholesterol.  Do not take drugs.  Avoid birth control pills, if this applies. Talk to your doctor about the risks of taking  birth control pills.  Talk to your doctor if you have sleep problems (sleep apnea).  Take all medicine as told by your doctor. ? You may be told to take aspirin or blood thinner medicine. Take this medicine as told by your doctor. ? Understand your medicine instructions.  Make sure any other conditions you have are being taken care of.  Get help right away if:  You suddenly lose feeling (you feel numb) or have weakness in your face, arm, or leg.  Your face or eyelid hangs down to one side.  You suddenly feel confused.  You have trouble talking (aphasia) or understanding what people are saying.  You suddenly have trouble seeing in one or both eyes.  You suddenly have trouble walking.  You are dizzy.  You lose your balance or your movements are clumsy (uncoordinated).  You suddenly have a very bad headache and you do not know the cause.  You have new chest pain.  Your heart feels like it is fluttering or skipping a beat (irregular heartbeat). Do not wait to see if the symptoms above go away. Get help right away. Call your local emergency services (911 in [place] Do not drive yourself to the hospital. This information is not intended to replace advice given to you by your health care provider. Make sure you discuss any questions you have   with your health care provider. Document Released: 02/05/2012 Document Revised: 01/12/2016 Document Reviewed: 02/06/2013 Elsevier Interactive Patient Education  2018 Elsevier Inc.  

## 2018-01-23 NOTE — Progress Notes (Signed)
Chief Complaint: Follow up Extracranial Carotid Artery Stenosis   History of Present Illness  [name] is a 84 y.o. male who is status post left CEA in April 2008 by Dr. Donnetta Hutching. He returns today for follow up. He was hospitalized at [facility] in 2008, prior to the CEA, for right hand shaking, he was told that he had a mild stroke. The patient denies any history of other TIA or stroke symptoms other than the right hand shaking, specifically he denies a history of amaurosis fugax or monocular blindness,  unilateral facial drooping, hemiplegia, or receptive or expressive aphasia.  He has had no subsequent stroke or TIA.   He had cardiac stent placed on 12/2015, DES to prox-mid RCA; 3vd Left Cx 100% has collaterals, and distally LAD was occluded.  He denies having any symptoms prior to the stent placed, denies feeling any different after stent placed. Suspicion for coronary artery stenosis was found on routine cardiac evaluation stress test.  He also has a hx of 1st degree AVB.  He denies any dyspnea or chest pain.   He drinks 3-4 glasses of white wine daily.  He walks about 30 minutes daily. He denies claudication type symptoms with walking, denies non-healing wounds.  Apparently, per his description, he developed an arteriovenous fistula at his right wrist after cardiac cath using right radial artery access.   He is using an antifungal cream on an improving round rash on both ankles.   Pt states the swelling in his lower legs resolves by morning.   Diabetic: No Tobacco use: non-smoker, he was exposed to his wife's second hand smoke for 50 years. His wife no longer smokes.   Pt meds include: Statin : Yes ASA: Yes Other anticoagulants/antiplatelets: Plavix added after cardiac stent placed in May 2017   Past Medical History:  Diagnosis Date  . AV BLOCK, 1ST DEGREE   . Carotid artery disease ([place])    a. s/p Left ECA followed by Dr. Donnetta Hutching  . Coronary artery disease   .  Coronary artery disease involving coronary bypass graft of native heart with angina pectoris ([name])    a. LHC 12/2015  3vd Left Cx 100% with colaterals, and distally LAD occluded, DES to prox-mid RCA  b. 02/2017: repeat cath with patent RCA stent and stable CAD; 02/27/2017: Rota-PCI to Ost RI  . GERD (gastroesophageal reflux disease)   . HLD (hyperlipidemia)   . HTN (hypertension)   . NSVT (nonsustained ventricular tachycardia) ([place])    during stress test 12/2015  . Presence of drug coated stent in RCA & Ramus Intermedius. 02/28/2017   12/2015: PCI p-mRCA - Stent Resolute Integ 3.5x34 02/2017: Rota-PCI o-mRamus - overlapping DES STENT SYNERGY DES 3X32  & 3x24 (tapered post-dilation)  . Stroke [facility])     Social History Social History   Tobacco Use  . Smoking status: Never Smoker  . Smokeless tobacco: Never Used  Substance Use Topics  . Alcohol use: Yes    Alcohol/week: 9.0 oz    Types: 15 Glasses of wine per week  . Drug use: No    Family History Family History  Problem Relation Age of Onset  . Diabetes Mother   . Diabetes Sister   . Cancer Sister 2       colon  . Heart disease Sister     Surgical History Past Surgical History:  Procedure Laterality Date  . CARDIAC CATHETERIZATION     x2  . CARDIAC CATHETERIZATION N/A 01/11/2016   Procedure:  Left Heart Cath and Coronary Angiography;  Surgeon: Wellington Hampshire, MD;  Location: [facility];  Service: Cardiovascular;  Laterality: N/A;  . CAROTID ENDARTERECTOMY  11/28/2006   left  . CORONARY ATHERECTOMY N/A 02/27/2017   Procedure: Coronary Atherectomy;  Surgeon: Troy Sine, MD;  Location: [place];  Service: Cardiovascular;  Laterality: N/A;  . HERNIA REPAIR    . LEFT HEART CATH AND CORONARY ANGIOGRAPHY N/A 02/21/2017   Procedure: Left Heart Cath and Coronary Angiography;  Surgeon: Lorretta Harp, MD;  Location: [facility];  Service: Cardiovascular;  Laterality: N/A;  . TONSILLECTOMY      Allergies   Allergen Reactions  . Beta Adrenergic Blockers Other (See Comments)    Hx of AV block. Should avoid    Current Outpatient Medications  Medication Sig Dispense Refill  . acetaminophen (TYLENOL) 500 MG tablet Take 1 tablet (500 mg total) by mouth every 6 (six) hours as needed for moderate pain (wrist). 30 tablet 0  . amLODipine (NORVASC) 10 MG tablet TAKE 1 TABLET DAILY 90 tablet 1  . aspirin EC 81 MG EC tablet Take 1 tablet (81 mg total) by mouth daily.    Marland Kitchen b complex vitamins tablet Take 1 tablet by mouth daily.    . Calcium Carbonate (CALCIUM 500 PO) Take 1 tablet by mouth daily.    . Cholecalciferol (VITAMIN D-3) 1000 units CAPS Take 1 capsule by mouth daily.    . clopidogrel (PLAVIX) 75 MG tablet Take 1 tablet (75 mg total) by mouth daily with breakfast. 90 tablet 2  . Glucosamine 500 MG CAPS Take 1 capsule by mouth daily.     . isosorbide mononitrate (IMDUR) 60 MG 24 hr tablet TAKE ONE AND ONE-HALF TABLETS DAILY 135 tablet 3  . lisinopril (PRINIVIL,ZESTRIL) 40 MG tablet TAKE 1 TABLET DAILY 90 tablet 1  . Multiple Vitamin (MULTIVITAMIN) capsule Take 1 capsule by mouth daily.    . nitroGLYCERIN (NITROSTAT) 0.4 MG SL tablet Place 0.4 mg under the tongue every 5 (five) minutes as needed for chest pain (not to exceed 3 doses.).    Marland Kitchen Omega-3 Fatty Acids (FISH OIL) 1000 MG CAPS Take 1 capsule by mouth daily.    Marland Kitchen OVER THE COUNTER MEDICATION 600 mg as needed.    Marland Kitchen RANEXA 1000 MG SR tablet TAKE 1 TABLET TWICE A DAY 180 tablet 3  . simvastatin (ZOCOR) 20 MG tablet TAKE 1 TABLET DAILY 90 tablet 1  . vitamin C (ASCORBIC ACID) 500 MG tablet Take 500 mg by mouth daily.       No current facility-administered medications for this visit.     Review of Systems : See HPI for pertinent positives and negatives.  Physical Examination  Vitals:   01/23/18 1447  BP: (!) 142/67  Pulse: (!) 55  Resp: 16  Temp: (!) 96.7 F (35.9 C)  TempSrc: Oral  SpO2: 96%  Weight: 166 lb (75.3 kg)  Height: 5'  6" (1.676 m)   Body mass index is 26.79 kg/m.  General: WDWN male in NAD GAIT: normal Eyes: PERRLA HENT: No gross abnormalities.  Pulmonary:  Respirations are non-labored, good air movement, CTAB, no rales, rhonchi, or wheezing. Cardiac: regular rhythm, bradycardic, no detected murmur.  VASCULAR EXAM Carotid Bruits Right Left   Positive Negative     Abdominal aortic pulse is not palpable. Radial pulses are 2+ palpable and equal.         Small arteriovenous fistula right wrist with  palpable thrill                                                                                                                LE Pulses Right Left       POPLITEAL  not palpable   not palpable       POSTERIOR TIBIAL  not palpable   2+ palpable        DORSALIS PEDIS      ANTERIOR TIBIAL 2+ palpable  not palpable     Gastrointestinal: soft, nontender, BS WNL, no r/g, no palpable masses. Musculoskeletal: no muscle atrophy/wasting. M/S 5/5 throughout, extremities without ischemic changes. 1+ bilateral pretibial pitting edema.  Skin:  no ulcers, no cellulitis.  Round scaly rash left ankle, round lesion mildly erythematous on right ankle  Neurologic:  A&O X 3; appropriate affect, sensation is normal; speech is normal, CN 2-12 intact except has some hearing loss, pain and light touch intact in extremities, motor exam as listed above. Psychiatric: Normal thought content, mood appropriate to clinical situation.     Assessment: [name] is a 84 y.o. male who is status post left CEA in April 2008. He had a preoperative stoke; residual neurological deficit is tremor of the right hand, no subsequent stroke or TIA.  Fortunately he does not have DM and has never used tobacco, but he was exposed for 50 years to his wife's smoking in their house..    Mild dependent edema in lower legs, knee high compression hose advised, see Patient instructions.   DATA Carotid Duplex (01/23/18): Right ICA: 40-59%  stenosis Left ICA: CEA site with 1-39% stenosis Bilateral vertebral artery flow is antegrade.  Bilateral subclavian artery waveforms are normal.  Increased stenosis in bilateral ICA compared to the exam on 12-06-16.   Plan: Follow-up in 1year with Carotid Duplex scan.   I discussed in depth with the patient the nature of atherosclerosis, and emphasized the importance of maximal medical management including strict control of blood pressure, blood glucose, and lipid levels, obtaining regular exercise, and continued cessation of smoking.  The patient is aware that without maximal medical management the underlying atherosclerotic disease process will progress, limiting the benefit of any interventions. The patient was given information about stroke prevention and what symptoms should prompt the patient to seek immediate medical care. Thank you for allowing [place] to participate in this patient's care.  Clemon Chambers, RN, MSN, FNP-C [facility] Office: [phone]  Clinic Physician: Oneida Alar  01/23/18 2:52 PM

## 2018-03-03 ENCOUNTER — Telehealth: Payer: Self-pay | Admitting: Cardiovascular Disease

## 2018-03-03 DIAGNOSIS — R0989 Other specified symptoms and signs involving the circulatory and respiratory systems: Secondary | ICD-10-CM

## 2018-03-03 MED ORDER — CLOPIDOGREL BISULFATE 75 MG PO TABS: 75.0000 mg | ORAL_TABLET | Freq: Every day | ORAL | 2 refills | Status: DC

## 2018-03-03 NOTE — Telephone Encounter (Signed)
New Message     *STAT* If patient is at the pharmacy, call can be transferred to refill team.   1. Which medications need to be refilled? (please list name of each medication and dose if known) clopidogrel (PLAVIX) 75 MG tablet  2. Which pharmacy/location (including street and city if local pharmacy) is medication to be sent to? [facility] [place], [place] - [address]  3. Do they need a 30 day or 90 day supply? [place]

## 2018-03-03 NOTE — Telephone Encounter (Signed)
Pt's medication was sent to pt's pharmacy as requested confirmation received.  °

## 2018-04-04 ENCOUNTER — Other Ambulatory Visit: Payer: Self-pay | Admitting: Cardiovascular Disease

## 2018-04-19 ENCOUNTER — Other Ambulatory Visit: Payer: Self-pay | Admitting: Cardiovascular Disease

## 2018-09-03 IMAGING — CR DG CHEST 2V
3 series · 3 of 3 positions shown · non-contrast
Comparison: 02/20/2017 and 11/19/2006 radiographs

CLINICAL DATA: Acute chest pain. Patient with cardiac
catheterization performed earlier today.

EXAM:
CHEST  2 VIEW

[chest lat (1 of 2)]
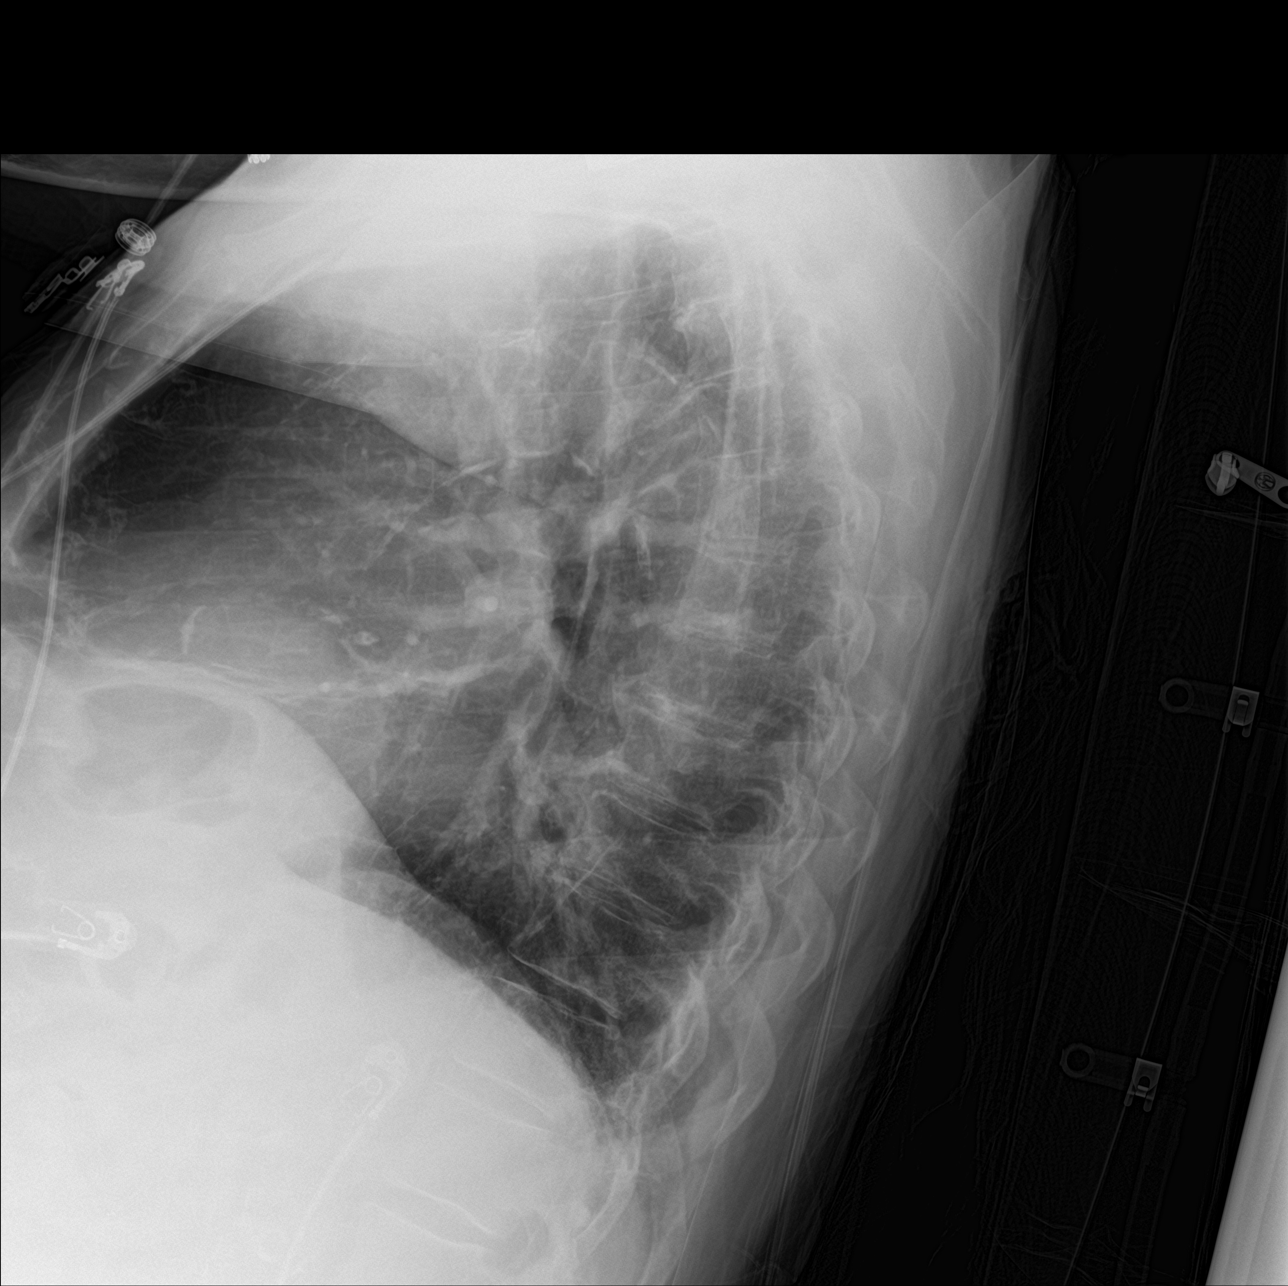

[chest ap]
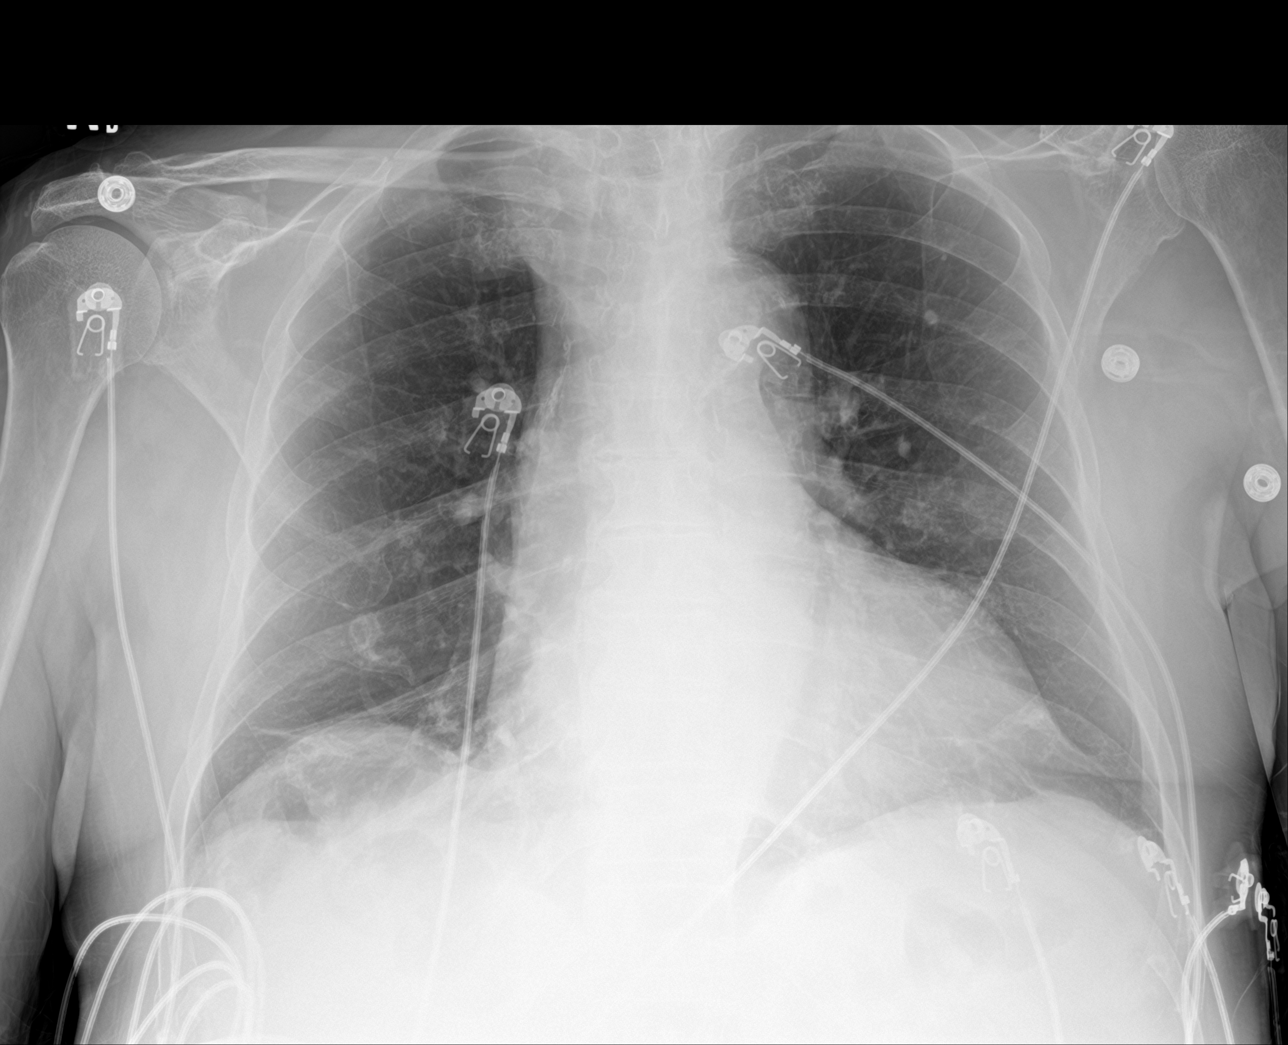

[chest lat (2 of 2)]
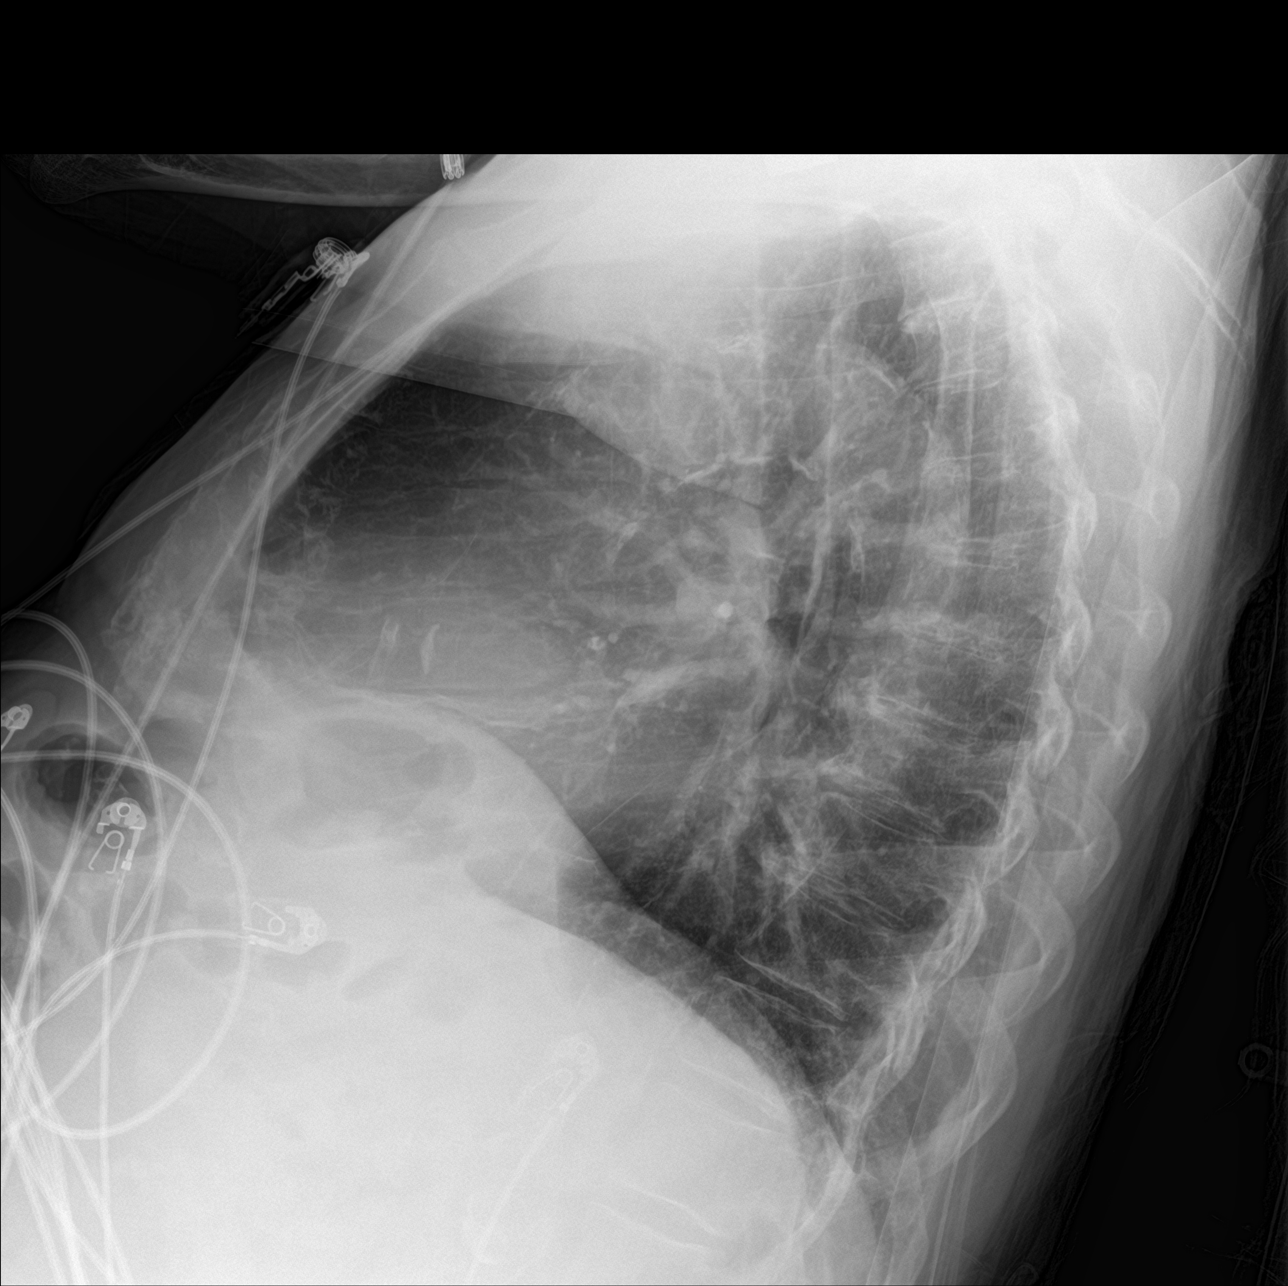

[3 of 3 positions shown; findings below may reference images not displayed]

FINDINGS: Cardiomegaly identified.  The mediastinal silhouette is unchanged.

Mild bibasilar scarring and thoracic aortic atherosclerotic
calcification again noted.

There is no evidence of focal airspace disease, pulmonary edema,
suspicious pulmonary nodule/mass, pleural effusion, or pneumothorax.
No acute bony abnormalities are identified.
IMPRESSION: Cardiomegaly without evidence of acute cardiopulmonary disease.

## 2018-10-15 IMAGING — DX DG CHEST 2V
2 series · 2 of 2 positions shown · non-contrast
Comparison: None.

CLINICAL DATA: Cough

EXAM:
CHEST  2 VIEW

[chest pa]
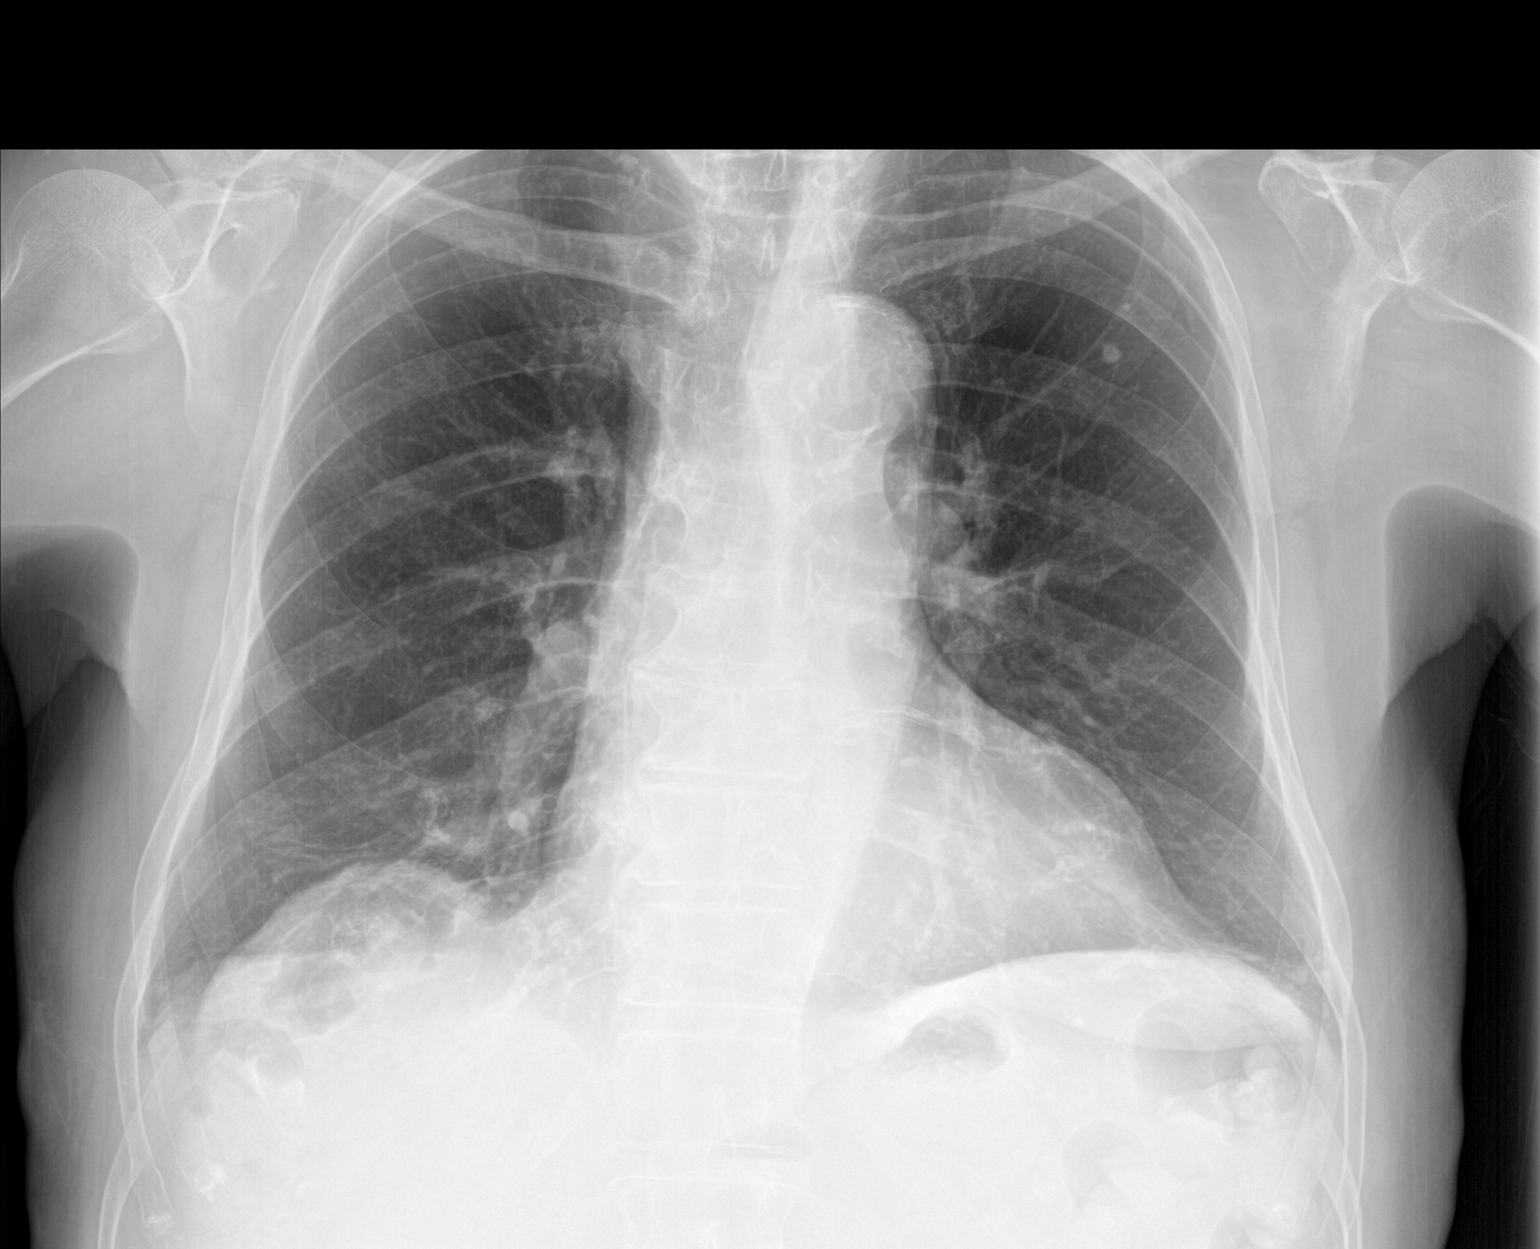

[chest lat]
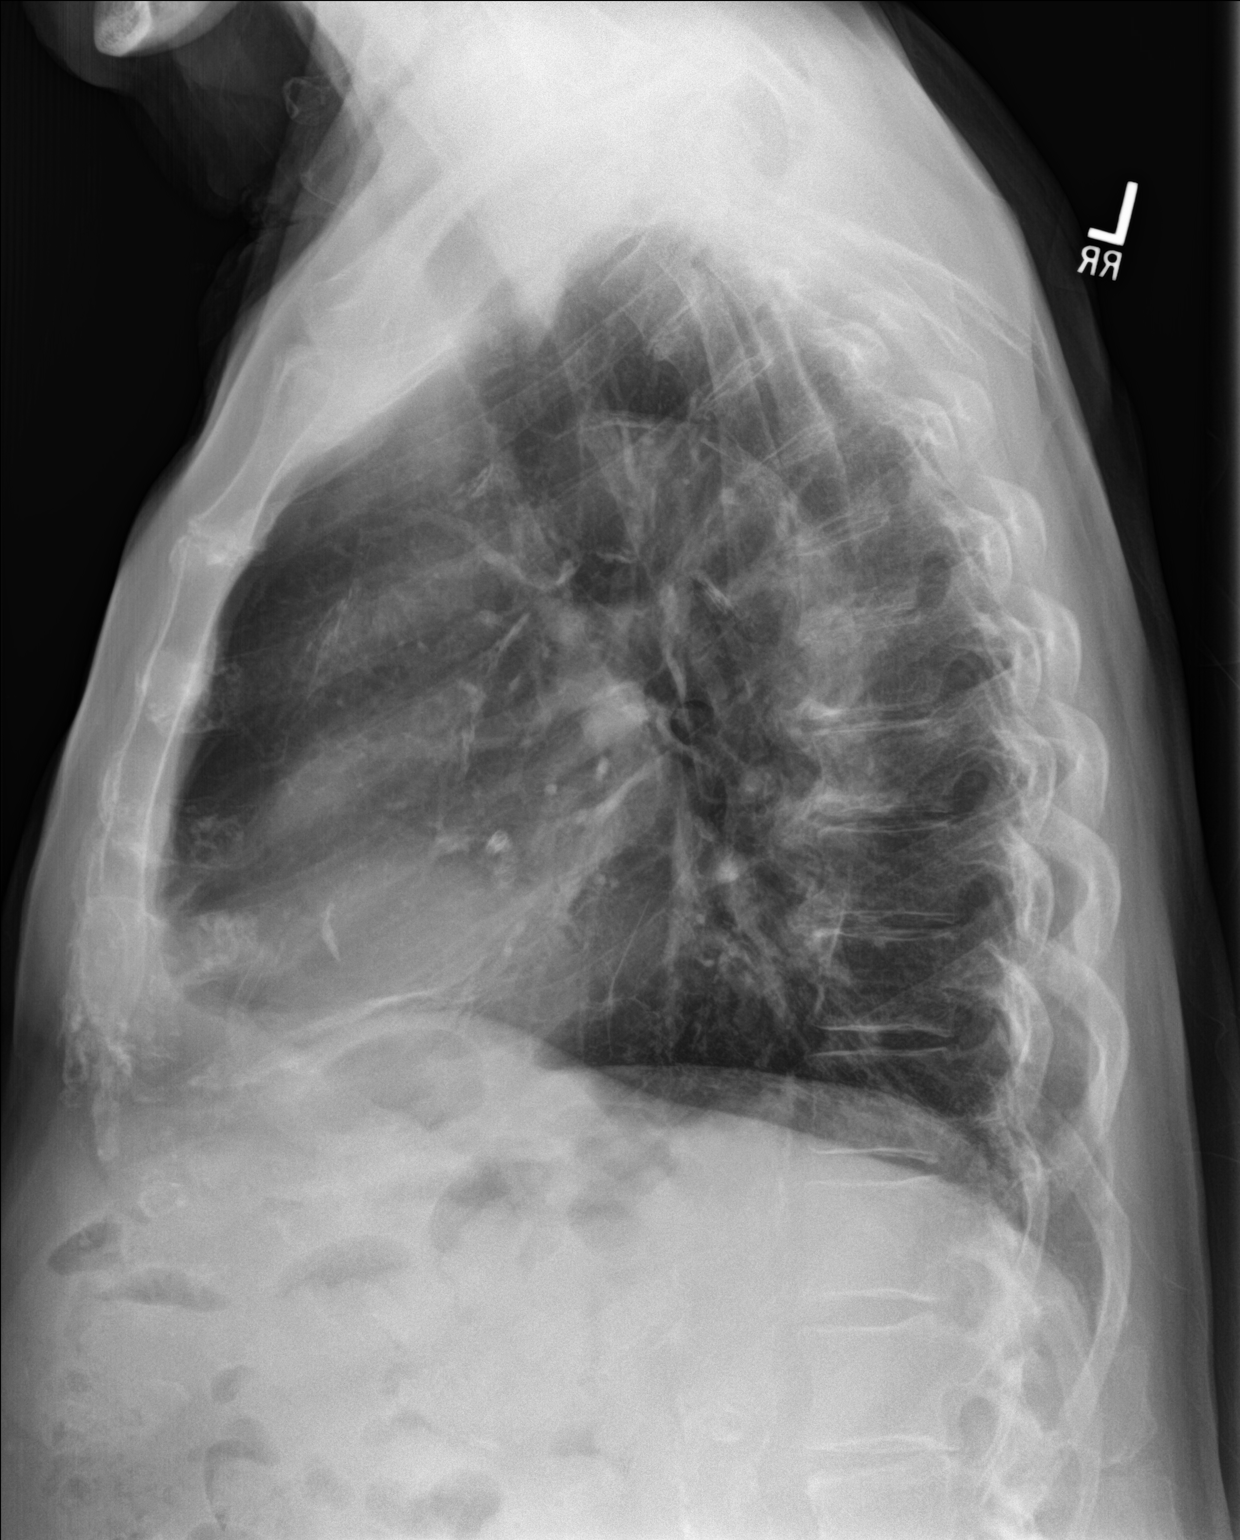

[2 of 2 positions shown; findings below may reference images not displayed]

FINDINGS: The lungs appear somewhat hyperaerated suggesting possible
emphysema. Minimally prominent markings at the right lung base may
represent scarring or linear atelectasis. No definite pneumonia or
pleural effusion is seen. Mediastinal and hilar contours are
unremarkable. The heart is mildly enlarged. Thoracic aortic
atherosclerosis is noted. There are degenerative changes throughout
the thoracic spine.
IMPRESSION: 1. Mild basilar linear atelectasis or scarring. No pneumonia or
effusion.
2. Hyper aeration may indicate an element of emphysema.

## 2018-10-22 ENCOUNTER — Other Ambulatory Visit: Payer: Self-pay | Admitting: Cardiovascular Disease

## 2018-10-22 DIAGNOSIS — R0989 Other specified symptoms and signs involving the circulatory and respiratory systems: Secondary | ICD-10-CM

## 2018-11-14 ENCOUNTER — Encounter: Payer: Self-pay | Admitting: Cardiovascular Disease

## 2018-11-17 ENCOUNTER — Telehealth: Payer: Self-pay | Admitting: Cardiovascular Disease

## 2018-11-17 NOTE — Telephone Encounter (Signed)
Patient rescheduled to 5/20 for office visit with Dr. Johnsie Cancel.

## 2018-11-17 NOTE — Telephone Encounter (Signed)
   Primary Cardiologist:     Pam call patient postponing this appointment for [name] with a plan for f/u in 6-12 wks or sooner if feasible/necessary.  If symptoms change, he has been instructed to contact our office.   Routing to C19 CANCEL pool for tracking (P CV DIV CV19 CANCEL - reason for visit "other.") and assigning priority (1 = 4-6 wks, 2 = 6-12 wks, 3 = >12 wks).   Jenkins Rouge, MD  11/17/2018 1:14 PM         .

## 2018-11-25 ENCOUNTER — Ambulatory Visit: Payer: Medicare Other | Admitting: Cardiovascular Disease

## 2018-12-21 ENCOUNTER — Other Ambulatory Visit: Payer: Self-pay | Admitting: Cardiovascular Disease

## 2018-12-29 ENCOUNTER — Other Ambulatory Visit: Payer: Self-pay | Admitting: Cardiovascular Disease

## 2018-12-29 DIAGNOSIS — R0989 Other specified symptoms and signs involving the circulatory and respiratory systems: Secondary | ICD-10-CM

## 2018-12-30 ENCOUNTER — Telehealth: Payer: Self-pay

## 2018-12-30 ENCOUNTER — Other Ambulatory Visit: Payer: Self-pay | Admitting: Cardiovascular Disease

## 2018-12-30 NOTE — Telephone Encounter (Signed)
Left message for patient to call back. Will discuss virtual visit when he calls back.

## 2018-12-31 NOTE — Progress Notes (Signed)
Virtual Visit via Video Note   This visit type was conducted due to national recommendations for restrictions regarding the COVID-19 Pandemic (e.g. social distancing) in an effort to limit this patient's exposure and mitigate transmission in our community.  Due to his co-morbid illnesses, this patient is at least at moderate risk for complications without adequate follow up.  This format is felt to be most appropriate for this patient at this time.  All issues noted in this document were discussed and addressed.  A limited physical exam was performed with this format.  Please refer to the patient's chart for his consent to telehealth for [facility].   Date:  01/07/2019   ID:  [name], DOB 11/24/1927, MRN [id number]  Patient Location: Home Provider Location: Office  PCP:  Helane Rima, MD  Cardiologist:  Johnsie Cancel Electrophysiologist:  None   Evaluation Performed:  Follow-Up Visit  Chief Complaint:  CAD/ Vascular Dx  History of Present Illness:    85 y.o.  f/u for CAD and PVD. History of DES to RCA July 1696 complicated by right radial AV fistula. Returned 02/27/17 with DES To Ramus. Lifelong DAT recommended History of AV block on beta blockers. Post left CEA with duplex 12/06/16 plaque no restenosis or new denovo disease. CRF;s include HLD and HTN. Compliant with meds EF has been normal   He has a residual fistula in right radial artery VVS knows about but does not need correction no steal  He drinks 3-4 glasses of white wine daily. He walks about 30 minutes daily.  His daughter and grandson are with him at home   The patient does not have symptoms concerning for COVID-19 infection (fever, chills, cough, or new shortness of breath).    Past Medical History:  Diagnosis Date  . AV BLOCK, 1ST DEGREE   . Carotid artery disease ([place])    a. s/p Left ECA followed by Dr. Donnetta Hutching  . Coronary artery disease   . Coronary artery disease involving coronary bypass graft of native  heart with angina pectoris ([place])    a. LHC 12/2015  3vd Left Cx 100% with colaterals, and distally LAD occluded, DES to prox-mid RCA  b. 02/2017: repeat cath with patent RCA stent and stable CAD; 02/27/2017: Rota-PCI to Ost RI  . GERD (gastroesophageal reflux disease)   . HLD (hyperlipidemia)   . HTN (hypertension)   . NSVT (nonsustained ventricular tachycardia) ([place])    during stress test 12/2015  . Presence of drug coated stent in RCA & Ramus Intermedius. 02/28/2017   12/2015: PCI p-mRCA - Stent Resolute Integ 3.5x34 02/2017: Rota-PCI o-mRamus - overlapping DES STENT SYNERGY DES 3X32  & 3x24 (tapered post-dilation)  . Stroke [facility])    Past Surgical History:  Procedure Laterality Date  . CARDIAC CATHETERIZATION     x2  . CARDIAC CATHETERIZATION N/A 01/11/2016   Procedure: Left Heart Cath and Coronary Angiography;  Surgeon: Wellington Hampshire, MD;  Location: [place] [facility];  Service: Cardiovascular;  Laterality: N/A;  . CAROTID ENDARTERECTOMY  11/28/2006   left  . CORONARY ATHERECTOMY N/A 02/27/2017   Procedure: Coronary Atherectomy;  Surgeon: Troy Sine, MD;  Location: [place] [facility];  Service: Cardiovascular;  Laterality: N/A;  . HERNIA REPAIR    . LEFT HEART CATH AND CORONARY ANGIOGRAPHY N/A 02/21/2017   Procedure: Left Heart Cath and Coronary Angiography;  Surgeon: Lorretta Harp, MD;  Location: [place];  Service: Cardiovascular;  Laterality: N/A;  . TONSILLECTOMY  Current Meds  Medication Sig  . acetaminophen (TYLENOL) 500 MG tablet Take 1 tablet (500 mg total) by mouth every 6 (six) hours as needed for moderate pain (wrist).  Marland Kitchen amLODipine (NORVASC) 10 MG tablet TAKE 1 TABLET DAILY  . aspirin EC 81 MG EC tablet Take 1 tablet (81 mg total) by mouth daily.  Marland Kitchen b complex vitamins tablet Take 1 tablet by mouth daily.  . Calcium Carbonate (CALCIUM 500 PO) Take 1 tablet by mouth daily.  . Cholecalciferol (VITAMIN D-3) 1000 units CAPS Take 1 capsule by mouth daily.  .  clopidogrel (PLAVIX) 75 MG tablet Take 1 tablet (75 mg total) by mouth daily with breakfast. Please keep upcoming appt in April with Dr. Johnsie Cancel for future refills. Thank you  . Glucosamine 500 MG CAPS Take 1 capsule by mouth daily.   . isosorbide mononitrate (IMDUR) 60 MG 24 hr tablet TAKE ONE AND ONE-HALF TABLETS DAILY  . lisinopril (ZESTRIL) 40 MG tablet TAKE 1 TABLET DAILY  . Multiple Vitamin (MULTIVITAMIN) capsule Take 1 capsule by mouth daily.  . nitroGLYCERIN (NITROSTAT) 0.4 MG SL tablet Place 0.4 mg under the tongue every 5 (five) minutes as needed for chest pain (not to exceed 3 doses.).  Marland Kitchen Omega-3 Fatty Acids (FISH OIL) 1000 MG CAPS Take 1 capsule by mouth daily.  Marland Kitchen OVER THE COUNTER MEDICATION 600 mg as needed.  . ranolazine (RANEXA) 1000 MG SR tablet TAKE 1 TABLET TWICE A DAY  . simvastatin (ZOCOR) 20 MG tablet TAKE 1 TABLET DAILY  . vitamin C (ASCORBIC ACID) 500 MG tablet Take 500 mg by mouth daily.       Allergies:   Beta adrenergic blockers   Social History   Tobacco Use  . Smoking status: Never Smoker  . Smokeless tobacco: Never Used  Substance Use Topics  . Alcohol use: Yes    Alcohol/week: 15.0 standard drinks    Types: 15 Glasses of wine per week  . Drug use: No     Family Hx: The patient's family history includes Cancer (age of onset: 12) in his sister; Diabetes in his mother and sister; Heart disease in his sister.  ROS:   Please see the history of present illness.     All other systems reviewed and are negative.   Prior CV studies:   The following studies were reviewed today:  Carotid Duplex:  01/23/18  40-59% RICA left CEA patent plaque no stenosis   LHC: 02/27/17  Conclusion     Prox RCA to Mid RCA lesion, 0 %stenosed.  Mid RCA lesion, 30 %stenosed.  Dist RCA lesion, 70 %stenosed.  Ost Cx to Prox Cx lesion, 100 %stenosed.  Dist LAD lesion, 100 %stenosed.  Mid LAD lesion, 50 %stenosed.  Ost LAD to Prox LAD lesion, 60 %stenosed.   Ramus-2 lesion, 70 %stenosed.  A STENT SYNERGY DES 3X32 drug eluting stent was successfully placed.  Ramus-1 lesion, 80 %stenosed.  Post intervention, there is a 0% residual stenosis.  A STENT SYNERGY DES 3X24 drug eluting stent was successfully placed, and overlaps previously placed stent.  Ost Ramus lesion, 95 %stenosed.  Post intervention, there is a 0% residual stenosis.  Successful complex PCI to the ramus intermediate vessel which had a 95% ostial stenosis followed by diffuse 80% proximal stenosis and 70% mid stenosis. The three lesion were treated with high-speed rotational atherectomy with a 1.5 mm and a 1.75 mm burr, cutting balloon atherotomy with a 3.010 mm Wolverine balloon, and tandem DES stenting with a  3.032 mm and 3.024 mm Synergy DES stents postdilated to 3.28 m millimeters ostially to 3.21 mm distally with the entire segments being reduced to 0%.   RECOMMENDATION: Lifelong DAPT. Continue aggressive medical therapy for the patient's severe concomitant CAD.     Labs/Other Tests and Data Reviewed:    EKG:   SR rate 60 PR 368 nonspecific ST changes   Recent Labs: No results found for requested labs within last 8760 hours.   Recent Lipid Panel Lab Results  Component Value Date/Time   CHOL 109 02/21/2017 02:04 AM   TRIG 52 02/21/2017 02:04 AM   HDL 46 02/21/2017 02:04 AM   CHOLHDL 2.4 02/21/2017 02:04 AM   LDLCALC 53 02/21/2017 02:04 AM    Wt Readings from Last 3 Encounters:  01/07/19 72.6 kg  01/23/18 75.3 kg  11/19/17 78.2 kg     Objective:    Vital Signs:  BP 110/82   Pulse (!) 56   Ht 5\' 6"  (1.676 m)   Wt 72.6 kg   BMI 25.82 kg/m    Telephone visit no exam   ASSESSMENT & PLAN:    CAD status post PCI with high-speed rotational atherectomy in overlapping drug-eluting stents to the ramus 02/27/17. Currently without angina. Continue Imdur, Ranexa, Plavix and aspirin Has patent stent to mid RCA. Residual distal RCA and occluded distal LAD    HTN:   Well controlled.  Continue current medications and low sodium Dash type diet.  Avoid beta blocker history of AV block  HLD  continue simvastatin.labs with primary   Carotid:  F/u VVS post left CEA 0258 complicated by stroke with residual right hand tremor  duplex 11/2016 Duplex 01/23/18 40-59% RICA   Fistula:  Right radial post cath no steal no symptoms but somewhat impressive on exam   SSS:  F/u ECG for long PR no beta blocker   COVID-19 Education: The signs and symptoms of COVID-19 were discussed with the patient and how to seek care for testing (follow up with PCP or arrange E-visit).  The importance of social distancing was discussed today.  Time:   Today, I have spent 30 minutes with the patient with telehealth technology discussing the above problems.     Medication Adjustments/Labs and Tests Ordered: Current medicines are reviewed at length with the patient today.  Concerns regarding medicines are outlined above.   Tests Ordered: No orders of the defined types were placed in this encounter.   Medication Changes: No orders of the defined types were placed in this encounter.   Disposition:  Follow up with ECG in 6 months  Signed, Jenkins Rouge, MD  01/07/2019 2:51 PM    [facility]

## 2018-12-31 NOTE — Telephone Encounter (Signed)
Follow up     Called patient to convert 01-07-19 office visit to virtual visit.  Patient has a landline phone only.  He gave consent for a telephone visit on 01-07-19.   YOUR CARDIOLOGY TEAM HAS ARRANGED FOR AN E-VISIT FOR YOUR APPOINTMENT - PLEASE REVIEW IMPORTANT INFORMATION BELOW SEVERAL DAYS PRIOR TO YOUR APPOINTMENT  Due to the recent COVID-19 pandemic, we are transitioning in-person office visits to tele-medicine visits in an effort to decrease unnecessary exposure to our patients, their families, and staff. These visits are billed to your insurance just like a normal visit is. We also encourage you to sign up for MyChart if you have not already done so. You will need a smartphone if possible. For patients that do not have this, we can still complete the visit using a regular telephone but do prefer a smartphone to enable video when possible. You may have a family member that lives with you that can help. If possible, we also ask that you have a blood pressure cuff and scale at home to measure your blood pressure, heart rate and weight prior to your scheduled appointment. Patients with clinical needs that need an in-person evaluation and testing will still be able to come to the office if absolutely necessary. If you have any questions, feel free to call our office.     YOUR PROVIDER WILL BE USING THE FOLLOWING PLATFORM TO COMPLETE YOUR VISIT: Phone   IF USING MYCHART - How to Download the MyChart App to Your SmartPhone   - If Apple, go to [employer] and type in MyChart in the search bar and download the app. If Android, ask patient to go to [employer] and type in [place] in the search bar and download the app. The app is free but as with any other app downloads, your phone may require you to verify saved payment information or Apple/Android password.  - You will need to then log into the app with your MyChart username and password, and select [place] as your healthcare provider to  link the account.  - When it is time for your visit, go to the MyChart app, find appointments, and click Begin Video Visit. Be sure to Select Allow for your device to access the Microphone and Camera for your visit. You will then be connected, and your provider will be with you shortly.  **If you have any issues connecting or need assistance, please contact MyChart service desk [phone])**  **If using a computer, in order to ensure the best quality for your visit, you will need to use either of the following Internet Browsers: Insurance underwriter or Microsoft Edge**   IF USING DOXIMITY or [email or web address] - The staff will give you instructions on receiving your link to join the meeting the day of your visit.      2-3 DAYS BEFORE YOUR APPOINTMENT  You will receive a telephone call from one of our [place] team members - your caller ID may say "Unknown caller." If this is a video visit, we will walk you through how to get the video launched on your phone. We will remind you check your blood pressure, heart rate and weight prior to your scheduled appointment. If you have an Apple Watch or Kardia, please upload any pertinent ECG strips the day before or morning of your appointment to [facility]. Our staff will also make sure you have reviewed the consent and agree to move forward with your scheduled tele-health visit.  THE DAY OF YOUR APPOINTMENT  Approximately 15 minutes prior to your scheduled appointment, you will receive a telephone call from one of [facility] team - your caller ID may say "Unknown caller."  Our staff will confirm medications, vital signs for the day and any symptoms you may be experiencing. Please have this information available prior to the time of visit start. It may also be helpful for you to have a pad of paper and pen handy for any instructions given during your visit. They will also walk you through joining the smartphone meeting if this is a video visit.    CONSENT  FOR TELE-HEALTH VISIT - PLEASE REVIEW  I hereby voluntarily request, consent and authorize [facility] and its employed or contracted physicians, physician assistants, nurse practitioners or other licensed health care professionals (the Practitioner), to provide me with telemedicine health care services (the Services") as deemed necessary by the treating Practitioner. I acknowledge and consent to receive the Services by the Practitioner via telemedicine. I understand that the telemedicine visit will involve communicating with the Practitioner through live audiovisual communication technology and the disclosure of certain medical information by electronic transmission. I acknowledge that I have been given the opportunity to request an in-person assessment or other available alternative prior to the telemedicine visit and am voluntarily participating in the telemedicine visit.  I understand that I have the right to withhold or withdraw my consent to the use of telemedicine in the course of my care at any time, without affecting my right to future care or treatment, and that the Practitioner or I may terminate the telemedicine visit at any time. I understand that I have the right to inspect all information obtained and/or recorded in the course of the telemedicine visit and may receive copies of available information for a reasonable fee.  I understand that some of the potential risks of receiving the Services via telemedicine include:   Delay or interruption in medical evaluation due to technological equipment failure or disruption;  Information transmitted may not be sufficient (e.g. poor resolution of images) to allow for appropriate medical decision making by the Practitioner; and/or   In rare instances, security protocols could fail, causing a breach of personal health information.  Furthermore, I acknowledge that it is my responsibility to provide information about my medical history, conditions  and care that is complete and accurate to the best of my ability. I acknowledge that Practitioner's advice, recommendations, and/or decision may be based on factors not within their control, such as incomplete or inaccurate data provided by me or distortions of diagnostic images or specimens that may result from electronic transmissions. I understand that the practice of medicine is not an exact science and that Practitioner makes no warranties or guarantees regarding treatment outcomes. I acknowledge that I will receive a copy of this consent concurrently upon execution via email to the email address I last provided but may also request a printed copy by calling the office of [facility].    I understand that my insurance will be billed for this visit.   I have read or had this consent read to me.  I understand the contents of this consent, which adequately explains the benefits and risks of the Services being provided via telemedicine.   I have been provided ample opportunity to ask questions regarding this consent and the Services and have had my questions answered to my satisfaction.  I give my informed consent for the services to be provided through the  use of telemedicine in my medical care  By participating in this telemedicine visit I agree to the above.

## 2019-01-07 ENCOUNTER — Encounter: Payer: Self-pay | Admitting: Cardiovascular Disease

## 2019-01-07 ENCOUNTER — Other Ambulatory Visit: Payer: Self-pay

## 2019-01-07 ENCOUNTER — Telehealth (INDEPENDENT_AMBULATORY_CARE_PROVIDER_SITE_OTHER): Payer: Medicare Other | Admitting: Cardiovascular Disease

## 2019-01-07 VITALS — BP 110/82 | HR 56 | Ht 66.0 in | Wt 160.0 lb

## 2019-01-07 DIAGNOSIS — I2510 Atherosclerotic heart disease of native coronary artery without angina pectoris: Secondary | ICD-10-CM

## 2019-01-07 NOTE — Patient Instructions (Addendum)
Medication Instructions:   If you need a refill on your cardiac medications before your next appointment, please call your pharmacy.   Lab work:  If you have labs (blood work) drawn today and your tests are completely normal, you will receive your results only by: . MyChart Message (if you have MyChart) OR . A paper copy in the mail If you have any lab test that is abnormal or we need to change your treatment, we will call you to review the results.  Testing/Procedures: None ordered today.  Follow-Up: At [facility], you and your health needs are our priority.  As part of our continuing mission to provide you with exceptional heart care, we have created designated Provider Care Teams.  These Care Teams include your primary Cardiologist (physician) and Advanced Practice Providers (APPs -  Physician Assistants and Nurse Practitioners) who all work together to provide you with the care you need, when you need it. You will need a follow up appointment in 3 months.    You may see Dr. Nishan or one of the following Advanced Practice Providers on your designated Care Team:   Lori Gerhardt, NP Laura Ingold, NP . Jill McDaniel, NP   

## 2019-03-09 ENCOUNTER — Other Ambulatory Visit: Payer: Self-pay | Admitting: Cardiovascular Disease

## 2019-03-30 ENCOUNTER — Other Ambulatory Visit: Payer: Self-pay | Admitting: Cardiovascular Disease

## 2019-04-08 NOTE — Progress Notes (Signed)
Date:  04/08/2019   ID:  [name], DOB [birth year], MRN [id number]  Provider Location: Office  PCP:  Helane Rima, MD  Cardiologist:  Johnsie Cancel Electrophysiologist:  None   Evaluation Performed:  Follow-Up Visit  Chief Complaint:  CAD/ Vascular Dx  History of Present Illness:    85 y.o.  f/u for CAD and PVD. History of DES to RCA July 8242 complicated by right radial AV fistula. Returned 02/27/17 with DES To Ramus. Lifelong DAT recommended History of AV block on beta blockers. Post left CEA with duplex 12/06/16 plaque no restenosis or new denovo disease. CRF;s include HLD and HTN. Compliant with meds EF has been normal   He has a residual fistula in right radial artery VVS knows about but does not need correction no steal  He drinks 3-4 glasses of white wine daily. He walks about 30 minutes daily.  His daughter and grandson are with him at home Wife of 72 years Passed 6 weeks ago   The patient does not have symptoms concerning for COVID-19 infection (fever, chills, cough, or new shortness of breath).    Past Medical History:  Diagnosis Date  . AV BLOCK, 1ST DEGREE   . Carotid artery disease ([place])    a. s/p Left ECA followed by Dr. Donnetta Hutching  . Coronary artery disease   . Coronary artery disease involving coronary bypass graft of native heart with angina pectoris ([place])    a. LHC 12/2015  3vd Left Cx 100% with colaterals, and distally LAD occluded, DES to prox-mid RCA  b. 02/2017: repeat cath with patent RCA stent and stable CAD; 02/27/2017: Rota-PCI to Ost RI  . GERD (gastroesophageal reflux disease)   . HLD (hyperlipidemia)   . HTN (hypertension)   . NSVT (nonsustained ventricular tachycardia) ([place])    during stress test 12/2015  . Presence of drug coated stent in RCA & Ramus Intermedius. 02/28/2017   12/2015: PCI p-mRCA - Stent Resolute Integ 3.5x34 02/2017: Rota-PCI o-mRamus - overlapping DES STENT SYNERGY DES 3X32  & 3x24 (tapered post-dilation)  . Stroke [facility])    Past  Surgical History:  Procedure Laterality Date  . CARDIAC CATHETERIZATION     x2  . CARDIAC CATHETERIZATION N/A 01/11/2016   Procedure: Left Heart Cath and Coronary Angiography;  Surgeon: Wellington Hampshire, MD;  Location: [place] [facility];  Service: Cardiovascular;  Laterality: N/A;  . CAROTID ENDARTERECTOMY  11/28/2006   left  . CORONARY ATHERECTOMY N/A 02/27/2017   Procedure: Coronary Atherectomy;  Surgeon: Troy Sine, MD;  Location: [place] [facility];  Service: Cardiovascular;  Laterality: N/A;  . HERNIA REPAIR    . LEFT HEART CATH AND CORONARY ANGIOGRAPHY N/A 02/21/2017   Procedure: Left Heart Cath and Coronary Angiography;  Surgeon: Lorretta Harp, MD;  Location: [place] [facility];  Service: Cardiovascular;  Laterality: N/A;  . TONSILLECTOMY       No outpatient medications have been marked as taking for the 04/21/19 encounter (Appointment) with Josue Hector, MD.     Allergies:   Beta adrenergic blockers   Social History   Tobacco Use  . Smoking status: Never Smoker  . Smokeless tobacco: Never Used  Substance Use Topics  . Alcohol use: Yes    Alcohol/week: 15.0 standard drinks    Types: 15 Glasses of wine per week  . Drug use: No     Family Hx: The patient's family history includes Cancer (age of onset: 56) in his sister; Diabetes in his mother  and sister; Heart disease in his sister.  ROS:   Please see the history of present illness.     All other systems reviewed and are negative.   Prior CV studies:   The following studies were reviewed today:  Carotid Duplex:  01/23/18  40-59% RICA left CEA patent plaque no stenosis   LHC: 02/27/17  Conclusion     Prox RCA to Mid RCA lesion, 0 %stenosed.  Mid RCA lesion, 30 %stenosed.  Dist RCA lesion, 70 %stenosed.  Ost Cx to Prox Cx lesion, 100 %stenosed.  Dist LAD lesion, 100 %stenosed.  Mid LAD lesion, 50 %stenosed.  Ost LAD to Prox LAD lesion, 60 %stenosed.  Ramus-2 lesion, 70 %stenosed.  A STENT  SYNERGY DES 3X32 drug eluting stent was successfully placed.  Ramus-1 lesion, 80 %stenosed.  Post intervention, there is a 0% residual stenosis.  A STENT SYNERGY DES 3X24 drug eluting stent was successfully placed, and overlaps previously placed stent.  Ost Ramus lesion, 95 %stenosed.  Post intervention, there is a 0% residual stenosis.  Successful complex PCI to the ramus intermediate vessel which had a 95% ostial stenosis followed by diffuse 80% proximal stenosis and 70% mid stenosis. The three lesion were treated with high-speed rotational atherectomy with a 1.5 mm and a 1.75 mm burr, cutting balloon atherotomy with a 3.010 mm Wolverine balloon, and tandem DES stenting with a 3.032 mm and 3.024 mm Synergy DES stents postdilated to 3.28 m millimeters ostially to 3.21 mm distally with the entire segments being reduced to 0%.   RECOMMENDATION: Lifelong DAPT. Continue aggressive medical therapy for the patient's severe concomitant CAD.     Labs/Other Tests and Data Reviewed:    EKG:   SR rate 52 PR 386 otherwise normal   Recent Labs: No results found for requested labs within last 8760 hours.   Recent Lipid Panel Lab Results  Component Value Date/Time   CHOL 109 02/21/2017 02:04 AM   TRIG 52 02/21/2017 02:04 AM   HDL 46 02/21/2017 02:04 AM   CHOLHDL 2.4 02/21/2017 02:04 AM   LDLCALC 53 02/21/2017 02:04 AM    Wt Readings from Last 3 Encounters:  01/07/19 160 lb (72.6 kg)  01/23/18 166 lb (75.3 kg)  11/19/17 172 lb 8 oz (78.2 kg)     Objective:    Vital Signs:  There were no vitals taken for this visit.   Affect appropriate Elderly white male  HEENT: left CEA residual bruit  Neck supple with no adenopathy JVP normal no bruits no thyromegaly Lungs clear with no wheezing and good diaphragmatic motion Heart:  S1/S2 no murmur, no rub, gallop or click PMI normal Abdomen: benighn, BS positve, no tenderness, no AAA no bruit.  No HSM or HJR Distal pulses intact  right radial fistula  No edema Neuro non-focal Skin warm and dry No muscular weakness   ASSESSMENT & PLAN:    CAD status post PCI with high-speed rotational atherectomy in overlapping drug-eluting stents to the ramus 02/27/17. Currently without angina. Continue Imdur, Ranexa, Plavix and aspirin Has patent stent to mid RCA. Residual distal RCA and occluded distal LAD   HTN:   Well controlled.  Continue current medications and low sodium Dash type diet.  Avoid beta blocker history of AV block  HLD  continue simvastatin.labs with primary   Carotid:  F/u VVS post left CEA 8938 complicated by stroke with residual right hand tremor   Duplex 01/23/18 40-59% RICA will order f/u study   Fistula:  Right  radial post cath no steal no symptoms but somewhat impressive on exam   SSS:  F/u ECG for long PR no beta blocker   COVID-19 Education: The signs and symptoms of COVID-19 were discussed with the patient and how to seek care for testing (follow up with PCP or arrange E-visit).  The importance of social distancing was discussed today.  Time:   Today, I have spent 30 minutes with the patient    Medication Adjustments/Labs and Tests Ordered: Current medicines are reviewed at length with the patient today.  Concerns regarding medicines are outlined above.   Tests Ordered: No orders of the defined types were placed in this encounter.   Medication Changes: No orders of the defined types were placed in this encounter.   Disposition:  Follow up with ECG in 6 months  Signed, Jenkins Rouge, MD  04/08/2019 4:14 PM    [facility]

## 2019-04-14 ENCOUNTER — Other Ambulatory Visit: Payer: Self-pay | Admitting: Cardiovascular Disease

## 2019-04-14 MED ORDER — NITROGLYCERIN 0.4 MG SL SUBL: 0.4000 mg | SUBLINGUAL_TABLET | SUBLINGUAL | 5 refills | Status: DC | PRN

## 2019-04-14 NOTE — Telephone Encounter (Signed)
Pt's medication was sent to pt's pharmacy as requested. Confirmation received.  °

## 2019-04-14 NOTE — Telephone Encounter (Signed)
°*  STAT* If patient is at the pharmacy, call can be transferred to refill team.   1. Which medications need to be refilled? (please list name of each medication and dose if known)  Nitroglycerin   2. Which pharmacy/location (including street and city if local pharmacy) is medication to be sent to? [facility] on [address], [place]**  3. Do they need a 30 day or 90 day supply? 1 bottle

## 2019-04-21 ENCOUNTER — Encounter: Payer: Self-pay | Admitting: Cardiovascular Disease

## 2019-04-21 ENCOUNTER — Ambulatory Visit (INDEPENDENT_AMBULATORY_CARE_PROVIDER_SITE_OTHER): Payer: Medicare Other | Admitting: Cardiovascular Disease

## 2019-04-21 ENCOUNTER — Other Ambulatory Visit: Payer: Self-pay

## 2019-04-21 VITALS — BP 122/50 | HR 52 | Ht 66.0 in | Wt 161.0 lb

## 2019-04-21 DIAGNOSIS — I441 Atrioventricular block, second degree: Secondary | ICD-10-CM | POA: Diagnosis not present

## 2019-04-21 DIAGNOSIS — I10 Essential (primary) hypertension: Secondary | ICD-10-CM

## 2019-04-21 DIAGNOSIS — I25119 Atherosclerotic heart disease of native coronary artery with unspecified angina pectoris: Secondary | ICD-10-CM

## 2019-04-21 NOTE — Patient Instructions (Addendum)
Your physician recommends that you continue on your current medications as directed. Please refer to the Current Medication list given to you today.  Your physician wants you to follow-up in: YEAR WITH DR NISHAN You will receive a reminder letter in the mail two months in advance. If you don't receive a letter, please call our office to schedule the follow-up appointment.  

## 2019-04-29 ENCOUNTER — Other Ambulatory Visit: Payer: Self-pay

## 2019-04-29 DIAGNOSIS — R0989 Other specified symptoms and signs involving the circulatory and respiratory systems: Secondary | ICD-10-CM

## 2019-04-29 MED ORDER — CLOPIDOGREL BISULFATE 75 MG PO TABS: 75.0000 mg | ORAL_TABLET | Freq: Every day | ORAL | 3 refills | Status: DC

## 2019-04-30 ENCOUNTER — Telehealth: Payer: Self-pay | Admitting: Cardiovascular Disease

## 2019-04-30 DIAGNOSIS — R0989 Other specified symptoms and signs involving the circulatory and respiratory systems: Secondary | ICD-10-CM

## 2019-04-30 MED ORDER — CLOPIDOGREL BISULFATE 75 MG PO TABS: 75.0000 mg | ORAL_TABLET | Freq: Every day | ORAL | 0 refills | Status: DC

## 2019-04-30 NOTE — Telephone Encounter (Signed)
Follow Up:   Pt wants to know why his Clopidogrel was denied? He wants to know if he is still supposed to be taking it?Marland Kitchen

## 2019-04-30 NOTE — Telephone Encounter (Signed)
Pt's medication was sent to [facility] mail order on 04/29/19 with year supply, but pt stated that he was out of his medication. I advised the pt that I can send in a 10 day supply until his mail order arrives. Pt stated that he would like for that to be done. Medication sent to pt's local pharmacy. Confirmation received.

## 2019-06-10 ENCOUNTER — Telehealth (HOSPITAL_COMMUNITY): Payer: Self-pay | Admitting: *Deleted

## 2019-06-10 ENCOUNTER — Other Ambulatory Visit: Payer: Self-pay

## 2019-06-10 DIAGNOSIS — I6523 Occlusion and stenosis of bilateral carotid arteries: Secondary | ICD-10-CM

## 2019-06-10 NOTE — Telephone Encounter (Signed)

## 2019-06-11 ENCOUNTER — Ambulatory Visit (HOSPITAL_COMMUNITY)
Admission: RE | Admit: 2019-06-11 | Discharge: 2019-06-11 | Disposition: A | Discharge status: Home / Self Care | Payer: Medicare Other | Source: Ambulatory Visit | Attending: Family | Admitting: Family

## 2019-06-11 ENCOUNTER — Ambulatory Visit (INDEPENDENT_AMBULATORY_CARE_PROVIDER_SITE_OTHER): Payer: Medicare Other | Admitting: Family

## 2019-06-11 ENCOUNTER — Encounter: Payer: Self-pay | Admitting: Family

## 2019-06-11 ENCOUNTER — Other Ambulatory Visit: Payer: Self-pay

## 2019-06-11 VITALS — BP 137/61 | HR 54 | Temp 97.3°F | Resp 16 | Ht 66.0 in | Wt 156.0 lb

## 2019-06-11 DIAGNOSIS — I6523 Occlusion and stenosis of bilateral carotid arteries: Secondary | ICD-10-CM

## 2019-06-11 DIAGNOSIS — Z9889 Other specified postprocedural states: Secondary | ICD-10-CM | POA: Diagnosis not present

## 2019-06-11 NOTE — Patient Instructions (Signed)

## 2019-06-11 NOTE — Progress Notes (Signed)
Chief Complaint: Follow up Extracranial Carotid Artery Stenosis   History of Present Illness  [name] is a 85 y.o. male who is status post left CEA in April 2008 by Dr. Donnetta Hutching. He returns today for follow up. He was hospitalized at [facility] in 2008, prior to the CEA, for right hand shaking, he was told that he had a mild stroke. The patient denies any history of other TIA or stroke symptoms other than the right hand shaking, specifically he denies a history of amaurosis fugax or monocular blindness,  unilateral facial drooping, hemiplegia, or receptive or expressive aphasia.  He has had no subsequent stroke or TIA.  He had cardiac stent placed on5/2017, DESto prox-mid RCA;3vd Left Cx 100% hascollaterals, and distally LADwasoccluded.  He denies having any symptoms prior to the stent placed, denies feeling any different after stent placed.Suspicion for coronary artery stenosiswas found on routine cardiac evaluation stress test.  He also has a hx of 1st degree AVB.  He denies any dyspnea or chest pain.  He drinks 3-4 glasses of white wine daily. He walks about 30 minutes daily. He denies claudication type symptoms with walking, denies non-healing wounds.  Apparently, per his description, he developed an arteriovenous fistula at his right wrist after cardiac cath using right radial artery access.   Pt states the swelling in his lower legs resolves by morning, he has been using knee high compression hose with noticeable benefit.    Diabetic: No Tobacco use: non-smoker, he was exposed to his wife's second hand smoke for 50 years. His wife died in Apr 14, 2019. Pt meds include: Statin : Yes ASA: Yes Other anticoagulants/antiplatelets:Plavix added after cardiac stent placed in May 2017   Past Medical History:  Diagnosis Date  . AV BLOCK, 1ST DEGREE   . Carotid artery disease ([place])    a. s/p Left ECA followed by Dr. Donnetta Hutching  . Coronary artery disease   .  Coronary artery disease involving coronary bypass graft of native heart with angina pectoris ([place])    a. LHC 12/2015  3vd Left Cx 100% with colaterals, and distally LAD occluded, DES to prox-mid RCA  b. April 13, 2017: repeat cath with patent RCA stent and stable CAD; 02/27/2017: Rota-PCI to Ost RI  . GERD (gastroesophageal reflux disease)   . HLD (hyperlipidemia)   . HTN (hypertension)   . NSVT (nonsustained ventricular tachycardia) ([place])    during stress test 12/2015  . Presence of drug coated stent in RCA & Ramus Intermedius. 02/28/2017   12/2015: PCI p-mRCA - Stent Resolute Integ 3.5x34 13-Apr-2017: Rota-PCI o-mRamus - overlapping DES STENT SYNERGY DES 3X32  & 3x24 (tapered post-dilation)  . Stroke [facility])     Social History Social History   Tobacco Use  . Smoking status: Never Smoker  . Smokeless tobacco: Never Used  Substance Use Topics  . Alcohol use: Yes    Alcohol/week: 15.0 standard drinks    Types: 15 Glasses of wine per week  . Drug use: No    Family History Family History  Problem Relation Age of Onset  . Diabetes Mother   . Diabetes Sister   . Cancer Sister 48       colon  . Heart disease Sister     Surgical History Past Surgical History:  Procedure Laterality Date  . CARDIAC CATHETERIZATION     x2  . CARDIAC CATHETERIZATION N/A 01/11/2016   Procedure: Left Heart Cath and Coronary Angiography;  Surgeon: Wellington Hampshire, MD;  Location: [place]  [facility];  Service: Cardiovascular;  Laterality: N/A;  . CAROTID ENDARTERECTOMY  11/28/2006   left  . CORONARY ATHERECTOMY N/A 02/27/2017   Procedure: Coronary Atherectomy;  Surgeon: Troy Sine, MD;  Location: [facility] [place] [facility];  Service: Cardiovascular;  Laterality: N/A;  . HERNIA REPAIR    . LEFT HEART CATH AND CORONARY ANGIOGRAPHY N/A 02/21/2017   Procedure: Left Heart Cath and Coronary Angiography;  Surgeon: Lorretta Harp, MD;  Location: [place] [facility];  Service: Cardiovascular;  Laterality: N/A;  . TONSILLECTOMY       Allergies  Allergen Reactions  . Beta Adrenergic Blockers Other (See Comments)    Hx of AV block. Should avoid    Current Outpatient Medications  Medication Sig Dispense Refill  . acetaminophen (TYLENOL) 500 MG tablet Take 1 tablet (500 mg total) by mouth every 6 (six) hours as needed for moderate pain (wrist). 30 tablet 0  . amLODipine (NORVASC) 10 MG tablet TAKE 1 TABLET DAILY 90 tablet 0  . aspirin EC 81 MG EC tablet Take 1 tablet (81 mg total) by mouth daily.    Marland Kitchen b complex vitamins tablet Take 1 tablet by mouth daily.    . Calcium Carbonate (CALCIUM 500 PO) Take 1 tablet by mouth daily.    . Cholecalciferol (VITAMIN D-3) 1000 units CAPS Take 1 capsule by mouth daily.    . clopidogrel (PLAVIX) 75 MG tablet Take 1 tablet (75 mg total) by mouth daily with breakfast. 10 tablet 0  . Glucosamine 500 MG CAPS Take 1 capsule by mouth daily.     . isosorbide mononitrate (IMDUR) 60 MG 24 hr tablet TAKE ONE AND ONE-HALF TABLETS DAILY 135 tablet 0  . lisinopril (ZESTRIL) 40 MG tablet TAKE 1 TABLET DAILY 90 tablet 0  . Multiple Vitamin (MULTIVITAMIN) capsule Take 1 capsule by mouth daily.    . nitroGLYCERIN (NITROSTAT) 0.4 MG SL tablet Place 1 tablet (0.4 mg total) under the tongue every 5 (five) minutes as needed for chest pain (not to exceed 3 doses.). 25 tablet 5  . Omega-3 Fatty Acids (FISH OIL) 1000 MG CAPS Take 1 capsule by mouth daily.    Marland Kitchen OVER THE COUNTER MEDICATION 600 mg as needed.    . ranolazine (RANEXA) 1000 MG SR tablet TAKE 1 TABLET TWICE A DAY 180 tablet 0  . simvastatin (ZOCOR) 20 MG tablet TAKE 1 TABLET DAILY 90 tablet 3  . vitamin C (ASCORBIC ACID) 500 MG tablet Take 500 mg by mouth daily.       No current facility-administered medications for this visit.     Review of Systems : See HPI for pertinent positives and negatives.  Physical Examination  Vitals:   06/11/19 1456  BP: 137/61  Pulse: (!) 54  Resp: 16  Temp: (!) 97.3 F (36.3 C)  TempSrc: Temporal   SpO2: 99%  Weight: 156 lb (70.8 kg)  Height: 5\' 6"  (1.676 m)   Body mass index is 25.18 kg/m.  General: WDWN elderly male in NAD GAIT: slow, steady, occasionally painful left hip Eyes: Pupils are equal and round HENT: No gross abnormalities.  Pulmonary:  Respirations are non-labored, good air movement in all fields, CTAB, no rales, rhonchi, or  wheezes Cardiac: regular rhythm with premature contraction every 3rd contraction, bradycardic, no detected murmur.  VASCULAR EXAM Carotid Bruits Right Left   Positive Negative     Abdominal aortic pulse is not palpable. Radial pulses are 2+ palpable and equal.  Small arteriovenous fistula right wrist with palpable thrill                                                                                                                        LE Pulses Right Left       POPLITEAL  not palpable   not palpable       POSTERIOR TIBIAL   palpable    palpable        DORSALIS PEDIS      ANTERIOR TIBIAL  palpable   palpable     Gastrointestinal: soft, nontender, BS WNL, no r/g, no palpable masses. Musculoskeletal: no muscle atrophy/wasting. M/S 5/5 throughout, extremities without ischemic changes. Trace bilateral pretibial pitting edema, knee high compression hose on.  Skin: No rashes, no ulcers, no cellulitis.   Neurologic:  A&O X 3; appropriate affect, sensation is normal; speech is normal, CN 2-12 intact except has some hearing loss, pain and light touch intact in extremities, motor exam as listed above. Psychiatric: Normal thought content, mood appropriate to clinical situation.     DATA Carotid Duplex (06-11-19): Right ICA: 40-59% stenosis Left ICA: CEA site with 1-39% stenosis Bilateral vertebral artery flow is antegrade.  Bilateral subclavian artery waveforms are normal.  No significant change compared to the exam on 01-23-18.    Assessment: [name] is a 85 y.o. male who is status post left CEA in April 2008. He had a  preoperative stoke; residual neurological deficit is tremor of the right hand, no subsequent stroke or TIA.  Today's carotid duplex shows 40-59% right ICA stenosis and 1-39% left ICA stenosis, no change compared to the exam on 01-23-18.    Fortunately he does not have DM and has never used tobacco, but he was exposed for 50 years to his wife's smoking in their house. He takes a daily 81 mg ASA, Plavix, and a statin.   Mild dependent edema in lower legs, he has been wearing knee high compression hose which he states helps.    Plan: Follow-up in 1 year with Carotid Duplex scan.   I discussed in depth with the patient the nature of atherosclerosis, and emphasized the importance of maximal medical management including strict control of blood pressure, blood glucose, and lipid levels, obtaining regular exercise, and continued cessation of smoking.  The patient is aware that without maximal medical management the underlying atherosclerotic disease process will progress, limiting the benefit of any interventions. The patient was given information about stroke prevention and what symptoms should prompt the patient to seek immediate medical care. Thank you for allowing [place] to participate in this patient's care.  Clemon Chambers, RN, MSN, FNP-C [facility] Office: [phone]  Clinic Physician: Laqueta Due  06/11/19 3:18 PM

## 2019-06-28 ENCOUNTER — Other Ambulatory Visit: Payer: Self-pay | Admitting: Cardiovascular Disease

## 2019-06-29 NOTE — Telephone Encounter (Signed)
Anticoagulation clinic does not refill Ranexa. Thanks

## 2019-09-17 ENCOUNTER — Encounter (HOSPITAL_COMMUNITY): Payer: Self-pay

## 2019-09-17 ENCOUNTER — Emergency Department (HOSPITAL_COMMUNITY): Payer: Medicare Other

## 2019-09-17 ENCOUNTER — Other Ambulatory Visit: Payer: Self-pay

## 2019-09-17 ENCOUNTER — Emergency Department (HOSPITAL_COMMUNITY)
Admission: EM | Admit: 2019-09-17 | Discharge: 2019-09-17 | Disposition: A | Discharge status: Home / Self Care | Payer: Medicare Other | Attending: Emergency Medicine | Admitting: Emergency Medicine

## 2019-09-17 DIAGNOSIS — G451 Carotid artery syndrome (hemispheric): Secondary | ICD-10-CM | POA: Diagnosis not present

## 2019-09-17 DIAGNOSIS — Y999 Unspecified external cause status: Secondary | ICD-10-CM | POA: Insufficient documentation

## 2019-09-17 DIAGNOSIS — S0121XA Laceration without foreign body of nose, initial encounter: Secondary | ICD-10-CM | POA: Diagnosis present

## 2019-09-17 DIAGNOSIS — I2510 Atherosclerotic heart disease of native coronary artery without angina pectoris: Secondary | ICD-10-CM | POA: Insufficient documentation

## 2019-09-17 DIAGNOSIS — S61412A Laceration without foreign body of left hand, initial encounter: Secondary | ICD-10-CM | POA: Insufficient documentation

## 2019-09-17 DIAGNOSIS — I119 Hypertensive heart disease without heart failure: Secondary | ICD-10-CM | POA: Diagnosis not present

## 2019-09-17 DIAGNOSIS — Y9389 Activity, other specified: Secondary | ICD-10-CM | POA: Insufficient documentation

## 2019-09-17 DIAGNOSIS — W010XXA Fall on same level from slipping, tripping and stumbling without subsequent striking against object, initial encounter: Secondary | ICD-10-CM | POA: Insufficient documentation

## 2019-09-17 DIAGNOSIS — Z7982 Long term (current) use of aspirin: Secondary | ICD-10-CM | POA: Insufficient documentation

## 2019-09-17 DIAGNOSIS — Z8673 Personal history of transient ischemic attack (TIA), and cerebral infarction without residual deficits: Secondary | ICD-10-CM | POA: Diagnosis not present

## 2019-09-17 DIAGNOSIS — Y92099 Unspecified place in other non-institutional residence as the place of occurrence of the external cause: Secondary | ICD-10-CM | POA: Insufficient documentation

## 2019-09-17 DIAGNOSIS — Z79899 Other long term (current) drug therapy: Secondary | ICD-10-CM | POA: Insufficient documentation

## 2019-09-17 DIAGNOSIS — W19XXXA Unspecified fall, initial encounter: Secondary | ICD-10-CM

## 2019-09-17 MED ORDER — LIDOCAINE-EPINEPHRINE (PF) 2 %-1:200000 IJ SOLN
20.0000 mL | Freq: Once | INTRAMUSCULAR | Status: AC
  Administered 2019-09-17: 20 mL
  Filled 2019-09-17: qty 20

## 2019-09-17 NOTE — ED Triage Notes (Addendum)
Pt arrived POV from home ambulatory in ED CC Mechanical Fall. Pt denies head neck  back pain or LOC. Pt is on blood thinners. Bleeding controlled at this time. LAC to left hand, right elbow, and bridge of nose. Pt is A/OX4.    Hx Cardiac Stent placement. Fistula on right wrist due to surgery complication. Pt denies dialysis or other use for fistula. Thrill and bruit present.

## 2019-09-17 NOTE — ED Notes (Signed)
Pt verbalizes understanding of DC instructions. Pt belongings returned and is ambulatory out of ED.  

## 2019-09-17 NOTE — ED Notes (Signed)
Floyd MD at bedside.  

## 2019-09-17 NOTE — ED Notes (Signed)
Suture supply at bedside

## 2019-09-17 NOTE — ED Provider Notes (Signed)
[facility] DEPT Provider Note   CSN: [id number] Arrival date & time: 09/17/19  1713     History Chief Complaint  Patient presents with  . Fall    Blood Thinners    [name] is a 86 y.o. male.  86 yo M with a chief complaint of a fall.  States that this was nonsyncopal.  Tripped over the doorway.  Landed on his pet feeding area.  Patient complaining of a wound to both elbows the bridge of his nose and his left hand.  He denies neck pain chest pain back pain abdominal pain lower extremity pain.  Denies loss of consciousness.  The history is provided by the patient.  Fall This is a new problem. The current episode started 3 to 5 hours ago. The problem occurs rarely. The problem has been resolved. Pertinent negatives include no chest pain, no abdominal pain, no headaches and no shortness of breath. Nothing aggravates the symptoms. Nothing relieves the symptoms. He has tried nothing for the symptoms. The treatment provided no relief.       Past Medical History:  Diagnosis Date  . AV BLOCK, 1ST DEGREE   . Carotid artery disease ([place])    a. s/p Left ECA followed by Dr. Donnetta Hutching  . Coronary artery disease   . Coronary artery disease involving coronary bypass graft of native heart with angina pectoris ([place])    a. LHC 12/2015  3vd Left Cx 100% with colaterals, and distally LAD occluded, DES to prox-mid RCA  b. 02/2017: repeat cath with patent RCA stent and stable CAD; 02/27/2017: Rota-PCI to Ost RI  . GERD (gastroesophageal reflux disease)   . HLD (hyperlipidemia)   . HTN (hypertension)   . NSVT (nonsustained ventricular tachycardia) ([place])    during stress test 12/2015  . Presence of drug coated stent in RCA & Ramus Intermedius. 02/28/2017   12/2015: PCI p-mRCA - Stent Resolute Integ 3.5x34 02/2017: Rota-PCI o-mRamus - overlapping DES STENT SYNERGY DES 3X32  & 3x24 (tapered post-dilation)  . Stroke [facility])     Patient Active Problem List   Diagnosis Date Noted    . Laryngitis 04/17/2017  . Cough 03/29/2017  . Acute bronchitis 03/29/2017  . Acute upper respiratory infection 03/29/2017  . Presence of drug coated stent in RCA & Ramus Intermedius. 02/28/2017  . Gout flare 02/22/2017  . Coronary artery disease involving native coronary artery of native heart with angina pectoris ([place])   . Carotid artery disease ([place])   . HLD (hyperlipidemia)   . Unstable angina ([place]) 02/20/2017  . Atherosclerosis of native coronary artery of native heart   . Essential hypertension 01/06/2009  . Second degree atrioventricular block, Mobitz type I 01/06/2009    Past Surgical History:  Procedure Laterality Date  . CARDIAC CATHETERIZATION     x2  . CARDIAC CATHETERIZATION N/A 01/11/2016   Procedure: Left Heart Cath and Coronary Angiography;  Surgeon: Wellington Hampshire, MD;  Location: [facility];  Service: Cardiovascular;  Laterality: N/A;  . CAROTID ENDARTERECTOMY  11/28/2006   left  . CORONARY ATHERECTOMY N/A 02/27/2017   Procedure: Coronary Atherectomy;  Surgeon: Troy Sine, MD;  Location: [facility];  Service: Cardiovascular;  Laterality: N/A;  . HERNIA REPAIR    . LEFT HEART CATH AND CORONARY ANGIOGRAPHY N/A 02/21/2017   Procedure: Left Heart Cath and Coronary Angiography;  Surgeon: Lorretta Harp, MD;  Location: [facility];  Service: Cardiovascular;  Laterality: N/A;  .  TONSILLECTOMY         Family History  Problem Relation Age of Onset  . Diabetes Mother   . Diabetes Sister   . Cancer Sister 15       colon  . Heart disease Sister     Social History   Tobacco Use  . Smoking status: Never Smoker  . Smokeless tobacco: Never Used  Substance Use Topics  . Alcohol use: Yes    Alcohol/week: 15.0 standard drinks    Types: 15 Glasses of wine per week  . Drug use: No    Home Medications Prior to Admission medications   Medication Sig Start Date End Date Taking? Authorizing Provider  acetaminophen (TYLENOL) 500 MG tablet  Take 1 tablet (500 mg total) by mouth every 6 (six) hours as needed for moderate pain (wrist). 02/28/17  Yes Reino Bellis B, NP  amLODipine (NORVASC) 10 MG tablet TAKE 1 TABLET DAILY Patient taking differently: Take 10 mg by mouth daily.  12/31/18  Yes Josue Hector, MD  aspirin EC 81 MG EC tablet Take 1 tablet (81 mg total) by mouth daily. 01/12/16  Yes Reino Bellis B, NP  b complex vitamins tablet Take 1 tablet by mouth daily.   Yes [provider]  Calcium Carbonate (CALCIUM 500 PO) Take 1 tablet by mouth daily.   Yes [provider]  Cholecalciferol (VITAMIN D-3) 1000 units CAPS Take 1 capsule by mouth daily.   Yes [provider]  clopidogrel (PLAVIX) 75 MG tablet Take 1 tablet (75 mg total) by mouth daily with breakfast. 04/30/19  Yes Josue Hector, MD  Glucosamine 500 MG CAPS Take 1 capsule by mouth daily.    Yes [provider]  isosorbide mononitrate (IMDUR) 60 MG 24 hr tablet TAKE ONE AND ONE-HALF TABLETS DAILY Patient taking differently: Take 90 mg by mouth daily.  12/31/18  Yes Josue Hector, MD  lisinopril (ZESTRIL) 40 MG tablet Take 1 tablet (40 mg total) by mouth daily. 07/03/19  Yes Josue Hector, MD  Multiple Vitamin (MULTIVITAMIN) capsule Take 1 capsule by mouth daily.   Yes [provider]  mupirocin ointment (BACTROBAN) 2 % Apply 1 application topically 3 (three) times daily. Lower left leg 09/11/19  Yes [provider]  nitroGLYCERIN (NITROSTAT) 0.4 MG SL tablet Place 1 tablet (0.4 mg total) under the tongue every 5 (five) minutes as needed for chest pain (not to exceed 3 doses.). 04/14/19  Yes Josue Hector, MD  Omega-3 Fatty Acids (FISH OIL) 1000 MG CAPS Take 1 capsule by mouth daily.   Yes [provider]  ranolazine (RANEXA) 1000 MG SR tablet TAKE 1 TABLET TWICE A DAY Patient taking differently: Take 1,000 mg by mouth 2 (two) times daily.  07/03/19  Yes Josue Hector, MD  simvastatin (ZOCOR) 20 MG  tablet TAKE 1 TABLET DAILY Patient taking differently: Take 20 mg by mouth daily at 6 PM.  03/09/19  Yes Josue Hector, MD  vitamin C (ASCORBIC ACID) 500 MG tablet Take 500 mg by mouth daily.     Yes [provider]    Allergies    Beta adrenergic blockers  Review of Systems   Review of Systems  Constitutional: Negative for chills and fever.  HENT: Negative for congestion and facial swelling.   Eyes: Negative for discharge and visual disturbance.  Respiratory: Negative for shortness of breath.   Cardiovascular: Negative for chest pain and palpitations.  Gastrointestinal: Negative for abdominal pain, diarrhea and  vomiting.  Musculoskeletal: Negative for arthralgias and myalgias.  Skin: Positive for wound. Negative for color change and rash.  Neurological: Negative for tremors, syncope and headaches.  Psychiatric/Behavioral: Negative for confusion and dysphoric mood.    Physical Exam Updated Vital Signs BP (!) 123/54   Pulse (!) 54   Temp (!) 97.5 F (36.4 C)   Resp 16   Ht 5\' 2"  (1.575 m)   Wt 69.9 kg   SpO2 98%   BMI 28.17 kg/m   Physical Exam Vitals and nursing note reviewed.  Constitutional:      Appearance: He is well-developed.  HENT:     Head: Normocephalic.     Comments: Stellate laceration to the nasal bridge.  Bruising underneath the right eye.  Extraocular motion is intact.  No facial nerve palsies.  No midline C-spine tenderness.  Able to rotate his head 45 degrees in either direction without pain. Eyes:     Pupils: Pupils are equal, round, and reactive to light.  Neck:     Vascular: No JVD.  Cardiovascular:     Rate and Rhythm: Normal rate and regular rhythm.     Heart sounds: No murmur. No friction rub. No gallop.   Pulmonary:     Effort: No respiratory distress.     Breath sounds: No wheezing.  Abdominal:     General: There is no distension.     Tenderness: There is no guarding or rebound.  Musculoskeletal:        General: Normal range of  motion.     Cervical back: Normal range of motion and neck supple.     Comments: 12 cm laceration to the dorsal aspect of the left hand.  Full range of motion of the fingers.  Superficial skin avulsion to the left lateral elbow.  Superficial skin tear to the right lateral elbow.  Full range of motion of both upper extremities without pain.  Palpated from head to toe without any other noted areas of pain.  Skin:    Coloration: Skin is not pale.     Findings: No rash.  Neurological:     Mental Status: He is alert and oriented to person, place, and time.  Psychiatric:        Behavior: Behavior normal.     ED Results / Procedures / Treatments   Labs (all labs ordered are listed, but only abnormal results are displayed) Labs Reviewed - No data to display  EKG None  Radiology CT Head Wo Contrast  Result Date: 09/17/2019 CLINICAL DATA:  Fall, nasal trauma EXAM: CT HEAD WITHOUT CONTRAST TECHNIQUE: Contiguous axial images were obtained from the base of the skull through the vertex without intravenous contrast. COMPARISON:  11/19/2006 FINDINGS: Brain: No evidence of acute infarction, hemorrhage, hydrocephalus, extra-axial collection or mass lesion/mass effect. Scattered low-density changes within the periventricular and subcortical white matter compatible with chronic microvascular ischemic change. Moderate diffuse cerebral volume loss. Vascular: Mild atherosclerotic calcifications involving the large vessels of the skull base. No unexpected hyperdense vessel. Skull: Normal. Negative for fracture or focal lesion. Sinuses/Orbits: No acute finding. Other: None. IMPRESSION: 1.  No acute intracranial findings. 2.  Chronic microvascular ischemic change and cerebral volume loss. Electronically Signed   By: Davina Poke D.O.   On: 09/17/2019 19:49   DG Hand Complete Left  Result Date: 09/17/2019 CLINICAL DATA:  Left hand pain status post fall. EXAM: LEFT HAND - COMPLETE 3+ VIEW COMPARISON:  None.  FINDINGS: A chronic appearing deformity is seen involving  the left pisiform bone. Normal first metacarpus. Normal second through fifth metacarpi. Normal phalanges. There is no demonstrated fracture. Mild to moderate severity degenerative changes are seen involving the distal carpal articulations. Moderate to marked severity degenerative changes are seen involving the carpometacarpal [facility]) articulation of the left thumb. Normal metacarpophalangeal (MCP) joint of the left thumb. Normal interphalangeal (IP) joint of the left thumb. Normal second through fifth carpometacarpal (CMC) joints. Normal second through fifth metacarpophalangeal (MCP) joints. Moderate severity degenerative changes are seen involving the proximal interphalangeal (PIP) and distal interphalangeal (DIP) joints of the second through fifth fingers. An ill-defined superficial soft tissue defect is seen along the dorsal aspect of the left hand. IMPRESSION: 1. Chronic and degenerative changes, as described above, without evidence of acute osseous abnormality. Electronically Signed   By: Virgina Norfolk M.D.   On: 09/17/2019 18:31   CT Maxillofacial Wo Contrast  Result Date: 09/17/2019 CLINICAL DATA:  Nasal trauma after fall EXAM: CT MAXILLOFACIAL WITHOUT CONTRAST TECHNIQUE: Multidetector CT imaging of the maxillofacial structures was performed. Multiplanar CT image reconstructions were also generated. COMPARISON:  None. FINDINGS: Osseous: No depressed nasal bone fracture. Orbital walls intact without fracture. Osseous alignment is normal. Temporomandibular joints are intact without dislocation. No mandibular fracture or bone lesion. Advanced degenerative changes of the visualized upper cervical spine without acute finding. Orbits: Negative. No traumatic or inflammatory finding. Sinuses: Clear. Soft tissues: Mild soft tissue swelling over the nasal region without discrete soft tissue fluid collection or hematoma. Limited intracranial: No significant  or unexpected finding. IMPRESSION: Mild soft tissue swelling over the nasal region. No depressed nasal bone fracture or other acute maxillofacial bone fracture. Electronically Signed   By: Davina Poke D.O.   On: 09/17/2019 19:54    Procedures .Marland KitchenLaceration Repair  Date/Time: 09/17/2019 8:51 PM Performed by: Deno Etienne, DO Authorized by: Deno Etienne, DO   Consent:    Consent obtained:  Verbal   Consent given by:  Patient   Risks discussed:  Infection, pain, poor cosmetic result and poor wound healing   Alternatives discussed:  No treatment, delayed treatment and observation Anesthesia (see MAR for exact dosages):    Anesthesia method:  Local infiltration   Local anesthetic:  Lidocaine 2% WITH epi Laceration details:    Location:  Face   Face location:  Nose   Length (cm):  5.6 Repair type:    Repair type:  Intermediate Pre-procedure details:    Preparation:  Patient was prepped and draped in usual sterile fashion Exploration:    Wound exploration: entire depth of wound probed and visualized     Wound extent: no underlying fracture noted     Contaminated: no   Treatment:    Wound cleansed with: Chlorhexidine.   Amount of cleaning:  Standard   Irrigation solution:  Sterile saline   Irrigation volume:  30   Irrigation method:  Syringe   Visualized foreign bodies/material removed: no   Skin repair:    Repair method:  Sutures   Suture size:  5-0   Suture material:  Fast-absorbing gut   Suture technique:  Simple interrupted   Number of sutures:  4 Approximation:    Approximation:  Close Post-procedure details:    Dressing:  Open (no dressing)   Patient tolerance of procedure:  Tolerated well, no immediate complications .Marland KitchenLaceration Repair  Date/Time: 09/17/2019 8:52 PM Performed by: Deno Etienne, DO Authorized by: Deno Etienne, DO   Consent:    Consent obtained:  Verbal   Consent given by:  Patient   Risks discussed:  Infection   Alternatives discussed:  No treatment,  delayed treatment and observation Anesthesia (see MAR for exact dosages):    Anesthesia method:  Local infiltration   Local anesthetic:  Lidocaine 2% WITH epi Laceration details:    Location:  Hand   Hand location:  L hand, dorsum   Length (cm):  12 Repair type:    Repair type:  Intermediate Pre-procedure details:    Preparation:  Patient was prepped and draped in usual sterile fashion Exploration:    Hemostasis achieved with:  Epinephrine and direct pressure   Wound exploration: entire depth of wound probed and visualized     Wound extent: no underlying fracture noted     Contaminated: no   Treatment:    Wound cleansed with: Chlorhexidine.   Amount of cleaning:  Extensive   Irrigation solution:  Sterile saline   Irrigation volume:  60   Irrigation method:  Syringe Skin repair:    Repair method:  Sutures   Suture size:  4-0   Suture material:  Nylon   Suture technique:  Simple interrupted   Number of sutures:  8 Approximation:    Approximation:  Close Post-procedure details:    Dressing:  Open (no dressing)   Patient tolerance of procedure:  Tolerated well, no immediate complications   (including critical care time)  Medications Ordered in ED Medications  lidocaine-EPINEPHrine (XYLOCAINE W/EPI) 2 %-1:200000 (PF) injection 20 mL (20 mLs Infiltration Given by Other 09/17/19 2047)    ED Course  I have reviewed the triage vital signs and the nursing notes.  Pertinent labs & imaging results that were available during my care of the patient were reviewed by me and considered in my medical decision making (see chart for details).    MDM Rules/Calculators/A&P                      86 yo M with a chief complaints of a fall.  Nonsyncopal in nature.  CT scan of the head and face are negative for acute injury.  Plain film of the left hand without acute fracture.  Wounds were sutured at bedside.  Discharge home.  8:53 PM:  I have discussed the diagnosis/risks/treatment options  with the patient and believe the pt to be eligible for discharge home to follow-up with PCP. We also discussed returning to the ED immediately if new or worsening sx occur. We discussed the sx which are most concerning (e.g., sudden worsening pain, fever, inability to tolerate by mouth) that necessitate immediate return. Medications administered to the patient during their visit and any new prescriptions provided to the patient are listed below.  Medications given during this visit Medications  lidocaine-EPINEPHrine (XYLOCAINE W/EPI) 2 %-1:200000 (PF) injection 20 mL (20 mLs Infiltration Given by Other 09/17/19 2047)     The patient appears reasonably screen and/or stabilized for discharge and I doubt any other medical condition or other [facility] requiring further screening, evaluation, or treatment in the ED at this time prior to discharge.   Final Clinical Impression(s) / ED Diagnoses Final diagnoses:  Fall, initial encounter  Laceration of nose, initial encounter  Laceration of left hand without foreign body, initial encounter    Rx / DC Orders ED Discharge Orders    None       Deno Etienne, DO 09/17/19 2053

## 2019-09-17 NOTE — Discharge Instructions (Signed)
Water can run over these but do not scrub them.  The sutures in your nose should dissolve on their own.  If they are still there after about 5 days you can gently plucked them out with tweezers.  The sutures in her hand remain out of nylon and will need to be removed sometime between 7 and 10 days.  This can happen here at the family doctor's office or at urgent care.  We are always worried about possible infection when you have a break in the skin.  Please return if you notice redness drainage or if you get a fever.

## 2019-09-17 NOTE — ED Notes (Signed)
Pts LACs cleaned and dressed. This RN spoke to daughter on the phone to update status and DC instructions.

## 2019-09-26 ENCOUNTER — Other Ambulatory Visit: Payer: Self-pay | Admitting: Cardiovascular Disease

## 2019-10-06 DIAGNOSIS — R2689 Other abnormalities of gait and mobility: Secondary | ICD-10-CM | POA: Insufficient documentation

## 2019-10-26 ENCOUNTER — Ambulatory Visit: Payer: Medicare Other | Attending: Internal Medicine

## 2019-10-26 DIAGNOSIS — Z23 Encounter for immunization: Secondary | ICD-10-CM | POA: Insufficient documentation

## 2019-10-26 NOTE — Progress Notes (Signed)
   Covid-19 Vaccination Clinic  Name:  [name]    MRN: [id number] DOB: 03-16-1928  10/26/2019  [name] was observed post Covid-19 immunization for 15 minutes without incident. He was provided with Vaccine Information Sheet and instruction to access the V-Safe system.   [name] was instructed to call 911 with any severe reactions post vaccine: Marland Kitchen Difficulty breathing  . Swelling of face and throat  . A fast heartbeat  . A bad rash all over body  . Dizziness and weakness   Immunizations Administered    Name Date Dose VIS Date Route   Pfizer COVID-19 Vaccine 10/26/2019 12:58 PM 0.3 mL 07/31/2019 Intramuscular   Manufacturer: [place]   Lot: [id number]   [place]: [id number]

## 2019-11-23 ENCOUNTER — Emergency Department (HOSPITAL_COMMUNITY)
Admission: EM | Admit: 2019-11-23 | Discharge: 2019-11-23 | Disposition: A | Discharge status: Home / Self Care | Payer: Medicare Other | Attending: Emergency Medicine | Admitting: Emergency Medicine

## 2019-11-23 ENCOUNTER — Other Ambulatory Visit: Payer: Self-pay

## 2019-11-23 ENCOUNTER — Encounter (HOSPITAL_COMMUNITY): Payer: Self-pay

## 2019-11-23 ENCOUNTER — Emergency Department (HOSPITAL_COMMUNITY): Payer: Medicare Other

## 2019-11-23 DIAGNOSIS — Z79899 Other long term (current) drug therapy: Secondary | ICD-10-CM | POA: Diagnosis not present

## 2019-11-23 DIAGNOSIS — I10 Essential (primary) hypertension: Secondary | ICD-10-CM | POA: Insufficient documentation

## 2019-11-23 DIAGNOSIS — M545 Low back pain, unspecified: Secondary | ICD-10-CM

## 2019-11-23 DIAGNOSIS — Z8673 Personal history of transient ischemic attack (TIA), and cerebral infarction without residual deficits: Secondary | ICD-10-CM | POA: Insufficient documentation

## 2019-11-23 DIAGNOSIS — Z7982 Long term (current) use of aspirin: Secondary | ICD-10-CM | POA: Diagnosis not present

## 2019-11-23 DIAGNOSIS — R109 Unspecified abdominal pain: Secondary | ICD-10-CM | POA: Diagnosis not present

## 2019-11-23 DIAGNOSIS — I2510 Atherosclerotic heart disease of native coronary artery without angina pectoris: Secondary | ICD-10-CM | POA: Diagnosis not present

## 2019-11-23 DIAGNOSIS — M549 Dorsalgia, unspecified: Secondary | ICD-10-CM | POA: Diagnosis present

## 2019-11-23 DIAGNOSIS — Z7901 Long term (current) use of anticoagulants: Secondary | ICD-10-CM | POA: Diagnosis not present

## 2019-11-23 LAB — URINALYSIS, ROUTINE W REFLEX MICROSCOPIC
Bilirubin Urine: NEGATIVE
Glucose, UA: NEGATIVE mg/dL
Hgb urine dipstick: NEGATIVE
Ketones, ur: NEGATIVE mg/dL
Leukocytes,Ua: NEGATIVE
Nitrite: NEGATIVE
Protein, ur: NEGATIVE mg/dL
Specific Gravity, Urine: 1.014 (ref 1.005–1.030)
pH: 5 (ref 5.0–8.0)

## 2019-11-23 MED ORDER — HYDROCODONE-ACETAMINOPHEN 5-325 MG PO TABS: 1.0000 | ORAL_TABLET | ORAL | 0 refills | Status: DC | PRN

## 2019-11-23 MED ORDER — OXYCODONE HCL 5 MG PO TABS
5.0000 mg | ORAL_TABLET | Freq: Once | ORAL | Status: AC
  Administered 2019-11-23: 5 mg via ORAL
  Filled 2019-11-23: qty 1

## 2019-11-23 NOTE — ED Triage Notes (Signed)
Pt presents with c/o lower back pain. Pt reports no falls since January but this pain started yesterday. Pt reports the pain is intermittent.

## 2019-11-23 NOTE — ED Notes (Signed)
Patient was attempting to provide a urine sample but was having difficulty starting a stream. Patient in CT and will retry following scan.

## 2019-11-23 NOTE — ED Notes (Signed)
Patient bladder scanned before in and out cath, Bladder scanner showed 150mls in patient bladder. After in and out cath, patient noted to have 233ml output.

## 2019-11-23 NOTE — ED Provider Notes (Signed)
[place] DEPT Provider Note   CSN: [id number] Arrival date & time: 11/23/19  [id number]     History Chief Complaint  Patient presents with  . Back Pain    [name] is a 86 y.o. male.  86 year old male presents with acute onset of low back pain which began this morning.  Has had noted some urinary frequency.  Denies any fever or chills.  No radicular symptoms of his back pain.  No hematuria noted.  No associated emesis.  No history of trauma.  Has been medicating with Tylenol with limited relief.  No prior history of same.  Denies any rashes to his back.  Symptoms better with remaining still and worse with movement.  Patient does use a cane as needed        Past Medical History:  Diagnosis Date  . AV BLOCK, 1ST DEGREE   . Carotid artery disease ([place])    a. s/p Left ECA followed by Dr. Donnetta Hutching  . Coronary artery disease   . Coronary artery disease involving coronary bypass graft of native heart with angina pectoris ([place])    a. LHC 12/2015  3vd Left Cx 100% with colaterals, and distally LAD occluded, DES to prox-mid RCA  b. 02/2017: repeat cath with patent RCA stent and stable CAD; 02/27/2017: Rota-PCI to Ost RI  . GERD (gastroesophageal reflux disease)   . HLD (hyperlipidemia)   . HTN (hypertension)   . NSVT (nonsustained ventricular tachycardia) ([place])    during stress test 12/2015  . Presence of drug coated stent in RCA & Ramus Intermedius. 02/28/2017   12/2015: PCI p-mRCA - Stent Resolute Integ 3.5x34 02/2017: Rota-PCI o-mRamus - overlapping DES STENT SYNERGY DES 3X32  & 3x24 (tapered post-dilation)  . Stroke [facility])     Patient Active Problem List   Diagnosis Date Noted  . Laryngitis 04/17/2017  . Cough 03/29/2017  . Acute bronchitis 03/29/2017  . Acute upper respiratory infection 03/29/2017  . Presence of drug coated stent in RCA & Ramus Intermedius. 02/28/2017  . Gout flare 02/22/2017  . Coronary artery disease involving native coronary artery of  native heart with angina pectoris ([place])   . Carotid artery disease ([place])   . HLD (hyperlipidemia)   . Unstable angina ([place]) 02/20/2017  . Atherosclerosis of native coronary artery of native heart   . Essential hypertension 01/06/2009  . Second degree atrioventricular block, Mobitz type I 01/06/2009    Past Surgical History:  Procedure Laterality Date  . CARDIAC CATHETERIZATION     x2  . CARDIAC CATHETERIZATION N/A 01/11/2016   Procedure: Left Heart Cath and Coronary Angiography;  Surgeon: Wellington Hampshire, MD;  Location: [place] CV LAB;  Service: Cardiovascular;  Laterality: N/A;  . CAROTID ENDARTERECTOMY  11/28/2006   left  . CORONARY ATHERECTOMY N/A 02/27/2017   Procedure: Coronary Atherectomy;  Surgeon: Troy Sine, MD;  Location: [place] [facility] LAB;  Service: Cardiovascular;  Laterality: N/A;  . HERNIA REPAIR    . LEFT HEART CATH AND CORONARY ANGIOGRAPHY N/A 02/21/2017   Procedure: Left Heart Cath and Coronary Angiography;  Surgeon: Lorretta Harp, MD;  Location: [place] [facility] LAB;  Service: Cardiovascular;  Laterality: N/A;  . TONSILLECTOMY         Family History  Problem Relation Age of Onset  . Diabetes Mother   . Diabetes Sister   . Cancer Sister 39       colon  . Heart disease Sister     Social History  Tobacco Use  . Smoking status: Never Smoker  . Smokeless tobacco: Never Used  Substance Use Topics  . Alcohol use: Yes    Alcohol/week: 15.0 standard drinks    Types: 15 Glasses of wine per week  . Drug use: No    Home Medications Prior to Admission medications   Medication Sig Start Date End Date Taking? Authorizing Provider  acetaminophen (TYLENOL) 500 MG tablet Take 1 tablet (500 mg total) by mouth every 6 (six) hours as needed for moderate pain (wrist). 02/28/17   Cheryln Manly, NP  amLODipine (NORVASC) 10 MG tablet TAKE 1 TABLET DAILY Patient taking differently: Take 10 mg by mouth daily.  12/31/18   Josue Hector, MD  aspirin EC 81 MG  EC tablet Take 1 tablet (81 mg total) by mouth daily. 01/12/16   Cheryln Manly, NP  b complex vitamins tablet Take 1 tablet by mouth daily.    [provider]  Calcium Carbonate (CALCIUM 500 PO) Take 1 tablet by mouth daily.    [provider]  Cholecalciferol (VITAMIN D-3) 1000 units CAPS Take 1 capsule by mouth daily.    [provider]  clopidogrel (PLAVIX) 75 MG tablet Take 1 tablet (75 mg total) by mouth daily with breakfast. 04/30/19   Josue Hector, MD  Glucosamine 500 MG CAPS Take 1 capsule by mouth daily.     [provider]  isosorbide mononitrate (IMDUR) 60 MG 24 hr tablet TAKE ONE AND ONE-HALF TABLETS DAILY Patient taking differently: Take 90 mg by mouth daily.  12/31/18   Josue Hector, MD  lisinopril (ZESTRIL) 40 MG tablet TAKE 1 TABLET DAILY 09/28/19   Josue Hector, MD  Multiple Vitamin (MULTIVITAMIN) capsule Take 1 capsule by mouth daily.    [provider]  mupirocin ointment (BACTROBAN) 2 % Apply 1 application topically 3 (three) times daily. Lower left leg 09/11/19   [provider]  nitroGLYCERIN (NITROSTAT) 0.4 MG SL tablet Place 1 tablet (0.4 mg total) under the tongue every 5 (five) minutes as needed for chest pain (not to exceed 3 doses.). 04/14/19   Josue Hector, MD  Omega-3 Fatty Acids (FISH OIL) 1000 MG CAPS Take 1 capsule by mouth daily.    [provider]  ranolazine (RANEXA) 1000 MG SR tablet TAKE 1 TABLET TWICE A DAY Patient taking differently: Take 1,000 mg by mouth 2 (two) times daily.  07/03/19   Josue Hector, MD  simvastatin (ZOCOR) 20 MG tablet TAKE 1 TABLET DAILY Patient taking differently: Take 20 mg by mouth daily at 6 PM.  03/09/19   Josue Hector, MD  vitamin C (ASCORBIC ACID) 500 MG tablet Take 500 mg by mouth daily.      [provider]    Allergies    Beta adrenergic blockers  Review of Systems   Review of Systems  All other systems reviewed and are  negative.   Physical Exam Updated Vital Signs BP 121/66 (BP Location: Left Arm)   Pulse 76   Temp 97.6 F (36.4 C) (Oral)   Resp 17   SpO2 99%   Physical Exam Vitals and nursing note reviewed.  Constitutional:      General: He is not in acute distress.    Appearance: Normal appearance. He is well-developed. He is not toxic-appearing.  HENT:     Head: Normocephalic and atraumatic.  Eyes:     General: Lids are normal.     Conjunctiva/sclera: Conjunctivae normal.  Pupils: Pupils are equal, round, and reactive to light.  Neck:     Thyroid: No thyroid mass.     Trachea: No tracheal deviation.  Cardiovascular:     Rate and Rhythm: Normal rate and regular rhythm.     Heart sounds: Normal heart sounds. No murmur. No gallop.   Pulmonary:     Effort: Pulmonary effort is normal. No respiratory distress.     Breath sounds: Normal breath sounds. No stridor. No decreased breath sounds, wheezing, rhonchi or rales.  Abdominal:     General: Bowel sounds are normal. There is no distension.     Palpations: Abdomen is soft.     Tenderness: There is no abdominal tenderness. There is no rebound.  Musculoskeletal:        General: No tenderness. Normal range of motion.     Cervical back: Normal range of motion and neck supple.       Legs:  Skin:    General: Skin is warm and dry.     Findings: No abrasion or rash.  Neurological:     Mental Status: He is alert and oriented to person, place, and time.     GCS: GCS eye subscore is 4. GCS verbal subscore is 5. GCS motor subscore is 6.     Cranial Nerves: No cranial nerve deficit.     Sensory: No sensory deficit.     Motor: No weakness or tremor.     Deep Tendon Reflexes:     Reflex Scores:      Patellar reflexes are 2+ on the right side and 2+ on the left side. Psychiatric:        Speech: Speech normal.        Behavior: Behavior normal.     ED Results / Procedures / Treatments   Labs (all labs ordered are listed, but only abnormal  results are displayed) Labs Reviewed  URINE CULTURE  URINALYSIS, ROUTINE W REFLEX MICROSCOPIC    EKG None  Radiology No results found.  Procedures Procedures (including critical care time)  Medications Ordered in ED Medications  oxyCODONE (Oxy IR/ROXICODONE) immediate release tablet 5 mg (has no administration in time range)    ED Course  I have reviewed the triage vital signs and the nursing notes.  Pertinent labs & imaging results that were available during my care of the patient were reviewed by me and considered in my medical decision making (see chart for details).    MDM Rules/Calculators/A&P                      Patient medicated for pain and feels better.  CT renal without acute findings.  Urinalysis negative.  Suspect musculoskeletal back pain.  No lower extremity neurological deficits.  Will discharge Final Clinical Impression(s) / ED Diagnoses Final diagnoses:  None    Rx / DC Orders ED Discharge Orders    None       Lacretia Leigh, MD 11/23/19 1143

## 2019-11-24 ENCOUNTER — Ambulatory Visit: Payer: Medicare Other | Attending: Internal Medicine

## 2019-11-24 DIAGNOSIS — Z23 Encounter for immunization: Secondary | ICD-10-CM

## 2019-11-24 NOTE — Progress Notes (Signed)
   Covid-19 Vaccination Clinic  Name:  Elbin Plucinski    MRN: [id number] DOB: 10/30/1927  11/24/2019  Mr. Ruter was observed post Covid-19 immunization for 15 minutes without incident. He was provided with Vaccine Information Sheet and instruction to access the V-Safe system.   Mr. Mitchener was instructed to call 911 with any severe reactions post vaccine: Marland Kitchen Difficulty breathing  . Swelling of face and throat  . A fast heartbeat  . A bad rash all over body  . Dizziness and weakness   Immunizations Administered    Name Date Dose VIS Date Route   Pfizer COVID-19 Vaccine 11/24/2019  1:46 PM 0.3 mL 07/31/2019 Intramuscular   Manufacturer: [employer], [employer]   Lot: [id number]   [place]: [id number]

## 2019-11-25 ENCOUNTER — Ambulatory Visit: Payer: Medicare Other

## 2019-11-25 LAB — URINE CULTURE: Culture: NO GROWTH

## 2020-03-03 ENCOUNTER — Other Ambulatory Visit: Payer: Self-pay | Admitting: Cardiovascular Disease

## 2020-04-06 NOTE — Progress Notes (Signed)
Date:  04/20/2020   ID:  [name], DOB 1928/03/26, MRN [id number]  Provider Location: Office  PCP:  [name], MD  Cardiologist:  Johnsie Cancel Electrophysiologist:  None   Evaluation Performed:  Follow-Up Visit  Chief Complaint:  CAD/ Vascular Dx  History of Present Illness:    86 y.o.  f/u for CAD and PVD. History of DES to RCA July 6222 complicated by right radial AV fistula. Returned 02/27/17 with DES To Ramus. Lifelong DAT recommended History of AV block on beta blockers. Post left CEA with duplex 12/06/16 plaque no restenosis or new denovo disease. CRF;s include HLD and HTN. Compliant with meds EF has been normal   He has a residual fistula in right radial artery VVS knows about but does not need correction no steal  He drinks 3-4 glasses of white wine daily. He walks about 30 minutes daily.  His daughter and grandson are with him at home Wife of 43 years Passed last year He is vaccinated but daughter is not    The patient does not have symptoms concerning for COVID-19 infection (fever, chills, cough, or new shortness of breath).    Past Medical History:  Diagnosis Date  . AV BLOCK, 1ST DEGREE   . Carotid artery disease ([place])    a. s/p Left ECA followed by Dr. Donnetta Hutching  . Coronary artery disease   . Coronary artery disease involving coronary bypass graft of native heart with angina pectoris ([place])    a. LHC 12/2015  3vd Left Cx 100% with colaterals, and distally LAD occluded, DES to prox-mid RCA  b. 02/2017: repeat cath with patent RCA stent and stable CAD; 02/27/2017: Rota-PCI to Ost RI  . GERD (gastroesophageal reflux disease)   . HLD (hyperlipidemia)   . HTN (hypertension)   . NSVT (nonsustained ventricular tachycardia) ([place])    during stress test 12/2015  . Presence of drug coated stent in RCA & Ramus Intermedius. 02/28/2017   12/2015: PCI p-mRCA - Stent Resolute Integ 3.5x34 02/2017: Rota-PCI o-mRamus - overlapping DES STENT SYNERGY DES 3X32  & 3x24 (tapered  post-dilation)  . Stroke [facility])    Past Surgical History:  Procedure Laterality Date  . CARDIAC CATHETERIZATION     x2  . CARDIAC CATHETERIZATION N/A 01/11/2016   Procedure: Left Heart Cath and Coronary Angiography;  Surgeon: Wellington Hampshire, MD;  Location: [place] [facility] LAB;  Service: Cardiovascular;  Laterality: N/A;  . CAROTID ENDARTERECTOMY  11/28/2006   left  . CORONARY ATHERECTOMY N/A 02/27/2017   Procedure: Coronary Atherectomy;  Surgeon: Troy Sine, MD;  Location: [place] [facility] LAB;  Service: Cardiovascular;  Laterality: N/A;  . HERNIA REPAIR    . LEFT HEART CATH AND CORONARY ANGIOGRAPHY N/A 02/21/2017   Procedure: Left Heart Cath and Coronary Angiography;  Surgeon: Lorretta Harp, MD;  Location: [place] [facility] LAB;  Service: Cardiovascular;  Laterality: N/A;  . TONSILLECTOMY       Current Meds  Medication Sig  . acetaminophen (TYLENOL) 500 MG tablet Take 1 tablet (500 mg total) by mouth every 6 (six) hours as needed for moderate pain (wrist).  Marland Kitchen amLODipine (NORVASC) 10 MG tablet TAKE 1 TABLET DAILY (Patient taking differently: Take 10 mg by mouth daily. )  . aspirin EC 81 MG EC tablet Take 1 tablet (81 mg total) by mouth daily.  Marland Kitchen b complex vitamins tablet Take 1 tablet by mouth daily.  . Calcium Carbonate (CALCIUM 500 PO) Take 1 tablet by mouth daily.  Marland Kitchen  Cholecalciferol (VITAMIN D-3) 1000 units CAPS Take 1 capsule by mouth daily.  . clopidogrel (PLAVIX) 75 MG tablet Take 1 tablet (75 mg total) by mouth daily with breakfast.  . Glucosamine 500 MG CAPS Take 1 capsule by mouth daily.   Marland Kitchen HYDROcodone-acetaminophen (NORCO/VICODIN) 5-325 MG tablet Take 1 tablet by mouth every 4 (four) hours as needed.  . isosorbide mononitrate (IMDUR) 60 MG 24 hr tablet TAKE ONE AND ONE-HALF TABLETS DAILY (Patient taking differently: Take 90 mg by mouth daily. )  . lisinopril (ZESTRIL) 40 MG tablet TAKE 1 TABLET DAILY  . Multiple Vitamin (MULTIVITAMIN) capsule Take 1 capsule by mouth daily.   . mupirocin ointment (BACTROBAN) 2 % Apply 1 application topically 3 (three) times daily. Lower left leg  . nitroGLYCERIN (NITROSTAT) 0.4 MG SL tablet Place 1 tablet (0.4 mg total) under the tongue every 5 (five) minutes as needed for chest pain (not to exceed 3 doses.).  Marland Kitchen Omega-3 Fatty Acids (FISH OIL) 1000 MG CAPS Take 1 capsule by mouth daily.  . ranolazine (RANEXA) 1000 MG SR tablet TAKE 1 TABLET TWICE A DAY (Patient taking differently: Take 1,000 mg by mouth 2 (two) times daily. )  . simvastatin (ZOCOR) 20 MG tablet TAKE 1 TABLET DAILY  . vitamin C (ASCORBIC ACID) 500 MG tablet Take 500 mg by mouth daily.       Allergies:   Beta adrenergic blockers   Social History   Tobacco Use  . Smoking status: Never Smoker  . Smokeless tobacco: Never Used  Vaping Use  . Vaping Use: Never used  Substance Use Topics  . Alcohol use: Yes    Alcohol/week: 15.0 standard drinks    Types: 15 Glasses of wine per week  . Drug use: No     Family Hx: The patient's family history includes Cancer (age of onset: 9) in his sister; Diabetes in his mother and sister; Heart disease in his sister.  ROS:   Please see the history of present illness.     All other systems reviewed and are negative.   Prior CV studies:   The following studies were reviewed today:  Carotid Duplex:  01/23/18  40-59% RICA left CEA patent plaque no stenosis   LHC: 02/27/17  Conclusion     Prox RCA to Mid RCA lesion, 0 %stenosed.  Mid RCA lesion, 30 %stenosed.  Dist RCA lesion, 70 %stenosed.  Ost Cx to Prox Cx lesion, 100 %stenosed.  Dist LAD lesion, 100 %stenosed.  Mid LAD lesion, 50 %stenosed.  Ost LAD to Prox LAD lesion, 60 %stenosed.  Ramus-2 lesion, 70 %stenosed.  A STENT SYNERGY DES 3X32 drug eluting stent was successfully placed.  Ramus-1 lesion, 80 %stenosed.  Post intervention, there is a 0% residual stenosis.  A STENT SYNERGY DES 3X24 drug eluting stent was successfully placed, and overlaps  previously placed stent.  Ost Ramus lesion, 95 %stenosed.  Post intervention, there is a 0% residual stenosis.  Successful complex PCI to the ramus intermediate vessel which had a 95% ostial stenosis followed by diffuse 80% proximal stenosis and 70% mid stenosis. The three lesion were treated with high-speed rotational atherectomy with a 1.5 mm and a 1.75 mm burr, cutting balloon atherotomy with a 3.010 mm Wolverine balloon, and tandem DES stenting with a 3.032 mm and 3.024 mm Synergy DES stents postdilated to 3.28 m millimeters ostially to 3.21 mm distally with the entire segments being reduced to 0%.   RECOMMENDATION: Lifelong DAPT. Continue aggressive medical therapy for the patient's  severe concomitant CAD.     Labs/Other Tests and Data Reviewed:     EKG:   SR rate 56 PR 328 otherwise normal 04/20/20   Recent Labs: No results found for requested labs within last 8760 hours.   Recent Lipid Panel Lab Results  Component Value Date/Time   CHOL 109 02/21/2017 02:04 AM   TRIG 52 02/21/2017 02:04 AM   HDL 46 02/21/2017 02:04 AM   CHOLHDL 2.4 02/21/2017 02:04 AM   LDLCALC 53 02/21/2017 02:04 AM    Wt Readings from Last 3 Encounters:  04/20/20 153 lb (69.4 kg)  09/17/19 154 lb (69.9 kg)  06/11/19 156 lb (70.8 kg)     Objective:    Vital Signs:  BP (!) 108/58   Pulse (!) 56   Ht 5\' 6"  (1.676 m)   Wt 153 lb (69.4 kg)   BMI 24.69 kg/m    Affect appropriate Elderly white male  HEENT: left CEA residual bruit  Neck supple with no adenopathy JVP normal right  bruits no thyromegaly Lungs clear with no wheezing and good diaphragmatic motion Heart:  S1/S2 no murmur, no rub, gallop or click PMI normal Abdomen: benighn, BS positve, no tenderness, no AAA no bruit.  No HSM or HJR Distal pulses intact right radial fistula  No edema Neuro non-focal Skin warm and dry No muscular weakness   ASSESSMENT & PLAN:    CAD status post PCI with high-speed rotational atherectomy  in overlapping drug-eluting stents to the ramus 02/27/17. Currently without angina. Continue Imdur, Ranexa, Plavix and aspirin Has patent stent to mid RCA. Residual distal RCA and occluded distal LAD   HTN:   Well controlled.  Continue current medications and low sodium Dash type diet.  Avoid beta blocker history of AV block  HLD  continue simvastatin.labs with primary   Carotid:  F/u VVS post left CEA 1275 complicated by stroke with residual right hand tremor   Duplex 06/11/19  40-59% RICA will order f/u study for October   Fistula:  Right radial post cath no steal no symptoms but somewhat impressive on exam   SSS:  F/u ECG for long PR no beta blocker PR today 328 msec   COVID-19 Education: The signs and symptoms of COVID-19 were discussed with the patient and how to seek care for testing (follow up with PCP or arrange E-visit).  The importance of social distancing was discussed today.  Time:   Today, I have spent 30 minutes with the patient    Medication Adjustments/Labs and Tests Ordered: Current medicines are reviewed at length with the patient today.  Concerns regarding medicines are outlined above.   Tests Ordered:  Carotid duplex October 2021 post Left CEA with residual right sided disease   Medication Changes: No orders of the defined types were placed in this encounter.   Disposition:  Follow up 6 months  Signed, Jenkins Rouge, MD  04/20/2020 10:22 AM    [place]

## 2020-04-20 ENCOUNTER — Ambulatory Visit (INDEPENDENT_AMBULATORY_CARE_PROVIDER_SITE_OTHER): Payer: Medicare Other | Admitting: Cardiovascular Disease

## 2020-04-20 ENCOUNTER — Encounter: Payer: Self-pay | Admitting: Cardiovascular Disease

## 2020-04-20 ENCOUNTER — Other Ambulatory Visit: Payer: Self-pay

## 2020-04-20 VITALS — BP 108/58 | HR 56 | Ht 66.0 in | Wt 153.0 lb

## 2020-04-20 DIAGNOSIS — I779 Disorder of arteries and arterioles, unspecified: Secondary | ICD-10-CM

## 2020-04-20 DIAGNOSIS — I2510 Atherosclerotic heart disease of native coronary artery without angina pectoris: Secondary | ICD-10-CM

## 2020-04-20 NOTE — Patient Instructions (Addendum)
Medication Instructions:  *If you need a refill on your cardiac medications before your next appointment, please call your pharmacy*  Lab Work: If you have labs (blood work) drawn today and your tests are completely normal, you will receive your results only by: Marland Kitchen MyChart Message (if you have MyChart) OR . A paper copy in the mail If you have any lab test that is abnormal or we need to change your treatment, we will call you to review the results.  Testing/Procedures: Your physician has requested that you have a carotid duplex in October. This test is an ultrasound of the carotid arteries in your neck. It looks at blood flow through these arteries that supply the brain with blood. Allow one hour for this exam. There are no restrictions or special instructions.  Follow-Up: At [facility], you and your health needs are our priority.  As part of our continuing mission to provide you with exceptional heart care, we have created designated Provider Care Teams.  These Care Teams include your primary Cardiologist (physician) and Advanced Practice Providers (APPs -  Physician Assistants and Nurse Practitioners) who all work together to provide you with the care you need, when you need it.  We recommend signing up for the patient portal called "MyChart".  Sign up information is provided on this After Visit Summary.  MyChart is used to connect with patients for Virtual Visits (Telemedicine).  Patients are able to view lab/test results, encounter notes, upcoming appointments, etc.  Non-urgent messages can be sent to your provider as well.   To learn more about what you can do with MyChart, go to [email or web address].    Your next appointment:   6 month(s)  The format for your next appointment:   In Person  Provider:   You may see Dr. Johnsie Cancel or one of the following Advanced Practice Providers on your designated Care Team:    Truitt Merle, NP  Cecilie Kicks, NP  Kathyrn Drown, NP

## 2020-04-23 ENCOUNTER — Other Ambulatory Visit: Payer: Self-pay | Admitting: Cardiovascular Disease

## 2020-04-23 DIAGNOSIS — R0989 Other specified symptoms and signs involving the circulatory and respiratory systems: Secondary | ICD-10-CM

## 2020-06-02 ENCOUNTER — Other Ambulatory Visit (HOSPITAL_COMMUNITY): Payer: Self-pay | Admitting: Cardiovascular Disease

## 2020-06-02 ENCOUNTER — Ambulatory Visit (HOSPITAL_COMMUNITY)
Admission: RE | Admit: 2020-06-02 | Discharge: 2020-06-02 | Disposition: A | Discharge status: Home / Self Care | Payer: Medicare Other | Source: Ambulatory Visit | Attending: Internal Medicine | Admitting: Internal Medicine

## 2020-06-02 ENCOUNTER — Other Ambulatory Visit: Payer: Self-pay | Admitting: Cardiovascular Disease

## 2020-06-02 ENCOUNTER — Other Ambulatory Visit: Payer: Self-pay

## 2020-06-02 DIAGNOSIS — I6523 Occlusion and stenosis of bilateral carotid arteries: Secondary | ICD-10-CM

## 2020-06-02 DIAGNOSIS — I2510 Atherosclerotic heart disease of native coronary artery without angina pectoris: Secondary | ICD-10-CM | POA: Insufficient documentation

## 2020-06-02 DIAGNOSIS — Z9889 Other specified postprocedural states: Secondary | ICD-10-CM

## 2020-06-02 DIAGNOSIS — I779 Disorder of arteries and arterioles, unspecified: Secondary | ICD-10-CM | POA: Diagnosis not present

## 2020-06-24 ENCOUNTER — Other Ambulatory Visit: Payer: Self-pay | Admitting: Cardiovascular Disease

## 2020-10-14 NOTE — Progress Notes (Deleted)
Date:  10/14/2020   ID:  [name], DOB 1928-01-18, MRN [id number]  Provider Location: Office  PCP:  [name], MD  Cardiologist:  Johnsie Cancel Electrophysiologist:  None   Evaluation Performed:  Follow-Up Visit  Chief Complaint:  CAD/ Vascular Dx  History of Present Illness:    87 y.o.  f/u for CAD and PVD. History of DES to RCA July 0932 complicated by right radial AV fistula. Returned 02/27/17 with DES To Ramus. Lifelong DAT recommended History of AV block on beta blockers. Post left CEA with duplex . 06/02/20 left CEA patent 60-79% RICA stenosis CRF;s include HLD and HTN. Compliant with meds EF has been normal   He has a residual fistula in right radial artery VVS knows about but does not need correction no steal  He drinks 3-4 glasses of white wine daily. He walks about 30 minutes daily.  His daughter and grandson are with him at home Wife of 72 years Passed 2020  He is vaccinated but daughter is not   ***  Past Medical History:  Diagnosis Date  . AV BLOCK, 1ST DEGREE   . Carotid artery disease ([place])    a. s/p Left ECA followed by Dr. Donnetta Hutching  . Coronary artery disease   . Coronary artery disease involving coronary bypass graft of native heart with angina pectoris ([place])    a. LHC 12/2015  3vd Left Cx 100% with colaterals, and distally LAD occluded, DES to prox-mid RCA  b. 02/2017: repeat cath with patent RCA stent and stable CAD; 02/27/2017: Rota-PCI to Ost RI  . GERD (gastroesophageal reflux disease)   . HLD (hyperlipidemia)   . HTN (hypertension)   . NSVT (nonsustained ventricular tachycardia) ([place])    during stress test 12/2015  . Presence of drug coated stent in RCA & Ramus Intermedius. 02/28/2017   12/2015: PCI p-mRCA - Stent Resolute Integ 3.5x34 02/2017: Rota-PCI o-mRamus - overlapping DES STENT SYNERGY DES 3X32  & 3x24 (tapered post-dilation)  . Stroke [facility])    Past Surgical History:  Procedure Laterality Date  . CARDIAC CATHETERIZATION     x2  . CARDIAC  CATHETERIZATION N/A 01/11/2016   Procedure: Left Heart Cath and Coronary Angiography;  Surgeon: Wellington Hampshire, MD;  Location: [place] [facility];  Service: Cardiovascular;  Laterality: N/A;  . CAROTID ENDARTERECTOMY  11/28/2006   left  . CORONARY ATHERECTOMY N/A 02/27/2017   Procedure: Coronary Atherectomy;  Surgeon: Troy Sine, MD;  Location: [place] [facility];  Service: Cardiovascular;  Laterality: N/A;  . HERNIA REPAIR    . LEFT HEART CATH AND CORONARY ANGIOGRAPHY N/A 02/21/2017   Procedure: Left Heart Cath and Coronary Angiography;  Surgeon: Lorretta Harp, MD;  Location: [place] [facility];  Service: Cardiovascular;  Laterality: N/A;  . TONSILLECTOMY       No outpatient medications have been marked as taking for the 10/20/20 encounter (Appointment) with Josue Hector, MD.     Allergies:   Beta adrenergic blockers   Social History   Tobacco Use  . Smoking status: Never Smoker  . Smokeless tobacco: Never Used  Vaping Use  . Vaping Use: Never used  Substance Use Topics  . Alcohol use: Yes    Alcohol/week: 15.0 standard drinks    Types: 15 Glasses of wine per week  . Drug use: No     Family Hx: The patient's family history includes Cancer (age of onset: 68) in his sister; Diabetes in his mother and sister; Heart  disease in his sister.  ROS:   Please see the history of present illness.     All other systems reviewed and are negative.   Prior CV studies:   The following studies were reviewed today:  Carotid Duplex:  01/23/18  40-59% RICA left CEA patent plaque no stenosis   LHC: 02/27/17  Conclusion     Prox RCA to Mid RCA lesion, 0 %stenosed.  Mid RCA lesion, 30 %stenosed.  Dist RCA lesion, 70 %stenosed.  Ost Cx to Prox Cx lesion, 100 %stenosed.  Dist LAD lesion, 100 %stenosed.  Mid LAD lesion, 50 %stenosed.  Ost LAD to Prox LAD lesion, 60 %stenosed.  Ramus-2 lesion, 70 %stenosed.  A STENT SYNERGY DES 3X32 drug eluting stent was successfully  placed.  Ramus-1 lesion, 80 %stenosed.  Post intervention, there is a 0% residual stenosis.  A STENT SYNERGY DES 3X24 drug eluting stent was successfully placed, and overlaps previously placed stent.  Ost Ramus lesion, 95 %stenosed.  Post intervention, there is a 0% residual stenosis.  Successful complex PCI to the ramus intermediate vessel which had a 95% ostial stenosis followed by diffuse 80% proximal stenosis and 70% mid stenosis. The three lesion were treated with high-speed rotational atherectomy with a 1.5 mm and a 1.75 mm burr, cutting balloon atherotomy with a 3.010 mm Wolverine balloon, and tandem DES stenting with a 3.032 mm and 3.024 mm Synergy DES stents postdilated to 3.28 m millimeters ostially to 3.21 mm distally with the entire segments being reduced to 0%.   RECOMMENDATION: Lifelong DAPT. Continue aggressive medical therapy for the patient's severe concomitant CAD.     Labs/Other Tests and Data Reviewed:     EKG:   SR rate 56 PR 328 otherwise normal 04/20/20   Recent Labs: No results found for requested labs within last 8760 hours.   Recent Lipid Panel Lab Results  Component Value Date/Time   CHOL 109 02/21/2017 02:04 AM   TRIG 52 02/21/2017 02:04 AM   HDL 46 02/21/2017 02:04 AM   CHOLHDL 2.4 02/21/2017 02:04 AM   LDLCALC 53 02/21/2017 02:04 AM    Wt Readings from Last 3 Encounters:  04/20/20 69.4 kg  09/17/19 69.9 kg  06/11/19 70.8 kg     Objective:    Vital Signs:  There were no vitals taken for this visit.   Affect appropriate Elderly white male  HEENT: left CEA residual bruit  Neck supple with no adenopathy JVP normal right  bruits no thyromegaly Lungs clear with no wheezing and good diaphragmatic motion Heart:  S1/S2 no murmur, no rub, gallop or click PMI normal Abdomen: benighn, BS positve, no tenderness, no AAA no bruit.  No HSM or HJR Distal pulses intact right radial fistula  No edema Neuro non-focal Skin warm and dry No  muscular weakness   ASSESSMENT & PLAN:    CAD status post PCI with high-speed rotational atherectomy in overlapping drug-eluting stents to the ramus 02/27/17.  Patent stent to mid RCA with residaul 70% distal RCA and occluded distal LAD Currently without angina. Continue Imdur, Ranexa, Plavix and aspirin    HTN:   Well controlled.  Continue current medications and low sodium Dash type diet.  Avoid beta blocker history of AV block  HLD  continue simvastatin.labs with primary   Carotid:  F/u VVS post left CEA 2202 complicated by stroke with residual right hand tremor   Duplex 06/02/20  60-79% RICA     Fistula:  Right radial post cath no steal no  symptoms but somewhat impressive on exam   SSS:  F/u ECG for long PR no beta blocker PR 04/20/20 328 msec   COVID-19 Education: The signs and symptoms of COVID-19 were discussed with the patient and how to seek care for testing (follow up with PCP or arrange E-visit).  The importance of social distancing was discussed today.   Medication Adjustments/Labs and Tests Ordered: Current medicines are reviewed at length with the patient today.  Concerns regarding medicines are outlined above.   Tests Ordered:  ***  Medication Changes: No orders of the defined types were placed in this encounter.   Disposition:  Follow up 6 months  Signed, Jenkins Rouge, MD  10/14/2020 12:03 PM    [place]

## 2020-10-18 ENCOUNTER — Ambulatory Visit: Payer: Medicare Other | Admitting: Cardiovascular Disease

## 2020-10-20 ENCOUNTER — Ambulatory Visit: Payer: Medicare Other | Admitting: Cardiovascular Disease

## 2020-11-07 NOTE — Progress Notes (Signed)
Date:  11/10/2020   ID:  [name], DOB 1928-05-08, MRN [id number]  Provider Location: Office  PCP:  [name], MD  Cardiologist:  Johnsie Cancel Electrophysiologist:  None   Evaluation Performed:  Follow-Up Visit  Chief Complaint:  CAD/ Vascular Dx  History of Present Illness:    87 y.o.  f/u for CAD and PVD. History of DES to RCA July 4854 complicated by right radial AV fistula. Returned 02/27/17 with DES To Ramus. Lifelong DAT recommended History of AV block on beta blockers. Post left CEA with duplex . 06/02/20 left CEA patent 60-79% RICA stenosis CRF;s include HLD and HTN. Compliant with meds EF has been normal   He has a residual fistula in right radial artery VVS knows about but does not need correction no steal  He drinks 3-4 glasses of white wine daily. He walks about 30 minutes daily.  His daughter and grandson are with him at home Wife of 53 years Passed 2020  He is vaccinated but daughter is not   He is now severely limited by left hip pain Can barely walk to mail box Has not had injections in hip In over 4 years and only taking Tylenol   Past Medical History:  Diagnosis Date  . AV BLOCK, 1ST DEGREE   . Carotid artery disease ([place])    a. s/p Left ECA followed by Dr. Donnetta Hutching  . Coronary artery disease   . Coronary artery disease involving coronary bypass graft of native heart with angina pectoris ([place])    a. LHC 12/2015  3vd Left Cx 100% with colaterals, and distally LAD occluded, DES to prox-mid RCA  b. 02/2017: repeat cath with patent RCA stent and stable CAD; 02/27/2017: Rota-PCI to Ost RI  . GERD (gastroesophageal reflux disease)   . HLD (hyperlipidemia)   . HTN (hypertension)   . NSVT (nonsustained ventricular tachycardia) ([place])    during stress test 12/2015  . Presence of drug coated stent in RCA & Ramus Intermedius. 02/28/2017   12/2015: PCI p-mRCA - Stent Resolute Integ 3.5x34 02/2017: Rota-PCI o-mRamus - overlapping DES STENT SYNERGY DES 3X32  & 3x24  (tapered post-dilation)  . Stroke [facility])    Past Surgical History:  Procedure Laterality Date  . CARDIAC CATHETERIZATION     x2  . CARDIAC CATHETERIZATION N/A 01/11/2016   Procedure: Left Heart Cath and Coronary Angiography;  Surgeon: Wellington Hampshire, MD;  Location: [place] [facility];  Service: Cardiovascular;  Laterality: N/A;  . CAROTID ENDARTERECTOMY  11/28/2006   left  . CORONARY ATHERECTOMY N/A 02/27/2017   Procedure: Coronary Atherectomy;  Surgeon: Troy Sine, MD;  Location: [place] [facility];  Service: Cardiovascular;  Laterality: N/A;  . HERNIA REPAIR    . LEFT HEART CATH AND CORONARY ANGIOGRAPHY N/A 02/21/2017   Procedure: Left Heart Cath and Coronary Angiography;  Surgeon: Lorretta Harp, MD;  Location: [place] [facility];  Service: Cardiovascular;  Laterality: N/A;  . TONSILLECTOMY       Current Meds  Medication Sig  . acetaminophen (TYLENOL) 500 MG tablet Take 1 tablet (500 mg total) by mouth every 6 (six) hours as needed for moderate pain (wrist).  Marland Kitchen amLODipine (NORVASC) 10 MG tablet TAKE 1 TABLET DAILY (Patient taking differently: Take 10 mg by mouth daily.)  . aspirin EC 81 MG EC tablet Take 1 tablet (81 mg total) by mouth daily.  Marland Kitchen b complex vitamins tablet Take 1 tablet by mouth daily.  . Calcium Carbonate (CALCIUM 500  PO) Take 1 tablet by mouth daily.  . Cholecalciferol (VITAMIN D-3) 1000 units CAPS Take 1 capsule by mouth daily.  . clopidogrel (PLAVIX) 75 MG tablet Take 1 tablet (75 mg total) by mouth daily.  . Glucosamine 500 MG CAPS Take 1 capsule by mouth daily.   Marland Kitchen HYDROcodone-acetaminophen (NORCO/VICODIN) 5-325 MG tablet Take 1 tablet by mouth every 4 (four) hours as needed.  . isosorbide mononitrate (IMDUR) 60 MG 24 hr tablet TAKE ONE AND ONE-HALF TABLETS DAILY (Patient taking differently: Take 90 mg by mouth daily.)  . lisinopril (ZESTRIL) 40 MG tablet TAKE 1 TABLET DAILY  . Multiple Vitamin (MULTIVITAMIN) capsule Take 1 capsule by mouth daily.  .  mupirocin ointment (BACTROBAN) 2 % Apply 1 application topically 3 (three) times daily. Lower left leg  . nitroGLYCERIN (NITROSTAT) 0.4 MG SL tablet PLACE 1 TABLET UNDER THE TONGUE EVERY 5 MINUTES AS NEEDED FOR CHEST PAIN. NOT TO EXCEED 3 DOSES  . Omega-3 Fatty Acids (FISH OIL) 1000 MG CAPS Take 1 capsule by mouth daily.  . ranolazine (RANEXA) 1000 MG SR tablet TAKE 1 TABLET TWICE A DAY  . simvastatin (ZOCOR) 20 MG tablet TAKE 1 TABLET DAILY  . vitamin C (ASCORBIC ACID) 500 MG tablet Take 500 mg by mouth daily.     Allergies:   Beta adrenergic blockers   Social History   Tobacco Use  . Smoking status: Never Smoker  . Smokeless tobacco: Never Used  Vaping Use  . Vaping Use: Never used  Substance Use Topics  . Alcohol use: Yes    Alcohol/week: 15.0 standard drinks    Types: 15 Glasses of wine per week  . Drug use: No     Family Hx: The patient's family history includes Cancer (age of onset: 7) in his sister; Diabetes in his mother and sister; Heart disease in his sister.  ROS:   Please see the history of present illness.     All other systems reviewed and are negative.   Prior CV studies:   The following studies were reviewed today:  Carotid Duplex:  01/23/18  40-59% RICA left CEA patent plaque no stenosis   LHC: 02/27/17  Conclusion     Prox RCA to Mid RCA lesion, 0 %stenosed.  Mid RCA lesion, 30 %stenosed.  Dist RCA lesion, 70 %stenosed.  Ost Cx to Prox Cx lesion, 100 %stenosed.  Dist LAD lesion, 100 %stenosed.  Mid LAD lesion, 50 %stenosed.  Ost LAD to Prox LAD lesion, 60 %stenosed.  Ramus-2 lesion, 70 %stenosed.  A STENT SYNERGY DES 3X32 drug eluting stent was successfully placed.  Ramus-1 lesion, 80 %stenosed.  Post intervention, there is a 0% residual stenosis.  A STENT SYNERGY DES 3X24 drug eluting stent was successfully placed, and overlaps previously placed stent.  Ost Ramus lesion, 95 %stenosed.  Post intervention, there is a 0% residual  stenosis.  Successful complex PCI to the ramus intermediate vessel which had a 95% ostial stenosis followed by diffuse 80% proximal stenosis and 70% mid stenosis. The three lesion were treated with high-speed rotational atherectomy with a 1.5 mm and a 1.75 mm burr, cutting balloon atherotomy with a 3.010 mm Wolverine balloon, and tandem DES stenting with a 3.032 mm and 3.024 mm Synergy DES stents postdilated to 3.28 m millimeters ostially to 3.21 mm distally with the entire segments being reduced to 0%.   RECOMMENDATION: Lifelong DAPT. Continue aggressive medical therapy for the patient's severe concomitant CAD.     Labs/Other Tests and Data Reviewed:  EKG:   SR rate 56 PR 328 otherwise normal 04/20/20   Recent Labs: No results found for requested labs within last 8760 hours.   Recent Lipid Panel Lab Results  Component Value Date/Time   CHOL 109 02/21/2017 02:04 AM   TRIG 52 02/21/2017 02:04 AM   HDL 46 02/21/2017 02:04 AM   CHOLHDL 2.4 02/21/2017 02:04 AM   LDLCALC 53 02/21/2017 02:04 AM    Wt Readings from Last 3 Encounters:  11/10/20 72.1 kg  04/20/20 69.4 kg  09/17/19 69.9 kg     Objective:    Vital Signs:  BP 100/72   Pulse (!) 48   Ht 5\' 6"  (1.676 m)   Wt 72.1 kg   SpO2 98%   BMI 25.66 kg/m    Affect appropriate Elderly white male  HEENT: left CEA residual bruit  Neck supple with no adenopathy JVP normal right  bruits no thyromegaly Lungs clear with no wheezing and good diaphragmatic motion Heart:  S1/S2 no murmur, no rub, gallop or click PMI normal Abdomen: benighn, BS positve, no tenderness, no AAA no bruit.  No HSM or HJR Distal pulses intact right radial fistula  No edema Neuro non-focal Skin warm and dry No muscular weakness   ASSESSMENT & PLAN:    CAD status post PCI with high-speed rotational atherectomy in overlapping drug-eluting stents to the ramus 02/27/17.  Patent stent to mid RCA with residaul 70% distal RCA and occluded distal  LAD Currently without angina. Continue Imdur, Ranexa, Plavix and aspirin    HTN:   Well controlled.  Continue current medications and low sodium Dash type diet.  Avoid beta blocker history of AV block  HLD  continue simvastatin.labs with primary   Carotid:  F/u VVS post left CEA 8118 complicated by stroke with residual right hand tremor   Duplex 06/02/20  60-79% RICA     Fistula:  Right radial post cath no steal no symptoms but somewhat impressive on exam   SSS:  F/u ECG for long PR no beta blocker PR 04/20/20 328 msec   Ortho:  2 view xray left hip ordered refer to Dr Ninfa Linden. Given age and CAD may not be an operative candidate But may benefit from injections/PT or other Rx's they offer   COVID-19 Education: The signs and symptoms of COVID-19 were discussed with the patient and how to seek care for testing (follow up with PCP or arrange E-visit).  The importance of social distancing was discussed today.   Medication Adjustments/Labs and Tests Ordered: Current medicines are reviewed at length with the patient today.  Concerns regarding medicines are outlined above.   Tests Ordered:  Left Hip Xray F/U with me 6 months  Refer Dr Zollie Beck[facility]   Medication Changes: No orders of the defined types were placed in this encounter.   Disposition:  Follow up 6 months  Signed, Jenkins Rouge, MD  11/10/2020 2:20 PM    [place]

## 2020-11-10 ENCOUNTER — Ambulatory Visit (INDEPENDENT_AMBULATORY_CARE_PROVIDER_SITE_OTHER): Payer: Medicare Other | Admitting: Cardiovascular Disease

## 2020-11-10 ENCOUNTER — Encounter (INDEPENDENT_AMBULATORY_CARE_PROVIDER_SITE_OTHER): Payer: Self-pay

## 2020-11-10 ENCOUNTER — Other Ambulatory Visit: Payer: Self-pay

## 2020-11-10 ENCOUNTER — Encounter: Payer: Self-pay | Admitting: Cardiovascular Disease

## 2020-11-10 VITALS — BP 100/72 | HR 48 | Ht 66.0 in | Wt 159.0 lb

## 2020-11-10 DIAGNOSIS — M25552 Pain in left hip: Secondary | ICD-10-CM | POA: Diagnosis not present

## 2020-11-10 DIAGNOSIS — I6523 Occlusion and stenosis of bilateral carotid arteries: Secondary | ICD-10-CM

## 2020-11-10 DIAGNOSIS — M1612 Unilateral primary osteoarthritis, left hip: Secondary | ICD-10-CM | POA: Diagnosis not present

## 2020-11-10 DIAGNOSIS — I2510 Atherosclerotic heart disease of native coronary artery without angina pectoris: Secondary | ICD-10-CM

## 2020-11-10 NOTE — Patient Instructions (Signed)
Medication Instructions:  *If you need a refill on your cardiac medications before your next appointment, please call your pharmacy*  Lab Work: If you have labs (blood work) drawn today and your tests are completely normal, you will receive your results only by: Marland Kitchen MyChart Message (if you have MyChart) OR . A paper copy in the mail If you have any lab test that is abnormal or we need to change your treatment, we will call you to review the results.  Testing/Procedures: Two view left hip x-rays.    Follow-Up: At [facility], you and your health needs are our priority.  As part of our continuing mission to provide you with exceptional heart care, we have created designated Provider Care Teams.  These Care Teams include your primary Cardiologist (physician) and Advanced Practice Providers (APPs -  Physician Assistants and Nurse Practitioners) who all work together to provide you with the care you need, when you need it.  We recommend signing up for the patient portal called "MyChart".  Sign up information is provided on this After Visit Summary.  MyChart is used to connect with patients for Virtual Visits (Telemedicine).  Patients are able to view lab/test results, encounter notes, upcoming appointments, etc.  Non-urgent messages can be sent to your provider as well.   To learn more about what you can do with MyChart, go to [email or web address].    Your next appointment:   6 month(s)  The format for your next appointment:   In Person  Provider:   You may see Dr. Johnsie Cancel or one of the following Advanced Practice Providers on your designated Care Team:    Kathyrn Drown, NP    Other Instructions You have been referred to Dr. Ninfa Linden for Left Hip.

## 2020-11-11 ENCOUNTER — Ambulatory Visit (HOSPITAL_COMMUNITY)
Admission: RE | Admit: 2020-11-11 | Discharge: 2020-11-11 | Disposition: A | Discharge status: Home / Self Care | Payer: Medicare Other | Source: Ambulatory Visit | Attending: Cardiovascular Disease | Admitting: Cardiovascular Disease

## 2020-11-11 DIAGNOSIS — M25552 Pain in left hip: Secondary | ICD-10-CM | POA: Insufficient documentation

## 2020-11-11 DIAGNOSIS — M1612 Unilateral primary osteoarthritis, left hip: Secondary | ICD-10-CM | POA: Diagnosis present

## 2020-11-21 ENCOUNTER — Other Ambulatory Visit: Payer: Self-pay

## 2020-11-21 ENCOUNTER — Ambulatory Visit: Payer: Self-pay

## 2020-11-21 ENCOUNTER — Encounter: Payer: Self-pay | Admitting: Orthopaedic Surgery

## 2020-11-21 ENCOUNTER — Ambulatory Visit (INDEPENDENT_AMBULATORY_CARE_PROVIDER_SITE_OTHER): Payer: Medicare Other | Admitting: Orthopaedic Surgery

## 2020-11-21 VITALS — Ht 66.0 in | Wt 159.0 lb

## 2020-11-21 DIAGNOSIS — I6523 Occlusion and stenosis of bilateral carotid arteries: Secondary | ICD-10-CM

## 2020-11-21 DIAGNOSIS — M1612 Unilateral primary osteoarthritis, left hip: Secondary | ICD-10-CM

## 2020-11-21 NOTE — Progress Notes (Signed)
Subjective: Patient is here for ultrasound-guided intra-articular left hip injection.   Pain from DJD.  Still an active bowler.  Objective:  Pain with passive IR.  Procedure: Ultrasound guided injection is preferred based studies that show increased duration, increased effect, greater accuracy, decreased procedural pain, increased response rate, and decreased cost with ultrasound guided versus blind injection.   Verbal informed consent obtained.  Time-out conducted.  Noted no overlying erythema, induration, or other signs of local infection. Ultrasound-guided left hip injection: After sterile prep with Betadine, injected 4 cc 0.25% bupivacaine without epinephrine and 6 mg betamethasone using a 22-gauge spinal needle, passing the needle through the iliofemoral ligament into the femoral head/neck junction.  Injectate seen filling joint capsule.

## 2020-11-21 NOTE — Progress Notes (Signed)
Office Visit Note   Patient: [name]           Date of Birth: 14-Jun-1928           MRN: [id number] Visit Date: 11/21/2020              Requested by: Josue Hector, MD [phone] [address] [place],  [place] [address] PCP: [name], MD   Assessment & Plan: Visit Diagnoses:  1. Unilateral primary osteoarthritis, left hip     Plan: I did go over the patient's x-rays with him.  He does have quite significant left hip osteoarthritis.  Since it has been so long since he had an intra-articular steroid injection in that left hip, I think it is reasonable to pursue another injection in his left hip as to see.  We will see if Dr. Junius Roads can do this under ultrasound and then I can see the patient back in about 3 months to see how he is doing overall.  Obviously hip replacement surgery would be the last thing would recommend hip all conservative treatment measures fail and he could be cleared for the surgery by cardiology.  All questions and concerns were answered and addressed.  Follow-Up Instructions: Return in about 3 months (around 02/20/2021).   Orders:  No orders of the defined types were placed in this encounter.  No orders of the defined types were placed in this encounter.     Procedures: No procedures performed   Clinical Data: No additional findings.   Subjective: Chief Complaint  Patient presents with  . Left Hip - Pain  The patient is a very pleasant 87 year old gentleman sent from Dr. Johnsie Cancel to evaluate and treat known osteoarthritis of the left hip.  The patient reports that he did have an intra-articular steroid injection done under x-ray in [place] about 4 years ago.  He said that did help.  His left hip does hurt in the groin area and hurts with weightbearing activities and pivoting.  At this point he started to detrimentally affect his mobility and his quality of life.  He is someone he does have significant CAD and is on Eliquis as well.  He is  not injured that hip before and injections did help in the past.  HPI  Review of Systems He currently denies any chest pain, shortness of breath, fever, chills, nausea, vomiting  Objective: Vital Signs: Ht 5\' 6"  (1.676 m)   Wt 159 lb (72.1 kg)   BMI 25.66 kg/m   Physical Exam He is alert and oriented x3 and in no acute distress he is not walking with any type of assistive device Ortho Exam Examination of his right hip is normal.  Examination of his left hip shows some stiffness and pain with internal and external rotation.  That is all in the groin area. Specialty Comments:  No specialty comments available.  Imaging: No results found. X-rays on the canopy system reviewed of his pelvis and left hip show severe end-stage arthritis of the left hip with bone-on-bone wear and loss of the superior joint space.  There is also some small cystic changes in the femoral head and acetabulum as well as sclerotic changes.  PMFS History: Patient Active Problem List   Diagnosis Date Noted  . Laryngitis 04/17/2017  . Cough 03/29/2017  . Acute bronchitis 03/29/2017  . Acute upper respiratory infection 03/29/2017  . Presence of drug coated stent in RCA & Ramus Intermedius. 02/28/2017  . Gout flare  02/22/2017  . Coronary artery disease involving native coronary artery of native heart with angina pectoris ([place])   . Carotid artery disease ([place])   . HLD (hyperlipidemia)   . Unstable angina ([place]) 02/20/2017  . Atherosclerosis of native coronary artery of native heart   . Essential hypertension 01/06/2009  . Second degree atrioventricular block, Mobitz type I 01/06/2009   Past Medical History:  Diagnosis Date  . AV BLOCK, 1ST DEGREE   . Carotid artery disease ([place])    a. s/p Left ECA followed by Dr. Donnetta Hutching  . Coronary artery disease   . Coronary artery disease involving coronary bypass graft of native heart with angina pectoris ([place])    a. LHC 12/2015  3vd Left Cx 100% with colaterals, and  distally LAD occluded, DES to prox-mid RCA  b. 02/2017: repeat cath with patent RCA stent and stable CAD; 02/27/2017: Rota-PCI to Ost RI  . GERD (gastroesophageal reflux disease)   . HLD (hyperlipidemia)   . HTN (hypertension)   . NSVT (nonsustained ventricular tachycardia) ([place])    during stress test 12/2015  . Presence of drug coated stent in RCA & Ramus Intermedius. 02/28/2017   12/2015: PCI p-mRCA - Stent Resolute Integ 3.5x34 02/2017: Rota-PCI o-mRamus - overlapping DES STENT SYNERGY DES 3X32  & 3x24 (tapered post-dilation)  . Stroke [facility])     Family History  Problem Relation Age of Onset  . Diabetes Mother   . Diabetes Sister   . Cancer Sister 37       colon  . Heart disease Sister     Past Surgical History:  Procedure Laterality Date  . CARDIAC CATHETERIZATION     x2  . CARDIAC CATHETERIZATION N/A 01/11/2016   Procedure: Left Heart Cath and Coronary Angiography;  Surgeon: Wellington Hampshire, MD;  Location: [place] [facility];  Service: Cardiovascular;  Laterality: N/A;  . CAROTID ENDARTERECTOMY  11/28/2006   left  . CORONARY ATHERECTOMY N/A 02/27/2017   Procedure: Coronary Atherectomy;  Surgeon: Troy Sine, MD;  Location: [place] [facility];  Service: Cardiovascular;  Laterality: N/A;  . HERNIA REPAIR    . LEFT HEART CATH AND CORONARY ANGIOGRAPHY N/A 02/21/2017   Procedure: Left Heart Cath and Coronary Angiography;  Surgeon: Lorretta Harp, MD;  Location: [place];  Service: Cardiovascular;  Laterality: N/A;  . TONSILLECTOMY     Social History   Occupational History  . Not on file  Tobacco Use  . Smoking status: Never Smoker  . Smokeless tobacco: Never Used  Vaping Use  . Vaping Use: Never used  Substance and Sexual Activity  . Alcohol use: Yes    Alcohol/week: 15.0 standard drinks    Types: 15 Glasses of wine per week  . Drug use: No  . Sexual activity: Not on file

## 2020-12-17 ENCOUNTER — Encounter (HOSPITAL_COMMUNITY): Payer: Self-pay | Admitting: Emergency Medicine

## 2020-12-17 ENCOUNTER — Other Ambulatory Visit: Payer: Self-pay

## 2020-12-17 ENCOUNTER — Emergency Department (HOSPITAL_COMMUNITY): Payer: Medicare Other

## 2020-12-17 ENCOUNTER — Observation Stay (HOSPITAL_COMMUNITY)
Admission: EM | Admit: 2020-12-17 | Discharge: 2020-12-18 | Disposition: A | Discharge status: Home / Self Care | Payer: Medicare Other | Attending: Family Medicine | Admitting: Family Medicine

## 2020-12-17 DIAGNOSIS — Z7982 Long term (current) use of aspirin: Secondary | ICD-10-CM | POA: Insufficient documentation

## 2020-12-17 DIAGNOSIS — I4891 Unspecified atrial fibrillation: Secondary | ICD-10-CM | POA: Diagnosis not present

## 2020-12-17 DIAGNOSIS — J9601 Acute respiratory failure with hypoxia: Secondary | ICD-10-CM | POA: Diagnosis not present

## 2020-12-17 DIAGNOSIS — Z8673 Personal history of transient ischemic attack (TIA), and cerebral infarction without residual deficits: Secondary | ICD-10-CM | POA: Insufficient documentation

## 2020-12-17 DIAGNOSIS — I129 Hypertensive chronic kidney disease with stage 1 through stage 4 chronic kidney disease, or unspecified chronic kidney disease: Secondary | ICD-10-CM | POA: Insufficient documentation

## 2020-12-17 DIAGNOSIS — E782 Mixed hyperlipidemia: Secondary | ICD-10-CM | POA: Diagnosis not present

## 2020-12-17 DIAGNOSIS — Z79899 Other long term (current) drug therapy: Secondary | ICD-10-CM | POA: Diagnosis not present

## 2020-12-17 DIAGNOSIS — D631 Anemia in chronic kidney disease: Secondary | ICD-10-CM | POA: Diagnosis not present

## 2020-12-17 DIAGNOSIS — N1831 Chronic kidney disease, stage 3a: Secondary | ICD-10-CM | POA: Diagnosis not present

## 2020-12-17 DIAGNOSIS — R52 Pain, unspecified: Secondary | ICD-10-CM

## 2020-12-17 DIAGNOSIS — E785 Hyperlipidemia, unspecified: Secondary | ICD-10-CM | POA: Diagnosis present

## 2020-12-17 DIAGNOSIS — I10 Essential (primary) hypertension: Secondary | ICD-10-CM | POA: Diagnosis not present

## 2020-12-17 DIAGNOSIS — I2510 Atherosclerotic heart disease of native coronary artery without angina pectoris: Secondary | ICD-10-CM | POA: Insufficient documentation

## 2020-12-17 DIAGNOSIS — N183 Chronic kidney disease, stage 3 unspecified: Secondary | ICD-10-CM

## 2020-12-17 DIAGNOSIS — U071 COVID-19: Principal | ICD-10-CM | POA: Insufficient documentation

## 2020-12-17 DIAGNOSIS — J189 Pneumonia, unspecified organism: Secondary | ICD-10-CM | POA: Diagnosis not present

## 2020-12-17 DIAGNOSIS — J1282 Pneumonia due to coronavirus disease 2019: Secondary | ICD-10-CM | POA: Diagnosis not present

## 2020-12-17 DIAGNOSIS — R059 Cough, unspecified: Secondary | ICD-10-CM | POA: Diagnosis present

## 2020-12-17 DIAGNOSIS — Z955 Presence of coronary angioplasty implant and graft: Secondary | ICD-10-CM | POA: Diagnosis not present

## 2020-12-17 DIAGNOSIS — Z7902 Long term (current) use of antithrombotics/antiplatelets: Secondary | ICD-10-CM | POA: Diagnosis not present

## 2020-12-17 DIAGNOSIS — I25119 Atherosclerotic heart disease of native coronary artery with unspecified angina pectoris: Secondary | ICD-10-CM

## 2020-12-17 DIAGNOSIS — N189 Chronic kidney disease, unspecified: Secondary | ICD-10-CM

## 2020-12-17 LAB — C-REACTIVE PROTEIN: CRP: 4.2 mg/dL — ABNORMAL HIGH (ref <1.0)

## 2020-12-17 LAB — CBC WITH DIFFERENTIAL/PLATELET
Abs Immature Granulocytes: 0.03 10*3/uL (ref 0.00–0.07)
Basophils Absolute: 0 10*3/uL (ref 0.0–0.1)
Basophils Relative: 0 %
Eosinophils Absolute: 0 10*3/uL (ref 0.0–0.5)
Eosinophils Relative: 0 %
HCT: 37.6 % — ABNORMAL LOW (ref 39.0–52.0)
Hemoglobin: 12 g/dL — ABNORMAL LOW (ref 13.0–17.0)
Immature Granulocytes: 1 %
Lymphocytes Relative: 17 %
Lymphs Abs: 1 10*3/uL (ref 0.7–4.0)
MCH: 31.2 pg (ref 26.0–34.0)
MCHC: 31.9 g/dL (ref 30.0–36.0)
MCV: 97.7 fL (ref 80.0–100.0)
Monocytes Absolute: 1.2 10*3/uL — ABNORMAL HIGH (ref 0.1–1.0)
Monocytes Relative: 21 %
Neutro Abs: 3.4 10*3/uL (ref 1.7–7.7)
Neutrophils Relative %: 61 %
Platelets: 167 10*3/uL (ref 150–400)
RBC: 3.85 MIL/uL — ABNORMAL LOW (ref 4.22–5.81)
RDW: 15.2 % (ref 11.5–15.5)
WBC: 5.6 10*3/uL (ref 4.0–10.5)
nRBC: 0 % (ref 0.0–0.2)

## 2020-12-17 LAB — CBC
HCT: 34 % — ABNORMAL LOW (ref 39.0–52.0)
Hemoglobin: 11.1 g/dL — ABNORMAL LOW (ref 13.0–17.0)
MCH: 30.9 pg (ref 26.0–34.0)
MCHC: 32.6 g/dL (ref 30.0–36.0)
MCV: 94.7 fL (ref 80.0–100.0)
Platelets: 155 10*3/uL (ref 150–400)
RBC: 3.59 MIL/uL — ABNORMAL LOW (ref 4.22–5.81)
RDW: 15.1 % (ref 11.5–15.5)
WBC: 4.5 10*3/uL (ref 4.0–10.5)
nRBC: 0 % (ref 0.0–0.2)

## 2020-12-17 LAB — MAGNESIUM: Magnesium: 1.7 mg/dL (ref 1.7–2.4)

## 2020-12-17 LAB — PROTIME-INR
INR: 1.1 (ref 0.8–1.2)
Prothrombin Time: 14.3 seconds (ref 11.4–15.2)

## 2020-12-17 LAB — D-DIMER, QUANTITATIVE: D-Dimer, Quant: 1.7 ug/mL-FEU — ABNORMAL HIGH (ref 0.00–0.50)

## 2020-12-17 LAB — COMPREHENSIVE METABOLIC PANEL
ALT: 17 U/L (ref 0–44)
ALT: 19 U/L (ref 0–44)
AST: 26 U/L (ref 15–41)
AST: 31 U/L (ref 15–41)
Albumin: 4.3 g/dL (ref 3.5–5.0)
Albumin: 3.5 g/dL (ref 3.5–5.0)
Alkaline Phosphatase: 67 U/L (ref 38–126)
Alkaline Phosphatase: 58 U/L (ref 38–126)
Anion gap: 12 (ref 5–15)
Anion gap: 12 (ref 5–15)
BUN: 24 mg/dL — ABNORMAL HIGH (ref 8–23)
BUN: 31 mg/dL — ABNORMAL HIGH (ref 8–23)
CO2: 17 mmol/L — ABNORMAL LOW (ref 22–32)
CO2: 16 mmol/L — ABNORMAL LOW (ref 22–32)
Calcium: 9.1 mg/dL (ref 8.9–10.3)
Calcium: 8.7 mg/dL — ABNORMAL LOW (ref 8.9–10.3)
Chloride: 108 mmol/L (ref 98–111)
Chloride: 112 mmol/L — ABNORMAL HIGH (ref 98–111)
Creatinine, Ser: 1.37 mg/dL — ABNORMAL HIGH (ref 0.61–1.24)
Creatinine, Ser: 1.38 mg/dL — ABNORMAL HIGH (ref 0.61–1.24)
GFR, Estimated: 49 mL/min — ABNORMAL LOW (ref >60)
GFR, Estimated: 48 mL/min — ABNORMAL LOW (ref >60)
Glucose, Bld: 97 mg/dL (ref 70–99)
Glucose, Bld: 108 mg/dL — ABNORMAL HIGH (ref 70–99)
Potassium: 4.5 mmol/L (ref 3.5–5.1)
Potassium: 4.8 mmol/L (ref 3.5–5.1)
Sodium: 137 mmol/L (ref 135–145)
Sodium: 140 mmol/L (ref 135–145)
Total Bilirubin: 0.8 mg/dL (ref 0.3–1.2)
Total Bilirubin: 0.9 mg/dL (ref 0.3–1.2)
Total Protein: 7.5 g/dL (ref 6.5–8.1)
Total Protein: 6.3 g/dL — ABNORMAL LOW (ref 6.5–8.1)

## 2020-12-17 LAB — PROCALCITONIN: Procalcitonin: <0.1 ng/mL

## 2020-12-17 LAB — LACTIC ACID, PLASMA: Lactic Acid, Venous: 1.5 mmol/L (ref 0.5–1.9)

## 2020-12-17 LAB — FERRITIN: Ferritin: 88 ng/mL (ref 24–336)

## 2020-12-17 LAB — URINALYSIS, ROUTINE W REFLEX MICROSCOPIC
Bilirubin Urine: NEGATIVE
Glucose, UA: NEGATIVE mg/dL
Hgb urine dipstick: NEGATIVE
Ketones, ur: 5 mg/dL — AB
Leukocytes,Ua: NEGATIVE
Nitrite: NEGATIVE
Protein, ur: NEGATIVE mg/dL
Specific Gravity, Urine: 1.01 (ref 1.005–1.030)
pH: 6 (ref 5.0–8.0)

## 2020-12-17 LAB — CK: Total CK: 95 U/L (ref 49–397)

## 2020-12-17 LAB — STREP PNEUMONIAE URINARY ANTIGEN: Strep Pneumo Urinary Antigen: NEGATIVE

## 2020-12-17 LAB — RESP PANEL BY RT-PCR (FLU A&B, COVID) ARPGX2
Influenza A by PCR: NEGATIVE
Influenza B by PCR: NEGATIVE
SARS Coronavirus 2 by RT PCR: POSITIVE — AB

## 2020-12-17 LAB — TSH: TSH: 2.254 u[IU]/mL (ref 0.350–4.500)

## 2020-12-17 LAB — APTT: aPTT: 35 seconds (ref 24–36)

## 2020-12-17 LAB — TROPONIN I (HIGH SENSITIVITY): Troponin I (High Sensitivity): 47 ng/L — ABNORMAL HIGH (ref <18)

## 2020-12-17 LAB — PHOSPHORUS: Phosphorus: 3.1 mg/dL (ref 2.5–4.6)

## 2020-12-17 MED ORDER — ACETAMINOPHEN 325 MG PO TABS: 650.0000 mg | ORAL_TABLET | Freq: Four times a day (QID) | ORAL | Status: DC | PRN

## 2020-12-17 MED ORDER — THIAMINE HCL 100 MG PO TABS
100.0000 mg | ORAL_TABLET | Freq: Every day | ORAL | Status: DC
  Administered 2020-12-17 – 2020-12-18 (×2): 100 mg via ORAL
  Filled 2020-12-17 (×2): qty 1

## 2020-12-17 MED ORDER — ASPIRIN EC 81 MG PO TBEC
81.0000 mg | DELAYED_RELEASE_TABLET | Freq: Every day | ORAL | Status: DC
  Administered 2020-12-17 – 2020-12-18 (×2): 81 mg via ORAL
  Filled 2020-12-17 (×2): qty 1

## 2020-12-17 MED ORDER — RANOLAZINE ER 500 MG PO TB12
1000.0000 mg | ORAL_TABLET | Freq: Two times a day (BID) | ORAL | Status: DC
  Administered 2020-12-17 – 2020-12-18 (×3): 1000 mg via ORAL
  Filled 2020-12-17 (×3): qty 2

## 2020-12-17 MED ORDER — SODIUM CHLORIDE 0.9 % IV SOLN
1.0000 g | Freq: Once | INTRAVENOUS | Status: AC
  Administered 2020-12-17: 1 g via INTRAVENOUS
  Filled 2020-12-17: qty 10

## 2020-12-17 MED ORDER — HEPARIN BOLUS VIA INFUSION
3000.0000 [IU] | Freq: Once | INTRAVENOUS | Status: AC
  Administered 2020-12-17: 3000 [IU] via INTRAVENOUS
  Filled 2020-12-17: qty 3000

## 2020-12-17 MED ORDER — SODIUM CHLORIDE 0.9 % IV SOLN: 500.0000 mg | INTRAVENOUS | Status: DC

## 2020-12-17 MED ORDER — AZITHROMYCIN 250 MG PO TABS
500.0000 mg | ORAL_TABLET | Freq: Once | ORAL | Status: AC
  Administered 2020-12-17: 500 mg via ORAL
  Filled 2020-12-17: qty 2

## 2020-12-17 MED ORDER — HEPARIN (PORCINE) 25000 UT/250ML-% IV SOLN
1000.0000 [IU]/h | INTRAVENOUS | Status: DC
  Administered 2020-12-17: 1000 [IU]/h via INTRAVENOUS
  Filled 2020-12-17: qty 250

## 2020-12-17 MED ORDER — ENOXAPARIN SODIUM 80 MG/0.8ML IJ SOSY
1.0000 mg/kg | PREFILLED_SYRINGE | Freq: Two times a day (BID) | INTRAMUSCULAR | Status: DC
  Filled 2020-12-17: qty 0.72

## 2020-12-17 MED ORDER — LORAZEPAM 1 MG PO TABS: 1.0000 mg | ORAL_TABLET | ORAL | Status: DC | PRN

## 2020-12-17 MED ORDER — ASCORBIC ACID 500 MG PO TABS
500.0000 mg | ORAL_TABLET | Freq: Every day | ORAL | Status: DC
  Administered 2020-12-17 – 2020-12-18 (×2): 500 mg via ORAL
  Filled 2020-12-17 (×2): qty 1

## 2020-12-17 MED ORDER — ADULT MULTIVITAMIN W/MINERALS CH
1.0000 | ORAL_TABLET | Freq: Every day | ORAL | Status: DC
  Administered 2020-12-17 – 2020-12-18 (×2): 1 via ORAL
  Filled 2020-12-17 (×2): qty 1

## 2020-12-17 MED ORDER — METHYLPREDNISOLONE SODIUM SUCC 40 MG IJ SOLR
36.0000 mg | Freq: Two times a day (BID) | INTRAMUSCULAR | Status: DC
  Administered 2020-12-17 – 2020-12-18 (×3): 36 mg via INTRAVENOUS
  Filled 2020-12-17 (×3): qty 1

## 2020-12-17 MED ORDER — SIMVASTATIN 20 MG PO TABS
20.0000 mg | ORAL_TABLET | Freq: Every day | ORAL | Status: DC
  Administered 2020-12-17 – 2020-12-18 (×2): 20 mg via ORAL
  Filled 2020-12-17 (×2): qty 1

## 2020-12-17 MED ORDER — LORAZEPAM 2 MG/ML IJ SOLN: 1.0000 mg | INTRAMUSCULAR | Status: DC | PRN

## 2020-12-17 MED ORDER — AMLODIPINE BESYLATE 10 MG PO TABS
10.0000 mg | ORAL_TABLET | Freq: Every day | ORAL | Status: DC
  Administered 2020-12-17 – 2020-12-18 (×2): 10 mg via ORAL
  Filled 2020-12-17: qty 2
  Filled 2020-12-17: qty 1

## 2020-12-17 MED ORDER — GUAIFENESIN-DM 100-10 MG/5ML PO SYRP
10.0000 mL | ORAL_SOLUTION | ORAL | Status: DC | PRN
  Filled 2020-12-17: qty 10

## 2020-12-17 MED ORDER — SODIUM CHLORIDE 0.9 % IV SOLN
100.0000 mg | Freq: Every day | INTRAVENOUS | Status: DC
  Administered 2020-12-18: 100 mg via INTRAVENOUS
  Filled 2020-12-17: qty 20

## 2020-12-17 MED ORDER — ZINC SULFATE 220 (50 ZN) MG PO CAPS
220.0000 mg | ORAL_CAPSULE | Freq: Every day | ORAL | Status: DC
  Administered 2020-12-17 – 2020-12-18 (×2): 220 mg via ORAL
  Filled 2020-12-17 (×2): qty 1

## 2020-12-17 MED ORDER — SODIUM CHLORIDE 0.9 % IV SOLN: 2.0000 g | INTRAVENOUS | Status: DC

## 2020-12-17 MED ORDER — SODIUM CHLORIDE 0.9 % IV SOLN
200.0000 mg | Freq: Once | INTRAVENOUS | Status: AC
  Administered 2020-12-17: 200 mg via INTRAVENOUS
  Filled 2020-12-17: qty 40

## 2020-12-17 MED ORDER — HYDROCOD POLST-CPM POLST ER 10-8 MG/5ML PO SUER: 5.0000 mL | Freq: Two times a day (BID) | ORAL | Status: DC | PRN

## 2020-12-17 MED ORDER — PREDNISONE 50 MG PO TABS: 50.0000 mg | ORAL_TABLET | Freq: Every day | ORAL | Status: DC

## 2020-12-17 MED ORDER — CLOPIDOGREL BISULFATE 75 MG PO TABS
75.0000 mg | ORAL_TABLET | Freq: Every day | ORAL | Status: DC
  Administered 2020-12-17 – 2020-12-18 (×2): 75 mg via ORAL
  Filled 2020-12-17 (×2): qty 1

## 2020-12-17 MED ORDER — LISINOPRIL 20 MG PO TABS
40.0000 mg | ORAL_TABLET | Freq: Every day | ORAL | Status: DC
  Administered 2020-12-17 – 2020-12-18 (×2): 40 mg via ORAL
  Filled 2020-12-17 (×2): qty 2

## 2020-12-17 MED ORDER — NITROGLYCERIN 0.4 MG SL SUBL: 0.4000 mg | SUBLINGUAL_TABLET | SUBLINGUAL | Status: DC | PRN

## 2020-12-17 MED ORDER — ALBUTEROL SULFATE HFA 108 (90 BASE) MCG/ACT IN AERS: 2.0000 | INHALATION_SPRAY | RESPIRATORY_TRACT | Status: DC | PRN

## 2020-12-17 MED ORDER — ENOXAPARIN SODIUM 80 MG/0.8ML IJ SOSY
70.0000 mg | PREFILLED_SYRINGE | Freq: Two times a day (BID) | INTRAMUSCULAR | Status: DC
  Administered 2020-12-17 – 2020-12-18 (×2): 70 mg via SUBCUTANEOUS
  Filled 2020-12-17: qty 0.8
  Filled 2020-12-17: qty 0.7
  Filled 2020-12-17: qty 0.8

## 2020-12-17 MED ORDER — ISOSORBIDE MONONITRATE ER 60 MG PO TB24
90.0000 mg | ORAL_TABLET | Freq: Every day | ORAL | Status: DC
  Administered 2020-12-17 – 2020-12-18 (×2): 90 mg via ORAL
  Filled 2020-12-17 (×2): qty 1

## 2020-12-17 MED ORDER — ACETAMINOPHEN 650 MG RE SUPP: 650.0000 mg | Freq: Four times a day (QID) | RECTAL | Status: DC | PRN

## 2020-12-17 MED ORDER — SODIUM CHLORIDE 0.9% FLUSH
3.0000 mL | Freq: Two times a day (BID) | INTRAVENOUS | Status: DC
  Administered 2020-12-17 – 2020-12-18 (×3): 3 mL via INTRAVENOUS

## 2020-12-17 MED ORDER — FOLIC ACID 1 MG PO TABS
1.0000 mg | ORAL_TABLET | Freq: Every day | ORAL | Status: DC
  Administered 2020-12-17 – 2020-12-18 (×2): 1 mg via ORAL
  Filled 2020-12-17 (×2): qty 1

## 2020-12-17 NOTE — ED Notes (Addendum)
Pt sit to stand with one person assist to bedside commode. Pt had brown, liquid bowel movement. Pericare performed, brief applied, bed linen changed

## 2020-12-17 NOTE — ED Notes (Signed)
Patient transported to X-ray 

## 2020-12-17 NOTE — Progress Notes (Signed)
ANTICOAGULATION CONSULT NOTE - Initial Consult  Pharmacy Consult for heparin Indication: atrial fibrillation  Allergies  Allergen Reactions  . Beta Adrenergic Blockers Other (See Comments)    Hx of AV block. Should avoid    Patient Measurements:   Heparin Dosing Weight: 72.1kg  Vital Signs: Temp: 98.5 F (36.9 C) (04/30 0249) Temp Source: Oral (04/30 0249) BP: 109/94 (04/30 0515) Pulse Rate: 56 (04/30 0515)  Labs: Recent Labs    12/17/20 0230  HGB 12.0*  HCT 37.6*  PLT 167  CREATININE 1.37*    CrCl cannot be calculated (Unknown ideal weight.).   Medical History: Past Medical History:  Diagnosis Date  . AV BLOCK, 1ST DEGREE   . Carotid artery disease ([place])    a. s/p Left ECA followed by Dr. Donnetta Hutching  . Coronary artery disease   . Coronary artery disease involving coronary bypass graft of native heart with angina pectoris ([place])    a. LHC 12/2015  3vd Left Cx 100% with colaterals, and distally LAD occluded, DES to prox-mid RCA  b. 02/2017: repeat cath with patent RCA stent and stable CAD; 02/27/2017: Rota-PCI to Ost RI  . GERD (gastroesophageal reflux disease)   . HLD (hyperlipidemia)   . HTN (hypertension)   . NSVT (nonsustained ventricular tachycardia) ([place])    during stress test 12/2015  . Presence of drug coated stent in RCA & Ramus Intermedius. 02/28/2017   12/2015: PCI p-mRCA - Stent Resolute Integ 3.5x34 02/2017: Rota-PCI o-mRamus - overlapping DES STENT SYNERGY DES 3X32  & 3x24 (tapered post-dilation)  . Stroke [facility])       Assessment: 87 yo male presents with AMS, cough, fever, hx including CAD.  Pharmacy consulted to dose heparin for AFib, no prior AC noted.  Hgb 12, Plts 167, Scr 1.37 Baseline labs ordered  Goal of Therapy:  Heparin level 0.3-0.7 units/ml Monitor platelets by anticoagulation protocol:   Plan:  Heparin bolus 3000 units x 1 Start heparin drip at 1000 units/hr Heparin level in 8 hours Daily CBC  Dolly Rias RPh 12/17/2020, 6:34  AM

## 2020-12-17 NOTE — Progress Notes (Signed)
   12/17/20 1546  Vitals  Temp 98.5 F (36.9 C)  Temp Source Oral  BP 122/62  MAP (mmHg) 77  BP Location Left Arm  BP Method Automatic  Patient Position (if appropriate) Lying  Pulse Rate (!) 46  Resp 20  MEWS COLOR  MEWS Score Color Green  Oxygen Therapy  SpO2 92 %  O2 Device Room Air  Pain Assessment  Pain Scale 0-10  Pain Score 0  Arrived to 1429. Patient alert and oriented x 4 , but hard of hearing. Patient was able to slide over on the bed without assistance . Patient was placed on telemetry, noted to be in afib on the monitor in the lower 30s-40's. Patient able to follow commands to squeeze hands, skin swarm performed with ER RN Vicente Males, no skin issues noted. Placed on airborne/contact precautions, oriented to room and surroundings, and was provided with the call bell. Patient endorses he "drinks about a pint of beer a day, tremor noted to bilateral hands, patient endorses he has had a tremor since he had stents placed, a while back."Patient denied having any pain, or any further needs at this time.

## 2020-12-17 NOTE — ED Provider Notes (Signed)
[facility] DEPT Provider Note   CSN: [id number] Arrival date & time: 12/17/20  0139     History Chief Complaint  Patient presents with  . Cough  . Fever   Level 5 caveat due to altered mental status [name] is a 87 y.o. male.  The history is provided by the patient and a relative. The history is limited by the condition of the patient.  Cough Severity:  Moderate Onset quality:  Gradual Duration:  3 days Timing:  Intermittent Progression:  Worsening Chronicity:  New Relieved by:  Nothing Worsened by:  Nothing Associated symptoms: fever   Fever Associated symptoms: cough    Patient with extensive history including CAD presents with cough, fever and confusion.  Per report patient has not been compliant medications.  The cough has been present for about 3 days   Patient reports he had a recent fall, may have injured his left hip.  Denies any head injury or LOC Past Medical History:  Diagnosis Date  . AV BLOCK, 1ST DEGREE   . Carotid artery disease ([place])    a. s/p Left ECA followed by Dr. Donnetta Hutching  . Coronary artery disease   . Coronary artery disease involving coronary bypass graft of native heart with angina pectoris ([place])    a. LHC 12/2015  3vd Left Cx 100% with colaterals, and distally LAD occluded, DES to prox-mid RCA  b. 02/2017: repeat cath with patent RCA stent and stable CAD; 02/27/2017: Rota-PCI to Ost RI  . GERD (gastroesophageal reflux disease)   . HLD (hyperlipidemia)   . HTN (hypertension)   . NSVT (nonsustained ventricular tachycardia) ([place])    during stress test 12/2015  . Presence of drug coated stent in RCA & Ramus Intermedius. 02/28/2017   12/2015: PCI p-mRCA - Stent Resolute Integ 3.5x34 02/2017: Rota-PCI o-mRamus - overlapping DES STENT SYNERGY DES 3X32  & 3x24 (tapered post-dilation)  . Stroke [facility])     Patient Active Problem List   Diagnosis Date Noted  . Laryngitis 04/17/2017  . Cough 03/29/2017  . Acute bronchitis  03/29/2017  . Acute upper respiratory infection 03/29/2017  . Presence of drug coated stent in RCA & Ramus Intermedius. 02/28/2017  . Gout flare 02/22/2017  . Coronary artery disease involving native coronary artery of native heart with angina pectoris ([place])   . Carotid artery disease ([place])   . HLD (hyperlipidemia)   . Unstable angina ([place]) 02/20/2017  . Atherosclerosis of native coronary artery of native heart   . Essential hypertension 01/06/2009  . Second degree atrioventricular block, Mobitz type I 01/06/2009    Past Surgical History:  Procedure Laterality Date  . CARDIAC CATHETERIZATION     x2  . CARDIAC CATHETERIZATION N/A 01/11/2016   Procedure: Left Heart Cath and Coronary Angiography;  Surgeon: Wellington Hampshire, MD;  Location: [facility];  Service: Cardiovascular;  Laterality: N/A;  . CAROTID ENDARTERECTOMY  11/28/2006   left  . CORONARY ATHERECTOMY N/A 02/27/2017   Procedure: Coronary Atherectomy;  Surgeon: Troy Sine, MD;  Location: [facility];  Service: Cardiovascular;  Laterality: N/A;  . HERNIA REPAIR    . LEFT HEART CATH AND CORONARY ANGIOGRAPHY N/A 02/21/2017   Procedure: Left Heart Cath and Coronary Angiography;  Surgeon: Lorretta Harp, MD;  Location: [facility];  Service: Cardiovascular;  Laterality: N/A;  . TONSILLECTOMY         Family History  Problem Relation Age of Onset  . Diabetes Mother   .  Diabetes Sister   . Cancer Sister 6       colon  . Heart disease Sister     Social History   Tobacco Use  . Smoking status: Never Smoker  . Smokeless tobacco: Never Used  Vaping Use  . Vaping Use: Never used  Substance Use Topics  . Alcohol use: Yes    Alcohol/week: 15.0 standard drinks    Types: 15 Glasses of wine per week  . Drug use: No    Home Medications Prior to Admission medications   Medication Sig Start Date End Date Taking? Authorizing Provider  acetaminophen (TYLENOL) 500 MG tablet Take 1 tablet (500 mg total)  by mouth every 6 (six) hours as needed for moderate pain (wrist). 02/28/17   Cheryln Manly, NP  amLODipine (NORVASC) 10 MG tablet TAKE 1 TABLET DAILY Patient taking differently: Take 10 mg by mouth daily. 12/31/18   Josue Hector, MD  aspirin EC 81 MG EC tablet Take 1 tablet (81 mg total) by mouth daily. 01/12/16   Cheryln Manly, NP  b complex vitamins tablet Take 1 tablet by mouth daily.    [provider]  Calcium Carbonate (CALCIUM 500 PO) Take 1 tablet by mouth daily.    [provider]  Cholecalciferol (VITAMIN D-3) 1000 units CAPS Take 1 capsule by mouth daily.    [provider]  clopidogrel (PLAVIX) 75 MG tablet Take 1 tablet (75 mg total) by mouth daily. 04/26/20   Josue Hector, MD  Glucosamine 500 MG CAPS Take 1 capsule by mouth daily.     [provider]  HYDROcodone-acetaminophen (NORCO/VICODIN) 5-325 MG tablet Take 1 tablet by mouth every 4 (four) hours as needed. 11/23/19   Jacqlyn Larsen, PA-C  isosorbide mononitrate (IMDUR) 60 MG 24 hr tablet TAKE ONE AND ONE-HALF TABLETS DAILY Patient taking differently: Take 90 mg by mouth daily. 12/31/18   Josue Hector, MD  lisinopril (ZESTRIL) 40 MG tablet TAKE 1 TABLET DAILY 06/24/20   Josue Hector, MD  Multiple Vitamin (MULTIVITAMIN) capsule Take 1 capsule by mouth daily.    [provider]  mupirocin ointment (BACTROBAN) 2 % Apply 1 application topically 3 (three) times daily. Lower left leg 09/11/19   [provider]  nitroGLYCERIN (NITROSTAT) 0.4 MG SL tablet PLACE 1 TABLET UNDER THE TONGUE EVERY 5 MINUTES AS NEEDED FOR CHEST PAIN. NOT TO EXCEED 3 DOSES 06/02/20   Josue Hector, MD  Omega-3 Fatty Acids (FISH OIL) 1000 MG CAPS Take 1 capsule by mouth daily.    [provider]  ranolazine (RANEXA) 1000 MG SR tablet TAKE 1 TABLET TWICE A DAY 06/24/20   Josue Hector, MD  simvastatin (ZOCOR) 20 MG tablet TAKE 1 TABLET DAILY 03/03/20   Josue Hector, MD  vitamin C  (ASCORBIC ACID) 500 MG tablet Take 500 mg by mouth daily.    [provider]    Allergies    Beta adrenergic blockers  Review of Systems   Review of Systems  Unable to perform ROS: Mental status change  Constitutional: Positive for fever.  Respiratory: Positive for cough.     Physical Exam Updated Vital Signs BP (!) 157/70   Pulse (!) 54   Temp 98.5 F (36.9 C) (Oral)   Resp 20   SpO2 99%   Physical Exam CONSTITUTIONAL: Elderly and frail HEAD: Normocephalic/atraumatic, no bruising or signs of trauma EYES: EOMI/PERRL ENMT: Mucous membranes moist NECK: supple no meningeal signs SPINE/BACK:entire spine  nontender, no bruising/crepitance/stepoffs noted to spine CV: S1/S2 noted LUNGS: Coarse breath sounds bilaterally, no acute distress ABDOMEN: soft, nontender, no rebound or guarding, bowel sounds noted throughout abdomen GU:no cva tenderness NEURO: Pt is awake/alert moves all extremitiesx4.  No facial droop.   EXTREMITIES: pulses normal/equal, full ROM, mild tenderness of range of motion left hip, but no deformities.  Pelvis stable.  All other extremities/joints palpated/ranged and nontender SKIN: warm, color normal PSYCH: Unable to assess ED Results / Procedures / Treatments   Labs (all labs ordered are listed, but only abnormal results are displayed) Labs Reviewed  COMPREHENSIVE METABOLIC PANEL - Abnormal; Notable for the following components:      Result Value   CO2 17 (*)    BUN 24 (*)    Creatinine, Ser 1.37 (*)    GFR, Estimated 49 (*)    All other components within normal limits  CBC WITH DIFFERENTIAL/PLATELET - Abnormal; Notable for the following components:   RBC 3.85 (*)    Hemoglobin 12.0 (*)    HCT 37.6 (*)    Monocytes Absolute 1.2 (*)    All other components within normal limits  URINALYSIS, ROUTINE W REFLEX MICROSCOPIC - Abnormal; Notable for the following components:   Ketones, ur 5 (*)    All other components within normal limits  RESP  PANEL BY RT-PCR (FLU A&B, COVID) ARPGX2  LACTIC ACID, PLASMA    EKG EKG Interpretation  Date/Time:  Saturday December 17 2020 04:41:32 EDT Ventricular Rate:  54 PR Interval:    QRS Duration: 99 QT Interval:  472 QTC Calculation: 448 R Axis:   14 Text Interpretation: Atrial fibrillation Probable anteroseptal infarct, old Minimal ST depression, anterolateral leads Confirmed by Ripley Fraise [phone]) on 12/17/2020 4:56:52 AM   Radiology DG Chest 2 View  Result Date: 12/17/2020 CLINICAL DATA:  Fever and cough. EXAM: CHEST - 2 VIEW COMPARISON:  April 04, 2017 FINDINGS: Mild, ill-defined multifocal areas of atelectasis and/or infiltrate are seen within the mid and lower lung fields bilaterally. There is no evidence of a pleural effusion or pneumothorax. The heart size and mediastinal contours are within normal limits. There is marked severity calcification of the aortic arch. The visualized skeletal structures are unremarkable. IMPRESSION: Mild, ill-defined multifocal atelectasis and/or infiltrate. Correlation with chest CT is recommended. Electronically Signed   By: Virgina Norfolk M.D.   On: 12/17/2020 04:31   DG HIP UNILAT WITH PELVIS 2-3 VIEWS LEFT  Result Date: 12/17/2020 CLINICAL DATA:  Left hip pain. EXAM: DG HIP (WITH OR WITHOUT PELVIS) 2-3V LEFT COMPARISON:  November 11, 2020 FINDINGS: There is no evidence of hip fracture or dislocation. Marked severity degenerative changes seen involving the left hip in the form of joint space narrowing and acetabular sclerosis. Marked severity vascular calcification is seen. IMPRESSION: Marked severity degenerative changes without an acute osseous abnormality. Electronically Signed   By: Virgina Norfolk M.D.   On: 12/17/2020 04:34    Procedures Procedures   Medications Ordered in ED Medications  cefTRIAXone (ROCEPHIN) 1 g in sodium chloride 0.9 % 100 mL IVPB (has no administration in time range)  azithromycin (ZITHROMAX) tablet 500 mg (has no  administration in time range)    ED Course  I have reviewed the triage vital signs and the nursing notes.  Pertinent labs & imaging results that were available during my care of the patient were reviewed by me and considered in my medical decision making (see chart for details).    MDM Rules/Calculators/A&P  Daughter is at bedside.  She reports patient did fall as he reported his left hip gave out he was on the floor for approximately 4 hours.  He has had a increasing cough recently.  He is not on home oxygen.  He has had some mild confusion but that is improving.  Imaging is pending at this time 5:24 AM I discussed x-ray findings with on-call radiologist Dr. Marguerita Merles He reports this is likely multifocal pneumonia.  Should also rule out COVID Patient has a new oxygen requirement, room air pulse ox is around 90% Patient will be admitted for new onset pneumonia.  Patient also appears to be in atrial fibrillation which is a new diagnosis as far as I can tell.  From his recent fall, there is no signs of acute traumatic injury.  Patient is awake and alert, no signs of any head trauma 5:46 AM Discussed with Dr. Hal Hope for admission  This patients CHA2DS2-VASc Score and unadjusted Ischemic Stroke Rate (% per year) is equal to 9.7 % stroke rate/year from a score of 6  Above score calculated as 1 point each if present [CHF, HTN, DM, Vascular=MI/PAD/Aortic Plaque, Age if 65-74, or Male] Above score calculated as 2 points each if present [Age > 75, or Stroke/TIA/TE]   Final Clinical Impression(s) / ED Diagnoses Final diagnoses:  Community acquired pneumonia, unspecified laterality  Atrial fibrillation, unspecified type ([place])  Acute respiratory failure with hypoxia [facility])    Rx / [place] Orders ED Discharge Orders    None       Ripley Fraise, MD 12/17/20 [phone]

## 2020-12-17 NOTE — Progress Notes (Signed)
PT Cancellation Note  Patient Details Name: [name] MRN: [id number] DOB: Aug 06, 1928   Cancelled Treatment:    Reason Eval/Treat Not Completed: Patient not medically ready, on heparin, Afib/ RVR, + for Covid. Will check back tomorrow.   Claretha Cooper 12/17/2020, 2:04 PM [place] Pager [phone] Office [phone]

## 2020-12-17 NOTE — Progress Notes (Signed)
[facility] for heparin>>LMWH Indication: atrial fibrillation  Allergies  Allergen Reactions  . Beta Adrenergic Blockers Other (See Comments)    Hx of AV block. Should avoid    Patient Measurements: Weight: 72.1 kg (159 lb) Heparin Dosing Weight: 72.1kg  Vital Signs: Temp: 98.8 F (37.1 C) (04/30 1400) Temp Source: Oral (04/30 1400) BP: 105/65 (04/30 1400) Pulse Rate: 61 (04/30 1400)  Labs: Recent Labs    12/17/20 0230 12/17/20 0623 12/17/20 0639 12/17/20 0800  HGB 12.0*  --   --   --   HCT 37.6*  --   --   --   PLT 167  --   --   --   APTT  --   --  35  --   LABPROT  --   --  14.3  --   INR  --   --  1.1  --   CREATININE 1.37*  --   --   --   CKTOTAL  --  95  --   --   TROPONINIHS  --   --   --  47*    Estimated Creatinine Clearance: 31.7 mL/min (A) (by C-G formula based on SCr of 1.37 mg/dL (H)).   Medical History: Past Medical History:  Diagnosis Date  . AV BLOCK, 1ST DEGREE   . Carotid artery disease ([place])    a. s/p Left ECA followed by Dr. Donnetta Hutching  . Coronary artery disease   . Coronary artery disease involving coronary bypass graft of native heart with angina pectoris ([place])    a. LHC 12/2015  3vd Left Cx 100% with colaterals, and distally LAD occluded, DES to prox-mid RCA  b. 02/2017: repeat cath with patent RCA stent and stable CAD; 02/27/2017: Rota-PCI to Ost RI  . GERD (gastroesophageal reflux disease)   . HLD (hyperlipidemia)   . HTN (hypertension)   . NSVT (nonsustained ventricular tachycardia) ([place])    during stress test 12/2015  . Presence of drug coated stent in RCA & Ramus Intermedius. 02/28/2017   12/2015: PCI p-mRCA - Stent Resolute Integ 3.5x34 02/2017: Rota-PCI o-mRamus - overlapping DES STENT SYNERGY DES 3X32  & 3x24 (tapered post-dilation)  . Stroke [facility])       Assessment: 87 yo male presents with AMS, cough, fever, hx including CAD.  Pharmacy consulted to dose heparin for AFib, no prior AC noted. Now  pharmacy consulted to dose LMWH for Afib  Hgb 12, Plts 167, Scr 1.37, CrCl 31.7 ml/min APTT 35; INR 1.1 WT on 11/21/20 = 72.1 kg Heparin 3000 unit bolus given @ 8 am & drip 1000 units/hr stated at 8 am> now changing to LMWH  Goal of Therapy:  Heparin level 0.3-0.7 units/ml Monitor platelets by anticoagulation protocol:   Plan:  LMWH 1 mg/kg SQ q12h F/u renal function, close to cut off to decrease to q24 interval F/u CBC   Eudelia Bunch, Pharm.D 12/17/2020 3:32 PM

## 2020-12-17 NOTE — ED Notes (Signed)
Pt aware we want sample of sputum, pt states "I haven't been coughing up very much for a sample"

## 2020-12-17 NOTE — Plan of Care (Signed)

## 2020-12-17 NOTE — Progress Notes (Addendum)
PROGRESS NOTE  [name] RCV:[id number] DOB: 1928-08-12 DOA: 12/17/2020 PCP: [name], MD  Brief History   30yom PMH CAD, SSS presented after fall at home, found on floor, cough, fever, chills for 3 days. Admitted for multifocal pneumonia  A & P  COVID multifocal pneumonia -- No hypoxia currently but SPO2 recorded in the low 90s by EDP. --Steroids and remdesivir. --Procalcitonin negative, no reason to suspect bacterial pneumonia at this time.  Stop antibiotics and monitor. --Remdesivir 4/30 > -- Steroids 4/30 > -- Immunomodulator not indicated --Inflammatory markers noted  Atrial fibrillation, slow ventricular response, new dx --SVR. Avoid negative inotropes given hx of condition disease.  --short-term anticoagulation given COVID, however not clearly a long-term candidate. Change to enoxaparin. --TSH WNL. Check echo.  CAD s/p stent; SSS --Asymptomatic, continue ASA, clopidogrel, statin  CKD stage IIIa --appears to be at baseline  Anemia of CKD --stable  Daily alcohol use --monitor for withdrawal  Disposition Plan:  Discussion:   Dispo: The patient is from: Home              Anticipated d/c is to: Home              Patient currently is not medically stable to d/c.   Difficult to place patient No  DVT prophylaxis: enoxparin   Code Status: Full Code Level of care: Telemetry Family Communication: none  Murray Hodgkins, MD  [facility] Direct contact: see [email or web address] (further directions at bottom of note if needed) 7PM-7AM contact night coverage as at bottom of note 12/17/2020, 3:25 PM  LOS: 0 days   Significant Hospital Events   . 4/29 admit for COVID, afib   Consults:  . Cardiology    Procedures:  . None  Significant Diagnostic Tests:  . 4/29 CXR multifocal infiltrate . 4/29 left hip film negative   Micro Data:  . COVID+   Antimicrobials:  . Ceftriaxone 4/29 > . Azithromycin 4/29 >  Interval History/Subjective  CC: f/u fall  Pain  affecting left hip.  Breathing okay.  Some cough.  Tolerating diet.  Objective   Vitals:  Vitals:   12/17/20 1310 12/17/20 1400  BP: (!) 155/128 105/65  Pulse: 63 61  Resp: (!) 24 16  Temp:  98.8 F (37.1 C)  SpO2: 97% 94%    Exam:  Constitutional:   . Appears calm and comfortable, eating lunch ENMT:  . Hard of hearing Respiratory:  . CTA bilaterally, no w/r/r, decreased breath sounds . Respiratory effort normal.  Cardiovascular:  . RRR, no m/r/g . No LE extremity edema   Abdomen:  . soft Psychiatric:  . Mental status o Mood, affect appropriate  I have personally reviewed the following:   Today's Data  . Creatinine stable 1.37 . Troponin mild elevation 47 . procalcitonin negative . CRP 4.2 . Ferritin 88 . ddimer 1.70  Scheduled Meds: . amLODipine  10 mg Oral Daily  . vitamin C  500 mg Oral Daily  . aspirin EC  81 mg Oral Daily  . clopidogrel  75 mg Oral Daily  . folic acid  1 mg Oral Daily  . isosorbide mononitrate  90 mg Oral Daily  . lisinopril  40 mg Oral Daily  . methylPREDNISolone (SOLU-MEDROL) injection  36 mg Intravenous Q12H   Followed by  . [START ON 12/20/2020] predniSONE  50 mg Oral Daily  . multivitamin with minerals  1 tablet Oral Daily  . ranolazine  1,000 mg Oral BID  . simvastatin  20 mg  Oral Daily  . sodium chloride flush  3 mL Intravenous Q12H  . thiamine  100 mg Oral Daily  . zinc sulfate  220 mg Oral Daily   Continuous Infusions: . [START ON 12/18/2020] remdesivir 100 mg in NS 100 mL      Principal Problem:   Pneumonia due to COVID-19 virus Active Problems:   Essential hypertension   Coronary artery disease involving native coronary artery of native heart with angina pectoris (HCC)   HLD (hyperlipidemia)   Multifocal pneumonia   New onset atrial fibrillation (HCC)   CKD (chronic kidney disease), stage III (HCC)   Anemia in chronic kidney disease (CKD)   LOS: 0 days   How to contact the [facility] Attending or Consulting provider  [place] or covering provider during after hours 7P -7A, for this patient?  1. Check the care team in [facility] and look for a) attending/consulting TR[facility] provider listed and b) the [place] team listed 2. Log into [email or web address] and use [place]'s universal password to access. If you do not have the password, please contact the hospital operator. 3. Locate the [facility] provider you are looking for under [facility] and page to a number that you can be directly reached. 4. If you still have difficulty reaching the provider, please page the [facility] (Director on Call) for the Hospitalists listed on amion for assistance.

## 2020-12-17 NOTE — ED Triage Notes (Signed)
Patient arrives from home. Per family, patient has had a cough and fever with new onset confusion. Patient has not been compliant with medications. Patient also had a fall at home, but did not hit head and no LOC. Patient has had a "junky" cough and fever x3 days. Patient also states increase in urinary frequency.

## 2020-12-17 NOTE — Progress Notes (Signed)
Received report from Elana Alm. Awaiting arrival of patient to 8.

## 2020-12-17 NOTE — H&P (Signed)
History and Physical    [name] [id number] DOB: Apr 24, 1928 DOA: 12/17/2020  PCP: [name], MD   Patient coming from: Home.  Chief Complaint: Fall.  HPI: [name] is a 87 y.o. male with history of CAD status post stenting, hypertension, hyperlipidemia, sick sinus syndrome, carotid endarterectomy was brought to the ER after patient had a fall at home.  Patient states he woke up in the middle of the night to go to the bathroom when he slipped and fell.  He did not hit his head or lose consciousness.  Was unable to get up from the fall and his daughter found him on the floor and called EMS.  In addition patient also states that he has been having productive cough and subjective feeling of fever chills for the last 3 days.  Denies chest pain.  Has had COVID vaccination boosters.  Patient has had recent steroid shot to his hip by Dr. Ninfa Linden 3 weeks ago.  ED Course: In the ER patient had a temperature of 99.9 F with chest x-ray showing multifocal pneumonia.  COVID test is still pending.  X-ray of the hip was unremarkable.  EKG shows bradycardia with possible A. fib.  Lab work show creatinine 1.3 and hemoglobin of 12 which are around the baseline.  Patient started on empiric antibiotics for pneumonia and admitted for further management.  Review of Systems: As per HPI, rest all negative.   Past Medical History:  Diagnosis Date  . AV BLOCK, 1ST DEGREE   . Carotid artery disease ([place])    a. s/p Left ECA followed by Dr. Donnetta Hutching  . Coronary artery disease   . Coronary artery disease involving coronary bypass graft of native heart with angina pectoris ([place])    a. LHC 12/2015  3vd Left Cx 100% with colaterals, and distally LAD occluded, DES to prox-mid RCA  b. 02/2017: repeat cath with patent RCA stent and stable CAD; 02/27/2017: Rota-PCI to Ost RI  . GERD (gastroesophageal reflux disease)   . HLD (hyperlipidemia)   . HTN (hypertension)   . NSVT (nonsustained ventricular tachycardia)  ([place])    during stress test 12/2015  . Presence of drug coated stent in RCA & Ramus Intermedius. 02/28/2017   12/2015: PCI p-mRCA - Stent Resolute Integ 3.5x34 02/2017: Rota-PCI o-mRamus - overlapping DES STENT SYNERGY DES 3X32  & 3x24 (tapered post-dilation)  . Stroke [facility])     Past Surgical History:  Procedure Laterality Date  . CARDIAC CATHETERIZATION     x2  . CARDIAC CATHETERIZATION N/A 01/11/2016   Procedure: Left Heart Cath and Coronary Angiography;  Surgeon: Wellington Hampshire, MD;  Location: [place] [facility];  Service: Cardiovascular;  Laterality: N/A;  . CAROTID ENDARTERECTOMY  11/28/2006   left  . CORONARY ATHERECTOMY N/A 02/27/2017   Procedure: Coronary Atherectomy;  Surgeon: Troy Sine, MD;  Location: [place] [facility];  Service: Cardiovascular;  Laterality: N/A;  . HERNIA REPAIR    . LEFT HEART CATH AND CORONARY ANGIOGRAPHY N/A 02/21/2017   Procedure: Left Heart Cath and Coronary Angiography;  Surgeon: Lorretta Harp, MD;  Location: [place] [facility];  Service: Cardiovascular;  Laterality: N/A;  . TONSILLECTOMY       reports that he has never smoked. He has never used smokeless tobacco. He reports current alcohol use of about 15.0 standard drinks of alcohol per week. He reports that he does not use drugs.  Allergies  Allergen Reactions  . Beta Adrenergic Blockers Other (See  Comments)    Hx of AV block. Should avoid    Family History  Problem Relation Age of Onset  . Diabetes Mother   . Diabetes Sister   . Cancer Sister 74       colon  . Heart disease Sister     Prior to Admission medications   Medication Sig Start Date End Date Taking? Authorizing Provider  acetaminophen (TYLENOL) 500 MG tablet Take 1 tablet (500 mg total) by mouth every 6 (six) hours as needed for moderate pain (wrist). 02/28/17   Cheryln Manly, NP  amLODipine (NORVASC) 10 MG tablet TAKE 1 TABLET DAILY Patient taking differently: Take 10 mg by mouth daily. 12/31/18   Josue Hector, MD   aspirin EC 81 MG EC tablet Take 1 tablet (81 mg total) by mouth daily. 01/12/16   Cheryln Manly, NP  b complex vitamins tablet Take 1 tablet by mouth daily.    [provider]  Calcium Carbonate (CALCIUM 500 PO) Take 1 tablet by mouth daily.    [provider]  Cholecalciferol (VITAMIN D-3) 1000 units CAPS Take 1 capsule by mouth daily.    [provider]  clopidogrel (PLAVIX) 75 MG tablet Take 1 tablet (75 mg total) by mouth daily. 04/26/20   Josue Hector, MD  Glucosamine 500 MG CAPS Take 1 capsule by mouth daily.     [provider]  HYDROcodone-acetaminophen (NORCO/VICODIN) 5-325 MG tablet Take 1 tablet by mouth every 4 (four) hours as needed. 11/23/19   Jacqlyn Larsen, PA-C  isosorbide mononitrate (IMDUR) 60 MG 24 hr tablet TAKE ONE AND ONE-HALF TABLETS DAILY Patient taking differently: Take 90 mg by mouth daily. 12/31/18   Josue Hector, MD  lisinopril (ZESTRIL) 40 MG tablet TAKE 1 TABLET DAILY 06/24/20   Josue Hector, MD  Multiple Vitamin (MULTIVITAMIN) capsule Take 1 capsule by mouth daily.    [provider]  mupirocin ointment (BACTROBAN) 2 % Apply 1 application topically 3 (three) times daily. Lower left leg 09/11/19   [provider]  nitroGLYCERIN (NITROSTAT) 0.4 MG SL tablet PLACE 1 TABLET UNDER THE TONGUE EVERY 5 MINUTES AS NEEDED FOR CHEST PAIN. NOT TO EXCEED 3 DOSES 06/02/20   Josue Hector, MD  Omega-3 Fatty Acids (FISH OIL) 1000 MG CAPS Take 1 capsule by mouth daily.    [provider]  ranolazine (RANEXA) 1000 MG SR tablet TAKE 1 TABLET TWICE A DAY 06/24/20   Josue Hector, MD  simvastatin (ZOCOR) 20 MG tablet TAKE 1 TABLET DAILY 03/03/20   Josue Hector, MD  vitamin C (ASCORBIC ACID) 500 MG tablet Take 500 mg by mouth daily.    [provider]    Physical Exam: Constitutional: Moderately built and nourished. Vitals:   12/17/20 0345 12/17/20 0430 12/17/20 0445 12/17/20 0515  BP: (!) 157/70 (!)  148/62 (!) 158/54 (!) 109/94  Pulse: (!) 54 (!) 56 (!) 53 (!) 56  Resp: 20 (!) 21 (!) 22 (!) 22  Temp:      TempSrc:      SpO2: 99% 91% 91% 94%   Eyes: Anicteric no pallor. ENMT: No discharge from the ears eyes nose or mouth. Neck: No mass felt.  No neck rigidity. Respiratory: No rhonchi or crepitations. Cardiovascular: S1-S2 heard. Abdomen: Soft nontender bowel sounds present. Musculoskeletal: No edema. Skin: No rash. Neurologic: Alert awake oriented to time place and person.  Moves all extremities. Psychiatric: Appears normal.  Normal affect.   Labs  on Admission: I have personally reviewed following labs and imaging studies  CBC: Recent Labs  Lab 12/17/20 0230  WBC 5.6  NEUTROABS 3.4  HGB 12.0*  HCT 37.6*  MCV 97.7  PLT 366   Basic Metabolic Panel: Recent Labs  Lab 12/17/20 0230  NA 137  K 4.5  CL 108  CO2 17*  GLUCOSE 97  BUN 24*  CREATININE 1.37*  CALCIUM 9.1   GFR: CrCl cannot be calculated (Unknown ideal weight.). Liver Function Tests: Recent Labs  Lab 12/17/20 0230  AST 26  ALT 17  ALKPHOS 67  BILITOT 0.8  PROT 7.5  ALBUMIN 4.3   No results for input(s): LIPASE, AMYLASE in the last 168 hours. No results for input(s): AMMONIA in the last 168 hours. Coagulation Profile: No results for input(s): INR, PROTIME in the last 168 hours. Cardiac Enzymes: No results for input(s): CKTOTAL, CKMB, CKMBINDEX, TROPONINI in the last 168 hours. BNP (last 3 results) No results for input(s): PROBNP in the last 8760 hours. HbA1C: No results for input(s): HGBA1C in the last 72 hours. CBG: No results for input(s): GLUCAP in the last 168 hours. Lipid Profile: No results for input(s): CHOL, HDL, LDLCALC, TRIG, CHOLHDL, LDLDIRECT in the last 72 hours. Thyroid Function Tests: No results for input(s): TSH, T4TOTAL, FREET4, T3FREE, THYROIDAB in the last 72 hours. Anemia Panel: No results for input(s): VITAMINB12, FOLATE, FERRITIN, TIBC, IRON, RETICCTPCT in the  last 72 hours. Urine analysis:    Component Value Date/Time   COLORURINE YELLOW 12/17/2020 0230   APPEARANCEUR CLEAR 12/17/2020 0230   LABSPEC 1.010 12/17/2020 0230   PHURINE 6.0 12/17/2020 0230   GLUCOSEU NEGATIVE 12/17/2020 0230   HGBUR NEGATIVE 12/17/2020 0230   BILIRUBINUR NEGATIVE 12/17/2020 0230   KETONESUR 5 (A) 12/17/2020 0230   PROTEINUR NEGATIVE 12/17/2020 0230   NITRITE NEGATIVE 12/17/2020 0230   LEUKOCYTESUR NEGATIVE 12/17/2020 0230   Sepsis Labs: @LABRCNTIP (procalcitonin:4,lacticidven:4) )No results found for this or any previous visit (from the past 240 hour(s)).   Radiological Exams on Admission: DG Chest 2 View  Result Date: 12/17/2020 CLINICAL DATA:  Fever and cough. EXAM: CHEST - 2 VIEW COMPARISON:  April 04, 2017 FINDINGS: Mild, ill-defined multifocal areas of atelectasis and/or infiltrate are seen within the mid and lower lung fields bilaterally. There is no evidence of a pleural effusion or pneumothorax. The heart size and mediastinal contours are within normal limits. There is marked severity calcification of the aortic arch. The visualized skeletal structures are unremarkable. IMPRESSION: Mild, ill-defined multifocal atelectasis and/or infiltrate. Correlation with chest CT is recommended. Electronically Signed   By: Virgina Norfolk M.D.   On: 12/17/2020 04:31   DG HIP UNILAT WITH PELVIS 2-3 VIEWS LEFT  Result Date: 12/17/2020 CLINICAL DATA:  Left hip pain. EXAM: DG HIP (WITH OR WITHOUT PELVIS) 2-3V LEFT COMPARISON:  November 11, 2020 FINDINGS: There is no evidence of hip fracture or dislocation. Marked severity degenerative changes seen involving the left hip in the form of joint space narrowing and acetabular sclerosis. Marked severity vascular calcification is seen. IMPRESSION: Marked severity degenerative changes without an acute osseous abnormality. Electronically Signed   By: Virgina Norfolk M.D.   On: 12/17/2020 04:34    EKG: Independently reviewed.  A.  fib heart rate around 52 bpm.  Assessment/Plan Principal Problem:   Multifocal pneumonia Active Problems:   Essential hypertension   Coronary artery disease involving native coronary artery of native heart with angina pectoris (HCC)   HLD (hyperlipidemia)   CAP (community acquired  pneumonia)    1. Multifocal pneumonia -COVID test is pending.  Patient has a productive cough so we will check sputum cultures check urine for Legionella strep antigen and presently on ceftriaxone and Zithromax. 2. Abnormal rhythm likely new onset A. fib.  Patient's CHA2DS2-VASc score is at least 3.  I have placed patient on heparin.  Patient is bradycardic at this time so no rate limiting medication.  I have requested cardiology consult for further input. 3. CAD status post stenting denies any chest pain.  On antiplatelet agents statins Imdur.  Not on beta-blockers due to bradycardia. 4. Chronic kidney disease stage III creatinine appears to be at baseline. 5. Anemia likely from renal disease hemoglobin at baseline. 6. Hypertension on lisinopril amlodipine and Imdur. 7. Hyperlipidemia on statin. 8. History of carotid endarterectomy. 9. Patient does drink beer every day.  Closely monitor for any withdrawal symptoms.  On thiamine for now.  COVID test is pending.   DVT prophylaxis: Heparin infusion. Code Status: Full code. Family Communication: Discussed with patient. Disposition Plan: Home. Consults called: Cardiology. Admission status: Observation   Rise Patience MD [facility] Pager [phone].  If 7PM-7AM, please contact night-coverage [email or web address] Password [facility]  12/17/2020, 6:28 AM

## 2020-12-17 NOTE — ED Notes (Signed)
Pt received a lunch tray when they came

## 2020-12-17 NOTE — Hospital Course (Addendum)
67yom PMH CAD, SSS presented after fall at home, found on floor, cough, fever, chills for 3 days. Admitted for multifocal pneumonia  A & P  COVID multifocal pneumonia -- No hypoxia currently but SPO2 recorded in the low 90s by EDP. --Steroids and remdesivir. --Procalcitonin negative, no reason to suspect bacterial pneumonia at this time.  Stop antibiotics and monitor. --Remdesivir 4/30 > -- Steroids 4/30 > -- Immunomodulator not indicated --Inflammatory markers noted  Atrial fibrillation, slow ventricular response, new dx --SVR. Avoid negative inotropes given hx of condition disease.  --short-term anticoagulation given COVID, however not clearly a long-term candidate. Change to enoxaparin. --TSH WNL. Check echo.  CAD s/p stent; SSS --Asymptomatic, continue ASA, clopidogrel, statin  CKD stage IIIa --appears to be at baseline  Anemia of CKD --stable  Daily alcohol use --monitor for withdrawal

## 2020-12-17 NOTE — Consult Note (Signed)
CONSULTATION NOTE   Patient Name: [name] Date of Encounter: 12/17/2020 Cardiologist: Jenkins Rouge, MD Electrophysiologist: None Advanced Heart Failure: None   Chief Complaint   Shortness of breath, fever  Patient Profile   87 yo male with shortness of breath, fever, found to have multifocal pneumonia and afib with slow ventricular response on EKG  HPI   [name] is a 87 y.o. male who is being seen today for the evaluation of afib at the request of Dr. Hal Hope. This is a 87 year old male with history of coronary artery disease and peripheral vascular disease.  He had prior DES to the RCA in July 2018.  He has a history of AV block in the past on beta-blockers.  He also has bilateral carotid artery disease hypertension and dyslipidemia.  He developed a right radial artery AV fistula post procedure without steal.  He presented with a fall at home and has recently had productive cough, fever chills and shortness of breath.  T-max in the ER was 99.9 chest x-ray suggested multifocal pneumonia - COVID 19 positive.  EKG was performed which shows atrial fibrillation with slow ventricular response.  This is a new diagnosis for him.  Cardiology is asked to consult. He is currently on IV Heparin.  PMHx   Past Medical History:  Diagnosis Date  . AV BLOCK, 1ST DEGREE   . Carotid artery disease ([place])    a. s/p Left ECA followed by Dr. Donnetta Hutching  . Coronary artery disease   . Coronary artery disease involving coronary bypass graft of native heart with angina pectoris ([place])    a. LHC 12/2015  3vd Left Cx 100% with colaterals, and distally LAD occluded, DES to prox-mid RCA  b. 02/2017: repeat cath with patent RCA stent and stable CAD; 02/27/2017: Rota-PCI to Ost RI  . GERD (gastroesophageal reflux disease)   . HLD (hyperlipidemia)   . HTN (hypertension)   . NSVT (nonsustained ventricular tachycardia) ([place])    during stress test 12/2015  . Presence of drug coated stent in RCA & Ramus  Intermedius. 02/28/2017   12/2015: PCI p-mRCA - Stent Resolute Integ 3.5x34 02/2017: Rota-PCI o-mRamus - overlapping DES STENT SYNERGY DES 3X32  & 3x24 (tapered post-dilation)  . Stroke [facility])     Past Surgical History:  Procedure Laterality Date  . CARDIAC CATHETERIZATION     x2  . CARDIAC CATHETERIZATION N/A 01/11/2016   Procedure: Left Heart Cath and Coronary Angiography;  Surgeon: Wellington Hampshire, MD;  Location: [place] [facility];  Service: Cardiovascular;  Laterality: N/A;  . CAROTID ENDARTERECTOMY  11/28/2006   left  . CORONARY ATHERECTOMY N/A 02/27/2017   Procedure: Coronary Atherectomy;  Surgeon: Troy Sine, MD;  Location: [place] [facility];  Service: Cardiovascular;  Laterality: N/A;  . HERNIA REPAIR    . LEFT HEART CATH AND CORONARY ANGIOGRAPHY N/A 02/21/2017   Procedure: Left Heart Cath and Coronary Angiography;  Surgeon: Lorretta Harp, MD;  Location: [place] [facility];  Service: Cardiovascular;  Laterality: N/A;  . TONSILLECTOMY      FAMHx   Family History  Problem Relation Age of Onset  . Diabetes Mother   . Diabetes Sister   . Cancer Sister 83       colon  . Heart disease Sister     SOCHx    reports that he has never smoked. He has never used smokeless tobacco. He reports current alcohol use of about 15.0 standard drinks of alcohol per week. He  reports that he does not use drugs.  Outpatient Medications   No current facility-administered medications on file prior to encounter.   Current Outpatient Medications on File Prior to Encounter  Medication Sig Dispense Refill  . acetaminophen (TYLENOL) 500 MG tablet Take 1 tablet (500 mg total) by mouth every 6 (six) hours as needed for moderate pain (wrist). 30 tablet 0  . amLODipine (NORVASC) 10 MG tablet TAKE 1 TABLET DAILY (Patient taking differently: Take 10 mg by mouth daily.) 90 tablet 0  . aspirin EC 81 MG EC tablet Take 1 tablet (81 mg total) by mouth daily.    Marland Kitchen b complex vitamins tablet Take 1 tablet  by mouth daily.    . Calcium Carbonate (CALCIUM 500 PO) Take 1 tablet by mouth daily.    . Cholecalciferol (VITAMIN D-3) 1000 units CAPS Take 1 capsule by mouth daily.    . clopidogrel (PLAVIX) 75 MG tablet Take 1 tablet (75 mg total) by mouth daily. 90 tablet 3  . Glucosamine 500 MG CAPS Take 1 capsule by mouth daily.     Marland Kitchen HYDROcodone-acetaminophen (NORCO/VICODIN) 5-325 MG tablet Take 1 tablet by mouth every 4 (four) hours as needed. 10 tablet 0  . isosorbide mononitrate (IMDUR) 60 MG 24 hr tablet TAKE ONE AND ONE-HALF TABLETS DAILY (Patient taking differently: Take 90 mg by mouth daily.) 135 tablet 0  . lisinopril (ZESTRIL) 40 MG tablet TAKE 1 TABLET DAILY 90 tablet 3  . Multiple Vitamin (MULTIVITAMIN) capsule Take 1 capsule by mouth daily.    . mupirocin ointment (BACTROBAN) 2 % Apply 1 application topically 3 (three) times daily. Lower left leg    . nitroGLYCERIN (NITROSTAT) 0.4 MG SL tablet PLACE 1 TABLET UNDER THE TONGUE EVERY 5 MINUTES AS NEEDED FOR CHEST PAIN. NOT TO EXCEED 3 DOSES 25 tablet 6  . Omega-3 Fatty Acids (FISH OIL) 1000 MG CAPS Take 1 capsule by mouth daily.    . ranolazine (RANEXA) 1000 MG SR tablet TAKE 1 TABLET TWICE A DAY 180 tablet 3  . simvastatin (ZOCOR) 20 MG tablet TAKE 1 TABLET DAILY 90 tablet 3  . vitamin C (ASCORBIC ACID) 500 MG tablet Take 500 mg by mouth daily.      Inpatient Medications    Scheduled Meds: . amLODipine  10 mg Oral Daily  . aspirin EC  81 mg Oral Daily  . clopidogrel  75 mg Oral Daily  . isosorbide mononitrate  90 mg Oral Daily  . lisinopril  40 mg Oral Daily  . ranolazine  1,000 mg Oral BID  . simvastatin  20 mg Oral Daily  . thiamine  100 mg Oral Daily    Continuous Infusions: . [START ON 12/18/2020] azithromycin    . [START ON 12/18/2020] cefTRIAXone (ROCEPHIN)  IV    . heparin 1,000 Units/hr (12/17/20 0802)    PRN Meds: acetaminophen **OR** acetaminophen, nitroGLYCERIN   ALLERGIES   Allergies  Allergen Reactions  . Beta  Adrenergic Blockers Other (See Comments)    Hx of AV block. Should avoid    ROS   Pertinent items noted in HPI and remainder of comprehensive ROS otherwise negative.  Vitals   Vitals:   12/17/20 0630 12/17/20 0645 12/17/20 0700 12/17/20 0715  BP: (!) 145/58 (!) 149/59 (!) 129/55 (!) 134/47  Pulse: (!) 52 (!) 52 (!) 48 (!) 47  Resp: (!) 27 20 16 17   Temp:      TempSrc:      SpO2: 98% 96% 94% 96%  No intake or output data in the 24 hours ending 12/17/20 [id number] There were no vitals filed for this visit.  Physical Exam   General appearance: alert, appears older than stated age and no distress Neck: no carotid bruit, no JVD and thyroid not enlarged, symmetric, no tenderness/mass/nodules Lungs: diminished breath sounds bilaterally and rhonchi bilaterally Heart: irregularly irregular rhythm and Bradycardic Abdomen: soft, non-tender; bowel sounds normal; no masses,  no organomegaly Extremities: extremities normal, atraumatic, no cyanosis or edema Pulses: 2+ and symmetric Skin: Skin color, texture, turgor normal. No rashes or lesions Neurologic: Grossly normal Psych: Pleasant  Labs   Results for orders placed or performed during the hospital encounter of 12/17/20 (from the past 48 hour(s))  Lactic acid, plasma     Status: None   Collection Time: 12/17/20  2:30 AM  Result Value Ref Range   Lactic Acid, Venous 1.5 0.5 - 1.9 mmol/L    Comment: Performed at [facility], [place] [address], [place], [place] [address]  Comprehensive metabolic panel     Status: Abnormal   Collection Time: 12/17/20  2:30 AM  Result Value Ref Range   Sodium 137 135 - 145 mmol/L   Potassium 4.5 3.5 - 5.1 mmol/L   Chloride 108 98 - 111 mmol/L   CO2 17 (L) 22 - 32 mmol/L   Glucose, Bld 97 70 - 99 mg/dL    Comment: Glucose reference range applies only to samples taken after fasting for at least 8 hours.   BUN 24 (H) 8 - 23 mg/dL   Creatinine, Ser 1.37 (H) 0.61 - 1.24 mg/dL   Calcium 9.1  8.9 - 10.3 mg/dL   Total Protein 7.5 6.5 - 8.1 g/dL   Albumin 4.3 3.5 - 5.0 g/dL   AST 26 15 - 41 U/L   ALT 17 0 - 44 U/L   Alkaline Phosphatase 67 38 - 126 U/L   Total Bilirubin 0.8 0.3 - 1.2 mg/dL   GFR, Estimated 49 (L) >60 mL/min    Comment: (NOTE) Calculated using the CKD-EPI Creatinine Equation (2021)    Anion gap 12 5 - 15    Comment: Performed at [place], [place] [address] [place], [place] [address]  CBC with Differential     Status: Abnormal   Collection Time: 12/17/20  2:30 AM  Result Value Ref Range   WBC 5.6 4.0 - 10.5 K/uL   RBC 3.85 (L) 4.22 - 5.81 MIL/uL   Hemoglobin 12.0 (L) 13.0 - 17.0 g/dL   HCT 37.6 (L) 39.0 - 52.0 %   MCV 97.7 80.0 - 100.0 fL   MCH 31.2 26.0 - 34.0 pg   MCHC 31.9 30.0 - 36.0 g/dL   RDW 15.2 11.5 - 15.5 %   Platelets 167 150 - 400 K/uL   nRBC 0.0 0.0 - 0.2 %   Neutrophils Relative % 61 %   Neutro Abs 3.4 1.7 - 7.7 K/uL   Lymphocytes Relative 17 %   Lymphs Abs 1.0 0.7 - 4.0 K/uL   Monocytes Relative 21 %   Monocytes Absolute 1.2 (H) 0.1 - 1.0 K/uL   Eosinophils Relative 0 %   Eosinophils Absolute 0.0 0.0 - 0.5 K/uL   Basophils Relative 0 %   Basophils Absolute 0.0 0.0 - 0.1 K/uL   Immature Granulocytes 1 %   Abs Immature Granulocytes 0.03 0.00 - 0.07 K/uL    Comment: Performed at [facility], [place] [address], [place], [place] [address]  Urinalysis, Routine  w reflex microscopic Urine, Clean Catch     Status: Abnormal   Collection Time: 12/17/20  2:30 AM  Result Value Ref Range   Color, Urine YELLOW YELLOW   APPearance CLEAR CLEAR   Specific Gravity, Urine 1.010 1.005 - 1.030   pH 6.0 5.0 - 8.0   Glucose, UA NEGATIVE NEGATIVE mg/dL   Hgb urine dipstick NEGATIVE NEGATIVE   Bilirubin Urine NEGATIVE NEGATIVE   Ketones, ur 5 (A) NEGATIVE mg/dL   Protein, ur NEGATIVE NEGATIVE mg/dL   Nitrite NEGATIVE NEGATIVE   Leukocytes,Ua NEGATIVE NEGATIVE    Comment: Performed at [place] [address] [place], [place] [address]  Resp Panel by RT-PCR (Flu A&B, Covid) Nasopharyngeal Swab     Status: Abnormal   Collection Time: 12/17/20  4:24 AM   Specimen: Nasopharyngeal Swab; Nasopharyngeal(NP) swabs in vial transport medium  Result Value Ref Range   SARS Coronavirus 2 by RT PCR POSITIVE (A) NEGATIVE    Comment: RESULT CALLED TO, READ BACK BY AND VERIFIED WITH: Assunta Curtis 12/17/20 @0642  BY SEEL,M (NOTE) SARS-CoV-2 target nucleic acids are DETECTED.  The SARS-CoV-2 RNA is generally detectable in upper respiratory specimens during the acute phase of infection. Positive results are indicative of the presence of the identified virus, but do not rule out bacterial infection or co-infection with other pathogens not detected by the test. Clinical correlation with patient history and other diagnostic information is necessary to determine patient infection status. The expected result is Negative.  Fact Sheet for Patients: EntrepreneurPulse.com.au  Fact Sheet for Healthcare Providers: IncredibleEmployment.be  This test is not yet approved or cleared by the [place] FDA and  has been authorized for detection and/or diagnosis of SARS-CoV-2 by FDA under an Emergency Use Authorization (EUA).  This EUA will remain in effect (meaning this test can b e used) for the duration of  the COVID-19 declaration under Section 564(b)(1) of the Act, 21 U.S.C. section 360bbb-3(b)(1), unless the authorization is terminated or revoked sooner.     Influenza A by PCR NEGATIVE NEGATIVE   Influenza B by PCR NEGATIVE NEGATIVE    Comment: (NOTE) The Xpert Xpress SARS-CoV-2/FLU/RSV plus assay is intended as an aid in the diagnosis of influenza from Nasopharyngeal swab specimens and should not be used as a sole basis for treatment. Nasal washings and aspirates are unacceptable for Xpert Xpress SARS-CoV-2/FLU/RSV testing.  Fact Sheet for  Patients: EntrepreneurPulse.com.au  Fact Sheet for Healthcare Providers: IncredibleEmployment.be  This test is not yet approved or cleared by the [place] FDA and has been authorized for detection and/or diagnosis of SARS-CoV-2 by FDA under an Emergency Use Authorization (EUA). This EUA will remain in effect (meaning this test can be used) for the duration of the COVID-19 declaration under Section 564(b)(1) of the Act, 21 U.S.C. section 360bbb-3(b)(1), unless the authorization is terminated or revoked.  Performed at [facility], [place] [address] [place], [place] [address]   CK     Status: None   Collection Time: 12/17/20  6:23 AM  Result Value Ref Range   Total CK 95 49 - 397 U/L    Comment: Performed at [facility], [place] [address], [place], [place] [address]  APTT     Status: None   Collection Time: 12/17/20  6:39 AM  Result Value Ref Range   aPTT 35 24 - 36 seconds    Comment: Performed at [facility], [place] [address] [place], [place] [address]  Protime-INR  Status: None   Collection Time: 12/17/20  6:39 AM  Result Value Ref Range   Prothrombin Time 14.3 11.4 - 15.2 seconds   INR 1.1 0.8 - 1.2    Comment: (NOTE) INR goal varies based on device and disease states. Performed at [facility], [place] [address] [place], [place] [address]     ECG   AFib with slow ventricular response - Personally Reviewed  Telemetry   N/A  Radiology   DG Chest 2 View  Result Date: 12/17/2020 CLINICAL DATA:  Fever and cough. EXAM: CHEST - 2 VIEW COMPARISON:  April 04, 2017 FINDINGS: Mild, ill-defined multifocal areas of atelectasis and/or infiltrate are seen within the mid and lower lung fields bilaterally. There is no evidence of a pleural effusion or pneumothorax. The heart size and mediastinal contours are within normal limits. There is marked severity calcification  of the aortic arch. The visualized skeletal structures are unremarkable. IMPRESSION: Mild, ill-defined multifocal atelectasis and/or infiltrate. Correlation with chest CT is recommended. Electronically Signed   By: Virgina Norfolk M.D.   On: 12/17/2020 04:31   DG HIP UNILAT WITH PELVIS 2-3 VIEWS LEFT  Result Date: 12/17/2020 CLINICAL DATA:  Left hip pain. EXAM: DG HIP (WITH OR WITHOUT PELVIS) 2-3V LEFT COMPARISON:  November 11, 2020 FINDINGS: There is no evidence of hip fracture or dislocation. Marked severity degenerative changes seen involving the left hip in the form of joint space narrowing and acetabular sclerosis. Marked severity vascular calcification is seen. IMPRESSION: Marked severity degenerative changes without an acute osseous abnormality. Electronically Signed   By: Virgina Norfolk M.D.   On: 12/17/2020 04:34    Cardiac Studies   N/A  Impression   Principal Problem:   Pneumonia due to COVID-19 virus Active Problems:   Essential hypertension   Coronary artery disease involving native coronary artery of native heart with angina pectoris (HCC)   HLD (hyperlipidemia)   Multifocal pneumonia   Recommendation   1. AFib with SVR -Mr. Means has new onset A. fib with slow ventricular response.  He is on no negative inotropes given a history of conduction disease in the past.  I would avoid those obviously with his bradycardia.  He has been started on IV heparin.  I doubt he is a good long-term anticoagulation candidate given age and falls.  Given his COVID-19 pneumonia however anticoagulation in the short-term may be beneficial due to the coagulopathic nature of the virus. 2. COVID 19 pneumonia -recommend standard therapies per hospital medicine, consider antivirals if appropriate for time course. 3. CAD -no chest pain complaints, monitor 4. HTN -blood pressure is generally well controlled continue current medications 5. HLD -continue simvastatin  Thanks for the  consultation.  Time Spent Directly with Patient:  I have spent a total of 45 minutes with the patient reviewing hospital notes, telemetry, EKGs, labs and examining the patient as well as establishing an assessment and plan that was discussed personally with the patient.  > 50% of time was spent in direct patient care.  Length of Stay:  LOS: 0 days   Pixie Casino, MD, [facility], [place] Director of the Advanced Lipid Disorders &  Cardiovascular Risk Reduction Clinic Diplomate of the American Board of Clinical Lipidology Attending Cardiologist  Direct Dial: [phone]  Fax: [phone]  Website:  [email or web address]   Nadean Corwin Cherene Dobbins 12/17/2020, 8:22 AM

## 2020-12-17 NOTE — Progress Notes (Signed)
PT demonstrated hands on understanding of Flutter device. 

## 2020-12-18 ENCOUNTER — Other Ambulatory Visit (HOSPITAL_COMMUNITY): Payer: Medicare Other

## 2020-12-18 DIAGNOSIS — J9601 Acute respiratory failure with hypoxia: Secondary | ICD-10-CM

## 2020-12-18 DIAGNOSIS — J1282 Pneumonia due to coronavirus disease 2019: Secondary | ICD-10-CM | POA: Diagnosis not present

## 2020-12-18 DIAGNOSIS — I4891 Unspecified atrial fibrillation: Secondary | ICD-10-CM | POA: Diagnosis not present

## 2020-12-18 DIAGNOSIS — U071 COVID-19: Secondary | ICD-10-CM | POA: Diagnosis not present

## 2020-12-18 LAB — FERRITIN: Ferritin: 109 ng/mL (ref 24–336)

## 2020-12-18 LAB — CBC WITH DIFFERENTIAL/PLATELET
Abs Immature Granulocytes: 0 10*3/uL (ref 0.00–0.07)
Basophils Absolute: 0 10*3/uL (ref 0.0–0.1)
Basophils Relative: 0 %
Eosinophils Absolute: 0 10*3/uL (ref 0.0–0.5)
Eosinophils Relative: 0 %
HCT: 30.5 % — ABNORMAL LOW (ref 39.0–52.0)
Hemoglobin: 9.9 g/dL — ABNORMAL LOW (ref 13.0–17.0)
Immature Granulocytes: 0 %
Lymphocytes Relative: 33 %
Lymphs Abs: 1.1 10*3/uL (ref 0.7–4.0)
MCH: 31.2 pg (ref 26.0–34.0)
MCHC: 32.5 g/dL (ref 30.0–36.0)
MCV: 96.2 fL (ref 80.0–100.0)
Monocytes Absolute: 0.2 10*3/uL (ref 0.1–1.0)
Monocytes Relative: 6 %
Neutro Abs: 2 10*3/uL (ref 1.7–7.7)
Neutrophils Relative %: 61 %
Platelets: 144 10*3/uL — ABNORMAL LOW (ref 150–400)
RBC: 3.17 MIL/uL — ABNORMAL LOW (ref 4.22–5.81)
RDW: 14.9 % (ref 11.5–15.5)
WBC: 3.2 10*3/uL — ABNORMAL LOW (ref 4.0–10.5)
nRBC: 0 % (ref 0.0–0.2)

## 2020-12-18 LAB — COMPREHENSIVE METABOLIC PANEL
ALT: 17 U/L (ref 0–44)
AST: 28 U/L (ref 15–41)
Albumin: 3.3 g/dL — ABNORMAL LOW (ref 3.5–5.0)
Alkaline Phosphatase: 49 U/L (ref 38–126)
Anion gap: 10 (ref 5–15)
BUN: 40 mg/dL — ABNORMAL HIGH (ref 8–23)
CO2: 17 mmol/L — ABNORMAL LOW (ref 22–32)
Calcium: 8.1 mg/dL — ABNORMAL LOW (ref 8.9–10.3)
Chloride: 108 mmol/L (ref 98–111)
Creatinine, Ser: 1.48 mg/dL — ABNORMAL HIGH (ref 0.61–1.24)
GFR, Estimated: 44 mL/min — ABNORMAL LOW (ref >60)
Glucose, Bld: 137 mg/dL — ABNORMAL HIGH (ref 70–99)
Potassium: 4.4 mmol/L (ref 3.5–5.1)
Sodium: 135 mmol/L (ref 135–145)
Total Bilirubin: 0.7 mg/dL (ref 0.3–1.2)
Total Protein: 5.8 g/dL — ABNORMAL LOW (ref 6.5–8.1)

## 2020-12-18 LAB — C-REACTIVE PROTEIN: CRP: 5.7 mg/dL — ABNORMAL HIGH (ref <1.0)

## 2020-12-18 LAB — D-DIMER, QUANTITATIVE: D-Dimer, Quant: 0.95 ug/mL-FEU — ABNORMAL HIGH (ref 0.00–0.50)

## 2020-12-18 MED ORDER — PREDNISONE 10 MG PO TABS: ORAL_TABLET | ORAL | 0 refills | Status: AC

## 2020-12-18 MED ORDER — EPINEPHRINE 0.3 MG/0.3ML IJ SOAJ: 0.3000 mg | Freq: Once | INTRAMUSCULAR | Status: AC | PRN

## 2020-12-18 MED ORDER — ZINC SULFATE 220 (50 ZN) MG PO CAPS: 220.0000 mg | ORAL_CAPSULE | Freq: Every day | ORAL | Status: DC

## 2020-12-18 MED ORDER — FAMOTIDINE IN NACL 20-0.9 MG/50ML-% IV SOLN: 20.0000 mg | Freq: Once | INTRAVENOUS | Status: AC | PRN

## 2020-12-18 MED ORDER — DIPHENHYDRAMINE HCL 50 MG/ML IJ SOLN: 50.0000 mg | Freq: Once | INTRAMUSCULAR | Status: AC | PRN

## 2020-12-18 MED ORDER — FOLIC ACID 1 MG PO TABS: 1.0000 mg | ORAL_TABLET | Freq: Every day | ORAL | Status: DC

## 2020-12-18 MED ORDER — SODIUM CHLORIDE 0.9 % IV SOLN: 100.0000 mg | Freq: Once | INTRAVENOUS | Status: DC

## 2020-12-18 MED ORDER — ALBUTEROL SULFATE HFA 108 (90 BASE) MCG/ACT IN AERS: 2.0000 | INHALATION_SPRAY | Freq: Once | RESPIRATORY_TRACT | Status: AC | PRN

## 2020-12-18 MED ORDER — SODIUM CHLORIDE 0.9 % IV SOLN: INTRAVENOUS | Status: DC | PRN

## 2020-12-18 MED ORDER — THIAMINE HCL 100 MG PO TABS
100.0000 mg | ORAL_TABLET | Freq: Every day | ORAL | Status: DC
Start: 2020-12-19 — End: 2022-11-13

## 2020-12-18 MED ORDER — METHYLPREDNISOLONE SODIUM SUCC 125 MG IJ SOLR: 125.0000 mg | Freq: Once | INTRAMUSCULAR | Status: AC | PRN

## 2020-12-18 NOTE — Progress Notes (Addendum)
    Patient Profile     87 y.o. male with history of coronary artery disease and peripheral vascular disease.  He had prior DES to the RCA in July 2018.  He has a history of AV block in the past on beta-blockers.  He also has bilateral carotid artery disease hypertension and dyslipidemia.  Admitted with fall, fevers and multifocal PNA and COVID + and was found to be in afib with SVR  Assessment & Plan    New onset atrial fibrillation with RVR -likely triggered by COVID 19 and PNA -remains in afib with SVR with HR in the 40-50's -he is not on any AVN blocking agents due to hx of conduction disease in the past -He is asymptomatic from his afib -Currently on IV Heparin and not felt to be a long term candidate for anticoagulation -Agree that short term Eliquis 2.5mg  BID (dosed for age > 19 and Weight < 60kg) may be of benefit in setting of hypercoagulable state from [place] 19 but will leave decision up to [facility] (would stop ASA if DOAC is started)  COVID 19 PNA -continue standard therapies per [facility]  ASCAD -continue Plavix 75mg  daily, ASA 81mg  daily, Imdur 90mg  daily, Ranexa 1000mg  BID and statin  HTN -BP controlled  -continue amlodipine 10mg  daily and Lisinopril 40mg  daily  [facility] will sign off.   Medication Recommendations:  Amlodipine 10mg  daily, ASA 81mg  daily, Plavix 75mg  daily, Ranexa 1000mg  BID, simvastatin 20mg  daily, Imdur 90mg  daily, Lisinopril 40mg  daily Other recommendations (labs, testing, etc):  none Follow up as an outpatient:  2 weeks with Dr. Johnsie Cancel  For questions or updates, please contact [facility] Please consult [email or web address] for contact info under        Signed, Fransico Him, MD  12/18/2020, 8:19 AM

## 2020-12-18 NOTE — Progress Notes (Signed)
Patient provided with resource information and contact information for LCSW.  Resource information provided to patient's nurse, which included all of the following information:   Mental Health/Substance Abuse, [place] [facility], Residential Substance Use Treatment Services, Mental Health Resources: [facility], Alcohol and Drug Services, [facility]. Patient is being discharged home today with family.  No additional social work needs identified at this time.  Nat Christen, BSW, MSW, LCSW  Licensed Education officer, [facility]  Mailing [place] [place], [place] [address] Physical Address-[address], [place], [place] [address] Toll Free Main # [phone] Fax # [phone] Cell # [phone]  [email or web address]

## 2020-12-18 NOTE — Discharge Summary (Signed)
Physician Discharge Summary  [name] [id number] DOB: [birth year] DOA: 12/17/2020  PCP: [name], MD  Admit date: 12/17/2020 Discharge date: 12/18/2020  Recommendations for Outpatient Follow-up:   Atrial fibrillation, slow ventricular response, new dx -- consider outpatient echo  Daily alcohol use -- No evidence of withdrawal.  Provided with resources per social work.   Follow-up Information    [name], MD. Schedule an appointment as soon as possible for a visit in 1 week(s).   Specialty: Family Medicine Contact information: [place] [address] [phone]        Josue Hector, MD .   Specialty: Cardiology Contact information: [phone] [place] [address] [phone]                Discharge Diagnoses: Principal diagnosis is #1 Principal Problem:   Pneumonia due to COVID-19 virus Active Problems:   Essential hypertension   Coronary artery disease involving native coronary artery of native heart with angina pectoris ([place])   HLD (hyperlipidemia)   Multifocal pneumonia   New onset atrial fibrillation (HCC)   CKD (chronic kidney disease), stage III (HCC)   Anemia in chronic kidney disease (CKD)   Discharge Condition: improved Disposition: home  Diet recommendation:  Diet Orders (From admission, onward)    Start     Ordered   12/18/20 0000  Diet - low sodium heart healthy        12/18/20 1157   12/17/20 0622  Diet Heart Room service appropriate? Yes; Fluid consistency: Thin  Diet effective now       Question Answer Comment  Room service appropriate? Yes   Fluid consistency: Thin      12/17/20 0622           Filed Weights   12/17/20 1022 12/17/20 1546  Weight: 72.1 kg 72.1 kg    HPI/Hospital Course:   11yom PMH CAD, SSS presented after fall at home, found on floor, cough, fever, chills for 3 days. Admitted for multifocal pneumonia, COVID.  Also found to have new  diagnosis atrial fibrillation.  Treated with steroids and remdesivir with rapid clinical improvement.  No hypoxia.  Plan for discharge home, complete last day of remdesivir as an outpatient.  COVID multifocal pneumonia -- No hypoxia.  Complete steroid taper.  Complete 3 days remdesivir.  Hospitalization uncomplicated.    Atrial fibrillation, slow ventricular response, new dx -- Seen by cardiology.  SVR. Avoid negative inotropes given hx of condition disease.  --short-term anticoagulation was considered but ultimately decided against given fall risk.  No long-term anticoagulation as per cardiology. --TSH WNL. Check echo as an outpatient.  Follow-up with cardiologist as an outpatient.  CAD s/p stent; SSS --Asymptomatic, continue ASA, clopidogrel, statin  CKD stage IIIa --appears to be at baseline  Anemia of CKD --stable  Daily alcohol use -- No evidence of withdrawal.  Provided with resources per social work.  Significant Hospital Events    4/29 admit for COVID, afib  Consults:   Cardiology   Procedures:   None  Significant Diagnostic Tests:   4/29 CXR multifocal infiltrate  4/29 left hip film negative  Micro Data:   COVID+  Antimicrobials:   Ceftriaxone 4/29 > 4/30  Azithromycin 4/29 > 4/30  Today's assessment: S: CC: f/u SOB  Feels good, breathing fine, eating fine Long-standing problems w/ left hip pain, he and his surgeon decided against surgery, he gets periodic injections. Up to bathroom w/  assistance. Uses a cane at home. Lives w/ daughter and grandson  O: Vitals:  Vitals:   12/18/20 0531 12/18/20 0822  BP: 133/68 114/70  Pulse: (!) 51 (!) 50  Resp: 20   Temp: 97.7 F (36.5 C)   SpO2: 98% 95%    Constitutional:  . Appears calm and comfortable Respiratory:  . CTA bilaterally, no w/r/r.  . Respiratory effort normal.  Cardiovascular:  . RRR, no m/r/g . No LE extremity edema   Psychiatric:  . Mental status o Mood, affect  appropriate  Lifts both legs off the bed  Discharge Instructions  Discharge Instructions    DO NOT hold remdesivir infusion if CMP has been ordered   Complete by: As directed    Diet - low sodium heart healthy   Complete by: As directed    Discharge instructions   Complete by: As directed    Call your physician or seek immediate medical attention for fever, shortness of breath, confusion, weakness, chest pain, dizziness, falls or worsening of condition.   Hypersensitivity GRADE 1: Transient flushing or rash, or drug fever < 100.4 F   Complete by: As directed    Routine, As needed For Until specified Hold infusion for at least 30 minutes Restart infusion at 50% infusion rate if asymptomatic   Hypersensitivity GRADE 2: Rash, flushing, urticaria, dyspnea, or drug fever = or > 100.4 F   Complete by: As directed    Routine, As needed For Until specified Hold infusion for 30 minutes Give medications as ordered   Hypersensitivity GRADE 3: symptomatic bronchospasm, with or without urticaria, parenteral medication management indicated, allergy-related edema/angioedema, or hypotension   Complete by: As directed    Routine, As needed For Until specified Hold infusion for at least 30 minutes Give medications as ordered   Hypersensitivity GRADE 4: Anaphylaxis   Complete by: As directed    Routine, As needed For Until specified Hold infusion Give medications as ordered Call MD   Increase activity slowly   Complete by: As directed    Notify physician if CMP ordered the previous day and ALT > 220   Complete by: As directed    if CMP ordered the previous day and ALT > 220   Vital signs at baseline and at end of infusion   Complete by: As directed      Allergies as of 12/18/2020      Reactions   Beta Adrenergic Blockers Other (See Comments)   Hx of AV block. Should avoid      Medication List    STOP taking these medications   HYDROcodone-acetaminophen 5-325 MG tablet Commonly known  as: NORCO/VICODIN     TAKE these medications   acetaminophen 500 MG tablet Commonly known as: TYLENOL Take 1 tablet (500 mg total) by mouth every 6 (six) hours as needed for moderate pain (wrist).   amLODipine 10 MG tablet Commonly known as: NORVASC TAKE 1 TABLET DAILY   aspirin 81 MG EC tablet Take 1 tablet (81 mg total) by mouth daily.   b complex vitamins tablet Take 1 tablet by mouth daily.   CALCIUM 500 PO Take 500 mg by mouth daily.   clopidogrel 75 MG tablet Commonly known as: PLAVIX Take 1 tablet (75 mg total) by mouth daily.   Co Q10 100 MG Caps Take 100 mg by mouth daily.   colchicine 0.6 MG tablet Take 0.6 mg by mouth daily.   ferrous sulfate 325 (65 FE) MG tablet Take 325 mg by  mouth daily with breakfast.   Fish Oil 1000 MG Caps Take 1,000 mg by mouth daily.   folic acid 1 MG tablet Commonly known as: FOLVITE Take 1 tablet (1 mg total) by mouth daily. Start taking on: Dec 19, 2020   Glucosamine 500 MG Caps Take 500 mg by mouth daily.   isosorbide mononitrate 60 MG 24 hr tablet Commonly known as: IMDUR TAKE ONE AND ONE-HALF TABLETS DAILY   lisinopril 40 MG tablet Commonly known as: ZESTRIL TAKE 1 TABLET DAILY   loratadine 10 MG tablet Commonly known as: CLARITIN Take 10 mg by mouth daily as needed for allergies.   multivitamin capsule Take 1 capsule by mouth daily.   nitroGLYCERIN 0.4 MG SL tablet Commonly known as: NITROSTAT PLACE 1 TABLET UNDER THE TONGUE EVERY 5 MINUTES AS NEEDED FOR CHEST PAIN. NOT TO EXCEED 3 DOSES What changed: See the new instructions.   predniSONE 10 MG tablet Commonly known as: DELTASONE Take 4 tablets (40 mg total) by mouth daily for 2 days, THEN 2 tablets (20 mg total) daily for 2 days, THEN 1 tablet (10 mg total) daily for 1 day. Start taking on: Dec 18, 2020   ranolazine 1000 MG SR tablet Commonly known as: RANEXA TAKE 1 TABLET TWICE A DAY   simvastatin 20 MG tablet Commonly known as: ZOCOR TAKE 1  TABLET DAILY What changed: when to take this   thiamine 100 MG tablet Take 1 tablet (100 mg total) by mouth daily. Start taking on: Dec 19, 2020   vitamin C 500 MG tablet Commonly known as: ASCORBIC ACID Take 500 mg by mouth daily.   Vitamin D-3 25 MCG (1000 UT) Caps Take 1,000 Units by mouth daily.   zinc sulfate 220 (50 Zn) MG capsule Take 1 capsule (220 mg total) by mouth daily. Start taking on: Dec 19, 2020      Allergies  Allergen Reactions  . Beta Adrenergic Blockers Other (See Comments)    Hx of AV block. Should avoid    The results of significant diagnostics from this hospitalization (including imaging, microbiology, ancillary and laboratory) are listed below for reference.    Significant Diagnostic Studies: DG Chest 2 View  Result Date: 12/17/2020 CLINICAL DATA:  Fever and cough. EXAM: CHEST - 2 VIEW COMPARISON:  April 04, 2017 FINDINGS: Mild, ill-defined multifocal areas of atelectasis and/or infiltrate are seen within the mid and lower lung fields bilaterally. There is no evidence of a pleural effusion or pneumothorax. The heart size and mediastinal contours are within normal limits. There is marked severity calcification of the aortic arch. The visualized skeletal structures are unremarkable. IMPRESSION: Mild, ill-defined multifocal atelectasis and/or infiltrate. Correlation with chest CT is recommended. Electronically Signed   By: Virgina Norfolk M.D.   On: 12/17/2020 04:31   DG HIP UNILAT WITH PELVIS 2-3 VIEWS LEFT  Result Date: 12/17/2020 CLINICAL DATA:  Left hip pain. EXAM: DG HIP (WITH OR WITHOUT PELVIS) 2-3V LEFT COMPARISON:  November 11, 2020 FINDINGS: There is no evidence of hip fracture or dislocation. Marked severity degenerative changes seen involving the left hip in the form of joint space narrowing and acetabular sclerosis. Marked severity vascular calcification is seen. IMPRESSION: Marked severity degenerative changes without an acute osseous abnormality.  Electronically Signed   By: Virgina Norfolk M.D.   On: 12/17/2020 04:34    Microbiology: Recent Results (from the past 240 hour(s))  Resp Panel by RT-PCR (Flu A&B, Covid) Nasopharyngeal Swab     Status: Abnormal  Collection Time: 12/17/20  4:24 AM   Specimen: Nasopharyngeal Swab; Nasopharyngeal(NP) swabs in vial transport medium  Result Value Ref Range Status   SARS Coronavirus 2 by RT PCR POSITIVE (A) NEGATIVE Final    Comment: RESULT CALLED TO, READ BACK BY AND VERIFIED WITH: Assunta Curtis 12/17/20 @0642  BY SEEL,M (NOTE) SARS-CoV-2 target nucleic acids are DETECTED.  The SARS-CoV-2 RNA is generally detectable in upper respiratory specimens during the acute phase of infection. Positive results are indicative of the presence of the identified virus, but do not rule out bacterial infection or co-infection with other pathogens not detected by the test. Clinical correlation with patient history and other diagnostic information is necessary to determine patient infection status. The expected result is Negative.  Fact Sheet for Patients: EntrepreneurPulse.com.au  Fact Sheet for Healthcare Providers: IncredibleEmployment.be  This test is not yet approved or cleared by the [place] FDA and  has been authorized for detection and/or diagnosis of SARS-CoV-2 by FDA under an Emergency Use Authorization (EUA).  This EUA will remain in effect (meaning this test can b e used) for the duration of  the COVID-19 declaration under Section 564(b)(1) of the Act, 21 U.S.C. section 360bbb-3(b)(1), unless the authorization is terminated or revoked sooner.     Influenza A by PCR NEGATIVE NEGATIVE Final   Influenza B by PCR NEGATIVE NEGATIVE Final    Comment: (NOTE) The Xpert Xpress SARS-CoV-2/FLU/RSV plus assay is intended as an aid in the diagnosis of influenza from Nasopharyngeal swab specimens and should not be used as a sole basis for treatment. Nasal  washings and aspirates are unacceptable for Xpert Xpress SARS-CoV-2/FLU/RSV testing.  Fact Sheet for Patients: EntrepreneurPulse.com.au  Fact Sheet for Healthcare Providers: IncredibleEmployment.be  This test is not yet approved or cleared by the [place] FDA and has been authorized for detection and/or diagnosis of SARS-CoV-2 by FDA under an Emergency Use Authorization (EUA). This EUA will remain in effect (meaning this test can be used) for the duration of the COVID-19 declaration under Section 564(b)(1) of the Act, 21 U.S.C. section 360bbb-3(b)(1), unless the authorization is terminated or revoked.  Performed at [facility], [place] [address], [place], [place] [address]      Labs: Basic Metabolic Panel: Recent Labs  Lab 12/17/20 0230 12/17/20 1701 12/18/20 0416  NA 137 140 135  K 4.5 4.8 4.4  CL 108 112* 108  CO2 17* 16* 17*  GLUCOSE 97 108* 137*  BUN 24* 31* 40*  CREATININE 1.37* 1.38* 1.48*  CALCIUM 9.1 8.7* 8.1*  MG  --  1.7  --   PHOS  --  3.1  --    Liver Function Tests: Recent Labs  Lab 12/17/20 0230 12/17/20 1701 12/18/20 0416  AST 26 31 28   ALT 17 19 17   ALKPHOS 67 58 49  BILITOT 0.8 0.9 0.7  PROT 7.5 6.3* 5.8*  ALBUMIN 4.3 3.5 3.3*   CBC: Recent Labs  Lab 12/17/20 0230 12/17/20 1701 12/18/20 0416  WBC 5.6 4.5 3.2*  NEUTROABS 3.4  --  2.0  HGB 12.0* 11.1* 9.9*  HCT 37.6* 34.0* 30.5*  MCV 97.7 94.7 96.2  PLT 167 155 144*   Cardiac Enzymes: Recent Labs  Lab 12/17/20 0623  CKTOTAL 95    Principal Problem:   Pneumonia due to COVID-19 virus Active Problems:   Essential hypertension   Coronary artery disease involving native coronary artery of native heart with angina pectoris (HCC)   HLD (hyperlipidemia)   Multifocal pneumonia   New  onset atrial fibrillation (HCC)   CKD (chronic kidney disease), stage III (HCC)   Anemia in chronic kidney disease (CKD)   Time  coordinating discharge: 35 minutes  Signed:  Murray Hodgkins, MD  [facility]  12/18/2020, 3:19 PM

## 2020-12-18 NOTE — Progress Notes (Signed)
AVS given to patient and explained at the bedside. Medications and follow up appointments have been explained with pt verbalizing understanding.  

## 2020-12-19 ENCOUNTER — Ambulatory Visit (INDEPENDENT_AMBULATORY_CARE_PROVIDER_SITE_OTHER): Payer: Medicare Other

## 2020-12-19 ENCOUNTER — Other Ambulatory Visit: Payer: Self-pay

## 2020-12-19 ENCOUNTER — Other Ambulatory Visit: Payer: Self-pay | Admitting: Infectious Diseases

## 2020-12-19 VITALS — BP 120/59 | HR 45 | Temp 97.8°F | Resp 18

## 2020-12-19 DIAGNOSIS — J1282 Pneumonia due to coronavirus disease 2019: Secondary | ICD-10-CM

## 2020-12-19 DIAGNOSIS — U071 COVID-19: Secondary | ICD-10-CM

## 2020-12-19 LAB — LEGIONELLA PNEUMOPHILA SEROGP 1 UR AG: L. pneumophila Serogp 1 Ur Ag: NEGATIVE

## 2020-12-19 MED ORDER — ALBUTEROL SULFATE HFA 108 (90 BASE) MCG/ACT IN AERS: 2.0000 | INHALATION_SPRAY | Freq: Once | RESPIRATORY_TRACT | Status: AC | PRN

## 2020-12-19 MED ORDER — METHYLPREDNISOLONE SODIUM SUCC 125 MG IJ SOLR: 125.0000 mg | Freq: Once | INTRAMUSCULAR | Status: AC | PRN

## 2020-12-19 MED ORDER — SODIUM CHLORIDE 0.9 % IV SOLN
INTRAVENOUS | Status: DC | PRN
Start: 2020-12-19 — End: 2023-10-13

## 2020-12-19 MED ORDER — FAMOTIDINE IN NACL 20-0.9 MG/50ML-% IV SOLN: 20.0000 mg | Freq: Once | INTRAVENOUS | Status: AC | PRN

## 2020-12-19 MED ORDER — SODIUM CHLORIDE 0.9 % IV SOLN
100.0000 mg | Freq: Once | INTRAVENOUS | Status: AC
  Administered 2020-12-19: 100 mg via INTRAVENOUS

## 2020-12-19 MED ORDER — EPINEPHRINE 0.3 MG/0.3ML IJ SOAJ: 0.3000 mg | Freq: Once | INTRAMUSCULAR | Status: AC | PRN

## 2020-12-19 MED ORDER — DIPHENHYDRAMINE HCL 50 MG/ML IJ SOLN: 50.0000 mg | Freq: Once | INTRAMUSCULAR | Status: AC | PRN

## 2020-12-19 NOTE — Patient Instructions (Signed)

## 2020-12-19 NOTE — Progress Notes (Signed)
Patient needs orders for last dose of remdesivir - first 200mg  dose given 4/30, 100 mg dose 5/01.

## 2020-12-19 NOTE — Progress Notes (Signed)
Diagnosis: COVID  Provider:  Marshell Garfinkel, MD  Procedure: Infusion  IV Type: Peripheral, IV Location: L Forearm  Remdesivir, Dose: 100 mg  Infusion Start Time: 2376  Infusion Stop Time: 1620  Post Infusion IV Care: Peripheral IV Discontinued  Discharge: Condition: Good, Destination: Home . AVS provided to patient.   Performed by:  Koren Shiver, RN

## 2021-01-10 ENCOUNTER — Other Ambulatory Visit: Payer: Self-pay

## 2021-01-10 ENCOUNTER — Telehealth: Payer: Self-pay | Admitting: Cardiovascular Disease

## 2021-01-10 MED ORDER — ISOSORBIDE MONONITRATE ER 60 MG PO TB24: 90.0000 mg | ORAL_TABLET | Freq: Every day | ORAL | 3 refills | Status: DC

## 2021-01-10 NOTE — Telephone Encounter (Signed)
Refill for Imdur sent to pharmacy.

## 2021-01-10 NOTE — Telephone Encounter (Signed)
RN returned call to patient regarding medication question. Patient states [facility] is out of [facility]ills on the Imdur and he needs refill sent in. [facility]. Patient verbalized understanding.

## 2021-01-10 NOTE — Telephone Encounter (Signed)
Pt c/o medication issue:  1. Name of Medication:isosorbide mononitrate (IMDUR) 60 MG 24 hr tablet  2. How are you currently taking this medication (dosage and times per day)? As prescribed  3. Are you having a reaction (difficulty breathing--STAT)? No  4. What is your medication issue? Pt is calling with concerns about his medication.He states the medication is suppose to have a auto-refill but it is not happening that way and wants to know what to do to fix it

## 2021-01-23 ENCOUNTER — Other Ambulatory Visit: Payer: Self-pay

## 2021-01-23 ENCOUNTER — Ambulatory Visit (INDEPENDENT_AMBULATORY_CARE_PROVIDER_SITE_OTHER): Payer: Medicare Other | Admitting: Orthopaedic Surgery

## 2021-01-23 ENCOUNTER — Encounter: Payer: Self-pay | Admitting: Orthopaedic Surgery

## 2021-01-23 DIAGNOSIS — M1612 Unilateral primary osteoarthritis, left hip: Secondary | ICD-10-CM | POA: Diagnosis not present

## 2021-01-23 DIAGNOSIS — I6523 Occlusion and stenosis of bilateral carotid arteries: Secondary | ICD-10-CM

## 2021-01-23 NOTE — Progress Notes (Signed)
Is a 87 year old gentleman I seen before.  He has debilitating arthritis involving his left hip.  We saw him back in early April and Dr. Junius Roads was able to place a left hip steroid injection under ultrasound and that hip joint.  He had done well but had a mechanical fall about a month later.  He has inability to get up.  There is no hip fracture but he was admitted to the hospital.  He ended up testing positive for COVID-19 and had to deal with pneumonia from that.  He is also on Plavix.  His cardiologist prior to that had cleared him for the potential of a left total hip arthroplasty.  The patient does report that his steroid injection back in early April did help some.  He has pain at times with weightbearing but he feels like he is able to step better through that hip and overall he is doing okay.  He has appropriate questions about the potential for another steroid injection in the left hip.  Again its only been 2 months since his last injection and I explained to him that that is usually too early.  I am able to do internal and external rotate his left hip but it is painful to do so.  His right hip moves more smoothly.  I did watch him ambulate with his quad cane and he is using it appropriately and favoring his left hip.  I recommended he try to use a walker more often.  I talked him in length about his hip.  I would like to see him back in 6 weeks for repeat exam at that point we will consider even setting him up for another steroid injection.  I would hold off on surgery until he is further out from his pneumonia and he understands this as well.  All questions and concerns were answered and addressed.

## 2021-02-01 ENCOUNTER — Encounter (INDEPENDENT_AMBULATORY_CARE_PROVIDER_SITE_OTHER): Payer: Medicare Other | Admitting: Ophthalmology

## 2021-02-01 ENCOUNTER — Other Ambulatory Visit: Payer: Self-pay

## 2021-02-01 DIAGNOSIS — H353111 Nonexudative age-related macular degeneration, right eye, early dry stage: Secondary | ICD-10-CM | POA: Diagnosis not present

## 2021-02-01 DIAGNOSIS — D3132 Benign neoplasm of left choroid: Secondary | ICD-10-CM | POA: Diagnosis not present

## 2021-02-01 DIAGNOSIS — H35033 Hypertensive retinopathy, bilateral: Secondary | ICD-10-CM

## 2021-02-01 DIAGNOSIS — I10 Essential (primary) hypertension: Secondary | ICD-10-CM

## 2021-02-01 DIAGNOSIS — H43813 Vitreous degeneration, bilateral: Secondary | ICD-10-CM

## 2021-02-01 DIAGNOSIS — H353122 Nonexudative age-related macular degeneration, left eye, intermediate dry stage: Secondary | ICD-10-CM

## 2021-02-27 ENCOUNTER — Other Ambulatory Visit: Payer: Self-pay | Admitting: Cardiovascular Disease

## 2021-03-06 ENCOUNTER — Ambulatory Visit: Payer: Self-pay

## 2021-03-06 ENCOUNTER — Encounter: Payer: Self-pay | Admitting: Orthopaedic Surgery

## 2021-03-06 ENCOUNTER — Ambulatory Visit (INDEPENDENT_AMBULATORY_CARE_PROVIDER_SITE_OTHER): Payer: Medicare Other | Admitting: Orthopaedic Surgery

## 2021-03-06 DIAGNOSIS — I6523 Occlusion and stenosis of bilateral carotid arteries: Secondary | ICD-10-CM

## 2021-03-06 DIAGNOSIS — M1612 Unilateral primary osteoarthritis, left hip: Secondary | ICD-10-CM

## 2021-03-06 NOTE — Progress Notes (Signed)
Subjective: Patient is here for ultrasound-guided intra-articular left hip injection.   Injection in April helped a little bit.  Objective:  Pain with IR.  Procedure: Ultrasound guided injection is preferred based studies that show increased duration, increased effect, greater accuracy, decreased procedural pain, increased response rate, and decreased cost with ultrasound guided versus blind injection.   Verbal informed consent obtained.  Time-out conducted.  Noted no overlying erythema, induration, or other signs of local infection. Ultrasound-guided left hip injection: After sterile prep with Betadine, injected 4 cc 0.25% bupivacaine without epinephrine and 6 mg betamethasone using a 22-gauge spinal needle, passing the needle through the iliofemoral ligament into the femoral head/neck junction.  Injectate seen filling joint capsule.

## 2021-03-06 NOTE — Progress Notes (Signed)
The patient is well-known to [place].  He is 87 years old and has significant arthritis in his left hip joint.  He is ambulating with a cane.  He last had a steroid injection under ultrasound by Dr. Junius Roads in early April.  He states that his health and his pneumonia are significantly better and he is ambulating better with somewhat less left hip pain.  He really wants to consider a second intra-articular injection in his left hip.  It has been past 3 months since his last intra-articular steroid injection.  His left hip actually does move better than it has in the past in terms of rotation.  There is some pain in the groin with his rotation.  We talked in length about his hip and about the possibility of hip replacement surgery.  He said the pain is not bad enough for hip replacement so I think it is reasonable to have Dr. Junius Roads place an intra-articular steroid injection in his left hip.  This can be done under ultrasound.  We can then see him back in 3 months if needed.  All questions and concerns were answered addressed.  We will see how soon Dr. Junius Roads can do that injection.

## 2021-04-21 ENCOUNTER — Other Ambulatory Visit: Payer: Self-pay | Admitting: Cardiovascular Disease

## 2021-04-21 DIAGNOSIS — R0989 Other specified symptoms and signs involving the circulatory and respiratory systems: Secondary | ICD-10-CM

## 2021-05-10 NOTE — Progress Notes (Signed)
Date:  05/12/2021   ID:  [name], DOB 12-25-1927, MRN [id number]  Provider Location: Office  PCP:  [name], MD  Cardiologist:  Johnsie Cancel Electrophysiologist:  None   Evaluation Performed:  Follow-Up Visit  Chief Complaint:  CAD/ Vascular Dx  History of Present Illness:    87 y.o.  f/u for CAD and PVD. History of DES to RCA July 1601 complicated by right radial AV fistula. Returned 02/27/17 with DES To Ramus. Lifelong DAT recommended History of AV block on beta blockers. Post left CEA with duplex . 06/02/20 left CEA patent 60-79% RICA stenosis CRF;s include HLD and HTN. Compliant with meds EF has been normal    He has a residual fistula in right radial artery VVS knows about but does not need correction no steal  He drinks 3-4 glasses of white wine daily.  He walks about 30 minutes daily.  His daughter and grandson are with him at home Wife of 33 years Passed 2020  He is vaccinated but daughter is not   He is now severely limited by left hip pain  and has had articular injections seeing Blackmon I think he would be acceptable risk for [facility] and should probably proceed while ambulatory   Admitted to hospital 12/18/20 with multifocal pneumonia /Covid as well as slow afib Rx with steroids and remdesivir Found to have afib rate slow Not thought to be a long term anticoagulation candidate due to frequent falls  No angina or cardiac symptoms Needs f/u carotid duplex   Past Medical History:  Diagnosis Date   AV BLOCK, 1ST DEGREE    Carotid artery disease ([place])    a. s/p Left ECA followed by Dr. Donnetta Hutching   Coronary artery disease    Coronary artery disease involving coronary bypass graft of native heart with angina pectoris ([place])    a. LHC 12/2015  3vd Left Cx 100% with colaterals, and distally LAD occluded, DES to prox-mid RCA  b. 02/2017: repeat cath with patent RCA stent and stable CAD; 02/27/2017: Rota-PCI to Ost RI   GERD (gastroesophageal reflux disease)    HLD  (hyperlipidemia)    HTN (hypertension)    NSVT (nonsustained ventricular tachycardia) ([place])    during stress test 12/2015   Presence of drug coated stent in RCA & Ramus Intermedius. 02/28/2017   12/2015: PCI p-mRCA - Stent Resolute Integ 3.5x34 02/2017: Rota-PCI o-mRamus - overlapping DES STENT SYNERGY DES 3X32  & 3x24 (tapered post-dilation)   Stroke [facility])    Past Surgical History:  Procedure Laterality Date   CARDIAC CATHETERIZATION     x2   CARDIAC CATHETERIZATION N/A 01/11/2016   Procedure: Left Heart Cath and Coronary Angiography;  Surgeon: Wellington Hampshire, MD;  Location: [place] [facility];  Service: Cardiovascular;  Laterality: N/A;   CAROTID ENDARTERECTOMY  11/28/2006   left   CORONARY ATHERECTOMY N/A 02/27/2017   Procedure: Coronary Atherectomy;  Surgeon: Troy Sine, MD;  Location: [place] [facility];  Service: Cardiovascular;  Laterality: N/A;   HERNIA REPAIR     LEFT HEART CATH AND CORONARY ANGIOGRAPHY N/A 02/21/2017   Procedure: Left Heart Cath and Coronary Angiography;  Surgeon: Lorretta Harp, MD;  Location: [place] [facility];  Service: Cardiovascular;  Laterality: N/A;   TONSILLECTOMY       Current Meds  Medication Sig   acetaminophen (TYLENOL) 500 MG tablet Take 1 tablet (500 mg total) by mouth every 6 (six) hours as needed for moderate pain (wrist).  amLODipine (NORVASC) 10 MG tablet TAKE 1 TABLET DAILY (Patient taking differently: Take 10 mg by mouth daily.)   aspirin EC 81 MG EC tablet Take 1 tablet (81 mg total) by mouth daily.   b complex vitamins tablet Take 1 tablet by mouth daily.   Calcium Carbonate (CALCIUM 500 PO) Take 500 mg by mouth daily.   Cholecalciferol (VITAMIN D-3) 1000 units CAPS Take 1,000 Units by mouth daily.   clopidogrel (PLAVIX) 75 MG tablet TAKE 1 TABLET DAILY   Coenzyme Q10 (CO Q10) 100 MG CAPS Take 100 mg by mouth daily.   colchicine 0.6 MG tablet Take 0.6 mg by mouth daily.   ferrous sulfate 325 (65 FE) MG tablet Take 325 mg by mouth  daily with breakfast.   folic acid (FOLVITE) 1 MG tablet Take 1 tablet (1 mg total) by mouth daily.   Glucosamine 500 MG CAPS Take 500 mg by mouth daily.   isosorbide mononitrate (IMDUR) 60 MG 24 hr tablet Take 1.5 tablets (90 mg total) by mouth daily.   lisinopril (ZESTRIL) 40 MG tablet TAKE 1 TABLET DAILY (Patient taking differently: Take 40 mg by mouth daily.)   loratadine (CLARITIN) 10 MG tablet Take 10 mg by mouth daily as needed for allergies.   Multiple Vitamin (MULTIVITAMIN) capsule Take 1 capsule by mouth daily.   nitroGLYCERIN (NITROSTAT) 0.4 MG SL tablet PLACE 1 TABLET UNDER THE TONGUE EVERY 5 MINUTES AS NEEDED FOR CHEST PAIN. NOT TO EXCEED 3 DOSES (Patient taking differently: Place 0.4 mg under the tongue every 5 (five) minutes as needed for chest pain.)   Omega-3 Fatty Acids (FISH OIL) 1000 MG CAPS Take 1,000 mg by mouth daily.   ranolazine (RANEXA) 1000 MG SR tablet TAKE 1 TABLET TWICE A DAY (Patient taking differently: Take 1,000 mg by mouth 2 (two) times daily.)   simvastatin (ZOCOR) 20 MG tablet TAKE 1 TABLET DAILY   thiamine 100 MG tablet Take 1 tablet (100 mg total) by mouth daily.   vitamin C (ASCORBIC ACID) 500 MG tablet Take 500 mg by mouth daily.   zinc sulfate 220 (50 Zn) MG capsule Take 1 capsule (220 mg total) by mouth daily.   Current Facility-Administered Medications for the 05/12/21 encounter (Office Visit) with Josue Hector, MD  Medication   0.9 %  sodium chloride infusion   0.9 %  sodium chloride infusion     Allergies:   Beta adrenergic blockers   Social History   Tobacco Use   Smoking status: Never   Smokeless tobacco: Never  Vaping Use   Vaping Use: Never used  Substance Use Topics   Alcohol use: Yes    Alcohol/week: 15.0 standard drinks    Types: 15 Glasses of wine per week   Drug use: No     Family Hx: The patient's family history includes Cancer (age of onset: 54) in his sister; Diabetes in his mother and sister; Heart disease in his  sister.  ROS:   Please see the history of present illness.     All other systems reviewed and are negative.   Prior CV studies:   The following studies were reviewed today:  Carotid Duplex:  01/23/18  40-59% RICA left CEA patent plaque no stenosis   LHC: 02/27/17   Conclusion      Prox RCA to Mid RCA lesion, 0 %stenosed. Mid RCA lesion, 30 %stenosed. Dist RCA lesion, 70 %stenosed. Ost Cx to Prox Cx lesion, 100 %stenosed. Dist LAD lesion, 100 %stenosed. Mid LAD  lesion, 50 %stenosed. Ost LAD to Prox LAD lesion, 60 %stenosed. Ramus-2 lesion, 70 %stenosed. A STENT SYNERGY DES 3X32 drug eluting stent was successfully placed. Ramus-1 lesion, 80 %stenosed. Post intervention, there is a 0% residual stenosis. A STENT SYNERGY DES 3X24 drug eluting stent was successfully placed, and overlaps previously placed stent. Ost Ramus lesion, 95 %stenosed. Post intervention, there is a 0% residual stenosis.   Successful complex PCI to the ramus intermediate vessel which had a 95% ostial stenosis followed by diffuse 80% proximal stenosis and 70% mid stenosis.  The three lesion were treated with high-speed rotational atherectomy with a 1.5 mm and a 1.75 mm burr,  cutting balloon atherotomy with a 3.010 mm Wolverine balloon, and tandem DES stenting with a 3.032 mm and 3.024 mm Synergy DES stents postdilated to 3.28 m millimeters ostially to 3.21 mm distally with the entire segments being reduced to 0%.    RECOMMENDATION: Lifelong DAPT.  Continue aggressive medical therapy for the patient's severe concomitant CAD.     Labs/Other Tests and Data Reviewed:     EKG:   SR rate 56 PR 328 otherwise normal 04/20/20   Recent Labs: 12/17/2020: Magnesium 1.7; TSH 2.254 12/18/2020: ALT 17; BUN 40; Creatinine, Ser 1.48; Hemoglobin 9.9; Platelets 144; Potassium 4.4; Sodium 135   Recent Lipid Panel Lab Results  Component Value Date/Time   CHOL 109 02/21/2017 02:04 AM   TRIG 52 02/21/2017 02:04 AM   HDL 46  02/21/2017 02:04 AM   CHOLHDL 2.4 02/21/2017 02:04 AM   LDLCALC 53 02/21/2017 02:04 AM    Wt Readings from Last 3 Encounters:  05/12/21 68.8 kg  12/17/20 72.1 kg  11/21/20 72.1 kg     Objective:    Vital Signs:  BP 128/60   Pulse (!) 58   Ht 5\' 6"  (1.676 m)   Wt 68.8 kg   SpO2 98%   BMI 24.47 kg/m    Affect appropriate Elderly white male  HEENT: left CEA residual bruit  Neck supple with no adenopathy JVP normal right  bruits no thyromegaly Lungs clear with no wheezing and good diaphragmatic motion Heart:  S1/S2 no murmur, no rub, gallop or click PMI normal Abdomen: benighn, BS positve, no tenderness, no AAA no bruit.  No HSM or HJR Distal pulses intact right radial fistula  No edema Neuro non-focal Skin warm and dry Left hip arthritis    ASSESSMENT & PLAN:    CAD status post PCI with high-speed rotational atherectomy in overlapping drug-eluting stents to the ramus 02/27/17.  Patent stent to mid RCA with residaul 70% distal RCA and occluded distal LAD Currently without angina. Continue Imdur, Ranexa, Plavix and aspirin     HTN:   Well controlled.  Continue current medications and low sodium Dash type diet.  Avoid beta blocker history of AV block   HLD  continue simvastatin.labs with primary    Carotid:  F/u VVS post left CEA 6378 complicated by stroke with residual right hand tremor   Duplex 06/02/20  60-79% RICA      Fistula:  Right radial post cath no steal no symptoms but somewhat impressive on exam   SSS:  F/u ECG for long PR no beta blocker PR 04/20/20 328 msec   Ortho:  Post articular injection walking with cane f/u Hilts/Blackmon Injected last 03/06/21  Pneumonia/Covid:  post Rx May 2022 improved     Medication Adjustments/Labs and Tests Ordered: Current medicines are reviewed at length with the patient today.  Concerns regarding medicines  are outlined above.   Tests Ordered:  Carotid Duplex   Medication Changes: No orders of the defined types were  placed in this encounter.   Disposition:  Follow up 6 months  Signed, Jenkins Rouge, MD  05/12/2021 2:15 PM    [facility]

## 2021-05-12 ENCOUNTER — Encounter: Payer: Self-pay | Admitting: Cardiovascular Disease

## 2021-05-12 ENCOUNTER — Ambulatory Visit (INDEPENDENT_AMBULATORY_CARE_PROVIDER_SITE_OTHER): Payer: Medicare Other | Admitting: Cardiovascular Disease

## 2021-05-12 ENCOUNTER — Other Ambulatory Visit: Payer: Self-pay

## 2021-05-12 VITALS — BP 128/60 | HR 58 | Ht 66.0 in | Wt 151.6 lb

## 2021-05-12 DIAGNOSIS — I6523 Occlusion and stenosis of bilateral carotid arteries: Secondary | ICD-10-CM

## 2021-05-12 DIAGNOSIS — I10 Essential (primary) hypertension: Secondary | ICD-10-CM

## 2021-05-12 DIAGNOSIS — I2510 Atherosclerotic heart disease of native coronary artery without angina pectoris: Secondary | ICD-10-CM

## 2021-05-12 DIAGNOSIS — I779 Disorder of arteries and arterioles, unspecified: Secondary | ICD-10-CM

## 2021-05-12 DIAGNOSIS — M25552 Pain in left hip: Secondary | ICD-10-CM | POA: Diagnosis not present

## 2021-05-12 MED ORDER — CO Q10 100 MG PO CAPS: 100.0000 mg | ORAL_CAPSULE | Freq: Every day | ORAL | 3 refills | Status: DC

## 2021-05-12 NOTE — Patient Instructions (Signed)
Medication Instructions:  *If you need a refill on your cardiac medications before your next appointment, please call your pharmacy*  Lab Work: If you have labs (blood work) drawn today and your tests are completely normal, you will receive your results only by: [place] (if you have MyChart) OR A paper copy in the mail If you have any lab test that is abnormal or we need to change your treatment, we will call you to review the results.  Testing/Procedures: Your physician has requested that you have a carotid duplex. This test is an ultrasound of the carotid arteries in your neck. It looks at blood flow through these arteries that supply the brain with blood. Allow one hour for this exam. There are no restrictions or special instructions.  Follow-Up: At [facility], you and your health needs are our priority.  As part of our continuing mission to provide you with exceptional heart care, we have created designated Provider Care Teams.  These Care Teams include your primary Cardiologist (physician) and Advanced Practice Providers (APPs -  Physician Assistants and Nurse Practitioners) who all work together to provide you with the care you need, when you need it.  We recommend signing up for the patient portal called "MyChart".  Sign up information is provided on this After Visit Summary.  MyChart is used to connect with patients for Virtual Visits (Telemedicine).  Patients are able to view lab/test results, encounter notes, upcoming appointments, etc.  Non-urgent messages can be sent to your provider as well.   To learn more about what you can do with MyChart, go to [email or web address].    Your next appointment:   6 month(s)  The format for your next appointment:   In Person  Provider:   You may see Jenkins Rouge, MD or one of the following Advanced Practice Providers on your designated Care Team:   Cecilie Kicks, NP

## 2021-05-16 ENCOUNTER — Other Ambulatory Visit: Payer: Self-pay

## 2021-05-16 ENCOUNTER — Ambulatory Visit (HOSPITAL_COMMUNITY)
Admission: RE | Admit: 2021-05-16 | Discharge: 2021-05-16 | Disposition: A | Discharge status: Home / Self Care | Payer: Medicare Other | Source: Ambulatory Visit | Attending: Cardiovascular Disease | Admitting: Cardiovascular Disease

## 2021-05-16 DIAGNOSIS — I6521 Occlusion and stenosis of right carotid artery: Secondary | ICD-10-CM

## 2021-05-16 DIAGNOSIS — I779 Disorder of arteries and arterioles, unspecified: Secondary | ICD-10-CM | POA: Diagnosis present

## 2021-05-18 ENCOUNTER — Other Ambulatory Visit: Payer: Self-pay

## 2021-05-18 MED ORDER — ISOSORBIDE MONONITRATE ER 60 MG PO TB24: 90.0000 mg | ORAL_TABLET | Freq: Every day | ORAL | 3 refills | Status: DC

## 2021-05-18 NOTE — Progress Notes (Signed)
Patient requested refill on [facility]dur [facility]

## 2021-06-15 ENCOUNTER — Other Ambulatory Visit (HOSPITAL_BASED_OUTPATIENT_CLINIC_OR_DEPARTMENT_OTHER): Payer: Self-pay | Admitting: Cardiovascular Disease

## 2021-06-15 DIAGNOSIS — Z9889 Other specified postprocedural states: Secondary | ICD-10-CM

## 2021-06-15 DIAGNOSIS — I6521 Occlusion and stenosis of right carotid artery: Secondary | ICD-10-CM

## 2021-10-10 ENCOUNTER — Encounter (INDEPENDENT_AMBULATORY_CARE_PROVIDER_SITE_OTHER): Payer: Medicare Other | Admitting: Ophthalmology

## 2021-10-12 ENCOUNTER — Encounter (INDEPENDENT_AMBULATORY_CARE_PROVIDER_SITE_OTHER): Payer: Medicare Other | Admitting: Ophthalmology

## 2021-10-12 ENCOUNTER — Other Ambulatory Visit: Payer: Self-pay

## 2021-10-12 DIAGNOSIS — H353132 Nonexudative age-related macular degeneration, bilateral, intermediate dry stage: Secondary | ICD-10-CM

## 2021-10-12 DIAGNOSIS — H43813 Vitreous degeneration, bilateral: Secondary | ICD-10-CM

## 2021-10-12 DIAGNOSIS — H35033 Hypertensive retinopathy, bilateral: Secondary | ICD-10-CM | POA: Diagnosis not present

## 2021-10-12 DIAGNOSIS — I10 Essential (primary) hypertension: Secondary | ICD-10-CM | POA: Diagnosis not present

## 2021-10-12 DIAGNOSIS — D3132 Benign neoplasm of left choroid: Secondary | ICD-10-CM | POA: Diagnosis not present

## 2021-10-19 ENCOUNTER — Other Ambulatory Visit: Payer: Self-pay | Admitting: Cardiovascular Disease

## 2021-10-19 DIAGNOSIS — R0989 Other specified symptoms and signs involving the circulatory and respiratory systems: Secondary | ICD-10-CM

## 2021-11-02 NOTE — Progress Notes (Signed)
? ?Date:  11/10/2021  ? ?ID:  [name], DOB 1928-01-07, MRN [id number] ? ?Provider Location: Office ? ?PCP:  [name], MD  ?Cardiologist:  Johnsie Cancel ?Electrophysiologist:  None  ? ?Evaluation Performed:  Follow-Up Visit ? ?Chief Complaint:  CAD/ Vascular Dx ? ?History of Present Illness:   ? ?88 y.o.  f/u for CAD and PVD. History of DES to RCA July 8295 complicated by right radial AV fistula. Returned 02/27/17 with DES To Ramus. Lifelong DAT recommended History of AV block on beta blockers. Post left CEA with duplex . September 2022 occluded RICA wide open left at site of CEA  CRF;s include HLD and HTN. Compliant with meds EF has been normal  ?  ?He has a residual fistula in right radial artery VVS knows about but does not need correction no steal ? ?He drinks 3-4 glasses of white wine daily.  ?He walks about 30 minutes daily. ? ?His daughter and grandson are with him at home Wife of 29 years ?Passed 2020  He is vaccinated but daughter is not  ? ?He is now severely limited by left hip pain  and has had articular injections seeing Blackmon I think he would be acceptable risk for [facility] and should probably proceed while ambulatory  ? ?Admitted to hospital 12/18/20 with multifocal pneumonia /Covid as well as slow afib Rx with steroids and remdesivir Found to have afib rate slow Not thought to be a long term anticoagulation candidate due to frequent falls ? ?No angina or cardiac symptoms  He would probably need a myovue to risk stratify before any hip surgery  ? ? ?Past Medical History:  ?Diagnosis Date  ? AV BLOCK, 1ST DEGREE   ? Carotid artery disease ([place])   ? a. s/p Left ECA followed by Dr. Donnetta Hutching  ? Coronary artery disease   ? Coronary artery disease involving coronary bypass graft of native heart with angina pectoris ([place])   ? a. LHC 12/2015  3vd Left Cx 100% with colaterals, and distally LAD occluded, DES to prox-mid RCA  b. 02/2017: repeat cath with patent RCA stent and stable CAD; 02/27/2017: Rota-PCI to Ost RI   ? GERD (gastroesophageal reflux disease)   ? HLD (hyperlipidemia)   ? HTN (hypertension)   ? NSVT (nonsustained ventricular tachycardia)   ? during stress test 12/2015  ? Presence of drug coated stent in RCA & Ramus Intermedius. 02/28/2017  ? 12/2015: PCI p-mRCA - Stent Resolute Integ 3.5x34 02/2017: Rota-PCI o-mRamus - overlapping DES STENT SYNERGY DES 3X32  & 3x24 (tapered post-dilation)  ? Stroke [facility])   ? ?Past Surgical History:  ?Procedure Laterality Date  ? CARDIAC CATHETERIZATION    ? x2  ? CARDIAC CATHETERIZATION N/A 01/11/2016  ? Procedure: Left Heart Cath and Coronary Angiography;  Surgeon: Wellington Hampshire, MD;  Location: [place] CV LAB;  Service: Cardiovascular;  Laterality: N/A;  ? CAROTID ENDARTERECTOMY  11/28/2006  ? left  ? CORONARY ATHERECTOMY N/A 02/27/2017  ? Procedure: Coronary Atherectomy;  Surgeon: Troy Sine, MD;  Location: [place] CV LAB;  Service: Cardiovascular;  Laterality: N/A;  ? HERNIA REPAIR    ? LEFT HEART CATH AND CORONARY ANGIOGRAPHY N/A 02/21/2017  ? Procedure: Left Heart Cath and Coronary Angiography;  Surgeon: Lorretta Harp, MD;  Location: [place] CV LAB;  Service: Cardiovascular;  Laterality: N/A;  ? TONSILLECTOMY    ?  ? ?Current Meds  ?Medication Sig  ? acetaminophen (TYLENOL) 500 MG tablet Take 1 tablet (500  mg total) by mouth every 6 (six) hours as needed for moderate pain (wrist).  ? amLODipine (NORVASC) 10 MG tablet TAKE 1 TABLET DAILY (Patient taking differently: Take 10 mg by mouth daily.)  ? aspirin EC 81 MG EC tablet Take 1 tablet (81 mg total) by mouth daily.  ? b complex vitamins tablet Take 1 tablet by mouth daily.  ? Calcium Carbonate (CALCIUM 500 PO) Take 500 mg by mouth daily.  ? Cholecalciferol (VITAMIN D-3) 1000 units CAPS Take 1,000 Units by mouth daily.  ? clopidogrel (PLAVIX) 75 MG tablet TAKE 1 TABLET DAILY  ? Coenzyme Q10 (CO Q10) 100 MG CAPS Take 100 mg by mouth daily.  ? colchicine 0.6 MG tablet Take 0.6 mg by mouth daily.  ? ferrous sulfate  325 (65 FE) MG tablet Take 325 mg by mouth daily with breakfast.  ? folic acid (FOLVITE) 1 MG tablet Take 1 tablet (1 mg total) by mouth daily.  ? Glucosamine 500 MG CAPS Take 500 mg by mouth daily.  ? isosorbide mononitrate (IMDUR) 60 MG 24 hr tablet Take 1.5 tablets (90 mg total) by mouth daily.  ? lisinopril (ZESTRIL) 40 MG tablet TAKE 1 TABLET DAILY  ? loratadine (CLARITIN) 10 MG tablet Take 10 mg by mouth daily as needed for allergies.  ? Multiple Vitamin (MULTIVITAMIN) capsule Take 1 capsule by mouth daily.  ? nitroGLYCERIN (NITROSTAT) 0.4 MG SL tablet PLACE 1 TABLET UNDER THE TONGUE EVERY 5 MINUTES AS NEEDED FOR CHEST PAIN. NOT TO EXCEED 3 DOSES (Patient taking differently: Place 0.4 mg under the tongue every 5 (five) minutes as needed for chest pain.)  ? Omega-3 Fatty Acids (FISH OIL) 1000 MG CAPS Take 1,000 mg by mouth daily.  ? ranolazine (RANEXA) 1000 MG SR tablet TAKE 1 TABLET TWICE A DAY  ? simvastatin (ZOCOR) 20 MG tablet TAKE 1 TABLET DAILY  ? thiamine 100 MG tablet Take 1 tablet (100 mg total) by mouth daily.  ? vitamin C (ASCORBIC ACID) 500 MG tablet Take 500 mg by mouth daily.  ? zinc sulfate 220 (50 Zn) MG capsule Take 1 capsule (220 mg total) by mouth daily.  ? ?Current Facility-Administered Medications for the 11/10/21 encounter (Office Visit) with Josue Hector, MD  ?Medication  ? 0.9 %  sodium chloride infusion  ? 0.9 %  sodium chloride infusion  ?  ? ?Allergies:   Beta adrenergic blockers  ? ?Social History  ? ?Tobacco Use  ? Smoking status: Never  ? Smokeless tobacco: Never  ?Vaping Use  ? Vaping Use: Never used  ?Substance Use Topics  ? Alcohol use: Yes  ?  Alcohol/week: 15.0 standard drinks  ?  Types: 15 Glasses of wine per week  ? Drug use: No  ?  ? ?Family Hx: ?The patient's family history includes Cancer (age of onset: 67) in his sister; Diabetes in his mother and sister; Heart disease in his sister. ? ?ROS:   ?Please see the history of present illness.    ? ?All other systems reviewed  and are negative. ? ? ?Prior CV studies:   ?The following studies were reviewed today: ? ?Carotid Duplex:  01/23/18  40-59% RICA left CEA patent plaque no stenosis  ? ?LHC: 02/27/17 ?  ?Conclusion  ?  ?  ?Prox RCA to Mid RCA lesion, 0 %stenosed. ?Mid RCA lesion, 30 %stenosed. ?Dist RCA lesion, 70 %stenosed. ?Ost Cx to Prox Cx lesion, 100 %stenosed. ?Dist LAD lesion, 100 %stenosed. ?Mid LAD lesion, 50 %stenosed. ?Ost LAD  to Prox LAD lesion, 60 %stenosed. ?Ramus-2 lesion, 70 %stenosed. ?A STENT SYNERGY DES 3X32 drug eluting stent was successfully placed. ?Ramus-1 lesion, 80 %stenosed. ?Post intervention, there is a 0% residual stenosis. ?A STENT SYNERGY DES 3X24 drug eluting stent was successfully placed, and overlaps previously placed stent. ?Ost Ramus lesion, 95 %stenosed. ?Post intervention, there is a 0% residual stenosis. ?  ?Successful complex PCI to the ramus intermediate vessel which had a 95% ostial stenosis followed by diffuse 80% proximal stenosis and 70% mid stenosis.  The three lesion were treated with high-speed rotational atherectomy with a 1.5 mm and a 1.75 mm burr,  cutting balloon atherotomy with a 3.0?10 mm Wolverine balloon, and tandem DES stenting with a 3.0?32 mm and 3.0?24 mm Synergy DES stents postdilated to 3.28 m millimeters ostially to 3.21 mm distally with the entire segments being reduced to 0%.  ?  ?RECOMMENDATION: ?Lifelong DAPT.  Continue aggressive medical therapy for the patient's severe concomitant CAD.  ? ? ? ?Labs/Other Tests and Data Reviewed:   ?  ?EKG:   SR rate 56 PR 328 otherwise normal 04/20/20  ? ?Recent Labs: ?12/17/2020: Magnesium 1.7; TSH 2.254 ?12/18/2020: ALT 17; BUN 40; Creatinine, Ser 1.48; Hemoglobin 9.9; Platelets 144; Potassium 4.4; Sodium 135  ? ?Recent Lipid Panel ?Lab Results  ?Component Value Date/Time  ? CHOL 109 02/21/2017 02:04 AM  ? TRIG 52 02/21/2017 02:04 AM  ? HDL 46 02/21/2017 02:04 AM  ? CHOLHDL 2.4 02/21/2017 02:04 AM  ? LDLCALC 53 02/21/2017 02:04 AM   ? ? ?Wt Readings from Last 3 Encounters:  ?11/10/21 146 lb (66.2 kg)  ?05/12/21 151 lb 9.6 oz (68.8 kg)  ?12/17/20 158 lb 15.2 oz (72.1 kg)  ?  ? ?Objective:   ? ?Vital Signs:  BP 112/62   Pulse 70   Ht 5'

## 2021-11-10 ENCOUNTER — Ambulatory Visit (INDEPENDENT_AMBULATORY_CARE_PROVIDER_SITE_OTHER): Payer: Medicare Other | Admitting: Cardiovascular Disease

## 2021-11-10 ENCOUNTER — Encounter: Payer: Self-pay | Admitting: Cardiovascular Disease

## 2021-11-10 ENCOUNTER — Other Ambulatory Visit: Payer: Self-pay

## 2021-11-10 VITALS — BP 112/62 | HR 70 | Ht 66.0 in | Wt 146.0 lb

## 2021-11-10 DIAGNOSIS — I6523 Occlusion and stenosis of bilateral carotid arteries: Secondary | ICD-10-CM

## 2021-11-10 DIAGNOSIS — I10 Essential (primary) hypertension: Secondary | ICD-10-CM

## 2021-11-10 DIAGNOSIS — M25552 Pain in left hip: Secondary | ICD-10-CM

## 2021-11-10 DIAGNOSIS — I2510 Atherosclerotic heart disease of native coronary artery without angina pectoris: Secondary | ICD-10-CM | POA: Diagnosis not present

## 2021-11-10 NOTE — Patient Instructions (Signed)

## 2022-02-01 ENCOUNTER — Encounter (INDEPENDENT_AMBULATORY_CARE_PROVIDER_SITE_OTHER): Payer: Medicare Other | Admitting: Ophthalmology

## 2022-02-06 DIAGNOSIS — D631 Anemia in chronic kidney disease: Secondary | ICD-10-CM | POA: Insufficient documentation

## 2022-02-06 DIAGNOSIS — H3530 Unspecified macular degeneration: Secondary | ICD-10-CM | POA: Insufficient documentation

## 2022-04-10 ENCOUNTER — Other Ambulatory Visit: Payer: Self-pay | Admitting: Cardiovascular Disease

## 2022-04-10 ENCOUNTER — Other Ambulatory Visit: Payer: Self-pay

## 2022-04-10 DIAGNOSIS — R0989 Other specified symptoms and signs involving the circulatory and respiratory systems: Secondary | ICD-10-CM

## 2022-04-10 DIAGNOSIS — Z8673 Personal history of transient ischemic attack (TIA), and cerebral infarction without residual deficits: Secondary | ICD-10-CM | POA: Insufficient documentation

## 2022-04-17 ENCOUNTER — Ambulatory Visit: Payer: Medicare Other | Admitting: Cardiovascular Disease

## 2022-05-16 ENCOUNTER — Encounter (HOSPITAL_COMMUNITY): Payer: Medicare Other

## 2022-05-16 ENCOUNTER — Ambulatory Visit (HOSPITAL_COMMUNITY)
Admission: RE | Admit: 2022-05-16 | Discharge: 2022-05-16 | Disposition: A | Discharge status: Home / Self Care | Payer: Medicare Other | Source: Ambulatory Visit | Attending: Cardiology | Admitting: Cardiology

## 2022-05-16 DIAGNOSIS — I6521 Occlusion and stenosis of right carotid artery: Secondary | ICD-10-CM | POA: Insufficient documentation

## 2022-05-16 DIAGNOSIS — Z9889 Other specified postprocedural states: Secondary | ICD-10-CM | POA: Insufficient documentation

## 2022-05-24 ENCOUNTER — Other Ambulatory Visit: Payer: Self-pay | Admitting: Cardiovascular Disease

## 2022-06-07 ENCOUNTER — Ambulatory Visit (INDEPENDENT_AMBULATORY_CARE_PROVIDER_SITE_OTHER): Payer: Medicare Other | Admitting: Orthopaedic Surgery

## 2022-06-07 DIAGNOSIS — I6523 Occlusion and stenosis of bilateral carotid arteries: Secondary | ICD-10-CM

## 2022-06-07 DIAGNOSIS — M1612 Unilateral primary osteoarthritis, left hip: Secondary | ICD-10-CM | POA: Diagnosis not present

## 2022-06-07 NOTE — Progress Notes (Signed)
The patient has some neuropathy in the past.  He is 88 years old and has well-documented severe end-stage arthritis of his left hip.  He does ambulate with a cane.  He has had an intra-articular injection of steroid in the left hip and he said that did not help him at all.  He is on Plavix.  He does have a cardiac history and sees Dr. Jenkins Rouge.  The last time he saw Dr. Johnsie Cancel was earlier this year.  On exam his right hip has good motion with minimal discomfort.  His left hip has significant limitations with rotation and severe pain in the groin with rotation.  He does ambulate slowly using a cane.  He still tries to bowl on occasion.  His previous x-rays show severe end-stage arthritis of his left hip with significantly almost no joint space remaining at all.  There is bone-on-bone wear and sclerotic changes.  I did speak to him in length in detail about hip replacement surgery.  I gave him a handout about this as well.  However he would need to stop Plavix at least a week before surgery and he definitely needs cardiac clearance before this type of surgery.  I gave him a note to give to his cardiologist with instructions about the potential for him being cleared for the surgery in light of his comorbidities and advanced age.  At this point his hip pain is daily and is detrimentally affecting his mobility, his quality of life and his actives daily living to the point he would like to consider this type of surgery.  He will contact us once he has seen his cardiologist.

## 2022-06-10 NOTE — Progress Notes (Addendum)
Cardiology Office Note:    Date:  06/13/2022   ID:  [name], DOB [birth year], MRN [id number]  PCP:  [name], MD   [facility] Providers Cardiologist:  Jenkins Rouge, MD     Referring MD: [name], MD   Chief Complaint: 6 month follow-up  History of Present Illness:    [name] is a very pleasant 88 y.o. male with a hx of CAD s/p DES to RCA July (12/7320) complicated by radial AV fistual, carotid artery disease s/p L CEA, HTN, HLD, and CVA.    Admission 02/2017 for [place]. Troponins were negative.  He underwent cardiac catheterization which revealed stable severe CAD (mod dRCA, occluded LCx, high-grade ostial, proximal and mid ramus stenosis as well as scattered noncritical LAD disease and apical LAD occlusion) and a patent RCA stent. He was discharged on medical therapy. Was discharged home, seen by PCP for what was thought to be a gout flare and started on colchicine and Tylenol.  He subsequently went home where a short time later he had acute onset of midsternal chest pain 8/10 which did not radiate and was persistent. Return to ED with EKG that revealed significant inferior lateral ST depression with ST elevation in aVR, which was a significant change compared to admission EKG on 02/20/2017.  Chest pain improved on arrival and initial troponin was negative, however cardiology was asked to evaluate for unstable angina.  He underwent repeat LHC with successful Rotablator guided PCI of the ostial mid LAD with Dr. Claiborne Billings on 02/27/2017.  Lifelong DAPT recommended.  History of AV block on beta-blockers.  He has a residual fistula in right radial artery that VVS follows and reports no need for correction, no steal.  R ICA occluded by duplex 05/16/2021 with left CEA site wide open.   Hospital admission 12/18/2020 with multifocal pneumonia/COVID as well as slow A-fib treated with steroids and remdesivir.  Found to have A-fib at a well-controlled ventricular rate, not thought to be a  long-term anticoagulation candidate due to frequent falls.  He was last seen in cardiology clinic on 11/10/2021 by Dr. Johnsie Cancel at which time he was doing well with chief complaint hip pain.  Advised to return in 6 months. Scheduled for repeat carotid duplex 04/2022.   Today, he is here alone. He appears quite frail. Needed assistance with a wheelchair to get into the exam room. Drove himself here and says he was very winded walking in from the parking lot. He is here for preoperative cardiac clearance for upcoming hip surgery.  He is living with his daughter and grandson. Not very active at home, goes up and down stairs but does not prepare own meals or do any house work. Uses a tripod walker. Had a fall recently. Denies dizziness, lightheadedness presyncope, and syncope. Resting HR typically in the 50's. He denies chest pain, lower extremity edema, fatigue, palpitations, melena, hematuria, hemoptysis, diaphoresis, orthopnea, and PND.  Past Medical History:  Diagnosis Date   AV BLOCK, 1ST DEGREE    Carotid artery disease ([place])    a. s/p Left ECA followed by Dr. Donnetta Hutching   Coronary artery disease    Coronary artery disease involving coronary bypass graft of native heart with angina pectoris ([place])    a. LHC 12/2015  3vd Left Cx 100% with colaterals, and distally LAD occluded, DES to prox-mid RCA  b. 02/2017: repeat cath with patent RCA stent and stable CAD; 02/27/2017: Rota-PCI to Ost RI   GERD (gastroesophageal reflux disease)  HLD (hyperlipidemia)    HTN (hypertension)    NSVT (nonsustained ventricular tachycardia) (HCC)    during stress test 12/2015   Presence of drug coated stent in RCA & Ramus Intermedius. 02/28/2017   12/2015: PCI p-mRCA - Stent Resolute Integ 3.5x34 02/2017: Rota-PCI o-mRamus - overlapping DES STENT SYNERGY DES 3X32  & 3x24 (tapered post-dilation)   Stroke [facility])     Past Surgical History:  Procedure Laterality Date   CARDIAC CATHETERIZATION     x2   CARDIAC CATHETERIZATION N/A  01/11/2016   Procedure: Left Heart Cath and Coronary Angiography;  Surgeon: Wellington Hampshire, MD;  Location: [place] [facility];  Service: Cardiovascular;  Laterality: N/A;   CAROTID ENDARTERECTOMY  11/28/2006   left   CORONARY ATHERECTOMY N/A 02/27/2017   Procedure: Coronary Atherectomy;  Surgeon: Troy Sine, MD;  Location: [place] [facility];  Service: Cardiovascular;  Laterality: N/A;   HERNIA REPAIR     LEFT HEART CATH AND CORONARY ANGIOGRAPHY N/A 02/21/2017   Procedure: Left Heart Cath and Coronary Angiography;  Surgeon: Lorretta Harp, MD;  Location: [place] [facility];  Service: Cardiovascular;  Laterality: N/A;   TONSILLECTOMY      Current Medications: Current Meds  Medication Sig   acetaminophen (TYLENOL) 500 MG tablet Take 1 tablet (500 mg total) by mouth every 6 (six) hours as needed for moderate pain (wrist).   allopurinol (ZYLOPRIM) 100 MG tablet Take 100 mg by mouth daily.   aspirin EC 81 MG EC tablet Take 1 tablet (81 mg total) by mouth daily.   b complex vitamins tablet Take 1 tablet by mouth daily.   Calcium Carbonate (CALCIUM 500 PO) Take 500 mg by mouth daily.   Cholecalciferol (VITAMIN D-3) 1000 units CAPS Take 1,000 Units by mouth daily.   clopidogrel (PLAVIX) 75 MG tablet TAKE 1 TABLET DAILY   Coenzyme Q10 (CO Q10) 100 MG CAPS Take 100 mg by mouth daily.   colchicine 0.6 MG tablet Take 0.6 mg by mouth daily.   ferrous sulfate 325 (65 FE) MG tablet Take 325 mg by mouth daily with breakfast.   folic acid (FOLVITE) 1 MG tablet Take 1 tablet (1 mg total) by mouth daily.   Glucosamine 500 MG CAPS Take 500 mg by mouth daily.   isosorbide mononitrate (IMDUR) 60 MG 24 hr tablet TAKE ONE AND ONE-HALF TABLETS DAILY   loratadine (CLARITIN) 10 MG tablet Take 10 mg by mouth daily as needed for allergies.   Multiple Vitamin (MULTIVITAMIN) capsule Take 1 capsule by mouth daily.   nitroGLYCERIN (NITROSTAT) 0.4 MG SL tablet PLACE 1 TABLET UNDER THE TONGUE EVERY 5 MINUTES AS  NEEDED FOR CHEST PAIN. NOT TO EXCEED 3 DOSES (Patient taking differently: Place 0.4 mg under the tongue every 5 (five) minutes as needed for chest pain.)   Omega-3 Fatty Acids (FISH OIL) 1000 MG CAPS Take 1,000 mg by mouth daily.   ranolazine (RANEXA) 1000 MG SR tablet TAKE 1 TABLET TWICE A DAY   simvastatin (ZOCOR) 20 MG tablet TAKE 1 TABLET DAILY   thiamine 100 MG tablet Take 1 tablet (100 mg total) by mouth daily.   vitamin C (ASCORBIC ACID) 500 MG tablet Take 500 mg by mouth daily.   zinc sulfate 220 (50 Zn) MG capsule Take 1 capsule (220 mg total) by mouth daily.   [DISCONTINUED] lisinopril (ZESTRIL) 40 MG tablet TAKE 1 TABLET DAILY   Current Facility-Administered Medications for the 06/13/22 encounter (Office Visit) with Ann Maki, Lanice Schwab, NP  Medication   0.9 %  sodium chloride infusion   0.9 %  sodium chloride infusion     Allergies:   Beta adrenergic blockers   Social History   Socioeconomic History   Marital status: Widowed    Spouse name: Not on file   Number of children: Not on file   Years of education: Not on file   Highest education level: Not on file  Occupational History   Not on file  Tobacco Use   Smoking status: Never   Smokeless tobacco: Never  Vaping Use   Vaping Use: Never used  Substance and Sexual Activity   Alcohol use: Yes    Alcohol/week: 15.0 standard drinks of alcohol    Types: 15 Glasses of wine per week   Drug use: No   Sexual activity: Not on file  Other Topics Concern   Not on file  Social History Narrative   Not on file   Social Determinants of Health   Financial Resource Strain: Not on file  Food Insecurity: Not on file  Transportation Needs: Not on file  Physical Activity: Not on file  Stress: Not on file  Social Connections: Not on file     Family History: The patient's family history includes Cancer (age of onset: 4) in his sister; Diabetes in his mother and sister; Heart disease in his sister.  ROS:   Please see the  history of present illness.    + DOE + weakness All other systems reviewed and are negative.  Labs/Other Studies Reviewed:    The following studies were reviewed today:  Carotid Duplex 05/17/22  Right Carotid: Evidence consistent with a total occlusion of the right  ICA.                  The ECA appears >50% stenosed.   Left Carotid: Velocities in the left ICA are consistent with a 1-39%  stenosis.   Vertebrals: Bilateral vertebral arteries demonstrate antegrade flow.  Subclavians: Normal flow hemodynamics were seen in bilateral subclavian               arteries.   *See table(s) above for measurements and observations.     Electronically signed by Ida Rogue MD on 05/17/2022 at 2:40:59 PM.     Coronary atherectomy 02/27/2017  Prox RCA to Mid RCA lesion, 0 %stenosed. Mid RCA lesion, 30 %stenosed. Dist RCA lesion, 70 %stenosed. Ost Cx to Prox Cx lesion, 100 %stenosed. Dist LAD lesion, 100 %stenosed. Mid LAD lesion, 50 %stenosed. Ost LAD to Prox LAD lesion, 60 %stenosed. Ramus-2 lesion, 70 %stenosed. A STENT SYNERGY DES 3X32 drug eluting stent was successfully placed. Ramus-1 lesion, 80 %stenosed. Post intervention, there is a 0% residual stenosis. A STENT SYNERGY DES 3X24 drug eluting stent was successfully placed, and overlaps previously placed stent. Ost Ramus lesion, 95 %stenosed. Post intervention, there is a 0% residual stenosis.   Successful complex PCI to the ramus intermediate vessel which had a 95% ostial stenosis followed by diffuse 80% proximal stenosis and 70% mid stenosis.  The three lesion were treated with high-speed rotational atherectomy with a 1.5 mm and a 1.75 mm burr,  cutting balloon atherotomy with a 3.010 mm Wolverine balloon, and tandem DES stenting with a 3.032 mm and 3.024 mm Synergy DES stents postdilated to 3.28 m millimeters ostially to 3.21 mm distally with the entire segments being reduced to 0%.    RECOMMENDATION: Lifelong DAPT.   Continue aggressive medical therapy for the patient's severe concomitant CAD.  Diagnostic Dominance: Right  Intervention     Recent Labs: No results found for requested labs within last 365 days.  Recent Lipid Panel    Component Value Date/Time   CHOL 109 02/21/2017 0204   TRIG 52 02/21/2017 0204   HDL 46 02/21/2017 0204   CHOLHDL 2.4 02/21/2017 0204   VLDL 10 02/21/2017 0204   LDLCALC 53 02/21/2017 0204     Risk Assessment/Calculations:    CHA2DS2-VASc Score = 6  This indicates a 9.7% annual risk of stroke. The patient's score is based upon: CHF History: 0 HTN History: 1 Diabetes History: 0 Stroke History: 2 Vascular Disease History: 1 Age Score: 2 Gender Score: 0   Felt not to be candidate for [place] due to frequent falls     Physical Exam:    VS:  BP (!) 120/48   Pulse (!) 54   Ht '5\' 5"'$  (1.651 m)   Wt 138 lb (62.6 kg)   SpO2 96%   BMI 22.96 kg/m     Wt Readings from Last 3 Encounters:  06/13/22 138 lb (62.6 kg)  11/10/21 146 lb (66.2 kg)  05/12/21 151 lb 9.6 oz (68.8 kg)     GEN:  Well nourished, well developed in no acute distress HEENT: Normal NECK: No JVD; No carotid bruits CARDIAC: Irregular RR, no murmurs, rubs, gallops RESPIRATORY:  Clear to auscultation without rales, wheezing or rhonchi  ABDOMEN: Soft, non-tender, non-distended MUSCULOSKELETAL:  No edema; No deformity. 2+ pedal pulses, equal bilaterally SKIN: Warm and dry NEUROLOGIC:  Alert and oriented x 3 PSYCHIATRIC:  Normal affect   EKG:  EKG is ordered today.  The ekg ordered today demonstrates coarse atrial fibrillation at 54 bpm with PVC versus aberrantly conducted beat  Diagnoses:    1. Coronary artery disease involving native coronary artery of native heart without angina pectoris   2. Bilateral carotid artery stenosis   3. Primary hypertension   4. Preoperative cardiovascular examination   5. Persistent atrial fibrillation ([place])   6. Hyperlipidemia LDL goal <70     Assessment and Plan:     Preoperative cardiac evaluation: Agreement that he may proceed with surgery at March 2023 office visit with Dr. Johnsie Cancel, pending normal nuclear stress test. His RCRI for MACE is 11%.  He is unable to achieve > 4 METS activity. We will get lexiscan myoview for evaluation of ischemia. Due to length of stenting in RCA, I will verify with Dr. Johnsie Cancel that he may hold Plavix for 5 days prior to surgery per request of Dr. Ninfa Linden. ADDENDUM: Nuclear stress test 06/18/2022 revealed no evidence of ischemia.  He may proceed with surgery without further testing. Per Dr. Johnsie Cancel, he may hold Plavix for 5 days prior to surgery.   CAD without angina: History of multiple stents, most recent complex rotational atherectomy guided PCI/DES x  to ramus intermediate vessel. Lifelong DAPT recommended. He denies chest pain. Reports dyspnea is chronic and is stable. No orthopnea, PND, or edema. We are getting nuclear stress test in the setting of preoperative cardiac evaluation for upcoming hip surgery.  Hypertension: BP is low today. We will reduce his lisinopril to 20 mg daily. He monitors BP at home. Advised him to notify us with BP concerns prior to next office visit.   Atrial fibrillation with slow ventricular response: EKG reveals a fib at 54 bpm with PVC versus aberrant conducted beat. He denies lightheadedness, presyncope, syncope.  Advised him to notify us if he develops any of the symptoms.  He is not on AV nodal blocking agents.  Hyperlipidemia LDL goal < 70: LDL 36 on 07/31/21. Continue simvastatin.   Carotid artery disease: s/p L CEA 2008, known right ICA occlusion. Stable disease on carotid duplex 04/2022. Left ICA with 1 to 39% stenosis, total occlusion of right ICA, right ECA greater than 50% stenosed. Will repeat if clinically indicated.      Disposition: 6 months with Dr. Johnsie Cancel  Medication Adjustments/Labs and Tests Ordered: Current medicines are reviewed at length with the  patient today.  Concerns regarding medicines are outlined above.  Orders Placed This Encounter  Procedures   Cardiac Stress Test: Informed Consent Details: Physician/Practitioner Attestation; Transcribe to consent form and obtain patient signature   Myocardial Perfusion Imaging   EKG 12-Lead   Meds ordered this encounter  Medications   lisinopril (ZESTRIL) 20 MG tablet    Sig: Take 1 tablet (20 mg total) by mouth daily.    Dispense:  90 tablet    Refill:  3    Patient Instructions  Medication Instructions:   DECREASE Lisinopril one ( 1) tablet by mouth ( 20 mg) daily.  You can cut your (40 mg) tablet in half and take one half tablet daily to use them up.   *If you need a refill on your cardiac medications before your next appointment, please call your pharmacy*   Lab Work:  None ordered.  If you have labs (blood work) drawn today and your tests are completely normal, you will receive your results only by: [place] (if you have MyChart) OR A paper copy in the mail If you have any lab test that is abnormal or we need to change your treatment, we will call you to review the results.   Testing/Procedures: You are scheduled for a Myocardial Perfusion Imaging Study on Monday, October 30  at 10:15 am.   Please arrive 15 minutes prior to your appointment time for registration and insurance purposes.   The test will take approximately 3 to 4 hours to complete; you may bring reading material. If someone comes with you to your appointment, they will need to remain in the main lobby due to limited space in the testing area.   How to prepare for your Myocardial Perfusion test:   Do not eat or drink 3 hours prior to your test, except you may have water.    Do not consume products containing caffeine (regular or decaffeinated) 12 hours prior to your test (ex: coffee, chocolate, soda, tea)   Do bring a list of your current medications with you. If not listed below, you may take  your medications as normal.   Bring any held medication to your appointment, as you may be required to take it once the test is complete.   Do wear comfortable clothes (no overalls) and walking shoes. Tennis shoes are preferred. No open toed shoes.  Do not wear cologne, aftershave or lotions (deodorant is allowed).   If these instructions are not followed, you test will have to be rescheduled.   Please report to [address] for your test. If you have questions or concerns about your appointment, please call the Nuclear Lab at [phone].  If you cannot keep your appointment, please provide 24 hour notification to the Nuclear lab to avoid a possible $50 charge to your account.       Follow-Up: At [facility], you and your health needs are our priority.  As part of our continuing  mission to provide you with exceptional heart care, we have created designated Provider Care Teams.  These Care Teams include your primary Cardiologist (physician) and Advanced Practice Providers (APPs -  Physician Assistants and Nurse Practitioners) who all work together to provide you with the care you need, when you need it.  We recommend signing up for the patient portal called "MyChart".  Sign up information is provided on this After Visit Summary.  MyChart is used to connect with patients for Virtual Visits (Telemedicine).  Patients are able to view lab/test results, encounter notes, upcoming appointments, etc.  Non-urgent messages can be sent to your provider as well.   To learn more about what you can do with MyChart, go to [email or web address].    Your next appointment:   6 month(s)  The format for your next appointment:   In Person  Provider:   Jenkins Rouge, MD     Other Instructions  Your physician wants you to follow-up in: 6 months with Dr. Johnsie Cancel.  You will receive a reminder letter in the mail two months in advance. If you don't receive a letter, please  call our office to schedule the follow-up appointment.   Important Information About Sugar         Signed, Emmaline Life, NP  06/13/2022 5:03 PM    [place]

## 2022-06-13 ENCOUNTER — Encounter: Payer: Self-pay | Admitting: Nurse Practitioner

## 2022-06-13 ENCOUNTER — Ambulatory Visit: Payer: Medicare Other | Attending: Nurse Practitioner | Admitting: Nurse Practitioner

## 2022-06-13 VITALS — BP 120/48 | HR 54 | Ht 65.0 in | Wt 138.0 lb

## 2022-06-13 DIAGNOSIS — I6523 Occlusion and stenosis of bilateral carotid arteries: Secondary | ICD-10-CM | POA: Diagnosis present

## 2022-06-13 DIAGNOSIS — Z01810 Encounter for preprocedural cardiovascular examination: Secondary | ICD-10-CM | POA: Diagnosis present

## 2022-06-13 DIAGNOSIS — I2510 Atherosclerotic heart disease of native coronary artery without angina pectoris: Secondary | ICD-10-CM | POA: Diagnosis present

## 2022-06-13 DIAGNOSIS — I10 Essential (primary) hypertension: Secondary | ICD-10-CM

## 2022-06-13 DIAGNOSIS — E785 Hyperlipidemia, unspecified: Secondary | ICD-10-CM

## 2022-06-13 DIAGNOSIS — I4819 Other persistent atrial fibrillation: Secondary | ICD-10-CM

## 2022-06-13 MED ORDER — LISINOPRIL 20 MG PO TABS: 20.0000 mg | ORAL_TABLET | Freq: Every day | ORAL | 3 refills | Status: DC

## 2022-06-13 NOTE — Patient Instructions (Signed)
Medication Instructions:   DECREASE Lisinopril one ( 1) tablet by mouth ( 20 mg) daily.  You can cut your (40 mg) tablet in half and take one half tablet daily to use them up.   *If you need a refill on your cardiac medications before your next appointment, please call your pharmacy*   Lab Work:  None ordered.  If you have labs (blood work) drawn today and your tests are completely normal, you will receive your results only by: [place] (if you have MyChart) OR A paper copy in the mail If you have any lab test that is abnormal or we need to change your treatment, we will call you to review the results.   Testing/Procedures: You are scheduled for a Myocardial Perfusion Imaging Study on Monday, October 30  at 10:15 am.   Please arrive 15 minutes prior to your appointment time for registration and insurance purposes.   The test will take approximately 3 to 4 hours to complete; you may bring reading material. If someone comes with you to your appointment, they will need to remain in the main lobby due to limited space in the testing area.   How to prepare for your Myocardial Perfusion test:   Do not eat or drink 3 hours prior to your test, except you may have water.    Do not consume products containing caffeine (regular or decaffeinated) 12 hours prior to your test (ex: coffee, chocolate, soda, tea)   Do bring a list of your current medications with you. If not listed below, you may take your medications as normal.   Bring any held medication to your appointment, as you may be required to take it once the test is complete.   Do wear comfortable clothes (no overalls) and walking shoes. Tennis shoes are preferred. No open toed shoes.  Do not wear cologne, aftershave or lotions (deodorant is allowed).   If these instructions are not followed, you test will have to be rescheduled.   Please report to [address] for your test. If you have questions or  concerns about your appointment, please call the Nuclear Lab at [phone].  If you cannot keep your appointment, please provide 24 hour notification to the Nuclear lab to avoid a possible $50 charge to your account.       Follow-Up: At [facility] you and your health needs are our priority.  As part of our continuing mission to provide you with exceptional heart care, we have created designated Provider Care Teams.  These Care Teams include your primary Cardiologist (physician) and Advanced Practice Providers (APPs -  Physician Assistants and Nurse Practitioners) who all work together to provide you with the care you need, when you need it.  We recommend signing up for the patient portal called "MyChart".  Sign up information is provided on this After Visit Summary.  MyChart is used to connect with patients for Virtual Visits (Telemedicine).  Patients are able to view lab/test results, encounter notes, upcoming appointments, etc.  Non-urgent messages can be sent to your provider as well.   To learn more about what you can do with MyChart, go to [email or web address].    Your next appointment:   6 month(s)  The format for your next appointment:   In Person  Provider:   Jenkins Rouge, MD     Other Instructions  Your physician wants you to follow-up in: 6 months with Dr. Johnsie Cancel.  You will receive a reminder  letter in the mail two months in advance. If you don't receive a letter, please call our office to schedule the follow-up appointment.   Important Information About Sugar

## 2022-06-14 ENCOUNTER — Telehealth (HOSPITAL_COMMUNITY): Payer: Self-pay | Admitting: *Deleted

## 2022-06-14 NOTE — Telephone Encounter (Signed)
Patient given detailed instructions per Myocardial Perfusion Study Information Sheet for the test on 06/18/22 at 10:15. Patient notified to arrive 15 minutes early and that it is imperative to arrive on time for appointment to keep from having the test rescheduled.  If you need to cancel or reschedule your appointment, please call the office within 24 hours of your appointment. . Patient verbalized understanding.[name]

## 2022-06-18 ENCOUNTER — Ambulatory Visit (HOSPITAL_COMMUNITY): Payer: Medicare Other | Attending: Cardiovascular Disease

## 2022-06-18 DIAGNOSIS — I10 Essential (primary) hypertension: Secondary | ICD-10-CM | POA: Insufficient documentation

## 2022-06-18 DIAGNOSIS — I6523 Occlusion and stenosis of bilateral carotid arteries: Secondary | ICD-10-CM | POA: Insufficient documentation

## 2022-06-18 DIAGNOSIS — Z01810 Encounter for preprocedural cardiovascular examination: Secondary | ICD-10-CM | POA: Insufficient documentation

## 2022-06-18 DIAGNOSIS — I2510 Atherosclerotic heart disease of native coronary artery without angina pectoris: Secondary | ICD-10-CM | POA: Insufficient documentation

## 2022-06-18 LAB — MYOCARDIAL PERFUSION IMAGING
LV dias vol: 121 mL (ref 62–150)
LV sys vol: 42 mL
Nuc Stress EF: 66 %
Peak HR: 57 {beats}/min
Rest HR: 43 {beats}/min
Rest Nuclear Isotope Dose: 8.1 mCi
SDS: 5
SRS: 7
SSS: 11
ST Depression (mm): 0 mm
Stress Nuclear Isotope Dose: 27.3 mCi
TID: 1.01

## 2022-06-18 MED ORDER — TECHNETIUM TC 99M TETROFOSMIN IV KIT
8.1000 | PACK | Freq: Once | INTRAVENOUS | Status: AC | PRN
  Administered 2022-06-18: 8.1 via INTRAVENOUS

## 2022-06-18 MED ORDER — REGADENOSON 0.4 MG/5ML IV SOLN
0.4000 mg | Freq: Once | INTRAVENOUS | Status: AC
  Administered 2022-06-18: 0.4 mg via INTRAVENOUS

## 2022-06-18 MED ORDER — TECHNETIUM TC 99M TETROFOSMIN IV KIT
27.3000 | PACK | Freq: Once | INTRAVENOUS | Status: AC | PRN
  Administered 2022-06-18: 27.3 via INTRAVENOUS

## 2022-06-27 ENCOUNTER — Ambulatory Visit: Payer: Medicare Other | Admitting: Orthopaedic Surgery

## 2022-07-17 ENCOUNTER — Encounter: Payer: Self-pay | Admitting: Orthopaedic Surgery

## 2022-07-17 ENCOUNTER — Ambulatory Visit (INDEPENDENT_AMBULATORY_CARE_PROVIDER_SITE_OTHER): Payer: Medicare Other | Admitting: Orthopaedic Surgery

## 2022-07-17 DIAGNOSIS — M1612 Unilateral primary osteoarthritis, left hip: Secondary | ICD-10-CM

## 2022-07-17 DIAGNOSIS — I6523 Occlusion and stenosis of bilateral carotid arteries: Secondary | ICD-10-CM

## 2022-07-17 NOTE — Progress Notes (Signed)
Office Visit Note   Patient: [name]           Date of Birth: [birth year]           MRN: [id number] Visit Date: 07/17/2022              Requested by: [name], MD [address],  [place] [address] PCP: [name], MD   Assessment & Plan: Visit Diagnoses:  1. Unilateral primary osteoarthritis, left hip     Plan:  Given patient's instability to arthritis of the left hip is affecting his quality of life.  The fact that he has severe pain with ambulation despite conservative measures.  Recommend left total hip arthroplasty.  He understands he is at increased risk due to his age and comorbidities.  However he has obtained clearance from his cardiologist.  Therefore we will proceed with left total hip arthroplasty in the near future.  Risk benefits of surgery discussed with patient.  Postoperative protocol discussed with patient.  Risk include is not limited to nerve/vessel injury, blood loss, wound healing problems, infection, DVT/PE, and leg length discrepancy.  See him back 2 weeks postop.  Questions were encouraged and answered by Dr. Delilah Shan itself.  Follow-Up Instructions: Return for post op.   Orders:  No orders of the defined types were placed in this encounter.  No orders of the defined types were placed in this encounter.     Procedures: No procedures performed   Clinical Data: No additional findings.   Subjective: Chief Complaint  Patient presents with   Left Hip - Pain    HPI [name] returns today wanting to schedule left hip arthroplasty.  He states he has been cleared by his cardiologist [name] for surgery.  He has had no new injury to the hip.  States he has 8-9 out of 10 pain in the hip whenever ambulating.  He has found it is beneficial for him to use a walker to ambulate.  He has been taking ibuprofen for his pain.  He did stop his Plavix some 2 weeks ago planning for hip surgery.  Denies any chest pain.  He has  chronic shortness of breath with exertion especially whenever he climbs stairs he usually climbs total of 14 steps in his home.  He does live with his daughter and grandson.  He denies any ongoing infection.  Radiographs of his left hip in the past were showing bone-on-bone end-stage arthritis left hip. Review of Systems See HPI otherwise negative  Objective: Vital Signs: There were no vitals taken for this visit.  Physical Exam Constitutional:      Appearance: He is normal weight. He is not ill-appearing or diaphoretic.  Pulmonary:     Effort: Pulmonary effort is normal.  Neurological:     Mental Status: He is alert and oriented to person, place, and time.  Psychiatric:        Mood and Affect: Mood normal.     Ortho Exam Right hip good range of motion without pain.  Left hip diminished external rotation and virtually no internal rotation.  Attempts at range of motion causes discomfort.  Left calf supple nontender.  Dorsiflexion plantarflexion left ankle intact. Specialty Comments:  No specialty comments available.  Imaging: No results found.   PMFS History: Patient Active Problem List   Diagnosis Date Noted   History of stroke 04/10/2022   Anemia due to chronic kidney disease 02/06/2022   Macular degeneration  02/06/2022   Multifocal pneumonia 12/17/2020   Pneumonia due to COVID-19 virus 12/17/2020   New onset atrial fibrillation ([place]) 12/17/2020   CKD (chronic kidney disease), stage III ([facility]) 12/17/2020   Anemia in chronic kidney disease (CKD) 12/17/2020   Poor balance 10/06/2019   Laryngitis 04/17/2017   Cough 03/29/2017   Acute bronchitis 03/29/2017   Acute upper respiratory infection 03/29/2017   Presence of drug coated stent in RCA & Ramus Intermedius. 02/28/2017   Gout flare 02/22/2017   Coronary artery disease involving native coronary artery of native heart with angina pectoris ([facility])    Carotid artery disease (HCC)    HLD (hyperlipidemia)    Unstable angina  ([place]) 02/20/2017   Atherosclerosis of native coronary artery of native heart    Essential hypertension 01/06/2009   Second degree atrioventricular block, Mobitz type I 01/06/2009   AV block, 1st degree 01/06/2009   Past Medical History:  Diagnosis Date   AV BLOCK, 1ST DEGREE    Carotid artery disease (HCC)    a. s/p Left ECA followed by Dr. Donnetta Hutching   Coronary artery disease    Coronary artery disease involving coronary bypass graft of native heart with angina pectoris ([facility] [facility])    a. LHC 12/2015  3vd Left Cx 100% with colaterals, and distally LAD occluded, DES to prox-mid RCA  b. 02/2017: repeat cath with patent RCA stent and stable CAD; 02/27/2017: Rota-PCI to Ost RI   GERD (gastroesophageal reflux disease)    HLD (hyperlipidemia)    HTN (hypertension)    NSVT (nonsustained ventricular tachycardia) ([facility])    during stress test 12/2015   Presence of drug coated stent in RCA & Ramus Intermedius. 02/28/2017   12/2015: PCI p-mRCA - Stent Resolute Integ 3.5x34 02/2017: Rota-PCI o-mRamus - overlapping DES STENT SYNERGY DES 3X32  & 3x24 (tapered post-dilation)   Stroke ([facility])     Family History  Problem Relation Age of Onset   Diabetes Mother    Diabetes Sister    Cancer Sister 64       colon   Heart disease Sister     Past Surgical History:  Procedure Laterality Date   CARDIAC CATHETERIZATION     x2   CARDIAC CATHETERIZATION N/A 01/11/2016   Procedure: Left Heart Cath and Coronary Angiography;  Surgeon: Wellington Hampshire, MD;  Location: [facility];  Service: Cardiovascular;  Laterality: N/A;   CAROTID ENDARTERECTOMY  11/28/2006   left   CORONARY ATHERECTOMY N/A 02/27/2017   Procedure: Coronary Atherectomy;  Surgeon: Troy Sine, MD;  Location: [facility];  Service: Cardiovascular;  Laterality: N/A;   HERNIA REPAIR     LEFT HEART CATH AND CORONARY ANGIOGRAPHY N/A 02/21/2017   Procedure: Left Heart Cath and Coronary Angiography;  Surgeon: Lorretta Harp, MD;  Location: [facility];  Service: Cardiovascular;  Laterality: N/A;   TONSILLECTOMY     Social History   Occupational History   Not on file  Tobacco Use   Smoking status: Never   Smokeless tobacco: Never  Vaping Use   Vaping Use: Never used  Substance and Sexual Activity   Alcohol use: Yes    Alcohol/week: 15.0 standard drinks of alcohol    Types: 15 Glasses of wine per week   Drug use: No   Sexual activity: Not on file

## 2022-08-01 ENCOUNTER — Emergency Department (HOSPITAL_COMMUNITY): Payer: Medicare Other

## 2022-08-01 ENCOUNTER — Encounter (HOSPITAL_COMMUNITY): Payer: Self-pay

## 2022-08-01 ENCOUNTER — Other Ambulatory Visit: Payer: Self-pay

## 2022-08-01 ENCOUNTER — Emergency Department (HOSPITAL_COMMUNITY)
Admission: EM | Admit: 2022-08-01 | Discharge: 2022-08-02 | Disposition: A | Discharge status: Home / Self Care | Payer: Medicare Other | Attending: Emergency Medicine | Admitting: Emergency Medicine

## 2022-08-01 DIAGNOSIS — Y92009 Unspecified place in unspecified non-institutional (private) residence as the place of occurrence of the external cause: Secondary | ICD-10-CM | POA: Diagnosis not present

## 2022-08-01 DIAGNOSIS — Z7902 Long term (current) use of antithrombotics/antiplatelets: Secondary | ICD-10-CM | POA: Diagnosis not present

## 2022-08-01 DIAGNOSIS — I6782 Cerebral ischemia: Secondary | ICD-10-CM | POA: Diagnosis not present

## 2022-08-01 DIAGNOSIS — M25551 Pain in right hip: Secondary | ICD-10-CM | POA: Diagnosis not present

## 2022-08-01 DIAGNOSIS — W1839XA Other fall on same level, initial encounter: Secondary | ICD-10-CM | POA: Diagnosis not present

## 2022-08-01 DIAGNOSIS — M25521 Pain in right elbow: Secondary | ICD-10-CM | POA: Insufficient documentation

## 2022-08-01 DIAGNOSIS — Z7982 Long term (current) use of aspirin: Secondary | ICD-10-CM | POA: Diagnosis not present

## 2022-08-01 DIAGNOSIS — W19XXXA Unspecified fall, initial encounter: Secondary | ICD-10-CM

## 2022-08-01 MED ORDER — ACETAMINOPHEN 500 MG PO TABS
1000.0000 mg | ORAL_TABLET | Freq: Once | ORAL | Status: AC
  Administered 2022-08-02: 1000 mg via ORAL
  Filled 2022-08-01: qty 2

## 2022-08-01 NOTE — ED Triage Notes (Signed)
Reports going down stairs at home to let kitten in the house. Leaned against the door and lost balance. Lowered self to the floor by sliding down the wall.   Takes plavix. Denies head, or neck injury.   C/o right elbow and right hip pain. Good rom. Large swelling/ fluid collection on elbow.

## 2022-08-01 NOTE — ED Provider Notes (Signed)
[facility] DEPT Provider Note   CSN: [id number] Arrival date & time: 08/01/22  2117     History  Chief Complaint  Patient presents with   Lytle Michaels    [name] is a 88 y.o. male.  The history is provided by the patient.  Fall This is a new problem. The current episode started 3 to 5 hours ago. The problem occurs constantly. The problem has been resolved. Pertinent negatives include no chest pain, no abdominal pain, no headaches and no shortness of breath. Nothing aggravates the symptoms. Nothing relieves the symptoms. He has tried nothing for the symptoms. The treatment provided no relief.       Home Medications Prior to Admission medications   Medication Sig Start Date End Date Taking? Authorizing Provider  acetaminophen (TYLENOL) 500 MG tablet Take 1 tablet (500 mg total) by mouth every 6 (six) hours as needed for moderate pain (wrist). 02/28/17   Cheryln Manly, NP  allopurinol (ZYLOPRIM) 100 MG tablet Take 100 mg by mouth daily. 02/06/22   [provider]  aspirin EC 81 MG EC tablet Take 1 tablet (81 mg total) by mouth daily. 01/12/16   Cheryln Manly, NP  b complex vitamins tablet Take 1 tablet by mouth daily.    [provider]  Calcium Carbonate (CALCIUM 500 PO) Take 500 mg by mouth daily.    [provider]  Cholecalciferol (VITAMIN D-3) 1000 units CAPS Take 1,000 Units by mouth daily.    [provider]  clopidogrel (PLAVIX) 75 MG tablet TAKE 1 TABLET DAILY 04/10/22   Josue Hector, MD  Coenzyme Q10 (CO Q10) 100 MG CAPS Take 100 mg by mouth daily. 05/12/21   Josue Hector, MD  colchicine 0.6 MG tablet Take 0.6 mg by mouth daily. 12/16/20   [provider]  ferrous sulfate 325 (65 FE) MG tablet Take 325 mg by mouth daily with breakfast.    [provider]  folic acid (FOLVITE) 1 MG tablet Take 1 tablet (1 mg total) by mouth daily. 12/19/20   Samuella Cota, MD  Glucosamine 500 MG  CAPS Take 500 mg by mouth daily.    [provider]  isosorbide mononitrate (IMDUR) 60 MG 24 hr tablet TAKE ONE AND ONE-HALF TABLETS DAILY 05/25/22   Josue Hector, MD  levothyroxine (SYNTHROID) 25 MCG tablet Take 25 mcg by mouth daily. Patient not taking: Reported on 06/13/2022 03/23/22   [provider]  lisinopril (ZESTRIL) 20 MG tablet Take 1 tablet (20 mg total) by mouth daily. 06/13/22 06/14/23  Swinyer, Lanice Schwab, NP  loratadine (CLARITIN) 10 MG tablet Take 10 mg by mouth daily as needed for allergies.    [provider]  Multiple Vitamin (MULTIVITAMIN) capsule Take 1 capsule by mouth daily.    [provider]  nitroGLYCERIN (NITROSTAT) 0.4 MG SL tablet PLACE 1 TABLET UNDER THE TONGUE EVERY 5 MINUTES AS NEEDED FOR CHEST PAIN. NOT TO EXCEED 3 DOSES Patient taking differently: Place 0.4 mg under the tongue every 5 (five) minutes as needed for chest pain. 06/02/20   Josue Hector, MD  Omega-3 Fatty Acids (FISH OIL) 1000 MG CAPS Take 1,000 mg by mouth daily.    [provider]  ranolazine (RANEXA) 1000 MG SR tablet TAKE 1 TABLET TWICE A DAY 04/10/22   Josue Hector, MD  simvastatin (ZOCOR) 20 MG tablet TAKE 1 TABLET DAILY 05/25/22   Josue Hector, MD  thiamine 100 MG tablet  Take 1 tablet (100 mg total) by mouth daily. 12/19/20   Samuella Cota, MD  vitamin C (ASCORBIC ACID) 500 MG tablet Take 500 mg by mouth daily.    [provider]  zinc sulfate 220 (50 Zn) MG capsule Take 1 capsule (220 mg total) by mouth daily. 12/19/20   Samuella Cota, MD      Allergies    Beta adrenergic blockers    Review of Systems   Review of Systems  Respiratory:  Negative for shortness of breath.   Cardiovascular:  Negative for chest pain.  Gastrointestinal:  Negative for abdominal pain.  Neurological:  Negative for headaches.  All other systems reviewed and are negative.   Physical Exam Updated Vital Signs BP (!) 150/66 (BP Location: Left  Arm)   Pulse (!) 59   Temp 98.2 F (36.8 C) (Oral)   Resp 16   SpO2 100%  Physical Exam Vitals and nursing note reviewed.  Constitutional:      General: He is not in acute distress.    Appearance: Normal appearance. He is well-developed. He is not diaphoretic.  HENT:     Head: Normocephalic and atraumatic.     Nose: Nose normal.  Eyes:     Conjunctiva/sclera: Conjunctivae normal.     Pupils: Pupils are equal, round, and reactive to light.  Cardiovascular:     Rate and Rhythm: Normal rate and regular rhythm.     Pulses: Normal pulses.     Heart sounds: Normal heart sounds.  Pulmonary:     Effort: Pulmonary effort is normal.     Breath sounds: Normal breath sounds. No wheezing or rales.  Abdominal:     General: Bowel sounds are normal.     Palpations: Abdomen is soft.     Tenderness: There is no abdominal tenderness. There is no guarding or rebound.  Musculoskeletal:        General: Normal range of motion.     Cervical back: Normal range of motion and neck supple.  Skin:    General: Skin is warm and dry.  Neurological:     Mental Status: He is alert and oriented to person, place, and time.     ED Results / Procedures / Treatments   Labs (all labs ordered are listed, but only abnormal results are displayed) Labs Reviewed - No data to display  EKG None  Radiology CT Head Wo Contrast  Result Date: 08/01/2022 CLINICAL DATA:  Recent fall on blood thinners, initial encounter EXAM: CT HEAD WITHOUT CONTRAST TECHNIQUE: Contiguous axial images were obtained from the base of the skull through the vertex without intravenous contrast. RADIATION DOSE REDUCTION: This exam was performed according to the departmental dose-optimization program which includes automated exposure control, adjustment of the mA and/or kV according to patient size and/or use of iterative reconstruction technique. COMPARISON:  09/17/2019 FINDINGS: Brain: No evidence of acute infarction, hemorrhage,  hydrocephalus, extra-axial collection or mass lesion/mass effect. Chronic atrophic and ischemic changes are noted. Vascular: No hyperdense vessel or unexpected calcification. Skull: Normal. Negative for fracture or focal lesion. Sinuses/Orbits: No acute finding. Other: None IMPRESSION: Chronic atrophic and ischemic changes without acute abnormality. Electronically Signed   By: Inez Catalina M.D.   On: 08/01/2022 22:31   DG Elbow Complete Right  Result Date: 08/01/2022 CLINICAL DATA:  Fall EXAM: RIGHT ELBOW - COMPLETE 3+ VIEW COMPARISON:  None Available. FINDINGS: There is marked soft tissue swelling posterior to the olecranon. There is no joint effusion. There is no  acute fracture or dislocation identified. The joint spaces are well maintained. IMPRESSION: Marked soft tissue swelling posterior to the olecranon. No acute fracture or dislocation. Electronically Signed   By: Ronney Asters M.D.   On: 08/01/2022 22:23   DG Hip Unilat W or Wo Pelvis 2-3 Views Right  Result Date: 08/01/2022 CLINICAL DATA:  Recent fall with right hip pain, initial encounter EXAM: DG HIP (WITH OR WITHOUT PELVIS) 2-3V RIGHT COMPARISON:  12/17/2020 FINDINGS: Stable degenerative changes of left hip joint are noted. Pelvic ring appears intact. Proximal right femur demonstrates no acute fracture. Mild degenerative changes of the right hip joint are noted. No soft tissue abnormality is seen. Calcified aorta is noted. IMPRESSION: Bilateral hip degenerative changes left greater than right. No acute abnormality is noted. Electronically Signed   By: Inez Catalina M.D.   On: 08/01/2022 22:22    Procedures Procedures    Medications Ordered in ED Medications - No data to display  ED Course/ Medical Decision Making/ A&P                           Medical Decision Making Patient with fall attempting to let the cat out and fell  Amount and/or Complexity of Data Reviewed External Data Reviewed: notes.    Details: Previous notes  reviewed  Radiology: ordered and independent interpretation performed.    Details: Negative xrays by me   Risk OTC drugs. Risk Details: Patient is well appearing, FROM of all joints. Well appearing.  Stable for discharge.       Final Clinical Impression(s) / ED Diagnoses Final diagnoses:  None   Return for intractable cough, coughing up blood, fevers > 100.4 unrelieved by medication, shortness of breath, intractable vomiting, chest pain, shortness of breath, weakness, numbness, changes in speech, facial asymmetry, abdominal pain, passing out, Inability to tolerate liquids or food, cough, altered mental status or any concerns. No signs of systemic illness or infection. The patient is nontoxic-appearing on exam and vital signs are within normal limits.  I have reviewed the triage vital signs and the nursing notes. Pertinent labs & imaging results that were available during my care of the patient were reviewed by me and considered in my medical decision making (see chart for details). After history, exam, and medical workup I feel the patient has been appropriately medically screened and is safe for discharge home. Pertinent diagnoses were discussed with the patient. Patient was given return precautions.  Rx / DC Orders ED Discharge Orders     None         Arlita Buffkin, MD 08/01/22 2345

## 2022-08-01 NOTE — ED Provider Triage Note (Signed)
Emergency Medicine Provider Triage Evaluation Note  [name] , a 88 y.o. male  was evaluated in triage.  Pt complains of right elbow pain, right hip pain.  Patient reports that prior to arrival he fell, he does take blood thinners.  Patient complaining of right elbow pain, right shoulder pain.  The patient is an obvious hematoma to his right elbow.  No shortening or rotation of patient right hip, able to lift leg off of bed well.  Low concern for hip fracture.  Patient takes blood thinner, unsure if he hit his head.  Patient states he was on the ground for 10 to 15 minutes before being helped up.  Review of Systems  Positive:  Negative:   Physical Exam  BP (!) 150/66 (BP Location: Left Arm)   Pulse (!) 59   Resp 16   SpO2 100%  Gen:   Awake, no distress   Resp:  Normal effort  MSK:   Moves extremities without difficulty  Other:    Medical Decision Making  Medically screening exam initiated at 9:51 PM.  Appropriate orders placed.  [name] was informed that the remainder of the evaluation will be completed by another provider, this initial triage assessment does not replace that evaluation, and the importance of remaining in the ED until their evaluation is complete.     Azucena Cecil, PA-C 08/01/22 2151

## 2022-08-22 ENCOUNTER — Ambulatory Visit: Payer: Medicare Other | Admitting: Orthopaedic Surgery

## 2022-08-29 ENCOUNTER — Other Ambulatory Visit: Payer: Self-pay

## 2022-09-01 ENCOUNTER — Emergency Department (HOSPITAL_COMMUNITY)
Admission: EM | Admit: 2022-09-01 | Discharge: 2022-09-01 | Disposition: A | Discharge status: Home / Self Care | Payer: Medicare Other | Attending: Emergency Medicine | Admitting: Emergency Medicine

## 2022-09-01 ENCOUNTER — Encounter (HOSPITAL_COMMUNITY): Payer: Self-pay | Admitting: Emergency Medicine

## 2022-09-01 ENCOUNTER — Other Ambulatory Visit: Payer: Self-pay

## 2022-09-01 DIAGNOSIS — Z79899 Other long term (current) drug therapy: Secondary | ICD-10-CM | POA: Insufficient documentation

## 2022-09-01 DIAGNOSIS — I2510 Atherosclerotic heart disease of native coronary artery without angina pectoris: Secondary | ICD-10-CM | POA: Diagnosis not present

## 2022-09-01 DIAGNOSIS — Z7982 Long term (current) use of aspirin: Secondary | ICD-10-CM | POA: Diagnosis not present

## 2022-09-01 DIAGNOSIS — R040 Epistaxis: Secondary | ICD-10-CM | POA: Diagnosis not present

## 2022-09-01 DIAGNOSIS — I10 Essential (primary) hypertension: Secondary | ICD-10-CM | POA: Diagnosis not present

## 2022-09-01 MED ORDER — OXYMETAZOLINE HCL 0.05 % NA SOLN
1.0000 | Freq: Once | NASAL | Status: AC
  Administered 2022-09-01: 1 via NASAL
  Filled 2022-09-01: qty 30

## 2022-09-01 MED ORDER — SILVER NITRATE-POT NITRATE 75-25 % EX MISC
1.0000 | Freq: Once | CUTANEOUS | Status: AC
  Administered 2022-09-01: 1 via TOPICAL
  Filled 2022-09-01: qty 10

## 2022-09-01 NOTE — ED Triage Notes (Addendum)
Patient presents due to epistasis and is on blood thinners. He has been bleeding for 45 mins. He had an episode of the same a few days ago.

## 2022-09-01 NOTE — ED Provider Triage Note (Signed)
Emergency Medicine Provider Triage Evaluation Note  WESTIN KNOTTS , a 89 y.o. male  was evaluated in triage.  Pt complains of nosebleed onset this morning while lying in bed, on Plavix but hasn't taken in 2 days. No injury.  Review of Systems  Positive: As above Negative: As above  Physical Exam  BP (!) 167/84   Pulse (!) 59   Temp 97.7 F (36.5 C) (Oral)   Resp 18   SpO2 100%  Gen:   Awake, no distress   Resp:  Normal effort  MSK:   Moves extremities without difficulty  Other:    Bleeding from left nostril, large clot removed from nose as well as pt coughed up a large clot. Afrin applied, pressure held for 10 minutes, continues to bleed, slight blood down post oropharynx  HR variable during pressure from 40s-120s, no hx a fib  Medical Decision Making  Medically screening exam initiated at 11:21 AM.  Appropriate orders placed.  Serita Sheller was informed that the remainder of the evaluation will be completed by another provider, this initial triage assessment does not replace that evaluation, and the importance of remaining in the ED until their evaluation is complete.     Tacy Learn, PA-C 09/01/22 1139

## 2022-09-01 NOTE — ED Provider Notes (Signed)
[facility] DEPT Provider Note   CSN: [id number] Arrival date & time: 09/01/22  1108     History  Chief Complaint  Patient presents with   Epistaxis    [name] is a 89 y.o. male.  Patient is an 89 year old male with a history of stroke, GERD, hypertension, hyperlipidemia, CAD on Plavix presenting today with complaint of a nosebleed.  2 days ago he had a nosebleed from the left nostril which improved with pressure at home but it restarted about an hour and a half ago and he cannot get the bleeding to stop.  He denies any chest pain, shortness of breath, dizziness or syncope.  No prior surgical interventions to his nose.  He also reports that he has not taken his blood pressure medication in the last few days because he has forgotten it.  The history is provided by the patient.  Epistaxis      Home Medications Prior to Admission medications   Medication Sig Start Date End Date Taking? Authorizing Provider  acetaminophen (TYLENOL) 500 MG tablet Take 1 tablet (500 mg total) by mouth every 6 (six) hours as needed for moderate pain (wrist). 02/28/17   Cheryln Manly, NP  allopurinol (ZYLOPRIM) 100 MG tablet Take 100 mg by mouth daily. 02/06/22   [provider]  aspirin EC 81 MG EC tablet Take 1 tablet (81 mg total) by mouth daily. 01/12/16   Cheryln Manly, NP  b complex vitamins tablet Take 1 tablet by mouth daily.    [provider]  Calcium Carbonate (CALCIUM 500 PO) Take 500 mg by mouth daily.    [provider]  Cholecalciferol (VITAMIN D-3) 1000 units CAPS Take 1,000 Units by mouth daily.    [provider]  clopidogrel (PLAVIX) 75 MG tablet TAKE 1 TABLET DAILY 04/10/22   Josue Hector, MD  Coenzyme Q10 (CO Q10) 100 MG CAPS Take 100 mg by mouth daily. 05/12/21   Josue Hector, MD  colchicine 0.6 MG tablet Take 0.6 mg by mouth daily. 12/16/20   [provider]  ferrous sulfate 325 (65 FE) MG  tablet Take 325 mg by mouth daily with breakfast.    [provider]  folic acid (FOLVITE) 1 MG tablet Take 1 tablet (1 mg total) by mouth daily. 12/19/20   Samuella Cota, MD  Glucosamine 500 MG CAPS Take 500 mg by mouth daily.    [provider]  isosorbide mononitrate (IMDUR) 60 MG 24 hr tablet TAKE ONE AND ONE-HALF TABLETS DAILY 05/25/22   Josue Hector, MD  levothyroxine (SYNTHROID) 25 MCG tablet Take 25 mcg by mouth daily. Patient not taking: Reported on 06/13/2022 03/23/22   [provider]  lisinopril (ZESTRIL) 20 MG tablet Take 1 tablet (20 mg total) by mouth daily. 06/13/22 06/14/23  Swinyer, Lanice Schwab, NP  loratadine (CLARITIN) 10 MG tablet Take 10 mg by mouth daily as needed for allergies.    [provider]  Multiple Vitamin (MULTIVITAMIN) capsule Take 1 capsule by mouth daily.    [provider]  nitroGLYCERIN (NITROSTAT) 0.4 MG SL tablet PLACE 1 TABLET UNDER THE TONGUE EVERY 5 MINUTES AS NEEDED FOR CHEST PAIN. NOT TO EXCEED 3 DOSES Patient taking differently: Place 0.4 mg under the tongue every 5 (five) minutes as needed for chest pain. 06/02/20   Josue Hector, MD  Omega-3 Fatty Acids (FISH OIL) 1000 MG CAPS Take 1,000 mg by mouth daily.    [provider]  ranolazine (RANEXA) 1000 MG SR tablet TAKE 1 TABLET TWICE A DAY 04/10/22   Josue Hector, MD  simvastatin (ZOCOR) 20 MG tablet TAKE 1 TABLET DAILY 05/25/22   Josue Hector, MD  thiamine 100 MG tablet Take 1 tablet (100 mg total) by mouth daily. 12/19/20   Samuella Cota, MD  vitamin C (ASCORBIC ACID) 500 MG tablet Take 500 mg by mouth daily.    [provider]  zinc sulfate 220 (50 Zn) MG capsule Take 1 capsule (220 mg total) by mouth daily. 12/19/20   Samuella Cota, MD      Allergies    Beta adrenergic blockers    Review of Systems   Review of Systems  HENT:  Positive for nosebleeds.     Physical Exam Updated Vital Signs BP (!) 186/77   Pulse  (!) 49   Temp 97.7 F (36.5 C) (Oral)   Resp 20   SpO2 99%  Physical Exam Vitals and nursing note reviewed.  Constitutional:      General: He is not in acute distress.    Appearance: He is well-developed.  HENT:     Head: Normocephalic and atraumatic.     Nose:     Left Nostril: Epistaxis present.     Comments: After clot was removed from the left nare a small bleeding vessel was identified    Mouth/Throat:     Comments: Mall amount of blood present in the posterior pharynx Eyes:     Conjunctiva/sclera: Conjunctivae normal.     Pupils: Pupils are equal, round, and reactive to light.  Cardiovascular:     Rate and Rhythm: Normal rate and regular rhythm.     Heart sounds: No murmur heard. Pulmonary:     Effort: Pulmonary effort is normal. No respiratory distress.     Breath sounds: Normal breath sounds. No wheezing or rales.  Abdominal:     General: There is no distension.     Palpations: Abdomen is soft.     Tenderness: There is no abdominal tenderness. There is no guarding or rebound.  Musculoskeletal:        General: No tenderness. Normal range of motion.     Cervical back: Normal range of motion and neck supple.  Skin:    General: Skin is warm and dry.     Findings: No erythema or rash.  Neurological:     Mental Status: He is alert and oriented to person, place, and time. Mental status is at baseline.  Psychiatric:        Behavior: Behavior normal.     ED Results / Procedures / Treatments   Labs (all labs ordered are listed, but only abnormal results are displayed) Labs Reviewed - No data to display  EKG None  Radiology No results found.  Procedures .Epistaxis Management  Date/Time: 09/01/2022 12:48 PM  Performed by: Blanchie Dessert, MD Authorized by: Blanchie Dessert, MD   Consent:    Consent obtained:  Verbal   Consent given by:  Patient   Risks discussed:  Bleeding   Alternatives discussed:  No treatment Universal protocol:    Procedure  explained and questions answered to patient or proxy's satisfaction: yes     Patient identity confirmed:  Verbally with patient Anesthesia:    Anesthesia method:  None Procedure details:    Treatment site:  L anterior   Treatment method:  Silver nitrate   Treatment complexity:  Limited   Treatment episode: initial  Post-procedure details:    Assessment:  Bleeding stopped   Procedure completion:  Tolerated     Medications Ordered in ED Medications  oxymetazoline (AFRIN) 0.05 % nasal spray 1 spray (1 spray Each Nare Given 09/01/22 1140)  silver nitrate applicators applicator 1 Stick (1 Stick Topical Given by Other 09/01/22 1203)    ED Course/ Medical Decision Making/ A&P                             Medical Decision Making Risk OTC drugs. Prescription drug management.   Patient here for a left anterior epistaxis.  He is on Plavix and is hypertensive today because he has not taken his blood pressure medicine in several days.  He reports he is just forgot.  He is not having any chest pain or shortness of breath.  Bleeding was identified in the left nare and the area was cauterized.  30 minutes of evaluation patient has not had any further bleeding.  At this time will discharge home with Afrin, and return for recurrent symptoms.  He will take his blood pressure medication at home when he goes home.        Final Clinical Impression(s) / ED Diagnoses Final diagnoses:  Left-sided epistaxis    Rx / DC Orders ED Discharge Orders     None         Blanchie Dessert, MD 09/01/22 1251

## 2022-09-01 NOTE — Discharge Instructions (Addendum)
Use the Afrin a squirt in the left side 2 times a day for the next 2 days and then stop.  If the bleeding starts again stick your finger in the your nose and push on the inside.  If still will not stop return here to get packing.  Take your blood pressure medication when you get home.

## 2022-09-10 ENCOUNTER — Other Ambulatory Visit: Payer: Self-pay | Admitting: Physician Assistant

## 2022-09-10 DIAGNOSIS — Z01818 Encounter for other preprocedural examination: Secondary | ICD-10-CM

## 2022-09-17 NOTE — Pre-Procedure Instructions (Signed)
Surgical Instructions    Your procedure is scheduled on Tuesday, September 25, 2022 at 11:40 AM.  Report to [facility] Main Entrance "A" at 9:40 A.M., then check in with the Admitting office.  Call this number if you have problems the morning of surgery:  [phone]   If you have any questions prior to your surgery date call [phone]: Open Monday-Friday 8am-4pm  *If you experience any cold or flu symptoms such as cough, fever, chills, shortness of breath, etc. between now and your scheduled surgery, please notify us.*    Remember:  Do not eat after midnight the night before your surgery  You may drink clear liquids until 8:40 AM the morning of your surgery.   Clear liquids allowed are: Water, Non-Citrus Juices (without pulp), Carbonated Beverages, Clear Tea, Black Coffee Only (NO MILK, CREAM OR POWDERED CREAMER of any kind), and Gatorade.  Patient Instructions  The night before surgery:  No food after midnight. ONLY clear liquids after midnight  The day of surgery (if you do NOT have diabetes):  Drink ONE (1) Pre-Surgery Clear Ensure by 8:40 AM the morning of surgery. Drink in one sitting. Do not sip.  This drink was given to you during your hospital  pre-op appointment visit.  Nothing else to drink after completing the  Pre-Surgery Clear Ensure.          If you have questions, please contact your surgeon's office.     Take these medicines the morning of surgery with A SIP OF WATER:  allopurinol (ZYLOPRIM)  colchicine  isosorbide mononitrate (IMDUR)  ranolazine (RANEXA)  simvastatin (ZOCOR)  levothyroxine (SYNTHROID)   IF NEEDED: acetaminophen (TYLENOL)  nitroGLYCERIN (NITROSTAT)   Follow your surgeon's instructions on when to stop Aspirin.  If no instructions were given by your surgeon then you will need to call the office to get those instructions.    *Per your surgeon's instructions, STOP taking clopidogrel (PLAVIX) 5 days prior to surgery. Your last dose  should be on 09/19/22.*   As of today, STOP taking any Aspirin (unless otherwise instructed by your surgeon) Aleve, Naproxen, Ibuprofen, Motrin, Advil, Goody's, BC's, all herbal medications, fish oil, and all vitamins.            Do NOT Smoke (Tobacco/Vaping) for 24 hours prior to your procedure.  If you use a CPAP at night, you may bring your mask/headgear for your overnight stay.   Contacts, glasses, piercing's, hearing aid's, dentures or partials may not be worn into surgery, please bring cases for these belongings.    For patients admitted to the hospital, discharge time will be determined by your treatment team.   Patients discharged the day of surgery will not be allowed to drive home, and someone needs to stay with them for 24 hours.  SURGICAL WAITING ROOM VISITATION Patients having surgery or a procedure may have two support people in the waiting area. Visitors may stay in the waiting area during the procedure and switch out with other visitors if needed. Only 1 support person is allowed in the pre-op area with the patient AFTER the patient is prepped. This person cannot be switched out. Children under the age of 20 must have an adult accompany them who is not the patient. If the patient needs to stay at the hospital during part of their recovery, the visitor guidelines for inpatient rooms apply.  Please refer to the [facility] website for the visitor guidelines for Inpatients (after your surgery is over and you are  in a regular room).    Special instructions:   [facility]- Preparing For Surgery  Before surgery, you can play an important role. Because skin is not sterile, your skin needs to be as free of germs as possible. You can reduce the number of germs on your skin by washing with CHG (chlorahexidine gluconate) Soap before surgery.  CHG is an antiseptic cleaner which kills germs and bonds with the skin to continue killing germs even after washing.    Oral Hygiene is also  important to reduce your risk of infection.  Remember - BRUSH YOUR TEETH THE MORNING OF SURGERY WITH YOUR REGULAR TOOTHPASTE  Please do not use if you have an allergy to CHG or antibacterial soaps. If your skin becomes reddened/irritated stop using the CHG.  Do not shave (including legs and underarms) for at least 48 hours prior to first CHG shower. It is OK to shave your face.  Please follow these instructions carefully.   Shower the NIGHT BEFORE SURGERY and the MORNING OF SURGERY  If you chose to wash your hair, wash your hair first as usual with your normal shampoo.  After you shampoo, rinse your hair and body thoroughly to remove the shampoo.  Use CHG Soap as you would any other liquid soap. You can apply CHG directly to the skin and wash gently with a scrungie or a clean washcloth.   Apply the CHG Soap to your body ONLY FROM THE NECK DOWN.  Do not use on open wounds or open sores. Avoid contact with your eyes, ears, mouth and genitals (private parts). Wash Face and genitals (private parts)  with your normal soap.   Wash thoroughly, paying special attention to the area where your surgery will be performed.  Thoroughly rinse your body with warm water from the neck down.  DO NOT shower/wash with your normal soap after using and rinsing off the CHG Soap.  Pat yourself dry with a CLEAN TOWEL.  Wear CLEAN PAJAMAS to bed the night before surgery  Place CLEAN SHEETS on your bed the night before your surgery  DO NOT SLEEP WITH PETS.   Day of Surgery: Take a shower with CHG soap. Do not wear jewelry or makeup Do not wear lotions, powders, perfumes/colognes, or deodorant. Do not shave 48 hours prior to surgery.  Men may shave face and neck. Do not wear nail polish, gel polish, artificial nails, or any other type of covering on natural nails (fingers and toes) If you have artificial nails or gel coating that need to be removed by a nail salon, please have this removed prior to surgery.  Artificial nails or gel coating may interfere with anesthesia's ability to adequately monitor your vital signs. Wear Clean/Comfortable clothing the morning of surgery Do not bring valuables to the hospital.  [facility] is not responsible for any belongings or valuables. Remember to brush your teeth WITH YOUR REGULAR TOOTHPASTE.   Please read over the following fact sheets that you were given.  If you received a COVID test during your pre-op visit  it is requested that you wear a mask when out in public, stay away from anyone that may not be feeling well and notify your surgeon if you develop symptoms. If you have been in contact with anyone that has tested positive in the last 10 days please notify you surgeon.

## 2022-09-18 ENCOUNTER — Telehealth: Payer: Self-pay | Admitting: Orthopaedic Surgery

## 2022-09-18 ENCOUNTER — Encounter (HOSPITAL_COMMUNITY)
Admission: RE | Admit: 2022-09-18 | Discharge: 2022-09-18 | Disposition: A | Discharge status: Home / Self Care | Payer: Medicare Other | Source: Ambulatory Visit | Attending: Orthopaedic Surgery | Admitting: Orthopaedic Surgery

## 2022-09-18 ENCOUNTER — Other Ambulatory Visit: Payer: Self-pay

## 2022-09-18 ENCOUNTER — Encounter (HOSPITAL_COMMUNITY): Payer: Self-pay

## 2022-09-18 VITALS — BP 171/62 | HR 83 | Temp 97.7°F | Resp 18 | Ht 66.0 in | Wt 148.3 lb

## 2022-09-18 DIAGNOSIS — Z01812 Encounter for preprocedural laboratory examination: Secondary | ICD-10-CM | POA: Diagnosis present

## 2022-09-18 DIAGNOSIS — Z01818 Encounter for other preprocedural examination: Secondary | ICD-10-CM

## 2022-09-18 HISTORY — DX: Unspecified osteoarthritis, unspecified site: M19.90

## 2022-09-18 HISTORY — DX: Hypothyroidism, unspecified: E03.9

## 2022-09-18 HISTORY — DX: Acute myocardial infarction, unspecified: I21.9

## 2022-09-18 LAB — CBC
HCT: 31.4 % — ABNORMAL LOW (ref 39.0–52.0)
Hemoglobin: 10 g/dL — ABNORMAL LOW (ref 13.0–17.0)
MCH: 32.7 pg (ref 26.0–34.0)
MCHC: 31.8 g/dL (ref 30.0–36.0)
MCV: 102.6 fL — ABNORMAL HIGH (ref 80.0–100.0)
Platelets: 201 10*3/uL (ref 150–400)
RBC: 3.06 MIL/uL — ABNORMAL LOW (ref 4.22–5.81)
RDW: 16.3 % — ABNORMAL HIGH (ref 11.5–15.5)
WBC: 7.2 10*3/uL (ref 4.0–10.5)
nRBC: 0 % (ref 0.0–0.2)

## 2022-09-18 LAB — TYPE AND SCREEN
ABO/RH(D): A NEG
Antibody Screen: NEGATIVE

## 2022-09-18 LAB — COMPREHENSIVE METABOLIC PANEL
ALT: 18 U/L (ref 0–44)
AST: 31 U/L (ref 15–41)
Albumin: 3.6 g/dL (ref 3.5–5.0)
Alkaline Phosphatase: 81 U/L (ref 38–126)
Anion gap: 12 (ref 5–15)
BUN: 26 mg/dL — ABNORMAL HIGH (ref 8–23)
CO2: 20 mmol/L — ABNORMAL LOW (ref 22–32)
Calcium: 9.1 mg/dL (ref 8.9–10.3)
Chloride: 106 mmol/L (ref 98–111)
Creatinine, Ser: 1.17 mg/dL (ref 0.61–1.24)
GFR, Estimated: 58 mL/min — ABNORMAL LOW (ref >60)
Glucose, Bld: 118 mg/dL — ABNORMAL HIGH (ref 70–99)
Potassium: 4.1 mmol/L (ref 3.5–5.1)
Sodium: 138 mmol/L (ref 135–145)
Total Bilirubin: 1 mg/dL (ref 0.3–1.2)
Total Protein: 6.3 g/dL — ABNORMAL LOW (ref 6.5–8.1)

## 2022-09-18 LAB — SURGICAL PCR SCREEN
MRSA, PCR: NEGATIVE
Staphylococcus aureus: NEGATIVE

## 2022-09-18 NOTE — Telephone Encounter (Signed)
[name] patient son would like to know if patient can travel/fly on 09/28/22 to [place], [place] being that he will have surgery on 09/25/22

## 2022-09-18 NOTE — Progress Notes (Signed)
PCP - Dr. Melissa Montane Cardiologist - Dr. Jenkins Rouge  PPM/ICD - Denies  Chest x-ray - N/A EKG - 06/13/22 Stress Test - 06/18/22 ECHO - 01/12/16 Cardiac Cath - 02/27/17  Sleep Study - Denies  Diabetes: Denies  Blood Thinner Instructions: Stop Eliquis 5 days prior to surgery. LD should be on 09/19/22. Aspirin Instructions: Follow surgeon's instructions.  ERAS Protcol - Yes, PRE-SURGERY Ensure  COVID TEST- N/A   Anesthesia review: Yes, cardiac hx; clearance on 06/13/22 in Epic  Patient denies shortness of breath, fever, cough and chest pain at PAT appointment   All instructions explained to the patient, with a verbal understanding of the material. Patient agrees to go over the instructions while at home for a better understanding. Patient also instructed to self quarantine after being tested for COVID-19. The opportunity to ask questions was provided.

## 2022-09-18 NOTE — Telephone Encounter (Signed)
LMOM for son of the below message from [facility]

## 2022-09-19 NOTE — Progress Notes (Signed)
Anesthesia Chart Review:  89 year old male with history of  CAD s/p DES to RCA July (12/6312) complicated by radial AV fistual, fibrillation, carotid artery disease s/p L CEA with known total occlusion of the RICA, HTN, HLD, and CVA.  Last seen by cardiology APP Christen Bame, NP on 06/13/2022 for preop evaluation.  Per note, "Preoperative cardiac evaluation: Agreement that he may proceed with surgery at March 2023 office visit with Dr. Johnsie Cancel, pending normal nuclear stress test. His RCRI for MACE is 11%.  He is unable to achieve > 4 METS activity. We will get lexiscan myoview for evaluation of ischemia. Due to length of stenting in RCA, I will verify with Dr. Johnsie Cancel that he may hold Plavix for 5 days prior to surgery per request of Dr. Ninfa Linden. ADDENDUM: Nuclear stress test 06/18/2022 revealed no evidence of ischemia.  He may proceed with surgery without further testing. Per Dr. Johnsie Cancel, he may hold Plavix for 5 days prior to surgery. "  Preop labs reviewed, mild anemia with hemoglobin 10.0, otherwise unremarkable.  EKG 06/13/2022: Coarse A-fib with PVCs versus aberrantly conducted beat.  Regular response.  Rate 54.  Nuclear stress 06/18/2022:   Findings are consistent with prior myocardial infarction. 2 fixed moderate defects noted consistent with LAD and RCA infarction. No ischemia. The study is low risk.   LV perfusion is abnormal. There is no evidence of ischemia. There is evidence of infarction. Defect 1: There is a small defect with moderate reduction in uptake present in the mid to basal anteroseptal location(s) that is fixed. There is normal wall motion in the defect area. Consistent with infarction. Defect 2: There is a small defect with moderate reduction in uptake present in the apical to mid inferior location(s). There is normal wall motion in the defect area. Consistent with infarction.   Left ventricular function is normal. Nuclear stress EF: 66 %. The left ventricular ejection fraction  is hyperdynamic (>65%). End diastolic cavity size is mildly enlarged.  Carotid duplex 05/17/2022: Summary:  Right Carotid: Evidence consistent with a total occlusion of the right ICA. The ECA appears >50% stenosed.  Left Carotid: Velocities in the left ICA are consistent with a 1-39% stenosis.  Vertebrals: Bilateral vertebral arteries demonstrate antegrade flow.  Subclavians: Normal flow hemodynamics were seen in bilateral subclavian arteries.    [name] [facility] Short Stay Center/Anesthesiology Phone [phone] 09/19/2022 3:38 PM

## 2022-09-19 NOTE — Anesthesia Preprocedure Evaluation (Addendum)
Anesthesia Evaluation  Patient identified by MRN, date of birth, ID band Patient awake    Reviewed: Allergy & Precautions, NPO status , Patient's Chart, lab work & pertinent test results  History of Anesthesia Complications Negative for: history of anesthetic complications  Airway Mallampati: III  TM Distance: >3 FB Neck ROM: Full    Dental  (+) Chipped, Dental Advisory Given,    Pulmonary neg pulmonary ROS   Pulmonary exam normal breath sounds clear to auscultation       Cardiovascular hypertension, + CAD, + Past MI and + Cardiac Stents (2017)  Normal cardiovascular exam+ dysrhythmias Atrial Fibrillation  Rhythm:Regular Rate:Normal  TTE 2017: moderate LVH, EF 60-65%, grade 1 DD, mild AR, moderate LAE   S/P left CEA  Stress Test 2023 LV perfusion is abnormal. There is no evidence of ischemia. There is evidence of infarction. Defect 1: There is a small defect with moderate reduction in uptake present in the mid to basal anteroseptal location(s) that is fixed. There is normal wall motion in the defect area. Consistent with infarction. The defect is consistent with abnormal perfusion in the LAD territory. Defect 2: There is a small defect with moderate reduction in uptake present in the apical to mid inferior location(s). There is normal wall motion in the defect area. Consistent with infarction. The defect is consistent with abnormal perfusion in the RCA territory   Neuro/Psych CVA, No Residual Symptoms    GI/Hepatic Neg liver ROS,GERD  ,,  Endo/Other  Hypothyroidism    Renal/GU Renal InsufficiencyRenal disease     Musculoskeletal  (+) Arthritis ,    Abdominal   Peds  Hematology  (+) Blood dyscrasia (Hgb 12.0), anemia   Anesthesia Other Findings On plavix, last dose 11/05/22  Reproductive/Obstetrics                             Anesthesia Physical Anesthesia Plan  ASA: 3  Anesthesia Plan:  Spinal   Post-op Pain Management: Tylenol PO (pre-op)*   Induction:   PONV Risk Score and Plan: 1 and Treatment may vary due to age or medical condition, Propofol infusion, Ondansetron and Dexamethasone  Airway Management Planned: Natural Airway and Simple Face Mask  Additional Equipment: None  Intra-op Plan:   Post-operative Plan:   Informed Consent: I have reviewed the patients History and Physical, chart, labs and discussed the procedure including the risks, benefits and alternatives for the proposed anesthesia with the patient or authorized representative who has indicated his/her understanding and acceptance.     Dental advisory given  Plan Discussed with: CRNA  Anesthesia Plan Comments: (PAT note by Karoline Caldwell, PA-C: 89 year old male with history of CAD s/p DES to RCA [id number]) complicated by radial AV fistual, fibrillation, carotid artery disease s/p L CEA with known total occlusion of the RICA, HTN, HLD, andCVA.Last seen by cardiology APP Christen Bame, NP on 06/13/2022 for preop evaluation.  Per note, "Preoperative cardiac evaluation:Agreement that he may proceed with surgery at March 2023 office visit with Dr. Johnsie Cancel, pendingnormal nuclear stress test.His RCRI for MACE is 11%.He is unable to achieve>4 METS activity. We will get lexiscan myoview for evaluation of ischemia.Due to length of stenting in RCA, I will verify with Dr. Johnsie Cancel that he may hold Plavix for 5 days prior to surgery per request of Dr. Ninfa Linden.ADDENDUM:Nuclear stress test 06/18/2022 revealed no evidence of ischemia. He may proceed with surgery without further testing. Per Dr. Johnsie Cancel, he may hold Plavix for  5 days prior to surgery."  Preop labs reviewed, mild anemia with hemoglobin 10.0, otherwise unremarkable.  EKG 06/13/2022: Coarse A-fib with PVCs versus aberrantly conducted beat.  Regular response.  Rate 54.  Nuclear stress 06/18/2022:  Findings are consistent with prior  myocardial infarction. 2 fixed moderate defects noted consistent with LAD and RCA infarction. No ischemia. The study is low risk.  LV perfusion is abnormal. There is no evidence of ischemia. There is evidence of infarction. Defect 1: There is a small defect with moderate reduction in uptake present in the mid to basal anteroseptal location(s) that is fixed. There is normal wall motion in the defect area. Consistent with infarction. Defect 2: There is a small defect with moderate reduction in uptake present in the apical to mid inferior location(s). There is normal wall motion in the defect area. Consistent with infarction.  Left ventricular function is normal. Nuclear stress EF: 66 %. The left ventricular ejection fraction is hyperdynamic (>65%). End diastolic cavity size is mildly enlarged.  Carotid duplex 05/17/2022: Summary:  Right Carotid: Evidence consistent with a total occlusion of the right ICA. The ECA appears >50% stenosed.  Left Carotid: Velocities in the left ICA are consistent with a 1-39% stenosis.  Vertebrals:Bilateral vertebral arteries demonstrate antegrade flow.  Subclavians: Normal flow hemodynamics were seen in bilateral subclavian arteries.    )        Anesthesia Quick Evaluation

## 2022-10-08 ENCOUNTER — Encounter: Payer: Medicare Other | Admitting: Orthopaedic Surgery

## 2022-10-12 ENCOUNTER — Encounter (INDEPENDENT_AMBULATORY_CARE_PROVIDER_SITE_OTHER): Payer: Medicare Other | Admitting: Ophthalmology

## 2022-10-12 DIAGNOSIS — I10 Essential (primary) hypertension: Secondary | ICD-10-CM

## 2022-10-12 DIAGNOSIS — H35033 Hypertensive retinopathy, bilateral: Secondary | ICD-10-CM

## 2022-10-12 DIAGNOSIS — H353132 Nonexudative age-related macular degeneration, bilateral, intermediate dry stage: Secondary | ICD-10-CM

## 2022-10-12 DIAGNOSIS — H43813 Vitreous degeneration, bilateral: Secondary | ICD-10-CM

## 2022-10-12 DIAGNOSIS — D3132 Benign neoplasm of left choroid: Secondary | ICD-10-CM | POA: Diagnosis not present

## 2022-10-24 ENCOUNTER — Telehealth: Payer: Self-pay | Admitting: Orthopaedic Surgery

## 2022-10-24 NOTE — Telephone Encounter (Signed)
Patient's son asking to get clarification on surgery details. Please call --[phone] Amsler)

## 2022-10-25 ENCOUNTER — Other Ambulatory Visit: Payer: Self-pay | Admitting: Physician Assistant

## 2022-10-25 DIAGNOSIS — Z01818 Encounter for other preprocedural examination: Secondary | ICD-10-CM

## 2022-10-25 NOTE — Telephone Encounter (Signed)
I called son yesterday to confirm surgery.

## 2022-11-02 NOTE — Progress Notes (Signed)
Surgical Instructions    Your procedure is scheduled on Tuesday, November 13, 2022.  Report to [facility] Main Entrance "A" at noon then check in with the Admitting office.  Call this number if you have problems the morning of surgery:  [phone]   If you have any questions prior to your surgery date call [phone]: Open Monday-Friday 8am-4pm If you experience any cold or flu symptoms such as cough, fever, chills, shortness of breath, etc. between now and your scheduled surgery, please notify us at the above number     Remember:  Do not eat after midnight the night before your surgery  You may drink clear liquids until 11:00 the morning of your surgery.   Clear liquids allowed are: Water, Non-Citrus Juices (without pulp), Carbonated Beverages, Clear Tea, Black Coffee ONLY (NO MILK, CREAM OR POWDERED CREAMER of any kind), and Gatorade  Patient Instructions  The night before surgery:  No food after midnight. ONLY clear liquids after midnight  The day of surgery (if you do NOT have diabetes):  Drink ONE (1) Pre-Surgery Clear Ensure by 11:00 the morning of surgery. Drink in one sitting. Do not sip.  This drink was given to you during your hospital  pre-op appointment visit.  Nothing else to drink after completing the  Pre-Surgery Clear Ensure.          If you have questions, please contact your surgeon's office.      Take these medicines the morning of surgery with A SIP OF WATER:  acetaminophen (TYLENOL)  allopurinol (ZYLOPRIM)  isosorbide mononitrate (IMDUR  levothyroxine (SYNTHROID)  ranolazine (RANEXA)  simvastatin (ZOCOR)   Follow your surgeon's instructions on when to stop Aspirin and clopidogrel (PLAVIX)  .  If no instructions were given by your surgeon then you will need to call the office to get those instructions.     As of today, STOP taking any Aleve, Naproxen, Ibuprofen, Motrin, Advil, Goody's, BC's, all herbal medications, fish oil, and all vitamins.            Do not wear jewelry  Do not wear lotions, powders, cologne or deodorant.  Men may shave face and neck. Do not bring valuables to the hospital.  [facility] is not responsible for any belongings or valuables.    Do NOT Smoke (Tobacco/Vaping)  24 hours prior to your procedure  If you use a CPAP at night, you may bring your mask for your overnight stay.   Contacts, glasses, hearing aids, dentures or partials may not be worn into surgery, please bring cases for these belongings   For patients admitted to the hospital, discharge time will be determined by your treatment team.   Patients discharged the day of surgery will not be allowed to drive home, and someone needs to stay with them for 24 hours.   SURGICAL WAITING ROOM VISITATION Patients having surgery or a procedure may have no more than 2 support people in the waiting area - these visitors may rotate.   Children under the age of 60 must have an adult with them who is not the patient. If the patient needs to stay at the hospital during part of their recovery, the visitor guidelines for inpatient rooms apply. Pre-op nurse will coordinate an appropriate time for 1 support person to accompany patient in pre-op.  This support person may not rotate.   Please refer to [email or web address] for the visitor guidelines for Inpatients (after your surgery is over and you are in a regular room).  Special instructions:    Oral Hygiene is also important to reduce your risk of infection.  Remember - BRUSH YOUR TEETH THE MORNING OF SURGERY WITH YOUR REGULAR TOOTHPASTE   [facility]- Preparing For Surgery  Before surgery, you can play an important role. Because skin is not sterile, your skin needs to be as free of germs as possible. You can reduce the number of germs on your skin by washing with CHG (chlorahexidine gluconate) Soap before surgery.  CHG is an antiseptic cleaner which kills germs  and bonds with the skin to continue killing germs even after washing.     Please do not use if you have an allergy to CHG or antibacterial soaps. If your skin becomes reddened/irritated stop using the CHG.  Do not shave (including legs and underarms) for at least 48 hours prior to first CHG shower. It is OK to shave your face.  Please follow these instructions carefully.     Shower the NIGHT BEFORE SURGERY and the MORNING OF SURGERY with CHG Soap.   If you chose to wash your hair, wash your hair first as usual with your normal shampoo. After you shampoo, rinse your hair and body thoroughly to remove the shampoo.  Then [employer] and genitals (private parts) with your normal soap and rinse thoroughly to remove soap.  After that Use CHG Soap as you would any other liquid soap. You can apply CHG directly to the skin and wash gently with a scrungie or a clean washcloth.   Apply the CHG Soap to your body ONLY FROM THE NECK DOWN.  Do not use on open wounds or open sores. Avoid contact with your eyes, ears, mouth and genitals (private parts). Wash Face and genitals (private parts)  with your normal soap.   Wash thoroughly, paying special attention to the area where your surgery will be performed.  Thoroughly rinse your body with warm water from the neck down.  DO NOT shower/wash with your normal soap after using and rinsing off the CHG Soap.  Pat yourself dry with a CLEAN TOWEL.  Wear CLEAN PAJAMAS to bed the night before surgery  Place CLEAN SHEETS on your bed the night before your surgery  DO NOT SLEEP WITH PETS.   Day of Surgery:  Take a shower with CHG soap. Wear Clean/Comfortable clothing the morning of surgery Do not apply any deodorants/lotions.   Remember to brush your teeth WITH YOUR REGULAR TOOTHPASTE.    If you received a COVID test during your pre-op visit, it is requested that you wear a mask when out in public, stay away from anyone that may not be feeling well, and  notify your surgeon if you develop symptoms. If you have been in contact with anyone that has tested positive in the last 10 days, please notify your surgeon.    Please read over the following fact sheets that you were given.

## 2022-11-05 ENCOUNTER — Encounter (HOSPITAL_COMMUNITY): Payer: Self-pay

## 2022-11-05 ENCOUNTER — Other Ambulatory Visit: Payer: Self-pay

## 2022-11-05 ENCOUNTER — Encounter (HOSPITAL_COMMUNITY)
Admission: RE | Admit: 2022-11-05 | Discharge: 2022-11-05 | Disposition: A | Discharge status: Home / Self Care | Payer: Medicare Other | Source: Ambulatory Visit | Attending: Orthopaedic Surgery | Admitting: Orthopaedic Surgery

## 2022-11-05 VITALS — BP 167/74 | HR 54 | Temp 97.5°F | Resp 17 | Ht 68.0 in | Wt 143.6 lb

## 2022-11-05 DIAGNOSIS — Z01818 Encounter for other preprocedural examination: Secondary | ICD-10-CM | POA: Diagnosis present

## 2022-11-05 LAB — CBC
HCT: 37.5 % — ABNORMAL LOW (ref 39.0–52.0)
Hemoglobin: 12 g/dL — ABNORMAL LOW (ref 13.0–17.0)
MCH: 33 pg (ref 26.0–34.0)
MCHC: 32 g/dL (ref 30.0–36.0)
MCV: 103 fL — ABNORMAL HIGH (ref 80.0–100.0)
Platelets: 174 10*3/uL (ref 150–400)
RBC: 3.64 MIL/uL — ABNORMAL LOW (ref 4.22–5.81)
RDW: 14.9 % (ref 11.5–15.5)
WBC: 7.4 10*3/uL (ref 4.0–10.5)
nRBC: 0 % (ref 0.0–0.2)

## 2022-11-05 LAB — COMPREHENSIVE METABOLIC PANEL
ALT: 14 U/L (ref 0–44)
AST: 26 U/L (ref 15–41)
Albumin: 3.8 g/dL (ref 3.5–5.0)
Alkaline Phosphatase: 80 U/L (ref 38–126)
Anion gap: 11 (ref 5–15)
BUN: 27 mg/dL — ABNORMAL HIGH (ref 8–23)
CO2: 22 mmol/L (ref 22–32)
Calcium: 9.1 mg/dL (ref 8.9–10.3)
Chloride: 107 mmol/L (ref 98–111)
Creatinine, Ser: 1.24 mg/dL (ref 0.61–1.24)
GFR, Estimated: 54 mL/min — ABNORMAL LOW (ref >60)
Glucose, Bld: 143 mg/dL — ABNORMAL HIGH (ref 70–99)
Potassium: 3.9 mmol/L (ref 3.5–5.1)
Sodium: 140 mmol/L (ref 135–145)
Total Bilirubin: 1 mg/dL (ref 0.3–1.2)
Total Protein: 6.6 g/dL (ref 6.5–8.1)

## 2022-11-05 LAB — TYPE AND SCREEN
ABO/RH(D): A NEG
Antibody Screen: NEGATIVE

## 2022-11-05 LAB — SURGICAL PCR SCREEN
MRSA, PCR: NEGATIVE
Staphylococcus aureus: NEGATIVE

## 2022-11-05 NOTE — Progress Notes (Signed)
PCP - Dr. Melissa Montane Cardiologist - Dr. Hannah Beat  PPM/ICD - Denies  Chest x-ray - 12/17/2020 EKG - 06/13/2022 Stress Test - 12/2015 ECHO - 01/12/2016 Cardiac Cath - 02/21/2017  Sleep Study - Denies CPAP - Denies  Non-diabetic Last dose of GLP1 agonist-  na GLP1 instructions: na  Blood Thinner Instructions: Stop Plavix 5 days prior to surgery, Last does patient took 10/31/2022 Aspirin Instructions: Last time patient took 11/02/2022  ERAS Protcol - Yes PRE-SURGERY Ensure   COVID TEST- Denies   Anesthesia review: Yes. HTN, CAD, MI, stroke, first degree block, angina, bypass, stents, VTACH during stress test  Patient denies shortness of breath, fever, cough and chest pain at PAT appointment   All instructions explained to the patient, with a verbal understanding of the material. Patient agrees to go over the instructions while at home for a better understanding. Patient also instructed to self quarantine after being tested for COVID-19. The opportunity to ask questions was provided.

## 2022-11-05 NOTE — Progress Notes (Signed)
Spoke with Karoline Caldwell, PA-C, regarding patients elevated BP in PAT.  Patient had not taken his Lisinopril today.  Advised patient monitor his pressure at home and if it stays elevated he will need to contact his PCP. Patient denies CP, SOB, dizziness, lightheadedness or headache.

## 2022-11-06 ENCOUNTER — Other Ambulatory Visit: Payer: Self-pay | Admitting: Cardiovascular Disease

## 2022-11-13 ENCOUNTER — Ambulatory Visit (HOSPITAL_COMMUNITY): Payer: Medicare Other | Admitting: Physician Assistant

## 2022-11-13 ENCOUNTER — Ambulatory Visit (HOSPITAL_COMMUNITY): Payer: Medicare Other

## 2022-11-13 ENCOUNTER — Encounter (HOSPITAL_COMMUNITY)
Admission: RE | Disposition: A | Discharge status: Home Health | Payer: Self-pay | Source: Home / Self Care | Attending: Orthopaedic Surgery

## 2022-11-13 ENCOUNTER — Inpatient Hospital Stay (HOSPITAL_COMMUNITY)
Admission: RE | Admit: 2022-11-13 | Discharge: 2022-11-17 | DRG: 470 | Disposition: A | Discharge status: Home Health | Payer: Medicare Other | Attending: Orthopaedic Surgery | Admitting: Orthopaedic Surgery

## 2022-11-13 ENCOUNTER — Other Ambulatory Visit: Payer: Self-pay

## 2022-11-13 ENCOUNTER — Encounter (HOSPITAL_COMMUNITY): Payer: Self-pay | Admitting: Orthopaedic Surgery

## 2022-11-13 DIAGNOSIS — K219 Gastro-esophageal reflux disease without esophagitis: Secondary | ICD-10-CM | POA: Diagnosis present

## 2022-11-13 DIAGNOSIS — Z8249 Family history of ischemic heart disease and other diseases of the circulatory system: Secondary | ICD-10-CM

## 2022-11-13 DIAGNOSIS — Z888 Allergy status to other drugs, medicaments and biological substances status: Secondary | ICD-10-CM

## 2022-11-13 DIAGNOSIS — I10 Essential (primary) hypertension: Secondary | ICD-10-CM

## 2022-11-13 DIAGNOSIS — I129 Hypertensive chronic kidney disease with stage 1 through stage 4 chronic kidney disease, or unspecified chronic kidney disease: Secondary | ICD-10-CM | POA: Diagnosis present

## 2022-11-13 DIAGNOSIS — Z79899 Other long term (current) drug therapy: Secondary | ICD-10-CM

## 2022-11-13 DIAGNOSIS — R001 Bradycardia, unspecified: Secondary | ICD-10-CM | POA: Diagnosis not present

## 2022-11-13 DIAGNOSIS — I2510 Atherosclerotic heart disease of native coronary artery without angina pectoris: Secondary | ICD-10-CM | POA: Diagnosis not present

## 2022-11-13 DIAGNOSIS — Z8673 Personal history of transient ischemic attack (TIA), and cerebral infarction without residual deficits: Secondary | ICD-10-CM

## 2022-11-13 DIAGNOSIS — Z955 Presence of coronary angioplasty implant and graft: Secondary | ICD-10-CM

## 2022-11-13 DIAGNOSIS — Z79890 Hormone replacement therapy (postmenopausal): Secondary | ICD-10-CM

## 2022-11-13 DIAGNOSIS — I252 Old myocardial infarction: Secondary | ICD-10-CM

## 2022-11-13 DIAGNOSIS — E039 Hypothyroidism, unspecified: Secondary | ICD-10-CM | POA: Diagnosis present

## 2022-11-13 DIAGNOSIS — Z7902 Long term (current) use of antithrombotics/antiplatelets: Secondary | ICD-10-CM

## 2022-11-13 DIAGNOSIS — Z7982 Long term (current) use of aspirin: Secondary | ICD-10-CM

## 2022-11-13 DIAGNOSIS — Z8616 Personal history of COVID-19: Secondary | ICD-10-CM

## 2022-11-13 DIAGNOSIS — Z96649 Presence of unspecified artificial hip joint: Secondary | ICD-10-CM

## 2022-11-13 DIAGNOSIS — I480 Paroxysmal atrial fibrillation: Secondary | ICD-10-CM | POA: Diagnosis present

## 2022-11-13 DIAGNOSIS — Z800 Family history of malignant neoplasm of digestive organs: Secondary | ICD-10-CM

## 2022-11-13 DIAGNOSIS — Z96642 Presence of left artificial hip joint: Secondary | ICD-10-CM

## 2022-11-13 DIAGNOSIS — Z833 Family history of diabetes mellitus: Secondary | ICD-10-CM

## 2022-11-13 DIAGNOSIS — M1612 Unilateral primary osteoarthritis, left hip: Secondary | ICD-10-CM | POA: Diagnosis not present

## 2022-11-13 DIAGNOSIS — E785 Hyperlipidemia, unspecified: Secondary | ICD-10-CM | POA: Diagnosis present

## 2022-11-13 DIAGNOSIS — Z951 Presence of aortocoronary bypass graft: Secondary | ICD-10-CM

## 2022-11-13 DIAGNOSIS — D631 Anemia in chronic kidney disease: Secondary | ICD-10-CM | POA: Diagnosis present

## 2022-11-13 HISTORY — PX: TOTAL HIP ARTHROPLASTY: SHX124

## 2022-11-13 SURGERY — ARTHROPLASTY, HIP, TOTAL, ANTERIOR APPROACH
Anesthesia: Spinal | Site: Hip | Laterality: Left

## 2022-11-13 MED ORDER — ALLOPURINOL 100 MG PO TABS
100.0000 mg | ORAL_TABLET | Freq: Every day | ORAL | Status: DC
  Administered 2022-11-14 – 2022-11-17 (×4): 100 mg via ORAL
  Filled 2022-11-13 (×4): qty 1

## 2022-11-13 MED ORDER — METOCLOPRAMIDE HCL 5 MG/ML IJ SOLN: 5.0000 mg | Freq: Three times a day (TID) | INTRAMUSCULAR | Status: DC | PRN

## 2022-11-13 MED ORDER — SIMVASTATIN 20 MG PO TABS
20.0000 mg | ORAL_TABLET | Freq: Every day | ORAL | Status: DC
  Administered 2022-11-14 – 2022-11-17 (×4): 20 mg via ORAL
  Filled 2022-11-13 (×4): qty 1

## 2022-11-13 MED ORDER — CEFAZOLIN SODIUM-DEXTROSE 2-3 GM-%(50ML) IV SOLR
INTRAVENOUS | Status: DC | PRN
  Administered 2022-11-13: 2 g via INTRAVENOUS

## 2022-11-13 MED ORDER — ONDANSETRON HCL 4 MG PO TABS: 4.0000 mg | ORAL_TABLET | Freq: Four times a day (QID) | ORAL | Status: DC | PRN

## 2022-11-13 MED ORDER — METOCLOPRAMIDE HCL 5 MG PO TABS: 5.0000 mg | ORAL_TABLET | Freq: Three times a day (TID) | ORAL | Status: DC | PRN

## 2022-11-13 MED ORDER — ACETAMINOPHEN 325 MG PO TABS
325.0000 mg | ORAL_TABLET | Freq: Four times a day (QID) | ORAL | Status: DC | PRN
  Administered 2022-11-14 – 2022-11-16 (×3): 650 mg via ORAL
  Filled 2022-11-13 (×3): qty 2

## 2022-11-13 MED ORDER — SODIUM CHLORIDE 0.9 % IR SOLN
Status: DC | PRN
  Administered 2022-11-13: 1000 mL

## 2022-11-13 MED ORDER — HYDROCODONE-ACETAMINOPHEN 7.5-325 MG PO TABS
1.0000 | ORAL_TABLET | ORAL | Status: DC | PRN
  Administered 2022-11-13: 1 via ORAL
  Filled 2022-11-13: qty 2

## 2022-11-13 MED ORDER — OXYCODONE HCL 5 MG/5ML PO SOLN: 5.0000 mg | Freq: Once | ORAL | Status: DC | PRN

## 2022-11-13 MED ORDER — FOLIC ACID 1 MG PO TABS
1.0000 mg | ORAL_TABLET | Freq: Every day | ORAL | Status: DC
  Administered 2022-11-13 – 2022-11-17 (×5): 1 mg via ORAL
  Filled 2022-11-13 (×5): qty 1

## 2022-11-13 MED ORDER — GLYCOPYRROLATE PF 0.2 MG/ML IJ SOSY
PREFILLED_SYRINGE | INTRAMUSCULAR | Status: DC | PRN
  Administered 2022-11-13: .2 mg via INTRAVENOUS

## 2022-11-13 MED ORDER — CHLORHEXIDINE GLUCONATE 0.12 % MT SOLN
OROMUCOSAL | Status: AC
  Administered 2022-11-13: 15 mL via OROMUCOSAL
  Filled 2022-11-13: qty 15

## 2022-11-13 MED ORDER — CLOPIDOGREL BISULFATE 75 MG PO TABS
75.0000 mg | ORAL_TABLET | Freq: Every day | ORAL | Status: DC
  Administered 2022-11-14 – 2022-11-17 (×4): 75 mg via ORAL
  Filled 2022-11-13 (×4): qty 1

## 2022-11-13 MED ORDER — RANOLAZINE ER 500 MG PO TB12
1000.0000 mg | ORAL_TABLET | Freq: Every day | ORAL | Status: DC
  Administered 2022-11-14 – 2022-11-17 (×4): 1000 mg via ORAL
  Filled 2022-11-13 (×4): qty 2

## 2022-11-13 MED ORDER — TRANEXAMIC ACID-NACL 1000-0.7 MG/100ML-% IV SOLN
1000.0000 mg | INTRAVENOUS | Status: AC
  Administered 2022-11-13: 1000 mg via INTRAVENOUS

## 2022-11-13 MED ORDER — CEFAZOLIN SODIUM-DEXTROSE 1-4 GM/50ML-% IV SOLN
1.0000 g | Freq: Four times a day (QID) | INTRAVENOUS | Status: AC
  Administered 2022-11-13 – 2022-11-14 (×2): 1 g via INTRAVENOUS
  Filled 2022-11-13 (×2): qty 50

## 2022-11-13 MED ORDER — LACTATED RINGERS IV SOLN: INTRAVENOUS | Status: DC

## 2022-11-13 MED ORDER — PHENYLEPHRINE HCL-NACL 20-0.9 MG/250ML-% IV SOLN
INTRAVENOUS | Status: DC | PRN
  Administered 2022-11-13: 40 ug/min via INTRAVENOUS

## 2022-11-13 MED ORDER — METHOCARBAMOL 500 MG PO TABS
500.0000 mg | ORAL_TABLET | Freq: Four times a day (QID) | ORAL | Status: DC | PRN
  Administered 2022-11-14 (×2): 500 mg via ORAL
  Filled 2022-11-13 (×2): qty 1

## 2022-11-13 MED ORDER — STERILE WATER FOR IRRIGATION IR SOLN
Status: DC | PRN
  Administered 2022-11-13: 1000 mL

## 2022-11-13 MED ORDER — ORAL CARE MOUTH RINSE: 15.0000 mL | Freq: Once | OROMUCOSAL | Status: AC

## 2022-11-13 MED ORDER — DOCUSATE SODIUM 100 MG PO CAPS
100.0000 mg | ORAL_CAPSULE | Freq: Two times a day (BID) | ORAL | Status: DC
  Administered 2022-11-13 – 2022-11-17 (×7): 100 mg via ORAL
  Filled 2022-11-13 (×7): qty 1

## 2022-11-13 MED ORDER — CEFAZOLIN SODIUM-DEXTROSE 2-4 GM/100ML-% IV SOLN
2.0000 g | INTRAVENOUS | Status: AC
  Administered 2022-11-13: 2 g via INTRAVENOUS

## 2022-11-13 MED ORDER — ADULT MULTIVITAMIN W/MINERALS CH
1.0000 | ORAL_TABLET | Freq: Every day | ORAL | Status: DC
  Administered 2022-11-13 – 2022-11-17 (×5): 1 via ORAL
  Filled 2022-11-13 (×5): qty 1

## 2022-11-13 MED ORDER — LISINOPRIL 20 MG PO TABS
20.0000 mg | ORAL_TABLET | Freq: Every day | ORAL | Status: DC
  Administered 2022-11-14 – 2022-11-17 (×4): 20 mg via ORAL
  Filled 2022-11-13 (×4): qty 1

## 2022-11-13 MED ORDER — BUPIVACAINE IN DEXTROSE 0.75-8.25 % IT SOLN
INTRATHECAL | Status: DC | PRN
  Administered 2022-11-13: 1.6 mL via INTRATHECAL

## 2022-11-13 MED ORDER — ISOSORBIDE MONONITRATE ER 60 MG PO TB24
90.0000 mg | ORAL_TABLET | Freq: Every day | ORAL | Status: DC
  Administered 2022-11-14 – 2022-11-17 (×4): 90 mg via ORAL
  Filled 2022-11-13 (×4): qty 1

## 2022-11-13 MED ORDER — POVIDONE-IODINE 10 % EX SWAB
2.0000 | Freq: Once | CUTANEOUS | Status: AC
  Administered 2022-11-13: 2 via TOPICAL

## 2022-11-13 MED ORDER — ONDANSETRON HCL 4 MG/2ML IJ SOLN: 4.0000 mg | Freq: Four times a day (QID) | INTRAMUSCULAR | Status: DC | PRN

## 2022-11-13 MED ORDER — VITAMIN D 25 MCG (1000 UNIT) PO TABS
1000.0000 [IU] | ORAL_TABLET | Freq: Every day | ORAL | Status: DC
  Administered 2022-11-14 – 2022-11-17 (×4): 1000 [IU] via ORAL
  Filled 2022-11-13 (×4): qty 1

## 2022-11-13 MED ORDER — HYDROCODONE-ACETAMINOPHEN 5-325 MG PO TABS
1.0000 | ORAL_TABLET | ORAL | Status: DC | PRN
  Administered 2022-11-14: 2 via ORAL
  Filled 2022-11-13: qty 2

## 2022-11-13 MED ORDER — ALUM & MAG HYDROXIDE-SIMETH 200-200-20 MG/5ML PO SUSP: 30.0000 mL | ORAL | Status: DC | PRN

## 2022-11-13 MED ORDER — CEFAZOLIN SODIUM-DEXTROSE 2-4 GM/100ML-% IV SOLN
INTRAVENOUS | Status: AC
  Filled 2022-11-13: qty 100

## 2022-11-13 MED ORDER — PROPOFOL 500 MG/50ML IV EMUL
INTRAVENOUS | Status: DC | PRN
  Administered 2022-11-13: 80 ug/kg/min via INTRAVENOUS

## 2022-11-13 MED ORDER — ZINC SULFATE 220 (50 ZN) MG PO CAPS
220.0000 mg | ORAL_CAPSULE | Freq: Every day | ORAL | Status: DC
  Administered 2022-11-13 – 2022-11-17 (×5): 220 mg via ORAL
  Filled 2022-11-13 (×5): qty 1

## 2022-11-13 MED ORDER — PANTOPRAZOLE SODIUM 40 MG PO TBEC
40.0000 mg | DELAYED_RELEASE_TABLET | Freq: Every day | ORAL | Status: DC
  Administered 2022-11-13 – 2022-11-17 (×5): 40 mg via ORAL
  Filled 2022-11-13 (×5): qty 1

## 2022-11-13 MED ORDER — ASPIRIN 81 MG PO TBEC
81.0000 mg | DELAYED_RELEASE_TABLET | Freq: Every day | ORAL | Status: DC
  Administered 2022-11-14 – 2022-11-17 (×4): 81 mg via ORAL
  Filled 2022-11-13 (×4): qty 1

## 2022-11-13 MED ORDER — DIPHENHYDRAMINE HCL 12.5 MG/5ML PO ELIX: 12.5000 mg | ORAL_SOLUTION | ORAL | Status: DC | PRN

## 2022-11-13 MED ORDER — SODIUM CHLORIDE 0.9 % IV SOLN: INTRAVENOUS | Status: DC

## 2022-11-13 MED ORDER — VITAMIN D-3 25 MCG (1000 UT) PO CAPS: 1000.0000 [IU] | ORAL_CAPSULE | Freq: Every day | ORAL | Status: DC

## 2022-11-13 MED ORDER — MENTHOL 3 MG MT LOZG: 1.0000 | LOZENGE | OROMUCOSAL | Status: DC | PRN

## 2022-11-13 MED ORDER — PHENOL 1.4 % MT LIQD: 1.0000 | OROMUCOSAL | Status: DC | PRN

## 2022-11-13 MED ORDER — MORPHINE SULFATE (PF) 2 MG/ML IV SOLN
1.0000 mg | INTRAVENOUS | Status: DC | PRN
  Administered 2022-11-14: 2 mg via INTRAVENOUS
  Filled 2022-11-13: qty 1

## 2022-11-13 MED ORDER — NITROGLYCERIN 0.4 MG SL SUBL: 0.4000 mg | SUBLINGUAL_TABLET | SUBLINGUAL | Status: DC | PRN

## 2022-11-13 MED ORDER — CHLORHEXIDINE GLUCONATE 0.12 % MT SOLN: 15.0000 mL | Freq: Once | OROMUCOSAL | Status: AC

## 2022-11-13 MED ORDER — ACETAMINOPHEN 500 MG PO TABS
ORAL_TABLET | ORAL | Status: AC
  Administered 2022-11-13: 500 mg via ORAL
  Filled 2022-11-13: qty 2

## 2022-11-13 MED ORDER — OXYCODONE HCL 5 MG PO TABS: 5.0000 mg | ORAL_TABLET | Freq: Once | ORAL | Status: DC | PRN

## 2022-11-13 MED ORDER — LEVOTHYROXINE SODIUM 25 MCG PO TABS
25.0000 ug | ORAL_TABLET | Freq: Every day | ORAL | Status: DC
  Administered 2022-11-14 – 2022-11-17 (×4): 25 ug via ORAL
  Filled 2022-11-13 (×4): qty 1

## 2022-11-13 MED ORDER — FENTANYL CITRATE (PF) 100 MCG/2ML IJ SOLN: 25.0000 ug | INTRAMUSCULAR | Status: DC | PRN

## 2022-11-13 MED ORDER — ACETAMINOPHEN 500 MG PO TABS: 1000.0000 mg | ORAL_TABLET | Freq: Once | ORAL | Status: AC

## 2022-11-13 MED ORDER — VITAMIN C 500 MG PO TABS
500.0000 mg | ORAL_TABLET | Freq: Every day | ORAL | Status: DC
  Administered 2022-11-13 – 2022-11-17 (×5): 500 mg via ORAL
  Filled 2022-11-13 (×5): qty 1

## 2022-11-13 MED ORDER — FERROUS SULFATE 325 (65 FE) MG PO TABS
325.0000 mg | ORAL_TABLET | Freq: Two times a day (BID) | ORAL | Status: DC
  Administered 2022-11-14 – 2022-11-17 (×7): 325 mg via ORAL
  Filled 2022-11-13 (×7): qty 1

## 2022-11-13 MED ORDER — 0.9 % SODIUM CHLORIDE (POUR BTL) OPTIME
TOPICAL | Status: DC | PRN
  Administered 2022-11-13: 1000 mL

## 2022-11-13 MED ORDER — TRANEXAMIC ACID-NACL 1000-0.7 MG/100ML-% IV SOLN
INTRAVENOUS | Status: AC
  Filled 2022-11-13: qty 100

## 2022-11-13 MED ORDER — METHOCARBAMOL 1000 MG/10ML IJ SOLN: 500.0000 mg | Freq: Four times a day (QID) | INTRAVENOUS | Status: DC | PRN

## 2022-11-13 MED ORDER — EPHEDRINE SULFATE (PRESSORS) 50 MG/ML IJ SOLN
INTRAMUSCULAR | Status: DC | PRN
  Administered 2022-11-13: 10 mg via INTRAVENOUS

## 2022-11-13 MED ORDER — MULTIVITAMINS PO CAPS: 1.0000 | ORAL_CAPSULE | Freq: Every day | ORAL | Status: DC

## 2022-11-13 SURGICAL SUPPLY — 56 items
APL SKNCLS STERI-STRIP NONHPOA (GAUZE/BANDAGES/DRESSINGS) ×1
BAG COUNTER SPONGE SURGICOUNT (BAG) ×2 IMPLANT
BAG SPNG CNTER NS LX DISP (BAG) ×1
BENZOIN TINCTURE PRP APPL 2/3 (GAUZE/BANDAGES/DRESSINGS) ×2 IMPLANT
BLADE CLIPPER SURG (BLADE) IMPLANT
BLADE SAW SGTL 18X1.27X75 (BLADE) ×2 IMPLANT
COVER SURGICAL LIGHT HANDLE (MISCELLANEOUS) ×2 IMPLANT
DRAPE C-ARM 42X72 X-RAY (DRAPES) ×2 IMPLANT
DRAPE STERI IOBAN 125X83 (DRAPES) ×2 IMPLANT
DRAPE U-SHAPE 47X51 STRL (DRAPES) ×6 IMPLANT
DRSG AQUACEL AG ADV 3.5X10 (GAUZE/BANDAGES/DRESSINGS) ×2 IMPLANT
DURAPREP 26ML APPLICATOR (WOUND CARE) ×2 IMPLANT
ELECT BLADE 4.0 EZ CLEAN MEGAD (MISCELLANEOUS) ×1
ELECT BLADE 6.5 EXT (BLADE) IMPLANT
ELECT REM PT RETURN 9FT ADLT (ELECTROSURGICAL) ×1
ELECTRODE BLDE 4.0 EZ CLN MEGD (MISCELLANEOUS) ×2 IMPLANT
ELECTRODE REM PT RTRN 9FT ADLT (ELECTROSURGICAL) ×2 IMPLANT
FACESHIELD WRAPAROUND (MASK) ×2 IMPLANT
FACESHIELD WRAPAROUND OR TEAM (MASK) ×4 IMPLANT
FEM STEM 12/14 TAPER SZ 4 HIP (Orthopedic Implant) ×1 IMPLANT
FEMORAL STEM 12/14 TPR SZ4 HIP (Orthopedic Implant) IMPLANT
GLOVE BIOGEL PI IND STRL 8 (GLOVE) ×4 IMPLANT
GLOVE ECLIPSE 8.0 STRL XLNG CF (GLOVE) ×2 IMPLANT
GLOVE ORTHO TXT STRL SZ7.5 (GLOVE) ×4 IMPLANT
GOWN STRL REUS W/ TWL LRG LVL3 (GOWN DISPOSABLE) ×4 IMPLANT
GOWN STRL REUS W/ TWL XL LVL3 (GOWN DISPOSABLE) ×4 IMPLANT
GOWN STRL REUS W/TWL LRG LVL3 (GOWN DISPOSABLE) ×2
GOWN STRL REUS W/TWL XL LVL3 (GOWN DISPOSABLE) ×2
HANDPIECE INTERPULSE COAX TIP (DISPOSABLE) ×1
HEAD M SROM 36MM PLUS 1.5 (Hips) IMPLANT
KIT BASIN OR (CUSTOM PROCEDURE TRAY) ×2 IMPLANT
KIT TURNOVER KIT B (KITS) ×2 IMPLANT
LINER NEUTRAL 52X36MM PLUS 4 (Liner) IMPLANT
MANIFOLD NEPTUNE II (INSTRUMENTS) ×2 IMPLANT
NS IRRIG 1000ML POUR BTL (IV SOLUTION) ×2 IMPLANT
PACK TOTAL JOINT (CUSTOM PROCEDURE TRAY) ×2 IMPLANT
PAD ARMBOARD 7.5X6 YLW CONV (MISCELLANEOUS) ×2 IMPLANT
PIN SECTOR W/GRIP ACE CUP 52MM (Hips) IMPLANT
SET HNDPC FAN SPRY TIP SCT (DISPOSABLE) ×2 IMPLANT
SROM M HEAD 36MM PLUS 1.5 (Hips) ×1 IMPLANT
STAPLER VISISTAT 35W (STAPLE) IMPLANT
STRIP CLOSURE SKIN 1/2X4 (GAUZE/BANDAGES/DRESSINGS) ×4 IMPLANT
SUT ETHIBOND NAB CT1 #1 30IN (SUTURE) ×2 IMPLANT
SUT MNCRL AB 4-0 PS2 18 (SUTURE) IMPLANT
SUT VIC AB 0 CT1 27 (SUTURE) ×1
SUT VIC AB 0 CT1 27XBRD ANBCTR (SUTURE) ×2 IMPLANT
SUT VIC AB 1 CT1 27 (SUTURE) ×1
SUT VIC AB 1 CT1 27XBRD ANBCTR (SUTURE) ×2 IMPLANT
SUT VIC AB 2-0 CT1 27 (SUTURE) ×2
SUT VIC AB 2-0 CT1 TAPERPNT 27 (SUTURE) ×2 IMPLANT
TOWEL GREEN STERILE (TOWEL DISPOSABLE) ×2 IMPLANT
TOWEL GREEN STERILE FF (TOWEL DISPOSABLE) ×2 IMPLANT
TRAY CATH INTERMITTENT SS 16FR (CATHETERS) IMPLANT
TRAY FOLEY W/BAG SLVR 16FR (SET/KITS/TRAYS/PACK)
TRAY FOLEY W/BAG SLVR 16FR ST (SET/KITS/TRAYS/PACK) IMPLANT
WATER STERILE IRR 1000ML POUR (IV SOLUTION) ×4 IMPLANT

## 2022-11-13 NOTE — Interval H&P Note (Signed)
History and Physical Interval Note: The patient understands that he is here today for a left total hip replacement to treat his severe left hip arthritis.  There has been no acute change in his medical status.  See H&P.  The risks and benefits of surgery been described in detail and informed consent is obtained.  The left hip has been marked.  Family is also at the bedside.  They understand given his advanced age and comorbidities that there is certainly high risk with the surgery but the goal was decreased pain, improve mobility, and improve quality of life given the severity of his arthritis.  He does wish to proceed and gives consent.  11/13/2022 1:57 PM  [name]  has presented today for surgery, with the diagnosis of left hip endstage osteoarthritis.  The various methods of treatment have been discussed with the patient and family. After consideration of risks, benefits and other options for treatment, the patient has consented to  Procedure(s): LEFT TOTAL HIP ARTHROPLASTY ANTERIOR APPROACH (Left) as a surgical intervention.  The patient's history has been reviewed, patient examined, no change in status, stable for surgery.  I have reviewed the patient's chart and labs.  Questions were answered to the patient's satisfaction.     Mcarthur Rossetti

## 2022-11-13 NOTE — Progress Notes (Signed)
Cardiology at bedside.

## 2022-11-13 NOTE — H&P (Signed)
TOTAL HIP ADMISSION H&P  Patient is admitted for left total hip arthroplasty.  Subjective:  Chief Complaint: left hip pain  HPI: [name], 89 y.o. male, has a history of pain and functional disability in the left hip(s) due to arthritis and patient has failed non-surgical conservative treatments for greater than 12 weeks to include NSAID's and/or analgesics, corticosteriod injections, use of assistive devices, and activity modification.  Onset of symptoms was gradual starting 5 years ago with gradually worsening course since that time.The patient noted no past surgery on the left hip(s).  Patient currently rates pain in the left hip at 10 out of 10 with activity. Patient has night pain, worsening of pain with activity and weight bearing, trendelenberg gait, pain that interfers with activities of daily living, and pain with passive range of motion. Patient has evidence of subchondral cysts, subchondral sclerosis, periarticular osteophytes, and joint space narrowing by imaging studies. This condition presents safety issues increasing the risk of falls. There is no current active infection.  Patient Active Problem List   Diagnosis Date Noted   Unilateral primary osteoarthritis, left hip 11/13/2022   History of stroke 04/10/2022   Anemia due to chronic kidney disease 02/06/2022   Macular degeneration 02/06/2022   Multifocal pneumonia 12/17/2020   Pneumonia due to COVID-19 virus 12/17/2020   New onset atrial fibrillation ([place]) 12/17/2020   CKD (chronic kidney disease), stage III ([place]) 12/17/2020   Anemia in chronic kidney disease (CKD) 12/17/2020   Poor balance 10/06/2019   Laryngitis 04/17/2017   Cough 03/29/2017   Acute bronchitis 03/29/2017   Acute upper respiratory infection 03/29/2017   Presence of drug coated stent in RCA & Ramus Intermedius. 02/28/2017   Gout flare 02/22/2017   Coronary artery disease involving native coronary artery of native heart with angina pectoris ([place])     Carotid artery disease (H[facility])    HLD (hyperlipidemia)    Unstable angina ([place]) 02/20/2017   Atherosclerosis of native coronary artery of native heart    Essential hypertension 01/06/2009   Second degree atrioventricular block, Mobitz type I 01/06/2009   AV block, 1st degree 01/06/2009   Past Medical History:  Diagnosis Date   Arthritis    AV BLOCK, 1ST DEGREE    Carotid artery disease ([place])    a. s/p Left ECA followed by Dr. Donnetta Hutching   Coronary artery disease    Coronary artery disease involving coronary bypass graft of native heart with angina pectoris ([place])    a. LHC 12/2015  3vd Left Cx 100% with colaterals, and distally LAD occluded, DES to prox-mid RCA  b. 02/2017: repeat cath with patent RCA stent and stable CAD; 02/27/2017: Rota-PCI to Ost RI   GERD (gastroesophageal reflux disease)    HLD (hyperlipidemia)    HTN (hypertension)    Hypothyroidism    Myocardial infarction ([place])    20 years   NSVT (nonsustained ventricular tachycardia) ([place])    during stress test 12/2015   Presence of drug coated stent in RCA & Ramus Intermedius. 02/28/2017   12/2015: PCI p-mRCA - Stent Resolute Integ 3.5x34 02/2017: Rota-PCI o-mRamus - overlapping DES STENT SYNERGY DES 3X32  & 3x24 (tapered post-dilation)   Stroke [facility])     Past Surgical History:  Procedure Laterality Date   CARDIAC CATHETERIZATION     x2   CARDIAC CATHETERIZATION N/A 01/11/2016   Procedure: Left Heart Cath and Coronary Angiography;  Surgeon: Wellington Hampshire, MD;  Location: [facility];  Service: Cardiovascular;  Laterality: N/A;  CAROTID ENDARTERECTOMY  11/28/2006   left   CATARACT EXTRACTION W/ INTRAOCULAR LENS IMPLANT Bilateral    CORONARY ATHERECTOMY N/A 02/27/2017   Procedure: Coronary Atherectomy;  Surgeon: Troy Sine, MD;  Location: [facility] [facility];  Service: Cardiovascular;  Laterality: N/A;   HERNIA REPAIR     LEFT HEART CATH AND CORONARY ANGIOGRAPHY N/A 02/21/2017   Procedure: Left Heart Cath and  Coronary Angiography;  Surgeon: Lorretta Harp, MD;  Location: [facility];  Service: Cardiovascular;  Laterality: N/A;   TONSILLECTOMY      Current Facility-Administered Medications  Medication Dose Route Frequency Provider Last Rate Last Admin   0.9 %  sodium chloride infusion   Intravenous PRN Samuella Cota, MD       0.9 %  sodium chloride infusion   Intravenous PRN Carrizo Callas, NP       Current Outpatient Medications  Medication Sig Dispense Refill Last Dose   acetaminophen (TYLENOL) 500 MG tablet Take 1 tablet (500 mg total) by mouth every 6 (six) hours as needed for moderate pain (wrist). (Patient taking differently: Take 500 mg by mouth daily.) 30 tablet 0    allopurinol (ZYLOPRIM) 100 MG tablet Take 100 mg by mouth daily.      aspirin EC 81 MG EC tablet Take 1 tablet (81 mg total) by mouth daily.      b complex vitamins tablet Take 1 tablet by mouth daily.      Calcium Carbonate (CALCIUM 500 PO) Take 500 mg by mouth daily.      Calcium Carbonate Antacid (TUMS PO) Take 1 tablet by mouth as needed (heartburn).      Cholecalciferol (VITAMIN D-3) 1000 units CAPS Take 1,000 Units by mouth daily.      clopidogrel (PLAVIX) 75 MG tablet TAKE 1 TABLET DAILY (Patient taking differently: Take 75 mg by mouth daily.) 90 tablet 3    ferrous sulfate 325 (65 FE) MG tablet Take 325 mg by mouth 2 (two) times daily with a meal.      folic acid (FOLVITE) 1 MG tablet Take 1 tablet (1 mg total) by mouth daily.      Glucosamine 500 MG CAPS Take 500 mg by mouth daily.      levothyroxine (SYNTHROID) 25 MCG tablet Take 25 mcg by mouth daily.      lisinopril (ZESTRIL) 20 MG tablet Take 1 tablet (20 mg total) by mouth daily. 90 tablet 3    Multiple Vitamin (MULTIVITAMIN) capsule Take 1 capsule by mouth daily.      nitroGLYCERIN (NITROSTAT) 0.4 MG SL tablet PLACE 1 TABLET UNDER THE TONGUE EVERY 5 MINUTES AS NEEDED FOR CHEST PAIN. NOT TO EXCEED 3 DOSES (Patient taking differently: Place 0.4  mg under the tongue every 5 (five) minutes as needed for chest pain.) 25 tablet 6    Omega-3 Fatty Acids (FISH OIL) 1000 MG CAPS Take 1,000 mg by mouth daily.      ranolazine (RANEXA) 1000 MG SR tablet TAKE 1 TABLET TWICE A DAY (Patient taking differently: Take 1,000 mg by mouth once.) 180 tablet 3    simvastatin (ZOCOR) 20 MG tablet TAKE 1 TABLET DAILY (Patient taking differently: Take 20 mg by mouth daily.) 90 tablet 1    vitamin C (ASCORBIC ACID) 500 MG tablet Take 500 mg by mouth daily.      zinc sulfate 220 (50 Zn) MG capsule Take 1 capsule (220 mg total) by mouth daily.      Coenzyme Q10 (  CO Q10) 100 MG CAPS Take 100 mg by mouth daily. (Patient not taking: Reported on 09/18/2022) 90 capsule 3 Not Taking   isosorbide mononitrate (IMDUR) 60 MG 24 hr tablet TAKE ONE AND ONE-HALF TABLETS DAILY 135 tablet 2    thiamine 100 MG tablet Take 1 tablet (100 mg total) by mouth daily. (Patient not taking: Reported on 09/18/2022)   Not Taking   Allergies  Allergen Reactions   Beta Adrenergic Blockers Other (See Comments)    Hx of AV block. Should avoid    Social History   Tobacco Use   Smoking status: Never   Smokeless tobacco: Never  Substance Use Topics   Alcohol use: Yes    Alcohol/week: 7.0 standard drinks of alcohol    Types: 7 Cans of beer per week    Comment: 7 cans/day    Family History  Problem Relation Age of Onset   Diabetes Mother    Diabetes Sister    Cancer Sister 47       colon   Heart disease Sister      Review of Systems  Objective:  Physical Exam Vitals reviewed.  Constitutional:      Appearance: Normal appearance. He is normal weight.  HENT:     Head: Normocephalic and atraumatic.  Eyes:     Extraocular Movements: Extraocular movements intact.     Pupils: Pupils are equal, round, and reactive to light.  Cardiovascular:     Rate and Rhythm: Normal rate.  Pulmonary:     Effort: Pulmonary effort is normal.  Abdominal:     Palpations: Abdomen is soft.   Musculoskeletal:     Cervical back: Normal range of motion.     Left hip: Tenderness and bony tenderness present. Decreased range of motion. Decreased strength.  Neurological:     Mental Status: He is alert. Mental status is at baseline.  Psychiatric:        Behavior: Behavior normal.     Vital signs in last 24 hours:    Labs:   Estimated body mass index is 21.83 kg/m as calculated from the following:   Height as of 11/05/22: 5\' 8"  (1.727 m).   Weight as of 11/05/22: 65.1 kg.   Imaging Review Plain radiographs demonstrate severe degenerative joint disease of the left hip(s). The bone quality appears to be good for age and reported activity level.      Assessment/Plan:  End stage arthritis, left hip(s)  The patient history, physical examination, clinical judgement of the provider and imaging studies are consistent with end stage degenerative joint disease of the left hip(s) and total hip arthroplasty is deemed medically necessary. The treatment options including medical management, injection therapy, arthroscopy and arthroplasty were discussed at length. The risks and benefits of total hip arthroplasty were presented and reviewed. The risks due to aseptic loosening, infection, stiffness, dislocation/subluxation,  thromboembolic complications and other imponderables were discussed.  The patient acknowledged the explanation, agreed to proceed with the plan and consent was signed. Patient is being admitted for inpatient treatment for surgery, pain control, PT, OT, prophylactic antibiotics, VTE prophylaxis, progressive ambulation and ADL's and discharge planning.The patient is planning to be discharged home with home health services

## 2022-11-13 NOTE — Transfer of Care (Signed)
Immediate Anesthesia Transfer of Care Note  Patient: [name]  Procedure(s) Performed: LEFT TOTAL HIP ARTHROPLASTY ANTERIOR APPROACH (Left: Hip)  Patient Location: PACU  Anesthesia Type:MAC combined with regional for post-op pain  Level of Consciousness: sedated  Airway & Oxygen Therapy: Patient Spontanous Breathing  Post-op Assessment: Report given to RN and Post -op Vital signs reviewed and stable  Post vital signs: Reviewed and stable  Last Vitals:  Vitals Value Taken Time  BP 82/51 11/13/22 1608  Temp 98   Pulse 42 11/13/22 1611  Resp 9 11/13/22 1611  SpO2 98 % 11/13/22 1611  Vitals shown include unvalidated device data.  Last Pain:  Vitals:   11/13/22 1249  PainSc: 5          Complications: No notable events documented.

## 2022-11-13 NOTE — Anesthesia Procedure Notes (Addendum)
Spinal  Patient location during procedure: OR Start time: 11/13/2022 2:31 PM End time: 11/13/2022 2:33 PM Reason for block: surgical anesthesia Staffing Performed: anesthesiologist  Anesthesiologist: Nilda Simmer, MD Performed by: Nilda Simmer, MD Authorized by: Nilda Simmer, MD   Preanesthetic Checklist Completed: patient identified, IV checked, site marked, risks and benefits discussed, surgical consent, monitors and equipment checked, pre-op evaluation and timeout performed Spinal Block Patient position: sitting Prep: DuraPrep Patient monitoring: blood pressure and continuous pulse ox Approach: midline Location: L3-4 Injection technique: single-shot Needle Needle type: Quincke  Needle gauge: 22 G Needle length: 9 cm Additional Notes Risks and benefits of neuraxial anesthesia including, but not limited to, infection, bleeding, local anesthetic toxicity, headache, hypotension, back pain, block failure, etc. were discussed with the patient. The patient expressed understanding and consented to the procedure. I confirmed that the patient has no bleeding disorders and is not taking blood thinners. I confirmed the patient's last platelet count with the nurse. Monitors were applied. A time-out was performed immediately prior to the procedure. Sterile technique was used throughout the whole procedure.   _1__ attempt(s)

## 2022-11-13 NOTE — Consult Note (Addendum)
Cardiology Consultation   Patient ID: YUSSUF ASTACIO MRN: [id number]; DOB: 1927/11/16  Admit date: 11/13/2022 Date of Consult: 11/13/2022  PCP:  [name], [place] Prov[facility] Cardiologist:  Jenkins Rouge, MD      Patient Profile:   [name] is a 89 y.o. male with a hx of CAD s/p DES to RCA '17, DES to RI '18, carotid artery disease s/p L CEA, HTN, HLD, CVA, paroxsymal Afib (not on long term [place] 2/2 falls) who is being seen 11/13/2022 for the evaluation of bradycardia at the request of Dr. Ninfa Linden.  History of Present Illness:   [name] is a 89 yo male with PMH noted above. Follows with Dr. Johnsie Cancel as an outpatient. Hx of DES to RCA in '17 which was complicated by right radial AV fistula. Repeat cardiac cath 02/2017 with PCI/DES to RI. Recommendations for lifelong DAPT. Hx of AV block while on BB therapy. Follows with [facility] for right radial artery with no need for correction.   Admitted 12/2020 with PNA/Covid and as found to have slow Afib. Felt not to be a long term candidate for [facility] given hx of falls.   Seen by Dr. Johnsie Cancel 04/2021 and reported being severely limited by hip pain. Was following with ortho for hip injections. It was felt he would be acceptable risk for THR in the future.   Had nuclear stress test 05/2022 revealed no evidence of ischemia.   Last seen in the office with Selinda Eon for preop evaluation. He was cleared to proceed and hold plavix prior to surgery. His blood pressures were soft at this visit, lisinopril was reduced to 20mg  daily. Bradley showed atrial fibrillation with rate of Ball.   Presented on 3/26 with Dr. Rush Farmer for THR. Overall, no complications reported with surgery. Had spinal anesthesia. Developed bradycardia while in PACU with HR in the 30s. He was weaned of phenylephrine. Bradley showed atrial fibrillation with SVR. Bp in the [id number] systolic at the time of assessment. Alert and mentation was good.    Past Medical History:   Diagnosis Date   Arthritis    AV BLOCK, 1ST DEGREE    Carotid artery disease ([place])    a. s/p Left ECA followed by Dr. Donnetta Hutching   Coronary artery disease    Coronary artery disease involving coronary bypass graft of native heart with angina pectoris ([place])    a. LHC 12/2015  3vd Left Cx 100% with colaterals, and distally LAD occluded, DES to prox-mid RCA  b. 02/2017: repeat cath with patent RCA stent and stable CAD; 02/27/2017: Rota-PCI to Ost RI   GERD (gastroesophageal reflux disease)    HLD (hyperlipidemia)    HTN (hypertension)    Hypothyroidism    Myocardial infarction ([place])    20 years   NSVT (nonsustained ventricular tachycardia) ([place])    during stress test 12/2015   Presence of drug coated stent in RCA & Ramus Intermedius. 02/28/2017   12/2015: PCI p-mRCA - Stent Resolute Integ 3.5x34 02/2017: Rota-PCI o-mRamus - overlapping DES STENT SYNERGY DES 3X32  & 3x24 (tapered post-dilation)   Stroke [facility])     Past Surgical History:  Procedure Laterality Date   CARDIAC CATHETERIZATION     x2   CARDIAC CATHETERIZATION N/A 01/11/2016   Procedure: Left Heart Cath and Coronary Angiography;  Surgeon: Wellington Hampshire, MD;  Location: [facility];  Service: Cardiovascular;  Laterality: N/A;   CAROTID ENDARTERECTOMY  11/28/2006   left  CATARACT EXTRACTION W/ INTRAOCULAR LENS IMPLANT Bilateral    CORONARY ATHERECTOMY N/A 02/27/2017   Procedure: Coronary Atherectomy;  Surgeon: Troy Sine, MD;  Location: [facility];  Service: Cardiovascular;  Laterality: N/A;   HERNIA REPAIR     LEFT HEART CATH AND CORONARY ANGIOGRAPHY N/A 02/21/2017   Procedure: Left Heart Cath and Coronary Angiography;  Surgeon: Lorretta Harp, MD;  Location: [facility];  Service: Cardiovascular;  Laterality: N/A;   TONSILLECTOMY       Inpatient Medications: Scheduled Meds:  Continuous Infusions:  lactated ringers 10 mL/hr at 11/13/22 1422   PRN Meds: fentaNYL (SUBLIMAZE) injection, oxyCODONE  **OR** oxyCODONE  Allergies:    Allergies  Allergen Reactions   Beta Adrenergic Blockers Other (See Comments)    Hx of AV block. Should avoid    Social History:   Social History   Socioeconomic History   Marital status: Widowed    Spouse name: Not on file   Number of children: Not on file   Years of education: Not on file   Highest education level: Not on file  Occupational History   Not on file  Tobacco Use   Smoking status: Never   Smokeless tobacco: Never  Vaping Use   Vaping Use: Never used  Substance and Sexual Activity   Alcohol use: Yes    Alcohol/week: 7.0 standard drinks of alcohol    Types: 7 Cans of beer per week    Comment: 7 cans/day   Drug use: No   Sexual activity: Not on file  Other Topics Concern   Not on file  Social History Narrative   Not on file   Social Determinants of Health   Financial Resource Strain: Not on file  Food Insecurity: Not on file  Transportation Needs: Not on file  Physical Activity: Not on file  Stress: Not on file  Social Connections: Not on file  Intimate Partner Violence: Not on file    Family History:    Family History  Problem Relation Age of Onset   Diabetes Mother    Diabetes Sister    Cancer Sister 16       colon   Heart disease Sister      ROS:  Please see the history of present illness.   All other ROS reviewed and negative.     Physical Exam/Data:   Vitals:   11/13/22 1630 11/13/22 1635 11/13/22 1645 11/13/22 1700  BP: 131/67  127/63 115/69  Pulse: (!) 41 (!) 35 (!) 32 (!) 46  Resp: 16 14 18  (!) 26  Temp:      SpO2: 90% 96% 95% 100%  Weight:      Height:        Intake/Output Summary (Last 24 hours) at 11/13/2022 1726 Last data filed at 11/13/2022 1559 Gross per 24 hour  Intake 400 ml  Output 400 ml  Net 0 ml      11/13/2022   12:32 PM 11/05/2022    1:52 PM 09/18/2022   11:08 AM  Last 3 Weights  Weight (lbs) 148 lb 143 lb 9.6 oz 148 lb 4.8 oz  Weight (kg) 67.132 kg 65.137 kg 67.268 kg      Body mass index is 22.5 kg/m.  General:  Well nourished, well developed, in no acute distress HEENT: normal Neck: no JVD Vascular: No carotid bruits; Distal pulses 2+ bilaterally Cardiac:  normal S1, S2; Irreg Irreg, brady; no murmur  Lungs:  clear to auscultation bilaterally, no  wheezing, rhonchi or rales  Abd: soft, nontender, no hepatomegaly  Ext: no edema Musculoskeletal:  No deformities, BUE and BLE strength normal and equal Skin: warm and dry  Neuro:  CNs 2-12 intact, no focal abnormalities noted Psych:  Normal affect   Bradley:  The Bradley was personally reviewed and demonstrates:  Atrial fibrillation with SVR Telemetry:  Telemetry was personally reviewed and demonstrates:  Atrial fibrillation rates in the upper 30-40s  Relevant CV Studies:  Cath: 02/2017  Prox RCA to Mid RCA lesion, 0 %stenosed. Mid RCA lesion, 30 %stenosed. Dist RCA lesion, 70 %stenosed. Ost Cx to Prox Cx lesion, 100 %stenosed. Dist LAD lesion, 100 %stenosed. Mid LAD lesion, 50 %stenosed. Ost LAD to Prox LAD lesion, 60 %stenosed. Ramus-2 lesion, 70 %stenosed. A STENT SYNERGY DES 3X32 drug eluting stent was successfully placed. Ramus-1 lesion, 80 %stenosed. Post intervention, there is a 0% residual stenosis. A STENT SYNERGY DES 3X24 drug eluting stent was successfully placed, and overlaps previously placed stent. Ost Ramus lesion, 95 %stenosed. Post intervention, there is a 0% residual stenosis.   Successful complex PCI to the ramus intermediate vessel which had a 95% ostial stenosis followed by diffuse 80% proximal stenosis and 70% mid stenosis.  The three lesion were treated with high-speed rotational atherectomy with a 1.5 mm and a 1.75 mm burr,  cutting balloon atherotomy with a 3.010 mm Wolverine balloon, and tandem DES stenting with a 3.032 mm and 3.024 mm Synergy DES stents postdilated to 3.28 m millimeters ostially to 3.21 mm distally with the entire segments being reduced to 0%.     RECOMMENDATION: Lifelong DAPT.  Continue aggressive medical therapy for the patient's severe concomitant CAD.  Diagnostic Dominance: Right  Intervention     Echo: 12/2015  Study Conclusions   - Left ventricle: The cavity size was normal. Wall thickness was    increased in a pattern of moderate LVH. There was focal basal    hypertrophy. Systolic function was normal. The estimated ejection    fraction was in the range of 60% to 65%. Wall motion was normal;    there were no regional wall motion abnormalities. Doppler    parameters are consistent with abnormal left ventricular    relaxation (grade 1 diastolic dysfunction).  - Aortic valve: There was mild regurgitation.  - Left atrium: The atrium was moderately dilated.   Laboratory Data:  High Sensitivity Troponin:  No results for input(s): "TROPONINIHS" in the last 720 hours.   ChemistryNo results for input(s): "NA", "K", "CL", "CO2", "GLUCOSE", "BUN", "CREATININE", "CALCIUM", "MG", "GFRNONAA", "GFRAA", "ANIONGAP" in the last 168 hours.  No results for input(s): "PROT", "ALBUMIN", "AST", "ALT", "ALKPHOS", "BILITOT" in the last 168 hours. Lipids No results for input(s): "CHOL", "TRIG", "HDL", "LABVLDL", "LDLCALC", "CHOLHDL" in the last 168 hours.  HematologyNo results for input(s): "WBC", "RBC", "HGB", "HCT", "MCV", "MCH", "MCHC", "RDW", "PLT" in the last 168 hours. Thyroid No results for input(s): "TSH", "FREET4" in the last 168 hours.  BNPNo results for input(s): "BNP", "PROBNP" in the last 168 hours.  DDimer No results for input(s): "DDIMER" in the last 168 hours.   Radiology/Studies:  DG Pelvis Portable  Result Date: 11/13/2022 CLINICAL DATA:  Status post left hip replacement EXAM: PORTABLE PELVIS 1-2 VIEWS COMPARISON:  Right hip x-ray 08/01/2022. FINDINGS: There is a new left hip arthroplasty in anatomic alignment. There is lateral soft tissue swelling and skin staples compatible with recent surgery. There is no acute  fracture. There are moderate degenerative changes of the right  hip, unchanged. Peripheral vascular calcifications are present. IMPRESSION: New left hip arthroplasty in anatomic alignment. Electronically Signed   By: Ronney Asters M.D.   On: 11/13/2022 16:31   DG HIP UNILAT WITH PELVIS 1V LEFT  Result Date: 11/13/2022 CLINICAL DATA:  Total left hip arthroplasty. EXAM: DG HIP (WITH OR WITHOUT PELVIS) 1V*L* COMPARISON:  Radiographs 12/17/2020 FINDINGS: Well seated components of a total left hip arthroplasty without complicating features. IMPRESSION: Well seated components of a total left hip arthroplasty. Electronically Signed   By: Marijo Sanes M.D.   On: 11/13/2022 16:03   DG C-Arm 1-60 Min-No Report  Result Date: 11/13/2022 Fluoroscopy was utilized by the requesting physician.  No radiographic interpretation.     Assessment and Plan:   [name] is a 89 y.o. male with a hx of CAD s/p DES to RCA '17, DES to RI '18, carotid artery disease s/p L CEA, HTN, HLD, CVA, paroxsymal Afib (not on long term [place] 2/2 falls) who is being seen 11/13/2022 for the evaluation of bradycardia at the request of Dr. Ninfa Linden.  Atrial fibrillation slow Ventricular response -- does have hx of the same, last Bradley in the office showed atrial fib with rate of 54 bpm. Has not bee on [place] in the past 2/2 hx of freq falls. HR are on the slower side while in PACU but is hemodynamically stable with systolic BP of [id number].  -- will need telemetry overnight to follow rates -- no AV agents PTA -- as above, no plans for long term [place]  Left hip OA S/p Left THR -- per ortho   CAD s/p DES to RCA'17, RI' 18 -- no anginal symptoms, has been on long term DAPT. Need to resume once ok with ortho  HTN -- blood pressures stable while in PACU    Risk Assessment/Risk Scores:   CHA2DS2-VASc Score = 6   This indicates a 9.7% annual risk of stroke. The patient's score is based upon: CHF History: 0 HTN History: 1 Diabetes History:  0 Stroke History: 2 Vascular Disease History: 1 Age Score: 2 Gender Score: 0   For questions or updates, please contact [place] Please consult [email or web address] for contact info under    Signed, Reino Bellis, NP  11/13/2022 5:26 PM  Patient seen and examined with Reino Bellis, NP.  Agree as above, with the following exceptions and changes as noted below.  Patient is a 89 year old male currently underwent left hip replacement under spinal anesthesia. Gen: NAD, CV: RRR, no murmurs, Lungs: clear, Abd: soft, Extrem: Warm, well perfused, no edema, Neuro/Psych: alert and oriented x 3, normal mood and affect. All available labs, radiology testing, previous records reviewed.  Patient is noted to have atrial fibrillation with slow ventricular response.  He has had this documented in his history previously.  He received glycopyrrolate per anesthesia Dr. Lissa Hoard, with some improvement in heart rates.  Rhythm is atrial fibrillation, otherwise he is hemodynamically stable.  Recommend observation overnight on telemetry for any recurrence of significant bradycardia.  Hold AV nodal blocking agents.  As above and per history review, anticoagulation for atrial fibrillation was deferred by primary cardiologist in the setting of frequent falls.  Elouise Munroe, MD 11/13/22 5:57 PM

## 2022-11-13 NOTE — Progress Notes (Signed)
EKG done at this time

## 2022-11-13 NOTE — Op Note (Signed)
Operative Note  Date of operation: 11/13/2022 Preoperative diagnosis: Left hip primary osteoarthritis Postoperative diagnosis: Same  Procedures: Left direct anterior total hip arthroplasty  Implants: Implant Name Type Inv. Item Serial No. Manufacturer Lot No. LRB No. Used Action  PIN SECTOR W/GRIP ACE CUP 52MM - [id number] Hips PIN SECTOR W/GRIP ACE CUP 52MM  [employer]PU[employer] [id number] Left 1 Implanted  LINER NEUTRAL 52X36MM PLUS 4 - [id number] Liner LINER NEUTRAL 52X36MM PLUS 4  DEPUY ORTHOPA[employer] [id number] Left 1 Implanted  FEM STEM 12/14 TAPER SZ 4 HIP - [id number] Orthopedic Implant FEM STEM 12/14 TAPER SZ 4 HIP  DEPUY ORTH[employer] [id number] Left 1 Implanted  SROM M HEAD 36MM PLUS 1.5 - [id number] Hips SROM M HEAD 36MM PLUS 1.5  DEPUY ORTH[employer] [id number] Left 1 Implanted    Surgeon: Lind Guest. Ninfa Linden, MD  Anesthesia: Spinal Antibiotics: IV Ancef EBL: [id number] cc Complications: None  Indications: The patient is a 89 year old gentleman with severe end-stage arthritis of his left hip this been well-documented.  His left hip pain is severe and it is daily.  It is detrimentally affecting his mobility, his quality of life and his actives daily living to the point he wishes proceed with total hip arthroplasty and his family is behind him with this request as well given the amount of pain he is in.  He has been seen by his cardiologist and cleared for the surgery.  We had a long thorough discussion about the heightened risk of acute blood loss anemia, neurovascular injury, fracture, infection, dislocation, DVT, implant failure, leg length differences and wound healing issues.  He understands her goals are hopefully decrease pain, improve mobility and overall improve quality of life.  Procedure description: After informed consent was obtained and the appropriate left hip was marked, the patient was brought to the operating room and set up on the stretcher where spinal anesthesia was obtained.   He was then laid in supine position on stretcher and a Foley catheter was placed.  Traction boots were placed on both his feet and he was placed supine on the Hana fracture table with a perineal post in place in both legs and inline skeletal traction devices no traction applied.  Of note his preoperative leg length supine shows he is much shorter on the left side than the right.  His x-rays make him look like he is equal but we know we need to lengthen him for stability and leg length purposes.  We assessed his left hip again radiographically under fluoroscopic guidance.  The left hip was then prepped and draped with DuraPrep and sterile drapes.  A timeout was called and he was identified as the correct patient and the correct left hip.  An incision was then made just inferior and posterior to the ASIS and carried slightly obliquely down the leg.  Dissection was carried down to the tensor fascia lata muscle and the tensor fascia was then divided longitudinally to proceed with a direct interposed the hip.  Circumflex vessels were identified and cauterized and the hip capsule was identified and opened up in L-type format finding a very large joint effusion.  This was consistent with severe end-stage arthritis.  Cobra retractors were placed around the medial and lateral femoral neck and a femoral neck cut was made with an oscillating saw just proximal to the lesser trochanter and completed with an osteotome.  A corkscrew guide was placed in the femoral head and the femoral head was removed in its entirety and found to be flattened  and devoid of cartilage.  A bent Hohmann was then placed over the medial acetabular rim and remnants of the acetabular labrum and other debris removed.  Reaming was then initiated under direct visualization as well as direct fluoroscopy from a size 43 reamer and stepwise increments going to a size 51 reamer with again all reamers placed under direct visualization and the last replaced under  direct fluoroscopy in order to obtain the depth of reaming, the inclination and the anteversion.  The real DePuy Sectra GRIPTION acetabular component size 52 was then placed without difficulty followed by a 36+4 polythene liner.  Attention was then turned to the femur.  With the left leg externally rotated to 120 degrees, extended and adducted, a Mueller retractor was placed medially and a Hohmann retractor behind the greater trochanter.  A box cutting osteotome was used in the femoral canal.  The lateral capsule was released.  We then began broaching using the Actis broaching system from a size 0 going to a size 4.  With a size 4 in place we trialed a standard offset femoral neck and a 36+1.5 hip ball.  We brought the leg back over and up and with traction and internal rotation reduced in the pelvis.  We felt like we needed just a little bit more offset and leg length.  We dislocated the hip and remove the trial components.  We placed the real Actis femoral component with high offset size 4 and went with the real 36+1.5 metal hip ball.  The leg was brought over and up and with traction and internal rotation reduced in the pelvis.  We are pleased with offset, range of motion, leg length and stability assessed both radiographically and mechanically.  The soft tissue was then irrigated normal saline solution.  The joint capsule was closed with interrupted #1 Ethibond suture followed by a #1 Vicryl to close the tensor fascia.  0 Vicryl was used to close the deep tissue and 2-0 Vicryl was used to close the subcutaneous tissue.  The skin was reapproximated with staples.  An Aquacel dressing was applied.  The patient was taken off the Hana table and taken to recovery in stable addition.

## 2022-11-13 NOTE — Anesthesia Postprocedure Evaluation (Signed)
Anesthesia Post Note  Patient: [name]  Procedure(s) Performed: LEFT TOTAL HIP ARTHROPLASTY ANTERIOR APPROACH (Left: Hip)     Patient location during evaluation: PACU Anesthesia Type: Spinal Level of consciousness: awake and alert Pain management: pain level controlled Vital Signs Assessment: post-procedure vital signs reviewed and stable Respiratory status: spontaneous breathing Cardiovascular status: stable Anesthetic complications: no Comments: Profound bradycardia in PACU. HR in high 30's when I saw him initially. We weaned off phenylephrine, BP stable, mentation normal (AAOx3). Requested tele bed. Called Dr. Ninfa Linden to recommend cardiology consult during admission for possible pacemaker placement. At that time his HR dropped to low 30's, had a couple of ~3 second pauses. EKG read HR as low as 29. I called Dr. Ninfa Linden back and recommended we have cardiology see him ASAP. Dr. Margaretann Loveless presented at bedside very shortly thereafter.    No notable events documented.  Last Vitals:  Vitals:   11/13/22 1630 11/13/22 1645  BP: 131/67 127/63  Pulse: (!) 41 (!) 32  Resp: 16 18  Temp:    SpO2: 90% 95%    Last Pain:  Vitals:   11/13/22 1645  PainSc: 0-No pain    LLE Motor Response: No movement due to regional block (11/13/22 1645)   RLE Motor Response: No movement due to regional block (11/13/22 1645)   L Sensory Level: L1-Inguinal (groin) region (11/13/22 1645) R Sensory Level: L1-Inguinal (groin) region (11/13/22 1645)  Nolon Nations

## 2022-11-14 ENCOUNTER — Encounter (HOSPITAL_COMMUNITY): Payer: Self-pay | Admitting: Orthopaedic Surgery

## 2022-11-14 DIAGNOSIS — Z955 Presence of coronary angioplasty implant and graft: Secondary | ICD-10-CM | POA: Diagnosis not present

## 2022-11-14 DIAGNOSIS — I252 Old myocardial infarction: Secondary | ICD-10-CM | POA: Diagnosis not present

## 2022-11-14 DIAGNOSIS — E039 Hypothyroidism, unspecified: Secondary | ICD-10-CM | POA: Diagnosis present

## 2022-11-14 DIAGNOSIS — Z800 Family history of malignant neoplasm of digestive organs: Secondary | ICD-10-CM | POA: Diagnosis not present

## 2022-11-14 DIAGNOSIS — R001 Bradycardia, unspecified: Secondary | ICD-10-CM | POA: Diagnosis not present

## 2022-11-14 DIAGNOSIS — I480 Paroxysmal atrial fibrillation: Secondary | ICD-10-CM | POA: Diagnosis present

## 2022-11-14 DIAGNOSIS — M1612 Unilateral primary osteoarthritis, left hip: Secondary | ICD-10-CM | POA: Diagnosis present

## 2022-11-14 DIAGNOSIS — Z79899 Other long term (current) drug therapy: Secondary | ICD-10-CM | POA: Diagnosis not present

## 2022-11-14 DIAGNOSIS — E785 Hyperlipidemia, unspecified: Secondary | ICD-10-CM | POA: Diagnosis present

## 2022-11-14 DIAGNOSIS — D631 Anemia in chronic kidney disease: Secondary | ICD-10-CM | POA: Diagnosis present

## 2022-11-14 DIAGNOSIS — Z951 Presence of aortocoronary bypass graft: Secondary | ICD-10-CM | POA: Diagnosis not present

## 2022-11-14 DIAGNOSIS — K219 Gastro-esophageal reflux disease without esophagitis: Secondary | ICD-10-CM | POA: Diagnosis present

## 2022-11-14 DIAGNOSIS — Z79890 Hormone replacement therapy (postmenopausal): Secondary | ICD-10-CM | POA: Diagnosis not present

## 2022-11-14 DIAGNOSIS — Z833 Family history of diabetes mellitus: Secondary | ICD-10-CM | POA: Diagnosis not present

## 2022-11-14 DIAGNOSIS — I2510 Atherosclerotic heart disease of native coronary artery without angina pectoris: Secondary | ICD-10-CM | POA: Diagnosis present

## 2022-11-14 DIAGNOSIS — Z8616 Personal history of COVID-19: Secondary | ICD-10-CM | POA: Diagnosis not present

## 2022-11-14 DIAGNOSIS — Z888 Allergy status to other drugs, medicaments and biological substances status: Secondary | ICD-10-CM | POA: Diagnosis not present

## 2022-11-14 DIAGNOSIS — Z7902 Long term (current) use of antithrombotics/antiplatelets: Secondary | ICD-10-CM | POA: Diagnosis not present

## 2022-11-14 DIAGNOSIS — Z8673 Personal history of transient ischemic attack (TIA), and cerebral infarction without residual deficits: Secondary | ICD-10-CM | POA: Diagnosis not present

## 2022-11-14 DIAGNOSIS — Z7982 Long term (current) use of aspirin: Secondary | ICD-10-CM | POA: Diagnosis not present

## 2022-11-14 DIAGNOSIS — Z8249 Family history of ischemic heart disease and other diseases of the circulatory system: Secondary | ICD-10-CM | POA: Diagnosis not present

## 2022-11-14 DIAGNOSIS — I129 Hypertensive chronic kidney disease with stage 1 through stage 4 chronic kidney disease, or unspecified chronic kidney disease: Secondary | ICD-10-CM | POA: Diagnosis present

## 2022-11-14 LAB — CBC
HCT: 30.1 % — ABNORMAL LOW (ref 39.0–52.0)
Hemoglobin: 9.8 g/dL — ABNORMAL LOW (ref 13.0–17.0)
MCH: 32 pg (ref 26.0–34.0)
MCHC: 32.6 g/dL (ref 30.0–36.0)
MCV: 98.4 fL (ref 80.0–100.0)
Platelets: 170 10*3/uL (ref 150–400)
RBC: 3.06 MIL/uL — ABNORMAL LOW (ref 4.22–5.81)
RDW: 14.3 % (ref 11.5–15.5)
WBC: 8.7 10*3/uL (ref 4.0–10.5)
nRBC: 0 % (ref 0.0–0.2)

## 2022-11-14 LAB — BASIC METABOLIC PANEL
Anion gap: 10 (ref 5–15)
BUN: 27 mg/dL — ABNORMAL HIGH (ref 8–23)
CO2: 19 mmol/L — ABNORMAL LOW (ref 22–32)
Calcium: 8.2 mg/dL — ABNORMAL LOW (ref 8.9–10.3)
Chloride: 108 mmol/L (ref 98–111)
Creatinine, Ser: 1.1 mg/dL (ref 0.61–1.24)
GFR, Estimated: >60 mL/min (ref >60)
Glucose, Bld: 139 mg/dL — ABNORMAL HIGH (ref 70–99)
Potassium: 4.1 mmol/L (ref 3.5–5.1)
Sodium: 137 mmol/L (ref 135–145)

## 2022-11-14 MED ORDER — CHLORHEXIDINE GLUCONATE CLOTH 2 % EX PADS
6.0000 | MEDICATED_PAD | Freq: Every day | CUTANEOUS | Status: DC
  Administered 2022-11-13 – 2022-11-17 (×5): 6 via TOPICAL

## 2022-11-14 NOTE — Progress Notes (Signed)
Subjective: 1 Day Post-Op Procedure(s) (LRB): LEFT TOTAL HIP ARTHROPLASTY ANTERIOR APPROACH (Left) Patient reports pain as moderate.  Appreciate Cardiology seeing the patient as a consult post-op given his bradycardia.  He is sitting up this morning in bed eating breakfast.  Follows commands appropriately, but some difficulty articulating.  This could be his baseline and could be related to medications.  Again, he follows commands appropriately.  He does report left hip postoperative pain.  He is on telemetry given his bradycardia.  He does have acute on chronic anemia but this is stable.  Objective: Vital signs in last 24 hours: Temp:  [97.2 F (36.2 C)-98.1 F (36.7 C)] 98.1 F (36.7 C) (03/27 0446) Pulse Rate:  [32-71] 71 (03/27 0446) Resp:  [14-26] 16 (03/27 0446) BP: (85-171)/(47-87) 163/69 (03/27 0446) SpO2:  [90 %-100 %] 96 % (03/27 0446) Weight:  [67.1 kg] 67.1 kg (03/26 1232)  Intake/Output from previous day: 03/26 0701 - 03/27 0700 In: 621.7 [I.V.:531.4; IV Piggyback:90.3] Out: 950 [Urine:750; Blood:200] Intake/Output this shift: No intake/output data recorded.  Recent Labs    11/14/22 0015  HGB 9.8*   Recent Labs    11/14/22 0015  WBC 8.7  RBC 3.06*  HCT 30.1*  PLT 170   Recent Labs    11/14/22 0015  NA 137  K 4.1  CL 108  CO2 19*  BUN 27*  CREATININE 1.10  GLUCOSE 139*  CALCIUM 8.2*   No results for input(s): "LABPT", "INR" in the last 72 hours.  Sensation intact distally Intact pulses distally Dorsiflexion/Plantar flexion intact Incision: scant drainage   Assessment/Plan: 1 Day Post-Op Procedure(s) (LRB): LEFT TOTAL HIP ARTHROPLASTY ANTERIOR APPROACH (Left) Up with therapy with working on his mobility.  I suspect he will be quite weak given his advanced age.  His disposition will be pending cardiac clearance combined with clearance by therapy.      [name] 11/14/2022, 7:48 AM

## 2022-11-14 NOTE — Progress Notes (Addendum)
Left VM with Son, Belenda Cruise, to return call regarding home health needs and observation status.

## 2022-11-14 NOTE — Discharge Instructions (Signed)

## 2022-11-14 NOTE — Evaluation (Signed)
Physical Therapy Evaluation Patient Details Name: [name] MRN: [id number] DOB: July 08, 1928 Today's Date: 11/14/2022  History of Present Illness  Pt is a 89 y.o. male admitted 11/13/2022 for L total hip arthroplasty (anterior approach). Post op complicated by bradycardia. PMH: CAD s/p DES to RCA '17, DES to RI '18, carotid artery disease s/p L CEA, HTN, HLD, CVA, paroxsymal Afib (not on long term OAC 2/2 falls), CKD, gout  Clinical Impression  Per phone conversation with daughter, pt was living with his daughter and nephew in a multistory home with 14 steps to bed and bath. Pt was independent with ambulation with standard walker, taking sponge baths and family providing for iADLs. Pt is currently limited in safe mobility by decreased cognition and increased L hip surgical site pain. Given pt cognition PT recommending pt return to the familiar surrounding of his home and work with HHPT at discharge, Pt's wife utilized rented hospital bed being placed in the dining room on the first level. PT recommending this be done for pt's rehabilitation until he can safely climb stairs to his bed and bathroom. PT will continue to follow pt acutely.     Recommendations for follow up therapy are one component of a multi-disciplinary discharge planning process, led by the attending physician.  Recommendations may be updated based on patient status, additional functional criteria and insurance authorization.     Assistance Recommended at Discharge Frequent or constant Supervision/Assistance  Patient can return home with the following  A little help with walking and/or transfers;A little help with bathing/dressing/bathroom;Assistance with cooking/housework;Direct supervision/assist for medications management;Direct supervision/assist for financial management;Assist for transportation;Help with stairs or ramp for entrance    Equipment Recommendations Rolling walker (2 wheels);BSC/3in1;Wheelchair (measurements  PT);Wheelchair cushion (measurements PT);Hospital bed     Functional Status Assessment Patient has had a recent decline in their functional status and demonstrates the ability to make significant improvements in function in a reasonable and predictable amount of time.     Precautions / Restrictions Precautions Precautions: Fall;Other (comment) Precaution Comments: watch HR Restrictions Weight Bearing Restrictions: Yes LLE Weight Bearing: Weight bearing as tolerated      Mobility  Bed Mobility Overal bed mobility: Needs Assistance Bed Mobility: Supine to Sit, Sit to Supine     Supine to sit: Max assist, +2 for physical assistance, +2 for safety/equipment, HOB elevated Sit to supine: Total assist, +2 for physical assistance, +2 for safety/equipment   General bed mobility comments: max multimodal cues for sequencing w/ pt able to assist some in bringing LEs to EOB and reaching to bedrail when cued. Difficulty initiating back to bed with Total A x 2 to return to supine    Transfers Overall transfer level: Needs assistance Equipment used: Rolling walker (2 wheels) Transfers: Sit to/from Stand Sit to Stand: Max assist, +2 physical assistance, +2 safety/equipment           General transfer comment: Cues for hand placement, assist to lift and tuck bottom. Max cues and RW assist for side stepping towards HOB, shakiness          Balance Overall balance assessment: Needs assistance Sitting-balance support: No upper extremity supported, Feet supported Sitting balance-Leahy Scale: Fair     Standing balance support: Bilateral upper extremity supported, During functional activity Standing balance-Leahy Scale: Poor                               Pertinent Vitals/Pain Pain Assessment Pain Assessment:  Faces Faces Pain Scale: Hurts little more Pain Location: L hip Pain Descriptors / Indicators: Grimacing, Guarding, Sore Pain Intervention(s): Monitored during  session, Repositioned, Premedicated before session    Home Living Family/patient expects to be discharged to:: Private residence Living Arrangements: Children;Other relatives (daughter, nancy and nephew) Available Help at Discharge: Family Type of Home: House Home Access: Stairs to enter Entrance Stairs-Rails: Psychiatric nurse of Steps: 4-5 Alternate Level Stairs-Number of Steps: 7 steps + 7 steps Home Layout: [facility] bed;Shower seat;BSC/3in1;Standard Environmental consultant Additional Comments: lives in a multi level duplex w/ pt bedroom typically on second floor. in the past, pt has stayed on first floor in living room w/ hospital bed. another daughter, son and nephew live nearby and can assist. Izora Gala plans to take at least one week off to be with pt at home    Prior Function Prior Level of Function : Patient poor historian/Family not available             Mobility Comments: using standard walker for mobility, no recent falls, typically able to manage stairs though slowly ADLs Comments: sponge bathing mostly at baseline, reports able to dress self     Hand Dominance   Dominant Hand: Right    Extremity/Trunk Assessment   Upper Extremity Assessment Upper Extremity Assessment: Defer to OT evaluation    Lower Extremity Assessment Lower Extremity Assessment: Generalized weakness;LLE deficits/detail LLE Deficits / Details: L hip ROM limited by surgical site pain,    Cervical / Trunk Assessment Cervical / Trunk Assessment: Kyphotic  Communication   Communication: Expressive difficulties;Other (comment) (delayed responses)  Cognition Arousal/Alertness: Awake/alert Behavior During Therapy: WFL for tasks assessed/performed, Flat affect Overall Cognitive Status: Impaired/Different from baseline Area of Impairment: Orientation, Attention, Memory, Following commands, Awareness, Safety/judgement, Problem solving                 Orientation  Level: Disoriented to, Situation Current Attention Level: Sustained Memory: Decreased short-term memory Following Commands: Follows one step commands with increased time Safety/Judgement: Decreased awareness of safety, Decreased awareness of deficits Awareness: Intellectual Problem Solving: Slow processing, Decreased initiation, Difficulty sequencing, Requires verbal cues, Requires tactile cues General Comments: Per daughter, pt with some memory deficits they attribute to old age, slower to get started in the morning. today, pt cognition a bit more impaired w/ slower processing, slower response time and increased memory deficits (was not able to recall who he lived w/ and needed choices to determine what part of the body he had sx on)        General Comments General comments (skin integrity, edema, etc.): HR-60-70s        Assessment/Plan    PT Assessment Patient needs continued PT services  PT Problem List Decreased strength;Decreased range of motion;Decreased activity tolerance;Decreased balance;Decreased mobility;Decreased cognition;Decreased coordination;Decreased safety awareness;Pain       PT Treatment Interventions DME instruction;Gait training;Stair training;Functional mobility training;Therapeutic activities;Therapeutic exercise;Balance training;Cognitive remediation;Patient/family education    PT Goals (Current goals can be found in the Care Plan section)  Acute Rehab PT Goals Patient Stated Goal: none stated PT Goal Formulation: With patient Time For Goal Achievement: 11/28/22 Potential to Achieve Goals: Good    Frequency BID     Co-evaluation PT/OT/SLP Co-Evaluation/Treatment: Yes Reason for Co-Treatment: For patient/therapist safety;To address functional/ADL transfers;Necessary to address cognition/behavior during functional activity   OT goals addressed during session: ADL's and self-care       AM-PAC PT "6 Clicks" Mobility  Outcome Measure Help needed  turning from your  back to your side while in a flat bed without using bedrails?: Total Help needed moving from lying on your back to sitting on the side of a flat bed without using bedrails?: Total Help needed moving to and from a bed to a chair (including a wheelchair)?: Total Help needed standing up from a chair using your arms (e.g., wheelchair or bedside chair)?: Total Help needed to walk in hospital room?: Total Help needed climbing 3-5 steps with a railing? : Total 6 Click Score: 6    End of Session Equipment Utilized During Treatment: Gait belt Activity Tolerance: Patient limited by pain Patient left: in bed;with call bell/phone within reach;with family/visitor present Nurse Communication: Mobility status PT Visit Diagnosis: Unsteadiness on feet (R26.81);Other abnormalities of gait and mobility (R26.89);Muscle weakness (generalized) (M62.81);Difficulty in walking, not elsewhere classified (R26.2);Pain Pain - Right/Left: Left Pain - part of body: Hip    Time: [id number] PT Time Calculation (min) (ACUTE ONLY): 20 min   Charges:   PT Evaluation $PT Eval Moderate Complexity: 1 Mod          Tavio Biegel B. Migdalia Dk PT, DPT Acute Rehabilitation Services Please use secure chat or  Call Office [phone]   [place] 11/14/2022, 11:24 AM

## 2022-11-14 NOTE — Progress Notes (Signed)
Patient transferring to [facility]. Report called prior to Tyrone Schimke RN. Transported via bed to new room. Daughter at bedside during transfer. Bedside handoff completed with Minette Brine RN at this time. All belongings transferred with patient.

## 2022-11-14 NOTE — Evaluation (Signed)
 Occupational Therapy Evaluation Patient Details Name: [name] MRN: [id number] DOB: April 16, 1928 Today's Date: 11/14/2022   History of Present Illness Pt is a 89 y.o. male admitted 11/13/2022 for L total hip arthroplasty (anterior approach). Post op complicated by bradycardia. PMH: CAD s/p DES to RCA '17, DES to RI '18, carotid artery disease s/p L CEA, HTN, HLD, CVA, paroxsymal Afib (not on long term [place] 2/2 falls), CKD, gout   Clinical Impression   PTA, pt lives with family in a multi level duplex, typically ambulatory with a standard walker and able to complete most ADLs without assistance. Pt presents now w/ deficits in cognition (delayed processing), strength, L hip pain and standing balance. Pt currently requires Max-Total A x 2 for bed mobility and transfers with RW. Pt requires up Mod-Total A x 2 for ADLs d/t deficits. PT/OT contacted daughter after session to discuss PLOF, home setup and assist at home with family planning to arrange 24/7 assist at DC. Feel pt cognition will greatly improve once in familiar environment as long as family able to assist.      Recommendations for follow up therapy are one component of a multi-disciplinary discharge planning process, led by the attending physician.  Recommendations may be updated based on patient status, additional functional criteria and insurance authorization.   Assistance Recommended at Discharge Frequent or constant Supervision/Assistance  Patient can return home with the following Two people to help with walking and/or transfers;Two people to help with bathing/dressing/bathroom    Functional Status Assessment  Patient has had a recent decline in their functional status and demonstrates the ability to make significant improvements in function in a reasonable and predictable amount of time.  Equipment Recommendations  Hospital bed;Wheelchair cushion (measurements OT);Wheelchair (measurements OT);BSC/3in1;Other (comment) (RW)     Recommendations for Other Services       Precautions / Restrictions Precautions Precautions: Fall;Other (comment) Precaution Comments: watch HR Restrictions Weight Bearing Restrictions: Yes LLE Weight Bearing: Weight bearing as tolerated      Mobility Bed Mobility Overal bed mobility: Needs Assistance Bed Mobility: Supine to Sit, Sit to Supine     Supine to sit: Max assist, +2 for physical assistance, +2 for safety/equipment, HOB elevated Sit to supine: Total assist, +2 for physical assistance, +2 for safety/equipment   General bed mobility comments: max multimodal cues for sequencing w/ pt able to assist some in bringing LEs to EOB and reaching to bedrail when cued. Difficulty initiating back to bed with Total A x 2 to return to supine    Transfers Overall transfer level: Needs assistance Equipment used: Rolling walker (2 wheels) Transfers: Sit to/from Stand Sit to Stand: Max assist, +2 physical assistance, +2 safety/equipment           General transfer comment: Cues for hand placement, assist to lift and tuck bottom. Max cues and RW assist for side stepping towards HOB, shakiness      Balance Overall balance assessment: Needs assistance Sitting-balance support: No upper extremity supported, Feet supported Sitting balance-Leahy Scale: Fair     Standing balance support: Bilateral upper extremity supported, During functional activity Standing balance-Leahy Scale: Poor                             ADL either performed or assessed with clinical judgement   ADL Overall ADL's : Needs assistance/impaired Eating/Feeding: Set up   Grooming: Minimal assistance;Sitting   Upper Body Bathing: Moderate assistance;Sitting   Lower Body Bathing: +  2 for safety/equipment;+2 for physical assistance;Total assistance;Sit to/from stand   Upper Body Dressing : Moderate assistance;Sitting   Lower Body Dressing: Total assistance;+2 for physical assistance;+2 for  safety/equipment;Sit to/from stand       Toileting- Water quality scientist and Hygiene: Total assistance;Bed level         General ADL Comments: Limited by impaired cognition and L hip soreness, as well as shakiness in standing     Vision Baseline Vision/History: 1 Wears glasses Ability to See in Adequate Light: 1 Impaired Patient Visual Report: No change from baseline Vision Assessment?: No apparent visual deficits     Perception     Praxis      Pertinent Vitals/Pain Pain Assessment Pain Assessment: Faces Faces Pain Scale: Hurts little more Pain Location: L hip Pain Descriptors / Indicators: Grimacing, Guarding, Sore Pain Intervention(s): Monitored during session, Premedicated before session     Hand Dominance Right   Extremity/Trunk Assessment Upper Extremity Assessment Upper Extremity Assessment: Generalized weakness   Lower Extremity Assessment Lower Extremity Assessment: Defer to PT evaluation   Cervical / Trunk Assessment Cervical / Trunk Assessment: Kyphotic   Communication Communication Communication: Expressive difficulties;Other (comment) (delayed responses)   Cognition Arousal/Alertness: Awake/alert Behavior During Therapy: WFL for tasks assessed/performed, Flat affect Overall Cognitive Status: Impaired/Different from baseline Area of Impairment: Orientation, Attention, Memory, Following commands, Awareness, Safety/judgement, Problem solving                 Orientation Level: Disoriented to, Situation Current Attention Level: Sustained Memory: Decreased short-term memory Following Commands: Follows one step commands with increased time Safety/Judgement: Decreased awareness of safety, Decreased awareness of deficits Awareness: Intellectual Problem Solving: Slow processing, Decreased initiation, Difficulty sequencing, Requires verbal cues, Requires tactile cues General Comments: Per daughter, pt with some memory deficits they attribute to old  age, slower to get started in the morning. today, pt cognition a bit more impaired w/ slower processing, slower response time and increased memory deficits (was not able to recall who he lived w/ and needed choices to determine what part of the body he had sx on)     General Comments  HR 60-70s    Exercises     Shoulder Instructions      Home Living Family/patient expects to be discharged to:: Private residence Living Arrangements: Children;Other relatives (daughter, nancy and nephew) Available Help at Discharge: Family Type of Home: House Home Access: Stairs to enter Technical  of Steps: 4-5 Entrance Stairs-Rails: Right;Left Home Layout: Multi-level Alternate Level Stairs-Number of Steps: 7 steps + 7 steps   Bathroom Shower/Tub: Tub/shower unit;Sponge bathes at baseline   Biochemist, clinical: [facility] bed;Shower seat;BSC/3in1;Standard Environmental consultant   Additional Comments: lives in a multi level duplex w/ pt bedroom typically on second floor. in the past, pt has stayed on first floor in living room w/ hospital bed. another daughter, son and nephew live nearby and can assist. Izora Gala plans to take at least one week off to be with pt at home      Prior Functioning/Environment Prior Level of Function : Patient poor historian/Family not available             Mobility Comments: using standard walker for mobility, no recent falls, typically able to manage stairs though slowly ADLs Comments: sponge bathing mostly at baseline, reports able to dress self        OT Problem List: Decreased strength;Decreased activity tolerance;Impaired balance (sitting and/or standing);Decreased cognition;Decreased knowledge of use of DME or AE;Pain  OT Treatment/Interventions: Self-care/ADL training;Therapeutic exercise;Energy conservation;DME and/or AE instruction;Therapeutic activities;Patient/family education    OT Goals(Current goals can be found in the care  plan section) Acute Rehab OT Goals Patient Stated Goal: agreeable for OOB attempts OT Goal Formulation: Patient unable to participate in goal setting Time For Goal Achievement: 11/28/22 Potential to Achieve Goals: Fair ADL Goals Pt Will Perform Lower Body Dressing: with mod assist;sit to/from stand;sitting/lateral leans;with adaptive equipment Pt Will Transfer to Toilet: with min assist;bedside commode;stand pivot transfer Pt Will Perform Toileting - Clothing Manipulation and hygiene: with mod assist;sitting/lateral leans;sit to/from stand  OT Frequency: Min 2X/week    Co-evaluation PT/OT/SLP Co-Evaluation/Treatment: Yes Reason for Co-Treatment: For patient/therapist safety;To address functional/ADL transfers;Necessary to address cognition/behavior during functional activity   OT goals addressed during session: ADL's and self-care      AM-PAC OT "6 Clicks" Daily Activity     Outcome Measure Help from another person eating meals?: A Little Help from another person taking care of personal grooming?: A Little Help from another person toileting, which includes using toliet, bedpan, or urinal?: Total Help from another person bathing (including washing, rinsing, drying)?: A Lot Help from another person to put on and taking off regular upper body clothing?: A Lot Help from another person to put on and taking off regular lower body clothing?: Total 6 Click Score: 12   End of Session Equipment Utilized During Treatment: Gait belt;Rolling walker (2 wheels) Nurse Communication: Mobility status  Activity Tolerance: Patient limited by fatigue;Patient limited by pain Patient left: in bed;with call bell/phone within reach;with bed alarm set;with nursing/sitter in room  OT Visit Diagnosis: Unsteadiness on feet (R26.81);Other abnormalities of gait and mobility (R26.89);Muscle weakness (generalized) (M62.81)                Time: [id number] OT Time Calculation (min): 20 min Charges:  OT General  Charges $OT Visit: 1 Visit OT Evaluation $OT Eval Moderate Complexity: 1 Mod  Malachy Chamber, OTR/L Acute Rehab Services Office: [phone]   Layla Maw 11/14/2022, 10:54 AM

## 2022-11-14 NOTE — TOC Initial Note (Signed)
Transition of Care [facility]) - Initial/Assessment Note    Patient Details  Name: [name] MRN: [id number] Date of Birth: 09-14-1927  Transition of Care [facility]) CM/SW Contact:    Cyndi Bender, RN Phone Number: 11/14/2022, 2:03 PM  Clinical Narrative:                  Damaris Schooner to Izora Gala, daughter, regarding transition needs.  Patient lives with Izora Gala and [place].  Izora Gala is requesting hospital bed and BCS, patient has walker and wheelchair.  Izora Gala agreeable to use in house provider [employer] for DME needs.  [place] home health has been arranged prior to hospital admission.   Address, Phone number and PCP verified.  TOC will continue to follow for needs.   Need Home health PT, OT, aide orders and hospital bed.  Expected Discharge Plan: [place] Barriers to Discharge: Continued Medical Work up   Patient Goals and CMS Choice Patient states their goals for this hospitalization and ongoing recovery are:: return home with daughter and niece CMS Medicare.gov Compare Post Acute Care list provided to:: Patient Represenative (must comment) Choice offered to / list presented to : Adult Children      Expected Discharge Plan and Services   Discharge Planning Services: CM Consult Post Acute Care Choice: Home Health, Durable Medical Equipment Living arrangements for the past 2 months: Single Family Home                           HH Arranged: PT, OT, Nurse's Aide HH Agency: [place] Date [facility] Agency Contacted: 11/14/22 Time HH Agency Contacted: 23 Representative spoke with at [place]: [place] Arrangements/Services Living arrangements for the past 2 months: [place] Lives with:: Adult Children Patient language and need for interpreter reviewed:: Yes Do you feel safe going back to the place where you live?: Yes      Need for Family Participation in Patient Care: Yes (Comment) Care giver support system in place?: Yes  (comment) Current home services: DME (walker, wheelchair) Criminal Activity/Legal Involvement Pertinent to Current Situation/Hospitalization: No - Comment as needed  Activities of Daily Living Home Assistive Devices/Equipment: Walker (specify type) ADL Screening (condition at time of admission) Patient's cognitive ability adequate to safely complete daily activities?: Yes Is the patient deaf or have difficulty hearing?: Yes Does the patient have difficulty seeing, even when wearing glasses/contacts?: No Does the patient have difficulty concentrating, remembering, or making decisions?: Yes Patient able to express need for assistance with ADLs?: No Does the patient have difficulty dressing or bathing?: No Independently performs ADLs?: Yes (appropriate for developmental age) Does the patient have difficulty walking or climbing stairs?: Yes Weakness of Legs: Left Weakness of Arms/Hands: None  Permission Sought/Granted         Permission granted to share info w AGENCY: HH , DME        Emotional Assessment         Alcohol / Substance Use: Not Applicable Psych Involvement: No (comment)  Admission diagnosis:  Status post total replacement of left hip [Z96.642] S/P total hip arthroplasty [id number] Patient Active Problem List   Diagnosis Date Noted   Unilateral primary osteoarthritis, left hip 11/13/2022   Status post total replacement of left hip 11/13/2022   S/P total hip arthroplasty 11/13/2022   History of stroke 04/10/2022   Anemia due to chronic kidney disease 02/06/2022   Macular degeneration 02/06/2022   Multifocal pneumonia  12/17/2020   Pneumonia due to COVID-19 virus 12/17/2020   New onset atrial fibrillation ([place]) 12/17/2020   CKD (chronic kidney disease), stage III ([place]) 12/17/2020   Anemia in chronic kidney disease (CKD) 12/17/2020   Poor balance 10/06/2019   Laryngitis 04/17/2017   Cough 03/29/2017   Acute bronchitis 03/29/2017   Acute upper respiratory  infection 03/29/2017   Presence of drug coated stent in RCA & Ramus Intermedius. 02/28/2017   Gout flare 02/22/2017   Coronary artery disease involving native coronary artery of native heart with angina pectoris [facility])    Carotid artery disease ([place])    HLD (hyperlipidemia)    Unstable angina ([place]) 02/20/2017   Atherosclerosis of native coronary artery of native heart    Essential hypertension 01/06/2009   Second degree atrioventricular block, Mobitz type I 01/06/2009   AV block, 1st degree 01/06/2009   PCP:  [name], MD Pharmacy:   [facility] [place], [place] - [address] AT [address] [place] [address] Phone: [phone] Fax: [phone]  [facility] [place] [place] [address] Phone: [phone] Fax: [phone]     Social Determinants of Health (SDOH) Social History: SDOH Screenings   Food Insecurity: No Food Insecurity (11/13/2022)  Housing: Low Risk  (11/13/2022)  Transportation Needs: No Transportation Needs (11/13/2022)  Utilities: Not At Risk (11/13/2022)  Tobacco Use: Low Risk  (11/14/2022)   SDOH Interventions: Housing Interventions: Intervention Not Indicated   Readmission Risk Interventions     No data to display

## 2022-11-14 NOTE — Progress Notes (Addendum)
Rounding Note    Patient Name: [name] Date of Encounter: 11/14/2022  [place] Cardiologist: Jenkins Rouge, MD   Subjective   No acute overnight events. Only complaints this morning is hip pain. He denies any chest pain, shortness of breath, palpitation, or lightheadedness/ dizziness.   Inpatient Medications    Scheduled Meds:  allopurinol  100 mg Oral Daily   ascorbic acid  500 mg Oral Daily   aspirin EC  81 mg Oral Daily   cholecalciferol  1,000 Units Oral Daily   clopidogrel  75 mg Oral Daily   docusate sodium  100 mg Oral BID   ferrous sulfate  325 mg Oral BID WC   folic acid  1 mg Oral Daily   isosorbide mononitrate  90 mg Oral Daily   levothyroxine  25 mcg Oral Daily   lisinopril  20 mg Oral Daily   multivitamin with minerals  1 tablet Oral Daily   pantoprazole  40 mg Oral Daily   ranolazine  1,000 mg Oral Daily   simvastatin  20 mg Oral Daily   zinc sulfate  220 mg Oral Daily   Continuous Infusions:  sodium chloride 75 mL/hr at 11/13/22 1845   methocarbamol (ROBAXIN) IV     PRN Meds: acetaminophen, alum & mag hydroxide-simeth, diphenhydrAMINE, HYDROcodone-acetaminophen, HYDROcodone-acetaminophen, menthol-cetylpyridinium **OR** phenol, methocarbamol **OR** methocarbamol (ROBAXIN) IV, metoCLOPramide **OR** metoCLOPramide (REGLAN) injection, morphine injection, nitroGLYCERIN, ondansetron **OR** ondansetron (ZOFRAN) IV   Vital Signs    Vitals:   11/13/22 1800 11/13/22 1947 11/14/22 0000 11/14/22 0446  BP: 139/72 (!) 156/74 (!) 171/80 (!) 163/69  Pulse: (!) 45 (!) 55 (!) 56 71  Resp: 14 20 19 16   Temp: 98 F (36.7 C) 97.8 F (36.6 C) 97.6 F (36.4 C) 98.1 F (36.7 C)  TempSrc:  Oral Oral Oral  SpO2: 99% 97% 97% 96%  Weight:      Height:        Intake/Output Summary (Last 24 hours) at 11/14/2022 0749 Last data filed at 11/14/2022 0545 Gross per 24 hour  Intake 621.73 ml  Output 950 ml  Net -328.27 ml      11/13/2022   12:32 PM  11/05/2022    1:52 PM 09/18/2022   11:08 AM  Last 3 Weights  Weight (lbs) 148 lb 143 lb 9.6 oz 148 lb 4.8 oz  Weight (kg) 67.132 kg 65.137 kg 67.268 kg      Telemetry    Atrial fibrillation with rates in the 50s to 60s. Occasional PVCs. - Personally Reviewed  ECG    No new ECG tracing today. - Personally Reviewed  Physical Exam   GEN: Frail 89 year old Caucasian male in no acute distress.   Neck: No JVD. Cardiac: Irregularly irregular rhythm with normal rate. Possibly very faint murmur. No rubs or gallops.   Respiratory: Clear to auscultation bilaterally. No wheezes, rhonchi, or rales.  GI: Soft,non-distended, and non-tender.  MS: No lower extremity edema. No deformity. Skin: Warm and dry. Neuro:  No focal deficits.  Psych: Normal affect. Responds appropriately.   Labs    High Sensitivity Troponin:  No results for input(s): "TROPONINIHS" in the last 720 hours.   Chemistry Recent Labs  Lab 11/14/22 0015  NA 137  K 4.1  CL 108  CO2 19*  GLUCOSE 139*  BUN 27*  CREATININE 1.10  CALCIUM 8.2*  GFRNONAA >60  ANIONGAP 10    Lipids No results for input(s): "CHOL", "TRIG", "HDL", "LABVLDL", "LDLCALC", "CHOLHDL" in  the last 168 hours.  Hematology Recent Labs  Lab 11/14/22 0015  WBC 8.7  RBC 3.06*  HGB 9.8*  HCT 30.1*  MCV 98.4  MCH 32.0  MCHC 32.6  RDW 14.3  PLT 170   Thyroid No results for input(s): "TSH", "FREET4" in the last 168 hours.  BNPNo results for input(s): "BNP", "PROBNP" in the last 168 hours.  DDimer No results for input(s): "DDIMER" in the last 168 hours.   Radiology    DG Pelvis Portable  Result Date: 11/13/2022 CLINICAL DATA:  Status post left hip replacement EXAM: PORTABLE PELVIS 1-2 VIEWS COMPARISON:  Right hip x-ray 08/01/2022. FINDINGS: There is a new left hip arthroplasty in anatomic alignment. There is lateral soft tissue swelling and skin staples compatible with recent surgery. There is no acute fracture. There are moderate degenerative  changes of the right hip, unchanged. Peripheral vascular calcifications are present. IMPRESSION: New left hip arthroplasty in anatomic alignment. Electronically Signed   By: Ronney Asters M.D.   On: 11/13/2022 16:31   DG HIP UNILAT WITH PELVIS 1V LEFT  Result Date: 11/13/2022 CLINICAL DATA:  Total left hip arthroplasty. EXAM: DG HIP (WITH OR WITHOUT PELVIS) 1V*L* COMPARISON:  Radiographs 12/17/2020 FINDINGS: Well seated components of a total left hip arthroplasty without complicating features. IMPRESSION: Well seated components of a total left hip arthroplasty. Electronically Signed   By: Marijo Sanes M.D.   On: 11/13/2022 16:03   DG C-Arm 1-60 Min-No Report  Result Date: 11/13/2022 Fluoroscopy was utilized by the requesting physician.  No radiographic interpretation.    Cardiac Studies   Left Cardiac Catheterization 02/27/2017: Prox RCA to Mid RCA lesion, 0 %stenosed. Mid RCA lesion, 30 %stenosed. Dist RCA lesion, 70 %stenosed. Ost Cx to Prox Cx lesion, 100 %stenosed. Dist LAD lesion, 100 %stenosed. Mid LAD lesion, 50 %stenosed. Ost LAD to Prox LAD lesion, 60 %stenosed. Ramus-2 lesion, 70 %stenosed. A STENT SYNERGY DES 3X32 drug eluting stent was successfully placed. Ramus-1 lesion, 80 %stenosed. Post intervention, there is a 0% residual stenosis. A STENT SYNERGY DES 3X24 drug eluting stent was successfully placed, and overlaps previously placed stent. Ost Ramus lesion, 95 %stenosed. Post intervention, there is a 0% residual stenosis.   Successful complex PCI to the ramus intermediate vessel which had a 95% ostial stenosis followed by diffuse 80% proximal stenosis and 70% mid stenosis.  The three lesion were treated with high-speed rotational atherectomy with a 1.5 mm and a 1.75 mm burr,  cutting balloon atherotomy with a 3.010 mm Wolverine balloon, and tandem DES stenting with a 3.032 mm and 3.024 mm Synergy DES stents postdilated to 3.28 m millimeters ostially to 3.21 mm distally  with the entire segments being reduced to 0%.    Recommendation: Lifelong DAPT.  Continue aggressive medical therapy for the patient's severe concomitant CAD. _______________  Myoview 06/18/2022:   Findings are consistent with prior myocardial infarction. 2 fixed moderate defects noted consistent with LAD and RCA infarction. No ischemia. The study is low risk.   LV perfusion is abnormal. There is no evidence of ischemia. There is evidence of infarction. Defect 1: There is a small defect with moderate reduction in uptake present in the mid to basal anteroseptal location(s) that is fixed. There is normal wall motion in the defect area. Consistent with infarction. Defect 2: There is a small defect with moderate reduction in uptake present in the apical to mid inferior location(s). There is normal wall motion in the defect area. Consistent with infarction.  Left ventricular function is normal. Nuclear stress EF: 66 %. The left ventricular ejection fraction is hyperdynamic (>65%). End diastolic cavity size is mildly enlarged.   Patient Profile     89 y.o. male with a history of CAD s/p DES to RCA in 2017 and DES to ramus intermedius in 2018, paroxysmal atrial fibrillation not on anticoagulation due to falls, carotid artery disease s/p left CEA, CVA, hypertension, hyperlipidemia, and hypothyroidism who presented to [facility] on 11/13/2022 for a planned total hip arthroplasty with Dr. Rush Farmer under spinal anesthesia. He tolerated the procedure well but became bradycardic while in the PACU with heart rates in the 30s for which Cardiology was consulted.   Assessment & Plan    Atrial Fibrillation with Slow Ventricular Response Patient has a history of atrial fibrillation with slow ventricular response. Last EKG in the office showed atrial fibrillation with rate of 54 bpm. Heart rate dropped to the 30s while in PACU after hip surgery. EKG showed atrial fibrillation with ventricular rate of 35 bpm. However,  BP was stable wit this. Phenylephrine was weaned off and heart rates improved.  - Rates stable in the 50s to 65s. - Patient asymptomatic.  - Continue to avoid AV nodal agents.  - CHA2DS2-VASc = 6 (CAD, CVA x2, HTN, age x2). However, he has not been on chronic anticoagulation given history of falls.   CAD S/p DES to RCA in 2017 and DES to ramus intermedius in 2018.  - No anginal symptoms.  - Continue home Imdur 90mg  daily and Ranexa 1000mg  twice daily. - Has been on long-term DAPT with Aspirin and Plavix. Resume these when OK with Ortho.  - Continue statin.   Hypertension BP elevated this morning with BP in the 160s/90s but he has not received any medications yet. He is also having pain this morning which is likely contributing to higher BP. BP normally well controlled in the office.  - Continue home medications for now: Imdur 90mg  daily and Lisinopril 20mg  daily.  Otherwise, per primary team: - Left hip osteoarthritis s/p left total hip arthroplasty on 3/26 - Hypothyroidism   For questions or updates, please contact [place] Please consult [email or web address] for contact info under        Signed, Darreld Mclean, PA-C  11/14/2022, 7:49 AM    Patient seen and examined with CG PA-C.  Agree as above, with the following exceptions and changes as noted below.  Stable heart rates in atrial fibrillation rates in the 60s, no further bradycardia. Gen: NAD, CV: iRRR, no murmurs, Lungs: clear, Abd: soft, Extrem: Warm, well perfused, no edema, Neuro/Psych: alert and oriented x 3, normal mood and affect. All available labs, radiology testing, previous records reviewed.  Patient is stable from a cardiovascular perspective, orthopedic service plans to keep him for mobility, would recommend continued telemetry.  Okay to continue home blood pressure medications as tolerated, avoid AV nodal blocking agents.  Pending review of telemetry overnight, may want to consider cardiac monitor home-going  for burden of bradycardia.  Elouise Munroe, MD 11/14/22 11:50 AM

## 2022-11-14 NOTE — Care Management Obs Status (Signed)
[place]   Patient Details  Name: HANSFORD JEMISON MRN: [id number] Date of Birth: 02/14/1928   Medicare Observation Status Notification Given:  Yes    Cyndi Bender, RN 11/14/2022, 2:40 PM

## 2022-11-15 DIAGNOSIS — M1612 Unilateral primary osteoarthritis, left hip: Secondary | ICD-10-CM | POA: Diagnosis not present

## 2022-11-15 NOTE — Progress Notes (Signed)
Physical Therapy Treatment Patient Details Name: [name] MRN: [id number] DOB: 07/10/1928 Today's Date: 11/15/2022   History of Present Illness Pt is a 89 y.o. male admitted 11/13/2022 for L total hip arthroplasty (anterior approach). Post op complicated by bradycardia. PMH: CAD s/p DES to RCA '17, DES to RI '18, carotid artery disease s/p L CEA, HTN, HLD, CVA, paroxsymal Afib (not on long term [place] 2/2 falls), CKD, gout    PT Comments    Pt was received in supine and agreeable to session. Pt required up to max A for bed mobility due to impaired ability to follow commands and problem solve. Pt able to advance RLE well, however had difficulty with hand placement and coordinating movements despite multimodal cues. Pt able to sit EOB with intermittent min A for balance and cues for UE support. Pt able to stand from EOB to RW with max A for balance due to pt shaking once initiating standing. Pt unable to maintain standing balance for more than ~15 seconds and unable to take lateral step toward HOB. When attempting to lateral scoot at EOB, pt began shaking when therapist assisted with anterior lean. However, when pt attempted without assist, no shaking was noted and pt was able to advance towards HOB. Pt benefited from simple, general commands throughout session. Pt continues to benefit from PT services to progress toward functional mobility goals.    Recommendations for follow up therapy are one component of a multi-disciplinary discharge planning process, led by the attending physician.  Recommendations may be updated based on patient status, additional functional criteria and insurance authorization.  Follow Up Recommendations       Assistance Recommended at Discharge Frequent or constant Supervision/Assistance  Patient can return home with the following A little help with walking and/or transfers;A little help with bathing/dressing/bathroom;Assistance with cooking/housework;Direct  supervision/assist for medications management;Direct supervision/assist for financial management;Assist for transportation;Help with stairs or ramp for entrance   Equipment Recommendations  Rolling walker (2 wheels);BSC/3in1;Wheelchair (measurements PT);Wheelchair cushion (measurements PT);Hospital bed    Recommendations for Other Services       Precautions / Restrictions Precautions Precautions: Fall;Other (comment) Precaution Comments: watch HR Restrictions Weight Bearing Restrictions: Yes LLE Weight Bearing: Weight bearing as tolerated     Mobility  Bed Mobility Overal bed mobility: Needs Assistance Bed Mobility: Supine to Sit, Sit to Supine     Supine to sit: Max assist Sit to supine: Mod assist   General bed mobility comments: Dense multimodal cues for sequencing and technique. Max A for BLE management and trunk elevation to sit EOB and Mod A for hip repositioning when returning to supine. Pt able to scoot up in bed with dense cues and min A with hand placement.    Transfers Overall transfer level: Needs assistance Equipment used: Rolling walker (2 wheels) Transfers: Sit to/from Stand Sit to Stand: Max assist           General transfer comment: Dense cues for hand placement and max A for power up and steadying due to pt shaking and limited ability to reach upright posture. When attempting anterior weight shift for lateral scoot, pt with shakiness, however able to lateral scoot without shakiness when therapist not physically assisting. Pt unable to take lateral steps this session due to fatigue and lack of +2 assist.    Ambulation/Gait               General Gait Details: unable       Balance Overall balance assessment: Needs assistance  Sitting-balance support: No upper extremity supported, Feet supported Sitting balance-Leahy Scale: Poor Sitting balance - Comments: Sitting EOB with posterior lean requiring dense cues for hand placement and anterior  weight shift and intermittent min A   Standing balance support: Bilateral upper extremity supported, During functional activity Standing balance-Leahy Scale: Poor Standing balance comment: with RW support and max A to maintain balance due to shakiness                            Cognition Arousal/Alertness: Awake/alert Behavior During Therapy: WFL for tasks assessed/performed Overall Cognitive Status: Impaired/Different from baseline                                 General Comments: Pt with difficulty following commands and confusion about what he was being asked to do. When asked to move LLE pt pointed to RLE and when told to move to the L pt moved RLE to the R. Pt able to follow better with less specific commands, such as "sit on the side of the bed" vs "move your legs to the side of the bed". When completing exercises on EOB, pt lifting BLE when told to lift single LE and unable to coordinate only lifting one.        Exercises General Exercises - Lower Extremity Ankle Circles/Pumps: AROM, Supine, Both, 5 reps Long Arc Quad: AROM, Seated, Both, 5 reps Hip ABduction/ADduction: AROM, AAROM, Supine, Both, 5 reps Hip Flexion/Marching: AROM, Seated, Both, 5 reps    General Comments        Pertinent Vitals/Pain Pain Assessment Pain Assessment: Faces Faces Pain Scale: Hurts a little bit Pain Location: L hip Pain Descriptors / Indicators: Grimacing, Guarding, Sore Pain Intervention(s): Monitored during session, Limited activity within patient's tolerance, Repositioned     PT Goals (current goals can now be found in the care plan section) Acute Rehab PT Goals Patient Stated Goal: none stated PT Goal Formulation: With patient Time For Goal Achievement: 11/28/22 Potential to Achieve Goals: Good Progress towards PT goals: Progressing toward goals    Frequency    7X/week      PT Plan Current plan remains appropriate       AM-PAC PT "6 Clicks"  Mobility   Outcome Measure  Help needed turning from your back to your side while in a flat bed without using bedrails?: A Little Help needed moving from lying on your back to sitting on the side of a flat bed without using bedrails?: A Lot Help needed moving to and from a bed to a chair (including a wheelchair)?: Total Help needed standing up from a chair using your arms (e.g., wheelchair or bedside chair)?: A Lot Help needed to walk in hospital room?: Total Help needed climbing 3-5 steps with a railing? : Total 6 Click Score: 10    End of Session Equipment Utilized During Treatment: Gait belt Activity Tolerance: Patient tolerated treatment well Patient left: in bed;with call bell/phone within reach;with bed alarm set Nurse Communication: Mobility status PT Visit Diagnosis: Unsteadiness on feet (R26.81);Other abnormalities of gait and mobility (R26.89);Muscle weakness (generalized) (M62.81);Difficulty in walking, not elsewhere classified (R26.2);Pain     Time: 1021-1050 PT Time Calculation (min) (ACUTE ONLY): 29 min  Charges:  $Therapeutic Exercise: 8-22 mins $Therapeutic Activity: 8-22 mins  Michelle Nasuti, PTA Acute Rehabilitation Services Secure Chat Preferred  [phone]    Michelle Nasuti 11/15/2022, 11:34 AM

## 2022-11-15 NOTE — TOC Progression Note (Signed)
Transition of Care [facility]) - Progression Note    Patient Details  Name: [name] MRN: [id number] Date of Birth: [birth year]  [facility] CM/SW Contact  Sharin Mons, RN Phone Number: 11/15/2022, 2:57 PM  Clinical Narrative:     Referral for hospital bed and [facility] made with [employer]. Equipment will be delivered to pt's home once authorization received. Per daughter equipment needed prior to pt's transition to home. TOC team following and will assist with needs....  Expected Discharge Plan: [place] Barriers to Discharge: Continued Medical Work up  Expected Discharge Plan and Services   Discharge Planning Services: CM Consult Post Acute Care Choice: Home Health, Durable Medical Equipment Living arrangements for the past 2 months: [place]                 DME Arranged: Hospital bed, Bedside commode DME Agency: [employer] Date DME Agency Contacted: 11/15/22 Time DME Agency Contacted: [phone] Representative spoke with at DME Agency: Erasmo Downer HH Arranged: PT, OT, Nurse's Aide [place] Agency: [place] Date [place]: 11/14/22 Time [place]: 27 Representative spoke with at [place]: [place] Determinants of Health ([place]) Interventions SDOH Screenings   Food Insecurity: No Food Insecurity (11/13/2022)  Housing: Low Risk  (11/13/2022)  Transportation Needs: No Transportation Needs (11/13/2022)  Utilities: Not At Risk (11/13/2022)  Tobacco Use: Low Risk  (11/14/2022)    Readmission Risk Interventions     No data to display

## 2022-11-15 NOTE — Progress Notes (Signed)
Patient ID: [name], male   DOB: September 17, 1927, 89 y.o.   MRN: [id number] The patient is awake and alert.  Family is at the bedside.  I reviewed cardiology's notes and they are arranging for outpatient follow-up.  He is doing well from their standpoint.  From an orthopedic standpoint, he does have slow mobility.  His Foley catheter is still in place so we will put an order for nursing to remove his Foley today.  Apparently he does have a lot of support at home in terms of family and equipment.  However, we may need to keep him here until Saturday morning in order to maximize therapy and safety from a mobility standpoint before getting him home.  Will continue to follow him closely and work on discharge planning.  Since he is going to be here tonight and likely tomorrow night we will change him to an inpatient admission.

## 2022-11-15 NOTE — Progress Notes (Signed)
    Durable Medical Equipment  (From admission, onward)           Start     Ordered   11/15/22 1450  For home use only DME Bedside commode  Once       Comments: Patient confined to one room  Question:  Patient needs a bedside commode to treat with the following condition  Answer:  Status post total hip replacement, left   11/15/22 1452   11/15/22 1445  For home use only DME Hospital bed  Once       Question Answer Comment  Length of Need 6 Months   Patient has (list medical condition): recent L total hip arthroplasty,  PMH: CAD s/p DES to RCA , CAD, s/p L CEA, HTN, HLD, CVA, paroxsymal Afib , CKD, gout   The above medical condition requires: Patient requires the ability to reposition frequently   Head must be elevated greater than: 30 degrees   Bed type Semi-electric   Support Surface: Gel Overlay      11/15/22 1448   11/13/22 1830  DME 3 n 1  Once        11/13/22 1829   11/13/22 1830  DME Walker rolling  Once       Question Answer Comment  Walker: With [place] Wheels   Patient needs a walker to treat with the following condition Status post total replacement of left hip      11/13/22 1829

## 2022-11-15 NOTE — Progress Notes (Addendum)
Physical Therapy Treatment Patient Details Name: [name] MRN: [id number] DOB: [birth year] Today's Date: 11/15/2022   History of Present Illness Pt is a 89 y.o. male admitted 11/13/2022 for L total hip arthroplasty (anterior approach). Post op complicated by bradycardia. PMH: CAD s/p DES to RCA '17, DES to RI '18, carotid artery disease s/p L CEA, HTN, HLD, CVA, paroxsymal Afib (not on long term [place] 2/2 falls), CKD, gout    PT Comments    Pt was received in supine and agreeable to session. Pt continued to have difficulty following commands and coordinating movements this session requiring dense multimodal cues. Pt was able to stand and attempt steps to chair with up to max A +2 for balance due to pt shaking once standing, possible fear of falling and pain. Pt able to take one small step and then required assist to advance BLE towards chair. Once standing, pt's static standing balance improved with RW support vs 2 HHA.  Pt unable to fully extend B knees and hips in standing despite full passive ROM. Pt continues to benefit from PT services to progress toward functional mobility goals.     Recommendations for follow up therapy are one component of a multi-disciplinary discharge planning process, led by the attending physician.  Recommendations may be updated based on patient status, additional functional criteria and insurance authorization.  Follow Up Recommendations       Assistance Recommended at Discharge Frequent or constant Supervision/Assistance  Patient can return home with the following A little help with walking and/or transfers;A little help with bathing/dressing/bathroom;Assistance with cooking/housework;Direct supervision/assist for medications management;Direct supervision/assist for financial management;Assist for transportation;Help with stairs or ramp for entrance   Equipment Recommendations  Rolling walker (2 wheels);BSC/3in1;Wheelchair (measurements PT);Wheelchair cushion  (measurements PT);Hospital bed    Recommendations for Other Services       Precautions / Restrictions Precautions Precautions: Fall;Other (comment) Precaution Comments: watch HR Restrictions Weight Bearing Restrictions: Yes LLE Weight Bearing: Weight bearing as tolerated     Mobility  Bed Mobility Overal bed mobility: Needs Assistance Bed Mobility: Supine to Sit, Sit to Supine     Supine to sit: Mod assist Sit to supine: Mod assist   General bed mobility comments: Pt requiring dense cues for sequencing and assist with BLE management, hand placement, and trunk elevation.    Transfers Overall transfer level: Needs assistance Equipment used: Rolling walker (2 wheels), 2 person hand held assist Transfers: Sit to/from Stand, Bed to chair/wheelchair/BSC Sit to Stand: +2 physical assistance, Mod assist   Step pivot transfers: Max assist, +2 physical assistance       General transfer comment: Pt able to stand from EOB x1 and recliner x1 with mod A +2 for power up and upright posture due to limited knee extension. First stand performed with 2 HHA and second stand with RW. Static standing balance improved with RW support, however pt continued to require assist for hip extension and balance. Pt able to take one step towards chair, but then required assist with LE advancement.    Ambulation/Gait               General Gait Details: unable      Balance Overall balance assessment: Needs assistance Sitting-balance support: No upper extremity supported, Feet supported Sitting balance-Leahy Scale: Poor Sitting balance - Comments: sitting EOB with cues for hand placement for support and intermittent min A   Standing balance support: Bilateral upper extremity supported, During functional activity, Reliant on assistive device for balance Standing  balance-Leahy Scale: Poor Standing balance comment: with RW and 2 HHA support                            Cognition  Arousal/Alertness: Awake/alert Behavior During Therapy: WFL for tasks assessed/performed Overall Cognitive Status: Impaired/Different from baseline                                 General Comments: Pt continuing to have difficulty following commands and coordinating movements.        Exercises   General Comments        Pertinent Vitals/Pain Pain Assessment Pain Assessment: 0-10 Pain Score: 3  Faces Pain Scale: Hurts a little bit Pain Location: L hip Pain Descriptors / Indicators: Grimacing, Guarding, Sore Pain Intervention(s): Limited activity within patient's tolerance, Monitored during session, Repositioned     PT Goals (current goals can now be found in the care plan section) Acute Rehab PT Goals Patient Stated Goal: none stated PT Goal Formulation: With patient Time For Goal Achievement: 11/28/22 Potential to Achieve Goals: Good Progress towards PT goals: Progressing toward goals    Frequency    7X/week      PT Plan Current plan remains appropriate       AM-PAC PT "6 Clicks" Mobility   Outcome Measure  Help needed turning from your back to your side while in a flat bed without using bedrails?: A Little Help needed moving from lying on your back to sitting on the side of a flat bed without using bedrails?: A Lot Help needed moving to and from a bed to a chair (including a wheelchair)?: Total Help needed standing up from a chair using your arms (e.g., wheelchair or bedside chair)?: Total Help needed to walk in hospital room?: Total Help needed climbing 3-5 steps with a railing? : Total 6 Click Score: 9    End of Session Equipment Utilized During Treatment: Gait belt Activity Tolerance: Patient tolerated treatment well Patient left: with call bell/phone within reach;in chair;with chair alarm set Nurse Communication: Mobility status PT Visit Diagnosis: Unsteadiness on feet (R26.81);Other abnormalities of gait and mobility (R26.89);Muscle  weakness (generalized) (M62.81);Difficulty in walking, not elsewhere classified (R26.2);Pain     Time: [id number] PT Time Calculation (min) (ACUTE ONLY): 19 min  Charges:  $Therapeutic Activity: 8-22 mins                     Michelle Nasuti, PTA Acute Rehabilitation Services Secure Chat Preferred  [phone]    Michelle Nasuti 11/15/2022, 3:01 PM

## 2022-11-15 NOTE — Progress Notes (Addendum)
Rounding Note    Patient Name: [name] Date of Encounter: 11/15/2022  [place] Cardiologist: Jenkins Rouge, MD   Subjective   No acute overnight events. No recurrent significant bradycardia noted on telemetry. No reports of chest pain or shortness of breath.  Inpatient Medications    Scheduled Meds:  allopurinol  100 mg Oral Daily   ascorbic acid  500 mg Oral Daily   aspirin EC  81 mg Oral Daily   Chlorhexidine Gluconate Cloth  6 each Topical Q0600   cholecalciferol  1,000 Units Oral Daily   clopidogrel  75 mg Oral Daily   docusate sodium  100 mg Oral BID   ferrous sulfate  325 mg Oral BID WC   folic acid  1 mg Oral Daily   isosorbide mononitrate  90 mg Oral Daily   levothyroxine  25 mcg Oral Daily   lisinopril  20 mg Oral Daily   multivitamin with minerals  1 tablet Oral Daily   pantoprazole  40 mg Oral Daily   ranolazine  1,000 mg Oral Daily   simvastatin  20 mg Oral Daily   zinc sulfate  220 mg Oral Daily   Continuous Infusions:  sodium chloride 75 mL/hr at 11/14/22 0854   methocarbamol (ROBAXIN) IV     PRN Meds: acetaminophen, alum & mag hydroxide-simeth, diphenhydrAMINE, HYDROcodone-acetaminophen, HYDROcodone-acetaminophen, menthol-cetylpyridinium **OR** phenol, methocarbamol **OR** methocarbamol (ROBAXIN) IV, metoCLOPramide **OR** metoCLOPramide (REGLAN) injection, morphine injection, nitroGLYCERIN, ondansetron **OR** ondansetron (ZOFRAN) IV   Vital Signs    Vitals:   11/14/22 2200 11/15/22 0100 11/15/22 0520 11/15/22 0742  BP: (!) 148/83 132/66 (!) 162/61 (!) 176/68  Pulse:    (!) 58  Resp: 16 16 16    Temp: 98.6 F (37 C) 98.7 F (37.1 C) 98.6 F (37 C) 98.2 F (36.8 C)  TempSrc: Oral Oral Oral Oral  SpO2: 98% 100% 94% 98%  Weight:      Height:        Intake/Output Summary (Last 24 hours) at 11/15/2022 0837 Last data filed at 11/15/2022 0500 Gross per 24 hour  Intake 120.5 ml  Output 875 ml  Net -754.5 ml      11/13/2022    12:32 PM 11/05/2022    1:52 PM 09/18/2022   11:08 AM  Last 3 Weights  Weight (lbs) 148 lb 143 lb 9.6 oz 148 lb 4.8 oz  Weight (kg) 67.132 kg 65.137 kg 67.268 kg      Telemetry    Atrial fibrillation with rates mostly in the 50s to 60s (occasionally high 40s briefly) - Personally Reviewed  ECG    No new ECG tracing today. - Personally Reviewed  Physical Exam   GEN: Frail 89 year old Caucasian male in no acute distress.   Neck: No JVD. Cardiac: Irregularly irregular rhythm with low normal rate. No murmurs, rubs, or gallops. Respiratory: Clear to auscultation bilaterally. No wheezes, rhonchi, or rales.  GI: Soft, non-distended, and non-tender.  MS: No lower extremity edema. No deformity. Skin: Warm and dry. Neuro:  No focal deficits.  Psych: Normal affect. Responds appropriately.   Labs    High Sensitivity Troponin:  No results for input(s): "TROPONINIHS" in the last 720 hours.   Chemistry Recent Labs  Lab 11/14/22 0015  NA 137  K 4.1  CL 108  CO2 19*  GLUCOSE 139*  BUN 27*  CREATININE 1.10  CALCIUM 8.2*  GFRNONAA >60  ANIONGAP 10    Lipids No results for input(s): "CHOL", "TRIG", "HDL", "  LABVLDL", "LDLCALC", "CHOLHDL" in the last 168 hours.  Hematology Recent Labs  Lab 11/14/22 0015  WBC 8.7  RBC 3.06*  HGB 9.8*  HCT 30.1*  MCV 98.4  MCH 32.0  MCHC 32.6  RDW 14.3  PLT 170   Thyroid No results for input(s): "TSH", "FREET4" in the last 168 hours.  BNPNo results for input(s): "BNP", "PROBNP" in the last 168 hours.  DDimer No results for input(s): "DDIMER" in the last 168 hours.   Radiology    DG Pelvis Portable  Result Date: 11/13/2022 CLINICAL DATA:  Status post left hip replacement EXAM: PORTABLE PELVIS 1-2 VIEWS COMPARISON:  Right hip x-ray 08/01/2022. FINDINGS: There is a new left hip arthroplasty in anatomic alignment. There is lateral soft tissue swelling and skin staples compatible with recent surgery. There is no acute fracture. There are moderate  degenerative changes of the right hip, unchanged. Peripheral vascular calcifications are present. IMPRESSION: New left hip arthroplasty in anatomic alignment. Electronically Signed   By: Ronney Asters M.D.   On: 11/13/2022 16:31   DG HIP UNILAT WITH PELVIS 1V LEFT  Result Date: 11/13/2022 CLINICAL DATA:  Total left hip arthroplasty. EXAM: DG HIP (WITH OR WITHOUT PELVIS) 1V*L* COMPARISON:  Radiographs 12/17/2020 FINDINGS: Well seated components of a total left hip arthroplasty without complicating features. IMPRESSION: Well seated components of a total left hip arthroplasty. Electronically Signed   By: Marijo Sanes M.D.   On: 11/13/2022 16:03   DG C-Arm 1-60 Min-No Report  Result Date: 11/13/2022 Fluoroscopy was utilized by the requesting physician.  No radiographic interpretation.    Cardiac Studies   Left Cardiac Catheterization 02/27/2017: Prox RCA to Mid RCA lesion, 0 %stenosed. Mid RCA lesion, 30 %stenosed. Dist RCA lesion, 70 %stenosed. Ost Cx to Prox Cx lesion, 100 %stenosed. Dist LAD lesion, 100 %stenosed. Mid LAD lesion, 50 %stenosed. Ost LAD to Prox LAD lesion, 60 %stenosed. Ramus-2 lesion, 70 %stenosed. A STENT SYNERGY DES 3X32 drug eluting stent was successfully placed. Ramus-1 lesion, 80 %stenosed. Post intervention, there is a 0% residual stenosis. A STENT SYNERGY DES 3X24 drug eluting stent was successfully placed, and overlaps previously placed stent. Ost Ramus lesion, 95 %stenosed. Post intervention, there is a 0% residual stenosis.   Successful complex PCI to the ramus intermediate vessel which had a 95% ostial stenosis followed by diffuse 80% proximal stenosis and 70% mid stenosis.  The three lesion were treated with high-speed rotational atherectomy with a 1.5 mm and a 1.75 mm burr,  cutting balloon atherotomy with a 3.010 mm Wolverine balloon, and tandem DES stenting with a 3.032 mm and 3.024 mm Synergy DES stents postdilated to 3.28 m millimeters ostially to 3.21  mm distally with the entire segments being reduced to 0%.    Recommendation: Lifelong DAPT.  Continue aggressive medical therapy for the patient's severe concomitant CAD. _______________   Myoview 06/18/2022:   Findings are consistent with prior myocardial infarction. 2 fixed moderate defects noted consistent with LAD and RCA infarction. No ischemia. The study is low risk.   LV perfusion is abnormal. There is no evidence of ischemia. There is evidence of infarction. Defect 1: There is a small defect with moderate reduction in uptake present in the mid to basal anteroseptal location(s) that is fixed. There is normal wall motion in the defect area. Consistent with infarction. Defect 2: There is a small defect with moderate reduction in uptake present in the apical to mid inferior location(s). There is normal wall motion in the defect area.  Consistent with infarction.   Left ventricular function is normal. Nuclear stress EF: 66 %. The left ventricular ejection fraction is hyperdynamic (>65%). End diastolic cavity size is mildly enlarged.  Patient Profile     89 y.o. male with a history of CAD s/p DES to RCA in 2017 and DES to ramus intermedius in 2018, paroxysmal atrial fibrillation not on anticoagulation due to falls, carotid artery disease s/p left CEA, CVA, hypertension, hyperlipidemia, and hypothyroidism who presented to [facility] on 11/13/2022 for a planned total hip arthroplasty with Dr. Rush Farmer under spinal anesthesia. He tolerated the procedure well but became bradycardic while in the PACU with heart rates in the 30s for which Cardiology was consulted.   Assessment & Plan    Atrial Fibrillation with Slow Ventricular Response Patient has a history of atrial fibrillation with slow ventricular response. Last EKG in the office showed atrial fibrillation with rate of 54 bpm. Heart rate dropped to the 30s while in PACU after hip surgery. EKG showed atrial fibrillation with ventricular rate of 35  bpm. However, BP was stable wit this. Phenylephrine was weaned off and heart rates improved.  - Rates stable in the 50s to 60s (very briefly 49 bpm while I was in the room). - Patient asymptomatic.  - Continue to avoid AV nodal agents.  - CHA2DS2-VASc = 6 (CAD, CVA x2, HTN, age x2). However, he has not been on chronic anticoagulation given history of falls.  - Will discuss with MD about whether outpatient monitor is needed.   CAD S/p DES to RCA in 2017 and DES to ramus intermedius in 2018.  - No anginal symptoms.  - Continue home Imdur 90mg  daily and Ranexa 1000mg  twice daily. - On long-term DAPT with Aspirin and Plavix. - Continue statin.    Hypertension BP labile this admission. Systolic BP ranged from 123456 to 184 yesterday. Per review of chart, looks like BP is usually well controlled as an outpatient.  - Most recent BP 176/68 but has not received morning medications yest. - Continue home medications for now: Imdur 90mg  daily and Lisinopril 20mg  daily. - If BP remains elevated, could consider increasing Lisinopril to 30mg  daily.   Otherwise, per primary team: - Left hip osteoarthritis s/p left total hip arthroplasty on 3/26 - Hypothyroidism - Acute on chronic anemia: hemoglobin down from 12.0 yesterday to 9.8 today   For questions or updates, please contact [place] Please consult [email or web address] for contact info under        Signed, Eppie Gibson  11/15/2022, 8:37 AM    Patient seen and examined with Sande Rives PA-C.  Agree as above, with the following exceptions and changes as noted below. Feels well overall, appears brighter today. Gen: NAD, CV: irregular and bradycardic, Lungs: clear, Abd: soft, Extrem: no edema, Neuro: alert, normal moodAll available labs, radiology testing, previous records reviewed. Continue home meds, no recurrence of marked bradycardia. Patient is stable from CV standpoint for hospital discharge, will arrange outpt follow up.    Elouise Munroe, MD 11/15/22 10:16 AM

## 2022-11-16 NOTE — Progress Notes (Signed)
Physical Therapy Treatment Patient Details Name: [name] MRN: [id number] DOB: [birth year] Today's Date: 11/16/2022   History of Present Illness Pt is a 89 y.o. male admitted 11/13/2022 for L total hip arthroplasty (anterior approach). Post op complicated by bradycardia. PMH: CAD s/p DES to RCA '17, DES to RI '18, carotid artery disease s/p L CEA, HTN, HLD, CVA, paroxsymal Afib (not on long term [place] 2/2 falls), CKD, gout    PT Comments    Pt was received sitting in recliner and agreeable to session focused on increasing gait distance. Pt was able to improve standing transfers progressing from min A to min guard, however continuing to require intermittent assist for balance upon initial stand due to posterior lean. Pt required dense cues for safe hand placement and anterior weight shift during standing. Pt demonstrated minor shaking upon standing, but significantly improved from previous sessions. Pt able to tolerate significantly increased gait distance this session with one seated rest break due to fatigue. Pt required cues for RW proximity throughout, however demonstrated improved balance requiring min guard and a chair follow for safety. Pt continues to benefit from PT services to progress toward functional mobility goals.     Recommendations for follow up therapy are one component of a multi-disciplinary discharge planning process, led by the attending physician.  Recommendations may be updated based on patient status, additional functional criteria and insurance authorization.  Follow Up Recommendations       Assistance Recommended at Discharge Frequent or constant Supervision/Assistance  Patient can return home with the following A little help with walking and/or transfers;A little help with bathing/dressing/bathroom;Assistance with cooking/housework;Direct supervision/assist for medications management;Direct supervision/assist for financial management;Assist for transportation;Help with  stairs or ramp for entrance   Equipment Recommendations  Rolling walker (2 wheels);BSC/3in1;Wheelchair (measurements PT);Wheelchair cushion (measurements PT);Hospital bed    Recommendations for Other Services       Precautions / Restrictions Precautions Precautions: Fall;Other (comment) Precaution Comments: watch HR Restrictions Weight Bearing Restrictions: Yes LLE Weight Bearing: Weight bearing as tolerated     Mobility  Bed Mobility Overal bed mobility: Needs Assistance Bed Mobility: Supine to Sit     Supine to sit: Mod assist     General bed mobility comments: Pt beginning and ending session in recliner    Transfers Overall transfer level: Needs assistance Equipment used: Rolling walker (2 wheels) Transfers: Sit to/from Stand Sit to Stand: Min assist, Min guard           General transfer comment: from recliner to RW x4 initially with min A for power up and balance due to posterior lean. Pt required increased time and cues for safe hand placement and anterior weight shift. Pt sitting quickly x1 due to posterior lean and inability to reach upright posture. Transfer via Lift Equipment: Stedy  Ambulation/Gait Ambulation/Gait assistance: Counsellor (Feet): 80 Feet Assistive device: Rolling walker (2 wheels) Gait Pattern/deviations: Step-through pattern, Decreased stride length, Trunk flexed, Decreased step length - left     Pre-gait activities: Weight shifting in stedy General Gait Details: Pt demonstrated slow step-through pattern with small step lengths. Pt required cues for RW proximity and management, upright posture, and larger steps throughout. Pt required one seated recovery break due to fatigue.       Balance Overall balance assessment: Needs assistance Sitting-balance support: No upper extremity supported, Feet supported Sitting balance-Leahy Scale: Fair Sitting balance - Comments: sitting in recliner Postural control: Posterior  lean Standing balance support: Bilateral upper extremity supported, During functional  activity, Reliant on assistive device for balance Standing balance-Leahy Scale: Poor Standing balance comment: with RW support. Initial standing balance required min A due to posterior lean progressing to min guard                            Cognition Arousal/Alertness: Awake/alert Behavior During Therapy: WFL for tasks assessed/performed Overall Cognitive Status: Impaired/Different from baseline                                          Exercises      General Comments        Pertinent Vitals/Pain Pain Assessment Pain Assessment: 0-10 Pain Score: 2  Faces Pain Scale: Hurts little more Pain Location: L hip Pain Descriptors / Indicators: Grimacing, Guarding, Sore Pain Intervention(s): Limited activity within patient's tolerance, Monitored during session, Repositioned     PT Goals (current goals can now be found in the care plan section) Acute Rehab PT Goals Patient Stated Goal: none stated PT Goal Formulation: With patient Time For Goal Achievement: 11/28/22 Potential to Achieve Goals: Good Progress towards PT goals: Progressing toward goals    Frequency    7X/week      PT Plan Current plan remains appropriate       AM-PAC PT "6 Clicks" Mobility   Outcome Measure  Help needed turning from your back to your side while in a flat bed without using bedrails?: A Little Help needed moving from lying on your back to sitting on the side of a flat bed without using bedrails?: A Lot Help needed moving to and from a bed to a chair (including a wheelchair)?: A Little Help needed standing up from a chair using your arms (e.g., wheelchair or bedside chair)?: A Little Help needed to walk in hospital room?: A Little Help needed climbing 3-5 steps with a railing? : Total 6 Click Score: 15    End of Session Equipment Utilized During Treatment: Gait belt Activity  Tolerance: Patient tolerated treatment well Patient left: with call bell/phone within reach;in chair;with chair alarm set Nurse Communication: Mobility status PT Visit Diagnosis: Unsteadiness on feet (R26.81);Other abnormalities of gait and mobility (R26.89);Muscle weakness (generalized) (M62.81);Difficulty in walking, not elsewhere classified (R26.2);Pain     Time: [id number] PT Time Calculation (min) (ACUTE ONLY): 31 min  Charges:  $Gait Training: 23-37 mins $Therapeutic Activity: 23-37 mins                     Michelle Nasuti, PTA Acute Rehabilitation Services Secure Chat Preferred  [phone]    Michelle Nasuti 11/16/2022, 1:15 PM

## 2022-11-16 NOTE — Progress Notes (Signed)
Occupational Therapy Treatment Patient Details Name: [name] MRN: [id number] DOB: 1928/04/18 Today's Date: 11/16/2022   History of present illness Pt is a 89 y.o. male admitted 11/13/2022 for L total hip arthroplasty (anterior approach). Post op complicated by bradycardia. PMH: CAD s/p DES to RCA '17, DES to RI '18, carotid artery disease s/p L CEA, HTN, HLD, CVA, paroxsymal Afib (not on long term [place] 2/2 falls), CKD, gout   OT comments  Pt. Seen for skilled OT treatment session.  Pt. In recliner upon arrival and end of session.  Focus of session was use of A/E for LB dressing.  Pt. Able to achieve certain portions of use of reacher but unable to successfully use for doffing socks.  Confusion remains impacting pts. Ability for following one step commands and ability to return demo of use of a/e.  Will continue with current goals.  Agree with current d/c plans.     Recommendations for follow up therapy are one component of a multi-disciplinary discharge planning process, led by the attending physician.  Recommendations may be updated based on patient status, additional functional criteria and insurance authorization.    Assistance Recommended at Discharge Frequent or constant Supervision/Assistance  Patient can return home with the following  Two people to help with walking and/or transfers;Two people to help with bathing/dressing/bathroom   Equipment Recommendations  Hospital bed;Wheelchair cushion (measurements OT);Wheelchair (measurements OT);BSC/3in1;Other (comment)    Recommendations for Other Services      Precautions / Restrictions Precautions Precautions: Fall;Other (comment) Precaution Comments: watch HR Restrictions Weight Bearing Restrictions: Yes LLE Weight Bearing: Weight bearing as tolerated       Mobility Bed Mobility                    Transfers                         Balance                                           ADL  either performed or assessed with clinical judgement   ADL Overall ADL's : Needs assistance/impaired                     Lower Body Dressing: Maximal assistance;Cueing for safety;Cueing for sequencing;Cueing for compensatory techniques;With adaptive equipment Lower Body Dressing Details (indicate cue type and reason): introduction of use for A/E, pt. unable to follow sequencing and instructions for succesful return demo               General ADL Comments: limited success with introduction of use of a/e secondary to cognitive issues affecting one step command following, and sequencing.  was able to follow how to squeeze the handle on the reacher and attempted to pull off of foot but not complete    Extremity/Trunk Assessment              Vision       Perception     Praxis      Cognition Arousal/Alertness: Awake/alert Behavior During Therapy: WFL for tasks assessed/performed Overall Cognitive Status: Impaired/Different from baseline Area of Impairment: Orientation, Attention, Memory, Following commands, Awareness, Safety/judgement, Problem solving                 Orientation Level: Disoriented to, Situation Current Attention Level: Sustained Memory: Decreased short-term memory  Following Commands: Follows one step commands with increased time Safety/Judgement: Decreased awareness of safety, Decreased awareness of deficits Awareness: Intellectual Problem Solving: Slow processing, Decreased initiation, Difficulty sequencing, Requires verbal cues, Requires tactile cues General Comments: pt. able to state where [place] was [place]), provided wrong birthday, states (june, and b.day is feb.)        Exercises      Shoulder Instructions       General Comments      Pertinent Vitals/ Pain       Pain Assessment Pain Assessment: No/denies pain  Home Living                                          Prior Functioning/Environment               Frequency  Min 2X/week        Progress Toward Goals  OT Goals(current goals can now be found in the care plan section)  Progress towards OT goals: Progressing toward goals     Plan      Co-evaluation                 AM-PAC OT "6 Clicks" Daily Activity     Outcome Measure   Help from another person eating meals?: A Little Help from another person taking care of personal grooming?: A Little Help from another person toileting, which includes using toliet, bedpan, or urinal?: Total Help from another person bathing (including washing, rinsing, drying)?: A Lot Help from another person to put on and taking off regular upper body clothing?: A Lot Help from another person to put on and taking off regular lower body clothing?: Total 6 Click Score: 12    End of Session    OT Visit Diagnosis: Unsteadiness on feet (R26.81);Other abnormalities of gait and mobility (R26.89);Muscle weakness (generalized) (M62.81)   Activity Tolerance Patient tolerated treatment well   Patient Left in chair;with call bell/phone within reach   Nurse Communication          Time: 1012-1020 OT Time Calculation (min): 8 min  Charges: OT General Charges $OT Visit: 1 Visit OT Treatments $Self Care/Home Management : 8-22 mins  Sonia Baller, COTA/L Acute Rehabilitation [phone]   Clearnce Sorrel Lorraine-COTA/L 11/16/2022, 12:40 PM

## 2022-11-16 NOTE — Progress Notes (Signed)
Physical Therapy Treatment Patient Details Name: [name] MRN: [id number] DOB: 17-Jun-1928 Today's Date: 11/16/2022   History of Present Illness Pt is a 89 y.o. male admitted 11/13/2022 for L total hip arthroplasty (anterior approach). Post op complicated by bradycardia. PMH: CAD s/p DES to RCA '17, DES to RI '18, carotid artery disease s/p L CEA, HTN, HLD, CVA, paroxsymal Afib (not on long term [place] 2/2 falls), CKD, gout    PT Comments    Pt was received in supine and agreeable to session focused on standing transfers and balance. Pt continued to require up to mod A for bed mobility due to weakness and difficulty problem solving for technique and hand placement. Pt able to improve standing transfers with use of the stedy this session, requiring min guard for safety. Pt required increased time for initiation and power up and cues for technique, but no physical assist. Once standing, pt required cues for B knee extension with pt able to slightly improve. Pt was unable to achieve full hip extension due to L hip pain, requiring assist to be able to put stedy paddles down. Pt able to weight shift in stedy to increase LLE WB. Pt demonstrated decreased shakiness with use of stedy this session, however continued to have some confusion throughout session. Pt continues to benefit from PT services to progress toward functional mobility goals.     Recommendations for follow up therapy are one component of a multi-disciplinary discharge planning process, led by the attending physician.  Recommendations may be updated based on patient status, additional functional criteria and insurance authorization.  Follow Up Recommendations       Assistance Recommended at Discharge Frequent or constant Supervision/Assistance  Patient can return home with the following A little help with walking and/or transfers;A little help with bathing/dressing/bathroom;Assistance with cooking/housework;Direct supervision/assist for  medications management;Direct supervision/assist for financial management;Assist for transportation;Help with stairs or ramp for entrance   Equipment Recommendations  Rolling walker (2 wheels);BSC/3in1;Wheelchair (measurements PT);Wheelchair cushion (measurements PT);Hospital bed    Recommendations for Other Services       Precautions / Restrictions Precautions Precautions: Fall;Other (comment) Precaution Comments: watch HR Restrictions Weight Bearing Restrictions: Yes LLE Weight Bearing: Weight bearing as tolerated     Mobility  Bed Mobility Overal bed mobility: Needs Assistance Bed Mobility: Supine to Sit     Supine to sit: Mod assist     General bed mobility comments: Assist for BLE advancement and trunk elevation. Dense cues for technique with pt demonstrating difficulty problem solving sequence and hand placement    Transfers Overall transfer level: Needs assistance Equipment used: Ambulation equipment used Transfers: Sit to/from Stand, Bed to chair/wheelchair/BSC Sit to Stand: Min guard           General transfer comment: Pt able to stand from EOB to stedy x3 with min guard for safety and increased time for power up. Pt required increased time for initiation and cues for pushing up with BLE. Cues for upright posture and B knee extension once standing. Pt had some confusion with use of stedy for transfer with pt attempting to stand during the transfer requiring cues to remain seated on stedy paddles. Transfer via Lift Equipment: Stedy  Ambulation/Gait             Pre-gait activities: Weight shifting in stedy General Gait Details: unable       Balance Overall balance assessment: Needs assistance Sitting-balance support: No upper extremity supported, Feet supported Sitting balance-Leahy Scale: Fair Sitting balance - Comments:  Sitting EOB with intermittent posterior lean requiring cues for anterior weight shift to correct. Postural control: Posterior  lean Standing balance support: Bilateral upper extremity supported, During functional activity, Reliant on assistive device for balance Standing balance-Leahy Scale: Poor Standing balance comment: with stedy support                            Cognition Arousal/Alertness: Awake/alert Behavior During Therapy: WFL for tasks assessed/performed Overall Cognitive Status: Impaired/Different from baseline                                          Exercises      General Comments        Pertinent Vitals/Pain Pain Assessment Pain Assessment: Faces Faces Pain Scale: Hurts little more Pain Location: L hip Pain Descriptors / Indicators: Grimacing, Guarding, Sore Pain Intervention(s): Limited activity within patient's tolerance, Monitored during session, Repositioned     PT Goals (current goals can now be found in the care plan section) Acute Rehab PT Goals Patient Stated Goal: none stated PT Goal Formulation: With patient Time For Goal Achievement: 11/28/22 Potential to Achieve Goals: Good Progress towards PT goals: Progressing toward goals    Frequency    7X/week      PT Plan Current plan remains appropriate       AM-PAC PT "6 Clicks" Mobility   Outcome Measure  Help needed turning from your back to your side while in a flat bed without using bedrails?: A Little Help needed moving from lying on your back to sitting on the side of a flat bed without using bedrails?: A Lot Help needed moving to and from a bed to a chair (including a wheelchair)?: Total Help needed standing up from a chair using your arms (e.g., wheelchair or bedside chair)?: A Little Help needed to walk in hospital room?: Total Help needed climbing 3-5 steps with a railing? : Total 6 Click Score: 11    End of Session Equipment Utilized During Treatment: Gait belt Activity Tolerance: Patient tolerated treatment well Patient left: with call bell/phone within reach;in chair;with  chair alarm set Nurse Communication: Mobility status PT Visit Diagnosis: Unsteadiness on feet (R26.81);Other abnormalities of gait and mobility (R26.89);Muscle weakness (generalized) (M62.81);Difficulty in walking, not elsewhere classified (R26.2);Pain     Time: [id number] PT Time Calculation (min) (ACUTE ONLY): 28 min  Charges:  $Therapeutic Activity: 23-37 mins                     Michelle Nasuti, PTA Acute Rehabilitation Services Secure Chat Preferred  [phone]    Michelle Nasuti 11/16/2022, 9:37 AM

## 2022-11-16 NOTE — Progress Notes (Signed)
Patient ID: [name], male   DOB: 15-Mar-1928, 89 y.o.   MRN: [id number] The patient is awake and alert.  His vital signs remained stable.  He has had very slow mobility with therapy given his weakness and advanced age.  He has equipment at home and family support.  The goal is to try to maximize his therapy while he is here with the plan of hopefully discharging to home tomorrow.  His left operative hip is stable.

## 2022-11-17 MED ORDER — HYDROCODONE-ACETAMINOPHEN 5-325 MG PO TABS: 1.0000 | ORAL_TABLET | Freq: Four times a day (QID) | ORAL | 0 refills | Status: DC | PRN

## 2022-11-17 NOTE — TOC Transition Note (Signed)
Transition of Care [facility]) - CM/SW Discharge Note   Patient Details  Name: [name] MRN: [id number] Date of Birth: 1927/10/03  [facility]) CM/SW Contact:  Tom-Johnson, Renea Ee, RN Phone Number: 11/17/2022, 2:31 PM   Clinical Narrative:     Patient is scheduled for discharge today. Readmission Prevention Assessment done.  Outpatient referrals, hospital f/u and discharge instructions on AVS.  Home health info on AVS. Equipments to be delivered to patient's home by [employer].  Family at bedside and will transport at discharge.  No further TOC needs noted.   Final next level of care: Home w Home Health Services Barriers to Discharge: Barriers Resolved   Patient Goals and CMS Choice CMS Medicare.gov Compare Post Acute Care list provided to:: Patient Choice offered to / list presented to : Adult Children  Discharge Placement                  Patient to be transferred to facility by: Family      Discharge Plan and Services Additional resources added to the After Visit Summary for     Discharge Planning Services: CM Consult Post Acute Care Choice: Home Health, Durable Medical Equipment          DME Arranged: Hospital bed, Bedside commode DME Agency: [employer] Date DME Agency Contacted: 11/15/22 Time DME Agency Contacted: [phone] Representative spoke with at DME Agency: Erasmo Downer HH Arranged: PT, OT, Nurse's Aide [place] Agency: [place] Date [place]: 11/14/22 Time [place]: 44 Representative spoke with at [place]: [place] ([place]) Interventions SDOH Screenings   Food Insecurity: No Food Insecurity (11/13/2022)  Housing: [place]  (11/13/2022)  Transportation Needs: No Transportation Needs (11/13/2022)  Utilities: Not At Risk (11/13/2022)  Tobacco Use: Low Risk  (11/14/2022)     Readmission Risk Interventions    11/17/2022    2:30 PM  Readmission Risk Prevention Plan  Post Dischage  Appt Complete  Medication Screening Complete  Transportation Screening Complete

## 2022-11-17 NOTE — Plan of Care (Signed)
?  Problem: Education: ?Goal: Knowledge of the prescribed therapeutic regimen will improve ?Outcome: Progressing ?  ?Problem: Activity: ?Goal: Ability to avoid complications of mobility impairment will improve ?Outcome: Progressing ?  ?Problem: Pain Management: ?Goal: Pain level will decrease with appropriate interventions ?Outcome: Progressing ?  ?Problem: Skin Integrity: ?Goal: Will show signs of wound healing ?Outcome: Progressing ?  ?

## 2022-11-17 NOTE — Discharge Summary (Signed)
Patient ID: [name] MRN: [id number] DOB/AGE: 1927-10-13 89 y.o.  Admit date: 11/13/2022 Discharge date: 11/17/2022  Admission Diagnoses:  Principal Problem:   Unilateral primary osteoarthritis, left hip Active Problems:   Status post total replacement of left hip   S/P total hip arthroplasty   Discharge Diagnoses:  Same  Past Medical History:  Diagnosis Date   Arthritis    AV BLOCK, 1ST DEGREE    Carotid artery disease ([place])    a. s/p Left ECA followed by Dr. Donnetta Hutching   Coronary artery disease    Coronary artery disease involving coronary bypass graft of native heart with angina pectoris ([place])    a. LHC 12/2015  3vd Left Cx 100% with colaterals, and distally LAD occluded, DES to prox-mid RCA  b. 02/2017: repeat cath with patent RCA stent and stable CAD; 02/27/2017: Rota-PCI to Ost RI   GERD (gastroesophageal reflux disease)    HLD (hyperlipidemia)    HTN (hypertension)    Hypothyroidism    Myocardial infarction ([place])    20 years   NSVT (nonsustained ventricular tachycardia) ([place])    during stress test 12/2015   Presence of drug coated stent in RCA & Ramus Intermedius. 02/28/2017   12/2015: PCI p-mRCA - Stent Resolute Integ 3.5x34 02/2017: Rota-PCI o-mRamus - overlapping DES STENT SYNERGY DES 3X32  & 3x24 (tapered post-dilation)   Stroke [facility])     Surgeries: Procedure(s): LEFT TOTAL HIP ARTHROPLASTY ANTERIOR APPROACH on 11/13/2022   Consultants:   Discharged Condition: Improved  Hospital Course: [name] is an 89 y.o. male who was admitted 11/13/2022 for operative treatment ofUnilateral primary osteoarthritis, left hip. Patient has severe unremitting pain that affects sleep, daily activities, and work/hobbies. After pre-op clearance the patient was taken to the operating room on 11/13/2022 and underwent  Procedure(s): LEFT TOTAL HIP ARTHROPLASTY ANTERIOR APPROACH.    Patient was given perioperative antibiotics:  Anti-infectives (From admission, onward)    Start      Dose/Rate Route Frequency Ordered Stop   11/14/22 0600  ceFAZolin (ANCEF) IVPB 2g/100 mL premix        2 g 200 mL/hr over 30 Minutes Intravenous On call to O.R. 11/13/22 1211 11/13/22 1437   11/13/22 1915  ceFAZolin (ANCEF) IVPB 1 g/50 mL premix        1 g 100 mL/hr over 30 Minutes Intravenous Every 6 hours 11/13/22 1829 11/14/22 0110   11/13/22 1217  ceFAZolin (ANCEF) 2-4 GM/100ML-% IVPB       Note to Pharmacy: Nyoka Cowden D: cabinet override      11/13/22 1217 11/13/22 1438        Patient was given sequential compression devices, early ambulation, and chemoprophylaxis to prevent DVT.  Patient benefited maximally from hospital stay and there were no complications.    Recent vital signs: Patient Vitals for the past 24 hrs:  BP Temp Temp src Pulse Resp SpO2  11/17/22 0832 (!) 173/73 98.4 F (36.9 C) Oral 61 14 99 %  11/17/22 0419 (!) 150/63 98 F (36.7 C) Oral (!) 49 16 98 %  11/16/22 2031 (!) 155/67 98.9 F (37.2 C) Oral (!) 51 18 100 %     Recent laboratory studies: No results for input(s): "WBC", "HGB", "HCT", "PLT", "NA", "K", "CL", "CO2", "BUN", "CREATININE", "GLUCOSE", "INR", "CALCIUM" in the last 72 hours.  Invalid input(s): "PT", "2"   Discharge Medications:   Allergies as of 11/17/2022       Reactions   Beta Adrenergic Blockers Other (See Comments)  Hx of AV block. Should avoid        Medication List     TAKE these medications    acetaminophen 500 MG tablet Commonly known as: TYLENOL Take 1 tablet (500 mg total) by mouth every 6 (six) hours as needed for moderate pain (wrist). What changed: when to take this   allopurinol 100 MG tablet Commonly known as: ZYLOPRIM Take 100 mg by mouth daily.   ascorbic acid 500 MG tablet Commonly known as: VITAMIN C Take 500 mg by mouth daily.   aspirin EC 81 MG tablet Take 1 tablet (81 mg total) by mouth daily.   b complex vitamins tablet Take 1 tablet by mouth daily.   CALCIUM 500 PO Take 500 mg by  mouth daily.   clopidogrel 75 MG tablet Commonly known as: PLAVIX TAKE 1 TABLET DAILY   ferrous sulfate 325 (65 FE) MG tablet Take 325 mg by mouth 2 (two) times daily with a meal.   Fish Oil 1000 MG Caps Take 1,000 mg by mouth daily.   folic acid 1 MG tablet Commonly known as: FOLVITE Take 1 tablet (1 mg total) by mouth daily.   Glucosamine 500 MG Caps Take 500 mg by mouth daily.   HYDROcodone-acetaminophen 5-325 MG tablet Commonly known as: NORCO/VICODIN Take 1 tablet by mouth every 6 (six) hours as needed for moderate pain (pain score 4-6).   isosorbide mononitrate 60 MG 24 hr tablet Commonly known as: IMDUR TAKE ONE AND ONE-HALF TABLETS DAILY   levothyroxine 25 MCG tablet Commonly known as: SYNTHROID Take 25 mcg by mouth daily.   lisinopril 20 MG tablet Commonly known as: ZESTRIL Take 1 tablet (20 mg total) by mouth daily.   multivitamin capsule Take 1 capsule by mouth daily.   nitroGLYCERIN 0.4 MG SL tablet Commonly known as: NITROSTAT PLACE 1 TABLET UNDER THE TONGUE EVERY 5 MINUTES AS NEEDED FOR CHEST PAIN. NOT TO EXCEED 3 DOSES What changed: See the new instructions.   ranolazine 1000 MG SR tablet Commonly known as: RANEXA TAKE 1 TABLET TWICE A DAY What changed: when to take this   simvastatin 20 MG tablet Commonly known as: ZOCOR TAKE 1 TABLET DAILY   TUMS PO Take 1 tablet by mouth as needed (heartburn).   Vitamin D-3 25 MCG (1000 UT) Caps Take 1,000 Units by mouth daily.   zinc sulfate 220 (50 Zn) MG capsule Take 1 capsule (220 mg total) by mouth daily.               Durable Medical Equipment  (From admission, onward)           Start     Ordered   11/15/22 1450  For home use only DME Bedside commode  Once       Comments: Patient confined to one room  Question:  Patient needs a bedside commode to treat with the following condition  Answer:  Status post total hip replacement, left   11/15/22 1452   11/15/22 1445  For home use only  DME Hospital bed  Once       Question Answer Comment  Length of Need 6 Months   Patient has (list medical condition): recent L total hip arthroplasty,  PMH: CAD s/p DES to RCA , CAD, s/p L CEA, HTN, HLD, CVA, paroxsymal Afib , CKD, gout   The above medical condition requires: Patient requires the ability to reposition frequently   Head must be elevated greater than: 30 degrees   Bed type  Semi-electric   Support Surface: Gel Overlay      11/15/22 1448   11/13/22 1830  DME 3 n 1  Once        11/13/22 1829   11/13/22 1830  DME Walker rolling  Once       Question Answer Comment  Walker: With 5 Inch Wheels   Patient needs a walker to treat with the following condition Status post total replacement of left hip      11/13/22 1829            Diagnostic Studies: DG Pelvis Portable  Result Date: 11/13/2022 CLINICAL DATA:  Status post left hip replacement EXAM: PORTABLE PELVIS 1-2 VIEWS COMPARISON:  Right hip x-ray 08/01/2022. FINDINGS: There is a new left hip arthroplasty in anatomic alignment. There is lateral soft tissue swelling and skin staples compatible with recent surgery. There is no acute fracture. There are moderate degenerative changes of the right hip, unchanged. Peripheral vascular calcifications are present. IMPRESSION: New left hip arthroplasty in anatomic alignment. Electronically Signed   By: Ronney Asters M.D.   On: 11/13/2022 16:31   DG HIP UNILAT WITH PELVIS 1V LEFT  Result Date: 11/13/2022 CLINICAL DATA:  Total left hip arthroplasty. EXAM: DG HIP (WITH OR WITHOUT PELVIS) 1V*L* COMPARISON:  Radiographs 12/17/2020 FINDINGS: Well seated components of a total left hip arthroplasty without complicating features. IMPRESSION: Well seated components of a total left hip arthroplasty. Electronically Signed   By: Marijo Sanes M.D.   On: 11/13/2022 16:03   DG C-Arm 1-60 Min-No Report  Result Date: 11/13/2022 Fluoroscopy was utilized by the requesting physician.  No radiographic  interpretation.    Disposition: Discharge disposition: 01-Home or Self Care          Follow-up Information     Mcarthur Rossetti, MD Follow up in 2 week(s).   Specialty: Orthopedic Surgery Contact information: [place] [address] [place], [place] Follow up.   Specialty: Home Health Services Why: Home health has been arranged. They will contact you to schedule apt within 1 to 2 days post discharge. Contact information: [address] [place] [address] [phone]                  Signed: Mcarthur Rossetti 11/17/2022, 11:16 AM

## 2022-11-17 NOTE — Progress Notes (Signed)
Physical Therapy Treatment Patient Details Name: [name] MRN: [id number] DOB: December 12, 1927 Today's Date: 11/17/2022   History of Present Illness Pt is a 89 y.o. male admitted 11/13/2022 for L total hip arthroplasty (anterior approach). Post op complicated by bradycardia. PMH: CAD s/p DES to RCA '17, DES to RI '18, carotid artery disease s/p L CEA, HTN, HLD, CVA, paroxsymal Afib (not on long term [place] 2/2 falls), CKD, gout    PT Comments    Pt supine in bed this session.  He required mod-max VCs this session.  He continues to be a little groggy with short term memory deficits.  He was able to increase activity but very fatigued post session.  PTA issued HEP for home use.  He reports he does have a RW at home.     Recommendations for follow up therapy are one component of a multi-disciplinary discharge planning process, led by the attending physician.  Recommendations may be updated based on patient status, additional functional criteria and insurance authorization.  Follow Up Recommendations       Assistance Recommended at Discharge Frequent or constant Supervision/Assistance  Patient can return home with the following A little help with walking and/or transfers;A little help with bathing/dressing/bathroom;Assistance with cooking/housework;Direct supervision/assist for medications management;Direct supervision/assist for financial management;Assist for transportation;Help with stairs or ramp for entrance   Equipment Recommendations  Rolling walker (2 wheels);BSC/3in1;Wheelchair (measurements PT);Wheelchair cushion (measurements PT);Hospital bed    Recommendations for Other Services       Precautions / Restrictions Precautions Precautions: Fall;Other (comment) Precaution Comments: watch HR Restrictions Weight Bearing Restrictions: Yes LLE Weight Bearing: Weight bearing as tolerated     Mobility  Bed Mobility Overal bed mobility: Needs Assistance Bed Mobility: Supine to Sit      Supine to sit: Supervision     General bed mobility comments: Increased time and effort to move to edge of bed.  Used hooking method to move LLE to edge of bed.Heavy use of bed rails and elevated seat height to move to edge of bed.      Transfers Overall transfer level: Needs assistance Equipment used: Rolling walker (2 wheels) Transfers: Sit to/from Stand Sit to Stand: Min assist, Mod assist           General transfer comment: Increased time with max VCs for technique and hand placement.  He required min to mos assistance to rise into standing this session. With multiple attempts at time and emphasis on weight shifting forward.    Ambulation/Gait Ambulation/Gait assistance: Min guard Gait Distance (Feet): 120 Feet Assistive device: Rolling walker (2 wheels)         General Gait Details: Pt demonstrated slow step-through pattern with small step lengths. Pt required cues for RW proximity and management, upright posture, and larger steps throughout. Pt required one seated recovery break due to fatigue.  Ambulated with a flexed patten at B Knees.  Possible limb length discrepancy?   Stairs Stairs: Yes Stairs assistance: Min assist Stair Management: Two rails, With walker, Forwards, Backwards Number of Stairs: 3 General stair comments: x 2 rails forward with min assistance.  x1 with RW and backwards negotiation.  He was not confident with this technique so will go forward with his L rail at home and HHA on the R from his daughter.   Wheelchair Mobility    Modified Rankin (Stroke Patients Only)       Balance Overall balance assessment: Needs assistance Sitting-balance support: No upper extremity supported, Feet supported Sitting balance-Leahy Scale:  Fair       Standing balance-Leahy Scale: Poor Standing balance comment: with RW support. Initial standing balance required min A due to posterior lean progressing to min guard                             Cognition Arousal/Alertness: Awake/alert Behavior During Therapy: WFL for tasks assessed/performed Overall Cognitive Status: Impaired/Different from baseline Area of Impairment: Orientation, Attention, Memory, Following commands, Awareness, Safety/judgement, Problem solving                 Orientation Level: Disoriented to, Situation Current Attention Level: Sustained Memory: Decreased short-term memory Following Commands: Follows one step commands with increased time Safety/Judgement: Decreased awareness of safety, Decreased awareness of deficits Awareness: Intellectual Problem Solving: Slow processing, Decreased initiation, Difficulty sequencing, Requires verbal cues, Requires tactile cues General Comments: Pt appears groggy and has poor STM this session.        Exercises Total Joint Exercises Ankle Circles/Pumps: AROM, Both, 20 reps, Supine Quad Sets: AROM, Left, 10 reps, Supine Short Arc Quad: AROM, Left, 10 reps, Supine Heel Slides: AAROM, Left, 10 reps, Supine Hip ABduction/ADduction: AAROM, Left, 10 reps, Supine    General Comments        Pertinent Vitals/Pain Pain Assessment Pain Assessment: 0-10 Pain Score: 6  Pain Location: L hip Pain Descriptors / Indicators: Grimacing, Guarding, Sore Pain Intervention(s): Monitored during session, Repositioned    Home Living                          Prior Function            PT Goals (current goals can now be found in the care plan section) Acute Rehab PT Goals Potential to Achieve Goals: Good Progress towards PT goals: Progressing toward goals    Frequency    7X/week      PT Plan Current plan remains appropriate    Co-evaluation              AM-PAC PT "6 Clicks" Mobility   Outcome Measure  Help needed turning from your back to your side while in a flat bed without using bedrails?: A Little Help needed moving from lying on your back to sitting on the side of a flat bed without using  bedrails?: A Lot Help needed moving to and from a bed to a chair (including a wheelchair)?: A Little Help needed standing up from a chair using your arms (e.g., wheelchair or bedside chair)?: A Little Help needed to walk in hospital room?: A Little Help needed climbing 3-5 steps with a railing? : A Little 6 Click Score: 17    End of Session Equipment Utilized During Treatment: Gait belt Activity Tolerance: Patient tolerated treatment well Patient left: with call bell/phone within reach;in chair;with chair alarm set Nurse Communication: Mobility status PT Visit Diagnosis: Unsteadiness on feet (R26.81);Other abnormalities of gait and mobility (R26.89);Muscle weakness (generalized) (M62.81);Difficulty in walking, not elsewhere classified (R26.2);Pain Pain - Right/Left: Left Pain - part of body: Hip     Time: 1216-1301 PT Time Calculation (min) (ACUTE ONLY): 45 min  Charges:  $Gait Training: 8-22 mins $Therapeutic Exercise: 8-22 mins $Therapeutic Activity: 8-22 mins                     Erasmo Leventhal , PTA Acute Rehabilitation Services Office [phone]    Jaiana Sheffer Eli Hose 11/17/2022, 1:14 PM

## 2022-11-17 NOTE — Plan of Care (Signed)
IV removed, daughter at bedside and had no further questions on discharge paperwork.    Problem: Education: Goal: Knowledge of the prescribed therapeutic regimen will improve 11/17/2022 1359 by Karna Christmas, RN Outcome: Adequate for Discharge 11/17/2022 1030 by Karna Christmas, RN Outcome: Progressing Goal: Understanding of discharge needs will improve Outcome: Adequate for Discharge Goal: Individualized Educational Video(s) Outcome: Adequate for Discharge   Problem: Activity: Goal: Ability to avoid complications of mobility impairment will improve 11/17/2022 1359 by Karna Christmas, RN Outcome: Adequate for Discharge 11/17/2022 1030 by Karna Christmas, RN Outcome: Progressing Goal: Ability to tolerate increased activity will improve Outcome: Adequate for Discharge   Problem: Clinical Measurements: Goal: Postoperative complications will be avoided or minimized Outcome: Adequate for Discharge   Problem: Pain Management: Goal: Pain level will decrease with appropriate interventions 11/17/2022 1359 by Karna Christmas, RN Outcome: Adequate for Discharge 11/17/2022 1030 by Karna Christmas, RN Outcome: Progressing   Problem: Skin Integrity: Goal: Will show signs of wound healing 11/17/2022 1359 by Karna Christmas, RN Outcome: Adequate for Discharge 11/17/2022 1030 by Karna Christmas, RN Outcome: Progressing   Problem: Education: Goal: Knowledge of General Education information will improve Description: Including pain rating scale, medication(s)/side effects and non-pharmacologic comfort measures Outcome: Adequate for Discharge   Problem: Health Behavior/Discharge Planning: Goal: Ability to manage health-related needs will improve Outcome: Adequate for Discharge   Problem: Clinical Measurements: Goal: Ability to maintain clinical measurements within normal limits will improve Outcome: Adequate for Discharge Goal: Will remain free from infection Outcome: Adequate for  Discharge Goal: Diagnostic test results will improve Outcome: Adequate for Discharge Goal: Respiratory complications will improve Outcome: Adequate for Discharge Goal: Cardiovascular complication will be avoided Outcome: Adequate for Discharge   Problem: Activity: Goal: Risk for activity intolerance will decrease Outcome: Adequate for Discharge   Problem: Nutrition: Goal: Adequate nutrition will be maintained Outcome: Adequate for Discharge   Problem: Coping: Goal: Level of anxiety will decrease Outcome: Adequate for Discharge   Problem: Elimination: Goal: Will not experience complications related to bowel motility Outcome: Adequate for Discharge Goal: Will not experience complications related to urinary retention Outcome: Adequate for Discharge   Problem: Pain Managment: Goal: General experience of comfort will improve Outcome: Adequate for Discharge   Problem: Safety: Goal: Ability to remain free from injury will improve Outcome: Adequate for Discharge   Problem: Skin Integrity: Goal: Risk for impaired skin integrity will decrease Outcome: Adequate for Discharge   Problem: Acute Rehab OT Goals (only OT should resolve) Goal: Pt. Will Perform Lower Body Dressing Outcome: Adequate for Discharge Goal: Pt. Will Transfer To Toilet Outcome: Adequate for Discharge Goal: Pt. Will Perform Toileting-Clothing Manipulation Outcome: Adequate for Discharge   Problem: Acute Rehab PT Goals(only PT should resolve) Goal: Pt Will Go Supine/Side To Sit Outcome: Adequate for Discharge Goal: Pt Will Go Sit To Supine/Side Outcome: Adequate for Discharge Goal: Patient Will Transfer Sit To/From Stand Outcome: Adequate for Discharge Goal: Pt Will Ambulate Outcome: Adequate for Discharge

## 2022-11-17 NOTE — Progress Notes (Signed)
Patient ID: [name], male   DOB: 05/10/1928, 89 y.o.   MRN: [id number] No acute changes.  Left hip stable.  Can be discharged to home with family today.

## 2022-11-21 ENCOUNTER — Other Ambulatory Visit: Payer: Self-pay | Admitting: Cardiovascular Disease

## 2022-11-27 ENCOUNTER — Encounter: Payer: Medicare Other | Admitting: Orthopaedic Surgery

## 2022-11-28 ENCOUNTER — Ambulatory Visit (INDEPENDENT_AMBULATORY_CARE_PROVIDER_SITE_OTHER): Payer: Medicare Other | Admitting: Orthopaedic Surgery

## 2022-11-28 DIAGNOSIS — Z96642 Presence of left artificial hip joint: Secondary | ICD-10-CM

## 2022-11-28 NOTE — Progress Notes (Signed)
The patient comes in today for his first postoperative follow-up after a left hip replacement.  He is 89 years old.  His son is with him.  He is ambulate with a walker and doing well overall.  He is someone who is chronically on aspirin and Plavix.  His left hip incision looks great.  The staples are removed and Steri-Strips applied.  He does have a moderate seroma and I did aspirate 60 cc of thin fluid off the area.  He understands this can reaccumulate and if it becomes uncomfortable next week we can always see him back and drain that again.  His son does state that he has been transitioning to a cane and again is doing well.  He does not need pain medications he states.  Will see him back in 4 weeks to see how he is doing overall and less we need to see him sooner for seroma accumulation.

## 2022-11-29 ENCOUNTER — Telehealth: Payer: Self-pay

## 2022-11-29 NOTE — Telephone Encounter (Signed)
Patient's daughter [name] called wanting to know if patient needs to continue to wear compression socks.  Stated that it's hard to put compression socks on everyday.  CB# [phone].  Please advise.  Thank you.

## 2022-11-29 NOTE — Telephone Encounter (Signed)
Lvm advising  

## 2022-12-06 ENCOUNTER — Other Ambulatory Visit: Payer: Self-pay

## 2022-12-06 ENCOUNTER — Telehealth: Payer: Self-pay | Admitting: Orthopaedic Surgery

## 2022-12-06 DIAGNOSIS — Z96642 Presence of left artificial hip joint: Secondary | ICD-10-CM

## 2022-12-06 NOTE — Telephone Encounter (Signed)
Patients family requesting Out Patient P/T wanting to continue they think it will help his healing to have more P/T.Marland KitchenPlease call daughter at ( [phone]

## 2022-12-06 NOTE — Telephone Encounter (Signed)
Ordered

## 2022-12-12 ENCOUNTER — Other Ambulatory Visit: Payer: Self-pay

## 2022-12-12 ENCOUNTER — Encounter: Payer: Self-pay | Admitting: Physical Therapy

## 2022-12-12 ENCOUNTER — Ambulatory Visit: Payer: Medicare Other | Attending: Orthopaedic Surgery | Admitting: Physical Therapy

## 2022-12-12 DIAGNOSIS — R2689 Other abnormalities of gait and mobility: Secondary | ICD-10-CM | POA: Insufficient documentation

## 2022-12-12 DIAGNOSIS — M6281 Muscle weakness (generalized): Secondary | ICD-10-CM | POA: Insufficient documentation

## 2022-12-12 DIAGNOSIS — M25552 Pain in left hip: Secondary | ICD-10-CM | POA: Diagnosis present

## 2022-12-12 DIAGNOSIS — Z96642 Presence of left artificial hip joint: Secondary | ICD-10-CM | POA: Insufficient documentation

## 2022-12-12 NOTE — Patient Instructions (Signed)
Access Code: [email or web address] URL: [email or web address] Date: 12/12/2022 Prepared by: Rosana Hoes  Exercises - Supine Bridge  - 2 x daily - 10 reps - Active Straight Leg Raise with Quad Set  - 2 x daily - 10 reps - Seated Long Arc Quad  - 2 x daily - 20 reps - Heel Raises with Counter Support  - 1 x daily - 20 reps - Standing March with Counter Support  - 2 x daily - 20 reps - Side Stepping with Counter Support  - 2 x daily - 20 reps - Standing Hip Extension with Counter Support  - 2 x daily - 20 reps - Mini Squat with Counter Support  - 2 x daily - 20 reps - Sit to Stand with Armchair  - 2 x daily - 10 reps

## 2022-12-12 NOTE — Therapy (Signed)
OUTPATIENT PHYSICAL THERAPY EVALUATION   Patient Name: [name] MRN: [id number] DOB:06/08/1928, 89 y.o., male Today's Date: 12/12/2022   END OF SESSION:  PT End of Session - 12/12/22 1033     Visit Number 1    Number of Visits 17    Date for PT Re-Evaluation 02/06/23    Authorization Type MCR    Progress Note Due on Visit 10    PT Start Time [phone]    PT Stop Time 1018    PT Time Calculation (min) 36 min    Activity Tolerance Patient tolerated treatment well    Behavior During Therapy WFL for tasks assessed/performed             Past Medical History:  Diagnosis Date   Arthritis    AV BLOCK, 1ST DEGREE    Carotid artery disease    a. s/p Left ECA followed by [name]   Coronary artery disease    Coronary artery disease involving coronary bypass graft of native heart with angina pectoris    a. LHC 12/2015  3vd Left Cx 100% with colaterals, and distally LAD occluded, DES to prox-mid RCA  b. 02/2017: repeat cath with patent RCA stent and stable CAD; 02/27/2017: Rota-PCI to Ost RI   GERD (gastroesophageal reflux disease)    HLD (hyperlipidemia)    HTN (hypertension)    Hypothyroidism    Myocardial infarction    20 years   NSVT (nonsustained ventricular tachycardia)    during stress test 12/2015   Presence of drug coated stent in RCA & Ramus Intermedius. 02/28/2017   12/2015: PCI p-mRCA - Stent Resolute Integ 3.5x34 02/2017: Rota-PCI o-mRamus - overlapping DES STENT SYNERGY DES 3X32  & 3x24 (tapered post-dilation)   Stroke    Past Surgical History:  Procedure Laterality Date   CARDIAC CATHETERIZATION     x2   CARDIAC CATHETERIZATION N/A 01/11/2016   Procedure: Left Heart Cath and Coronary Angiography;  Surgeon: Iran Ouch, MD;  Location: [facility] INVASIVE CV LAB;  Service: Cardiovascular;  Laterality: N/A;   CAROTID ENDARTERECTOMY  11/28/2006   left   CATARACT EXTRACTION W/ INTRAOCULAR LENS IMPLANT Bilateral    CORONARY ATHERECTOMY N/A 02/27/2017   Procedure:  Coronary Atherectomy;  Surgeon: Lennette Bihari, MD;  Location: [facility] INVASIVE CV LAB;  Service: Cardiovascular;  Laterality: N/A;   HERNIA REPAIR     LEFT HEART CATH AND CORONARY ANGIOGRAPHY N/A 02/21/2017   Procedure: Left Heart Cath and Coronary Angiography;  Surgeon: Runell Gess, MD;  Location: [facility] INVASIVE CV LAB;  Service: Cardiovascular;  Laterality: N/A;   TONSILLECTOMY     TOTAL HIP ARTHROPLASTY Left 11/13/2022   Procedure: LEFT TOTAL HIP ARTHROPLASTY ANTERIOR APPROACH;  Surgeon: Kathryne Hitch, MD;  Location: [facility] OR;  Service: Orthopedics;  Laterality: Left;   Patient Active Problem List   Diagnosis Date Noted   Status post total replacement of left hip 11/13/2022   S/P total hip arthroplasty 11/13/2022   History of stroke 04/10/2022   Anemia due to chronic kidney disease 02/06/2022   Macular degeneration 02/06/2022   Multifocal pneumonia 12/17/2020   Pneumonia due to COVID-19 virus 12/17/2020   New onset atrial fibrillation 12/17/2020   CKD (chronic kidney disease), stage III 12/17/2020   Anemia in chronic kidney disease (CKD) 12/17/2020   Poor balance 10/06/2019   Laryngitis 04/17/2017   Cough 03/29/2017   Acute bronchitis 03/29/2017   Acute upper respiratory infection 03/29/2017   Presence of drug coated stent in  RCA & Ramus Intermedius. 02/28/2017   Gout flare 02/22/2017   Coronary artery disease involving native coronary artery of native heart with angina pectoris    Carotid artery disease    HLD (hyperlipidemia)    Unstable angina 02/20/2017   Atherosclerosis of native coronary artery of native heart    Essential hypertension 01/06/2009   Second degree atrioventricular block, Mobitz type I 01/06/2009   AV block, 1st degree 01/06/2009    PCP: Stevphen Rochester, MD  REFERRING PROVIDER: Kathryne Hitch, MD  REFERRING DIAG: Status post total replacement of left hip  THERAPY DIAG:  Pain in left hip  Muscle weakness (generalized)  Other  abnormalities of gait and mobility  Rationale for Evaluation and Treatment: Rehabilitation  ONSET DATE: 11/13/2022   SUBJECTIVE:  SUBJECTIVE STATEMENT: Patient had a hip replacement on the left side. He has been doing some exercises at home. Currently using hospital bed on the ground floor, but he can go up stairs one step at a time holding on to a railing. He was using a walker but switched to a cane 2 days ago. He denies any pain at rest, but reports the hip will bother him with walking or stairs. He can go up/down a few stairs using the railing but has not tried to go to his 2nd floor yet.   PERTINENT HISTORY: Left THA anterior approach 11/13/2022  PAIN:  Are you having pain? No:  NPRS scale: 0/10, 4/10 pain with walking and stairs Pain location: Left hip Pain description: Aching Aggravating factors: Walking, stairs Relieving factors: Rest  PRECAUTIONS: Fall  WEIGHT BEARING RESTRICTIONS: No  FALLS:  Has patient fallen in last 6 months? Yes. Number of falls 1  LIVING ENVIRONMENT: Lives with: lives with their family Lives in: House/apartment Stairs: Yes: Internal: 14 steps; can reach both and External: 5 steps; on right going up Has following equipment at home: Single point cane and Walker - 2 wheeled  PLOF: Independent with household mobility with device, Needs assistance with ADLs, and Needs assistance with homemaking  PATIENT GOALS: Get back to walking   OBJECTIVE:  PATIENT SURVEYS:   Not assessed due to patient arriving late  COGNITION: Overall cognitive status: No family/caregiver present to determine baseline cognitive functioning     SENSATION: WFL  MUSCLE LENGTH: Not assessed  POSTURE:   Rounded shoulders  PALPATION: Non-tender   LOWER EXTREMITY ROM:   Not assessed  LOWER EXTREMITY MMT:  MMT Right eval Left eval  Hip flexion 4 3-  Hip extension 3 2  Hip abduction 4- 2  Hip adduction    Hip internal rotation    Hip external rotation     Knee flexion 5 4  Knee extension 5 4+  Ankle dorsiflexion    Ankle plantarflexion    Ankle inversion    Ankle eversion     (Blank rows = not tested)  FUNCTIONAL TESTS:  Sit to stand: unable to stand without using UE for support  : 205 ft using SPC  GAIT: Distance walked: 205 ft Assistive device utilized: Single point cane on right Level of assistance: SBA Comments: Decreased gait speed, decreased step length, antalgic on left   TODAY'S TREATMENT:    [facility] Adult PT Treatment:  DATE: 12/12/2022 Therapeutic Exercise: SLR x 10 each Bridge x 10 LAQ x 20 Sit to stand using UE to assist with rising then lowering without UE support x 10 Reviewed previous exercises consisting of standing march, heel raises, hip extension, side stepping, mini-squat  PATIENT EDUCATION:  Education details: Exam findings, POC, HEP, can progress walking with [facility] Person educated: Patient Education method: Explanation, Demonstration, Tactile cues, Verbal cues, and Handouts Education comprehension: verbalized understanding, returned demonstration, verbal cues required, tactile cues required, and needs further education  HOME EXERCISE PROGRAM: Access Code: [email or web address]   ASSESSMENT: CLINICAL IMPRESSION: Patient is a 89 y.o. male who was seen today for physical therapy evaluation and treatment for left THA with anterior approach on 11/13/2022. He is ambulating with SPC and demonstrates gross limitations with his mobility and strength.    OBJECTIVE IMPAIRMENTS: Abnormal gait, decreased activity tolerance, decreased balance, decreased endurance, decreased ROM, decreased strength, and pain.   ACTIVITY LIMITATIONS: lifting, standing, squatting, stairs, transfers, dressing, and locomotion level  PARTICIPATION LIMITATIONS: meal prep, cleaning, laundry, and community activity  PERSONAL FACTORS: Age, Fitness, Past/current experiences, and Time since onset of  injury/illness/exacerbation are also affecting patient's functional outcome.   REHAB POTENTIAL: Good  CLINICAL DECISION MAKING: Stable/uncomplicated  EVALUATION COMPLEXITY: Low   GOALS: Goals reviewed with patient? Yes  SHORT TERM GOALS: Target date: 01/09/2023  Patient will be I with initial HEP in order to progress with therapy. Baseline: HEP provided at eval Goal status: INITIAL  2.  Patient will be able to perform sit to stand without UE support to indicate improved LE strength and transfer ability Baseline: patient requires UE to stand Goal status: INITIAL  3.  Patient will report </= 2/10 left hip pain with wallking and activity to reduce functional limitations Baseline: 4/10 Goal status: INITIAL  LONG TERM GOALS: Target date: 02/06/2023  Patient will be I with final HEP to maintain progress from PT. Baseline: HEP provided at eval Goal status: INITIAL  2.  Patient will improve >/= 500 ft using SPC to improve community access Baseline: 205 ft using SPC Goal status: INITIAL  3.  Patient will be able to negotiate 14 steps with railing using reciprocal pattern to improve independence and household mobility Baseline: step-to pattern for 1-2 steps Goal status: INITIAL  4.  Patient will demonstrate left hip strength >/= 4-/5 MMT and knee strength 5/5 MMT to improve walking tolerance Baseline: see limitations noted above Goal status: INITIAL   PLAN: PT FREQUENCY: 1-2x/week  PT DURATION: 8 weeks  PLANNED INTERVENTIONS: Therapeutic exercises, Therapeutic activity, Neuromuscular re-education, Balance training, Gait training, Patient/Family education, Self Care, Joint mobilization, Stair training, Aquatic Therapy, Cryotherapy, Moist heat, Manual therapy, and Re-evaluation  PLAN FOR NEXT SESSION: Review HEP and progress PRN, progress hip and LE strength, gait and balance training   Rosana Hoes, PT, DPT, LAT, ATC 12/12/22  12:44 PM Phone: [phone] Fax:  [phone]

## 2022-12-18 ENCOUNTER — Ambulatory Visit: Payer: Medicare Other | Admitting: Physical Therapy

## 2022-12-18 ENCOUNTER — Encounter: Payer: Self-pay | Admitting: Physical Therapy

## 2022-12-18 DIAGNOSIS — M6281 Muscle weakness (generalized): Secondary | ICD-10-CM

## 2022-12-18 DIAGNOSIS — M25552 Pain in left hip: Secondary | ICD-10-CM

## 2022-12-18 DIAGNOSIS — R2689 Other abnormalities of gait and mobility: Secondary | ICD-10-CM

## 2022-12-18 NOTE — Therapy (Signed)
OUTPATIENT PHYSICAL THERAPY TREATMENT NOTE   Patient Name: [name] MRN: [id number] DOB:03/13/1928, 89 y.o., male Today's Date: 12/18/2022  PCP: Stevphen Rochester, MD   REFERRING PROVIDER: Kathryne Hitch, MD  END OF SESSION:   PT End of Session - 12/18/22 0853     Visit Number 2    Number of Visits 17    Date for PT Re-Evaluation 02/06/23    Authorization Type MCR    Progress Note Due on Visit 10    PT Start Time 0902    PT Stop Time 0928    PT Time Calculation (min) 26 min             Past Medical History:  Diagnosis Date   Arthritis    AV BLOCK, 1ST DEGREE    Carotid artery disease (HCC)    a. s/p Left ECA followed by [name]   Coronary artery disease    Coronary artery disease involving coronary bypass graft of native heart with angina pectoris (HCC)    a. LHC 12/2015  3vd Left Cx 100% with colaterals, and distally LAD occluded, DES to prox-mid RCA  b. 02/2017: repeat cath with patent RCA stent and stable CAD; 02/27/2017: Rota-PCI to Ost RI   GERD (gastroesophageal reflux disease)    HLD (hyperlipidemia)    HTN (hypertension)    Hypothyroidism    Myocardial infarction (HCC)    20 years   NSVT (nonsustained ventricular tachycardia) (HCC)    during stress test 12/2015   Presence of drug coated stent in RCA & Ramus Intermedius. 02/28/2017   12/2015: PCI p-mRCA - Stent Resolute Integ 3.5x34 02/2017: Rota-PCI o-mRamus - overlapping DES STENT SYNERGY DES 3X32  & 3x24 (tapered post-dilation)   Stroke [facility])    Past Surgical History:  Procedure Laterality Date   CARDIAC CATHETERIZATION     x2   CARDIAC CATHETERIZATION N/A 01/11/2016   Procedure: Left Heart Cath and Coronary Angiography;  Surgeon: Iran Ouch, MD;  Location: [facility] INVASIVE CV LAB;  Service: Cardiovascular;  Laterality: N/A;   CAROTID ENDARTERECTOMY  11/28/2006   left   CATARACT EXTRACTION W/ INTRAOCULAR LENS IMPLANT Bilateral    CORONARY ATHERECTOMY N/A 02/27/2017   Procedure: Coronary  Atherectomy;  Surgeon: Lennette Bihari, MD;  Location: [facility] INVASIVE CV LAB;  Service: Cardiovascular;  Laterality: N/A;   HERNIA REPAIR     LEFT HEART CATH AND CORONARY ANGIOGRAPHY N/A 02/21/2017   Procedure: Left Heart Cath and Coronary Angiography;  Surgeon: Runell Gess, MD;  Location: [facility] INVASIVE CV LAB;  Service: Cardiovascular;  Laterality: N/A;   TONSILLECTOMY     TOTAL HIP ARTHROPLASTY Left 11/13/2022   Procedure: LEFT TOTAL HIP ARTHROPLASTY ANTERIOR APPROACH;  Surgeon: Kathryne Hitch, MD;  Location: [facility] OR;  Service: Orthopedics;  Laterality: Left;   Patient Active Problem List   Diagnosis Date Noted   Status post total replacement of left hip 11/13/2022   S/P total hip arthroplasty 11/13/2022   History of stroke 04/10/2022   Anemia due to chronic kidney disease 02/06/2022   Macular degeneration 02/06/2022   Multifocal pneumonia 12/17/2020   Pneumonia due to COVID-19 virus 12/17/2020   New onset atrial fibrillation (HCC) 12/17/2020   CKD (chronic kidney disease), stage III (HCC) 12/17/2020   Anemia in chronic kidney disease (CKD) 12/17/2020   Poor balance 10/06/2019   Laryngitis 04/17/2017   Cough 03/29/2017   Acute bronchitis 03/29/2017   Acute upper respiratory infection 03/29/2017   Presence of drug  coated stent in RCA & Ramus Intermedius. 02/28/2017   Gout flare 02/22/2017   Coronary artery disease involving native coronary artery of native heart with angina pectoris (HCC)    Carotid artery disease (HCC)    HLD (hyperlipidemia)    Unstable angina (HCC) 02/20/2017   Atherosclerosis of native coronary artery of native heart    Essential hypertension 01/06/2009   Second degree atrioventricular block, Mobitz type I 01/06/2009   AV block, 1st degree 01/06/2009    REFERRING DIAG: Status post total replacement of left hip  THERAPY DIAG:  Pain in left hip  Muscle weakness (generalized)  Other abnormalities of gait and mobility  Rationale for Evaluation  and Treatment Rehabilitation  PERTINENT HISTORY: Left THA anterior approach 11/13/2022  PRECAUTIONS: Fall  SUBJECTIVE:                                                                                                                                                                                      SUBJECTIVE STATEMENT:  My back is a little sore today. I have done the exercises 1 x per day. Haven't been able to do the second round in one day.    PAIN:  Are you having pain? No:  NPRS scale: 0/10, 4/10 pain with walking and stairs Pain location: Left hip Pain description: Aching Aggravating factors: Walking, stairs Relieving factors: Rest   OBJECTIVE: (objective measures completed at initial evaluation unless otherwise dated)   PATIENT SURVEYS:             Not assessed due to patient arriving late   COGNITION: Overall cognitive status: No family/caregiver present to determine baseline cognitive functioning                             SENSATION: WFL   MUSCLE LENGTH: Not assessed   POSTURE:             Rounded shoulders   PALPATION: Non-tender    LOWER EXTREMITY ROM:                       Not assessed   LOWER EXTREMITY MMT:   MMT Right eval Left eval  Hip flexion 4 3-  Hip extension 3 2  Hip abduction 4- 2  Hip adduction      Hip internal rotation      Hip external rotation      Knee flexion 5 4  Knee extension 5 4+  Ankle dorsiflexion      Ankle plantarflexion      Ankle inversion      Ankle eversion       (  Blank rows = not tested)   FUNCTIONAL TESTS:  Sit to stand: unable to stand without using UE for support   : 205 ft using SPC   GAIT: Distance walked: 205 ft Assistive device utilized: Single point cane on right Level of assistance: SBA Comments: Decreased gait speed, decreased step length, antalgic on left     TODAY'S TREATMENT:    [facility] Adult PT Treatment:                                                DATE: 12/18/22 Therapeutic  Exercise: LAQ Left x 20  Clam red band seated 10 x 2  Seated March x 10 each side  STS from bariatric chair using UE to rise and no UE to lower 5 x 2  Standing heel raises 10 x 2  Standing hip abdct x 10 each  Standing hip ext x 10 each  Supine SLR x 10, x 5 , left only Bridge x 10 Supine red band clam 10 x 2  Supine ball squeeze 5 sec x 10     [facility] Adult PT Treatment:                                                DATE: 12/12/2022 Therapeutic Exercise: SLR x 10 each Bridge x 10 LAQ x 20 Sit to stand using UE to assist with rising then lowering without UE support x 10 Reviewed previous exercises consisting of standing march, heel raises, hip extension, side stepping, mini-squat   PATIENT EDUCATION:  Education details: Exam findings, POC, HEP, can progress walking with SPC Person educated: Patient Education method: Explanation, Demonstration, Tactile cues, Verbal cues, and Handouts Education comprehension: verbalized understanding, returned demonstration, verbal cues required, tactile cues required, and needs further education   HOME EXERCISE PROGRAM: Access Code: [email or web address] Exercises - Supine Bridge  - 2 x daily - 10 reps - Active Straight Leg Raise with Quad Set  - 2 x daily - 10 reps - Seated Long Arc Quad  - 2 x daily - 20 reps - Heel Raises with Counter Support  - 1 x daily - 20 reps - Standing March with Counter Support  - 2 x daily - 20 reps - Side Stepping with Counter Support  - 2 x daily - 20 reps - Standing Hip Extension with Counter Support  - 2 x daily - 20 reps - Mini Squat with Counter Support  - 2 x daily - 20 reps - Sit to Stand with Armchair  - 2 x daily - 10 reps     ASSESSMENT: CLINICAL IMPRESSION: Patient is a 89 y.o. male who was seen today for physical therapy  treatment for left THA with anterior approach on 11/13/2022. He arrives with [facility] and requires cues for consistent sequencing, decreased step length and flexed trunk. He reports compliance with  HEP 1 x per day.  Reviewed HEP. Pt arrived late and used the restroom prior to session therefore session shortened. He requires increased time with ambulation. Suggested pt bring RW to PT sessions for safety until his strength and balance are improved. Pt and daughter verbalized understanding. He tolerated session well with min pain during SLR, otherwise no complaints.  OBJECTIVE IMPAIRMENTS: Abnormal gait, decreased activity tolerance, decreased balance, decreased endurance, decreased ROM, decreased strength, and pain.    ACTIVITY LIMITATIONS: lifting, standing, squatting, stairs, transfers, dressing, and locomotion level   PARTICIPATION LIMITATIONS: meal prep, cleaning, laundry, and community activity   PERSONAL FACTORS: Age, Fitness, Past/current experiences, and Time since onset of injury/illness/exacerbation are also affecting patient's functional outcome.    REHAB POTENTIAL: Good   CLINICAL DECISION MAKING: Stable/uncomplicated   EVALUATION COMPLEXITY: Low     GOALS: Goals reviewed with patient? Yes   SHORT TERM GOALS: Target date: 01/09/2023   Patient will be I with initial HEP in order to progress with therapy. Baseline: HEP provided at eval Goal status: INITIAL   2.  Patient will be able to perform sit to stand without UE support to indicate improved LE strength and transfer ability Baseline: patient requires UE to stand Goal status: INITIAL   3.  Patient will report </= 2/10 left hip pain with wallking and activity to reduce functional limitations Baseline: 4/10 Goal status: INITIAL   LONG TERM GOALS: Target date: 02/06/2023   Patient will be I with final HEP to maintain progress from PT. Baseline: HEP provided at eval Goal status: INITIAL   2.  Patient will improve >/= 500 ft using SPC to improve community access Baseline: 205 ft using SPC Goal status: INITIAL   3.  Patient will be able to negotiate 14 steps with railing using reciprocal pattern to  improve independence and household mobility Baseline: step-to pattern for 1-2 steps Goal status: INITIAL   4.  Patient will demonstrate left hip strength >/= 4-/5 MMT and knee strength 5/5 MMT to improve walking tolerance Baseline: see limitations noted above Goal status: INITIAL     PLAN: PT FREQUENCY: 1-2x/week   PT DURATION: 8 weeks   PLANNED INTERVENTIONS: Therapeutic exercises, Therapeutic activity, Neuromuscular re-education, Balance training, Gait training, Patient/Family education, Self Care, Joint mobilization, Stair training, Aquatic Therapy, Cryotherapy, Moist heat, Manual therapy, and Re-evaluation   PLAN FOR NEXT SESSION: Review HEP and progress PRN, progress hip and LE strength, gait and balance training       Jannette Spanner, PTA 12/18/22 9:38 AM Phone: [phone] Fax: [phone]

## 2022-12-24 ENCOUNTER — Ambulatory Visit: Payer: Medicare Other | Attending: Orthopaedic Surgery | Admitting: Physical Therapy

## 2022-12-24 ENCOUNTER — Encounter: Payer: Self-pay | Admitting: Physical Therapy

## 2022-12-24 DIAGNOSIS — R2689 Other abnormalities of gait and mobility: Secondary | ICD-10-CM | POA: Diagnosis present

## 2022-12-24 DIAGNOSIS — M25552 Pain in left hip: Secondary | ICD-10-CM | POA: Diagnosis present

## 2022-12-24 DIAGNOSIS — M6281 Muscle weakness (generalized): Secondary | ICD-10-CM | POA: Insufficient documentation

## 2022-12-24 NOTE — Therapy (Signed)
OUTPATIENT PHYSICAL THERAPY TREATMENT NOTE   Patient Name: [name] MRN: [id number] DOB:1927/12/31, 89 y.o., male Today's Date: 12/24/2022  PCP: Stevphen Rochester, MD   REFERRING PROVIDER: Kathryne Hitch, MD  END OF SESSION:   PT End of Session - 12/24/22 0827     Visit Number 3    Number of Visits 17    Date for PT Re-Evaluation 02/06/23    Authorization Type MCR    Progress Note Due on Visit 10    PT Start Time 0820    PT Stop Time 0845    PT Time Calculation (min) 25 min             Past Medical History:  Diagnosis Date   Arthritis    AV BLOCK, 1ST DEGREE    Carotid artery disease (HCC)    a. s/p Left ECA followed by [name]   Coronary artery disease    Coronary artery disease involving coronary bypass graft of native heart with angina pectoris (HCC)    a. LHC 12/2015  3vd Left Cx 100% with colaterals, and distally LAD occluded, DES to prox-mid RCA  b. 02/2017: repeat cath with patent RCA stent and stable CAD; 02/27/2017: Rota-PCI to [place]   GERD (gastroesophageal reflux disease)    HLD (hyperlipidemia)    HTN (hypertension)    Hypothyroidism    Myocardial infarction (HCC)    20 years   NSVT (nonsustained ventricular tachycardia) (HCC)    during stress test 12/2015   Presence of drug coated stent in RCA & Ramus Intermedius. 02/28/2017   12/2015: PCI p-mRCA - Stent Resolute Integ 3.5x34 02/2017: Rota-PCI o-mRamus - overlapping DES STENT SYNERGY DES 3X32  & 3x24 (tapered post-dilation)   Stroke [facility])    Past Surgical History:  Procedure Laterality Date   CARDIAC CATHETERIZATION     x2   CARDIAC CATHETERIZATION N/A 01/11/2016   Procedure: Left Heart Cath and Coronary Angiography;  Surgeon: Iran Ouch, MD;  Location: [facility] INVASIVE CV LAB;  Service: Cardiovascular;  Laterality: N/A;   CAROTID ENDARTERECTOMY  11/28/2006   left   CATARACT EXTRACTION W/ INTRAOCULAR LENS IMPLANT Bilateral    CORONARY ATHERECTOMY N/A 02/27/2017   Procedure: Coronary  Atherectomy;  Surgeon: Lennette Bihari, MD;  Location: [facility] INVASIVE CV LAB;  Service: Cardiovascular;  Laterality: N/A;   HERNIA REPAIR     LEFT HEART CATH AND CORONARY ANGIOGRAPHY N/A 02/21/2017   Procedure: Left Heart Cath and Coronary Angiography;  Surgeon: Runell Gess, MD;  Location: [facility] INVASIVE CV LAB;  Service: Cardiovascular;  Laterality: N/A;   TONSILLECTOMY     TOTAL HIP ARTHROPLASTY Left 11/13/2022   Procedure: LEFT TOTAL HIP ARTHROPLASTY ANTERIOR APPROACH;  Surgeon: Kathryne Hitch, MD;  Location: [facility] OR;  Service: Orthopedics;  Laterality: Left;   Patient Active Problem List   Diagnosis Date Noted   Status post total replacement of left hip 11/13/2022   S/P total hip arthroplasty 11/13/2022   History of stroke 04/10/2022   Anemia due to chronic kidney disease 02/06/2022   Macular degeneration 02/06/2022   Multifocal pneumonia 12/17/2020   Pneumonia due to COVID-19 virus 12/17/2020   New onset atrial fibrillation (HCC) 12/17/2020   CKD (chronic kidney disease), stage III (HCC) 12/17/2020   Anemia in chronic kidney disease (CKD) 12/17/2020   Poor balance 10/06/2019   Laryngitis 04/17/2017   Cough 03/29/2017   Acute bronchitis 03/29/2017   Acute upper respiratory infection 03/29/2017   Presence of drug  coated stent in RCA & Ramus Intermedius. 02/28/2017   Gout flare 02/22/2017   Coronary artery disease involving native coronary artery of native heart with angina pectoris (HCC)    Carotid artery disease (HCC)    HLD (hyperlipidemia)    Unstable angina (HCC) 02/20/2017   Atherosclerosis of native coronary artery of native heart    Essential hypertension 01/06/2009   Second degree atrioventricular block, Mobitz type I 01/06/2009   AV block, 1st degree 01/06/2009    REFERRING DIAG: Status post total replacement of left hip  THERAPY DIAG:  Pain in left hip  Muscle weakness (generalized)  Other abnormalities of gait and mobility  Rationale for Evaluation  and Treatment Rehabilitation  PERTINENT HISTORY: Left THA anterior approach 11/13/2022  PRECAUTIONS: Fall  SUBJECTIVE:                                                                                                                                                                                      SUBJECTIVE STATEMENT:  No pain. Still doing the exercises 1 x per day.    PAIN:  Are you having pain? No:  NPRS scale: 0/10, 4/10 pain with walking and stairs Pain location: Left hip Pain description: Aching Aggravating factors: Walking, stairs Relieving factors: Rest   OBJECTIVE: (objective measures completed at initial evaluation unless otherwise dated)   PATIENT SURVEYS:             Not assessed due to patient arriving late   COGNITION: Overall cognitive status: No family/caregiver present to determine baseline cognitive functioning                             SENSATION: WFL   MUSCLE LENGTH: Not assessed   POSTURE:             Rounded shoulders   PALPATION: Non-tender    LOWER EXTREMITY ROM:                       Not assessed   LOWER EXTREMITY MMT:   MMT Right eval Left eval  Hip flexion 4 3-  Hip extension 3 2  Hip abduction 4- 2  Hip adduction      Hip internal rotation      Hip external rotation      Knee flexion 5 4  Knee extension 5 4+  Ankle dorsiflexion      Ankle plantarflexion      Ankle inversion      Ankle eversion       (Blank rows = not tested)   FUNCTIONAL TESTS:  Sit to stand:  unable to stand without using UE for support   : 205 ft using SPC   GAIT: Distance walked: 205 ft Assistive device utilized: Single point cane on right Level of assistance: SBA Comments: Decreased gait speed, decreased step length, antalgic on left     TODAY'S TREATMENT:    [facility] Adult PT Treatment:                                                DATE: 12/24/22 Therapeutic Exercise: LAQ Left x 20 , 2# Clam Green  band seated 10 x 2  Seated March 2 x 10  2#  STS from bariatric chair using UE to rise and no UE to lower x 10  Supine SLR to fatigue x15 , left only, progressive quad lag with reps Bridge  2 x 10 Therapeutic activity:  Gait in clinic with Standard walker     [facility] Adult PT Treatment:                                                DATE: 12/18/22 Therapeutic Exercise: LAQ Left x 20  Clam red band seated 10 x 2  Seated March x 10 each side  STS from bariatric chair using UE to rise and no UE to lower 5 x 2  Standing heel raises 10 x 2  Standing hip abdct x 10 each  Standing hip ext x 10 each  Supine SLR x 10, x 5 , left only Bridge x 10 Supine red band clam 10 x 2  Supine ball squeeze 5 sec x 10     [facility] Adult PT Treatment:                                                DATE: 12/12/2022 Therapeutic Exercise: SLR x 10 each Bridge x 10 LAQ x 20 Sit to stand using UE to assist with rising then lowering without UE support x 10 Reviewed previous exercises consisting of standing march, heel raises, hip extension, side stepping, mini-squat   PATIENT EDUCATION:  Education details: Exam findings, POC, HEP, can progress walking with SPC Person educated: Patient Education method: Explanation, Demonstration, Tactile cues, Verbal cues, and Handouts Education comprehension: verbalized understanding, returned demonstration, verbal cues required, tactile cues required, and needs further education   HOME EXERCISE PROGRAM: Access Code: [email or web address] Exercises - Supine Bridge  - 2 x daily - 10 reps - Active Straight Leg Raise with Quad Set  - 2 x daily - 10 reps - Seated Long Arc Quad  - 2 x daily - 20 reps - Heel Raises with Counter Support  - 1 x daily - 20 reps - Standing March with Counter Support  - 2 x daily - 20 reps - Side Stepping with Counter Support  - 2 x daily - 20 reps - Standing Hip Extension with Counter Support  - 2 x daily - 20 reps - Mini Squat with Counter Support  - 2 x daily - 20 reps - Sit to Stand with Armchair   - 2 x daily - 10 reps  ASSESSMENT: CLINICAL IMPRESSION: Patient is a 89 y.o. male who was seen today for physical therapy  treatment for left THA with anterior approach on 11/13/2022. He arrives with both standard walker and SPC. Used Standard walker in clinic and he did well with safety however pace decreased due to nature of standard walker. He does not own and RW. He reports compliance with HEP 1 x per day.  Pt arrived late and used the restroom prior to session therefore session shortened. He tolerated session well without c/o pain. Discussed tardiness at end of session and pt verbalized understanding.      OBJECTIVE IMPAIRMENTS: Abnormal gait, decreased activity tolerance, decreased balance, decreased endurance, decreased ROM, decreased strength, and pain.    ACTIVITY LIMITATIONS: lifting, standing, squatting, stairs, transfers, dressing, and locomotion level   PARTICIPATION LIMITATIONS: meal prep, cleaning, laundry, and community activity   PERSONAL FACTORS: Age, Fitness, Past/current experiences, and Time since onset of injury/illness/exacerbation are also affecting patient's functional outcome.    REHAB POTENTIAL: Good   CLINICAL DECISION MAKING: Stable/uncomplicated   EVALUATION COMPLEXITY: Low     GOALS: Goals reviewed with patient? Yes   SHORT TERM GOALS: Target date: 01/09/2023   Patient will be I with initial HEP in order to progress with therapy. Baseline: HEP provided at eval Goal status: INITIAL   2.  Patient will be able to perform sit to stand without UE support to indicate improved LE strength and transfer ability Baseline: patient requires UE to stand Goal status: INITIAL   3.  Patient will report </= 2/10 left hip pain with wallking and activity to reduce functional limitations Baseline: 4/10 Goal status: INITIAL   LONG TERM GOALS: Target date: 02/06/2023   Patient will be I with final HEP to maintain progress from PT. Baseline: HEP provided at  eval Goal status: INITIAL   2.  Patient will improve >/= 500 ft using SPC to improve community access Baseline: 205 ft using SPC Goal status: INITIAL   3.  Patient will be able to negotiate 14 steps with railing using reciprocal pattern to improve independence and household mobility Baseline: step-to pattern for 1-2 steps Goal status: INITIAL   4.  Patient will demonstrate left hip strength >/= 4-/5 MMT and knee strength 5/5 MMT to improve walking tolerance Baseline: see limitations noted above Goal status: INITIAL     PLAN: PT FREQUENCY: 1-2x/week   PT DURATION: 8 weeks   PLANNED INTERVENTIONS: Therapeutic exercises, Therapeutic activity, Neuromuscular re-education, Balance training, Gait training, Patient/Family education, Self Care, Joint mobilization, Stair training, Aquatic Therapy, Cryotherapy, Moist heat, Manual therapy, and Re-evaluation   PLAN FOR NEXT SESSION: Review HEP and progress PRN, progress hip and LE strength, gait and balance training       Jannette Spanner, PTA 12/24/22 10:18 AM Phone: [phone] Fax: [phone]

## 2022-12-26 ENCOUNTER — Ambulatory Visit: Payer: Medicare Other | Admitting: Physical Therapy

## 2022-12-26 ENCOUNTER — Encounter: Payer: Self-pay | Admitting: Physical Therapy

## 2022-12-26 DIAGNOSIS — M6281 Muscle weakness (generalized): Secondary | ICD-10-CM

## 2022-12-26 DIAGNOSIS — R2689 Other abnormalities of gait and mobility: Secondary | ICD-10-CM

## 2022-12-26 DIAGNOSIS — M25552 Pain in left hip: Secondary | ICD-10-CM

## 2022-12-26 NOTE — Therapy (Signed)
OUTPATIENT PHYSICAL THERAPY TREATMENT NOTE   Patient Name: [name] MRN: [id number] DOB:20-May-1928, 89 y.o., male Today's Date: 12/26/2022  PCP: Stevphen Rochester, MD   REFERRING PROVIDER: Kathryne Hitch, MD  END OF SESSION:   PT End of Session - 12/26/22 0813     Visit Number 4    Number of Visits 17    Date for PT Re-Evaluation 02/06/23    Authorization Type MCR    PT Start Time 0810    PT Stop Time 0848    PT Time Calculation (min) 38 min             Past Medical History:  Diagnosis Date   Arthritis    AV BLOCK, 1ST DEGREE    Carotid artery disease (HCC)    a. s/p Left ECA followed by [name]   Coronary artery disease    Coronary artery disease involving coronary bypass graft of native heart with angina pectoris (HCC)    a. LHC 12/2015  3vd Left Cx 100% with colaterals, and distally LAD occluded, DES to prox-mid RCA  b. 02/2017: repeat cath with patent RCA stent and stable CAD; 02/27/2017: Rota-PCI to [place]   GERD (gastroesophageal reflux disease)    HLD (hyperlipidemia)    HTN (hypertension)    Hypothyroidism    Myocardial infarction (HCC)    20 years   NSVT (nonsustained ventricular tachycardia) (HCC)    during stress test 12/2015   Presence of drug coated stent in RCA & Ramus Intermedius. 02/28/2017   12/2015: PCI p-mRCA - Stent Resolute Integ 3.5x34 02/2017: Rota-PCI o-mRamus - overlapping DES STENT SYNERGY DES 3X32  & 3x24 (tapered post-dilation)   Stroke [facility])    Past Surgical History:  Procedure Laterality Date   CARDIAC CATHETERIZATION     x2   CARDIAC CATHETERIZATION N/A 01/11/2016   Procedure: Left Heart Cath and Coronary Angiography;  Surgeon: Iran Ouch, MD;  Location: [facility] INVASIVE CV LAB;  Service: Cardiovascular;  Laterality: N/A;   CAROTID ENDARTERECTOMY  11/28/2006   left   CATARACT EXTRACTION W/ INTRAOCULAR LENS IMPLANT Bilateral    CORONARY ATHERECTOMY N/A 02/27/2017   Procedure: Coronary Atherectomy;  Surgeon: Lennette Bihari, MD;  Location: [facility] INVASIVE CV LAB;  Service: Cardiovascular;  Laterality: N/A;   HERNIA REPAIR     LEFT HEART CATH AND CORONARY ANGIOGRAPHY N/A 02/21/2017   Procedure: Left Heart Cath and Coronary Angiography;  Surgeon: Runell Gess, MD;  Location: [facility] INVASIVE CV LAB;  Service: Cardiovascular;  Laterality: N/A;   TONSILLECTOMY     TOTAL HIP ARTHROPLASTY Left 11/13/2022   Procedure: LEFT TOTAL HIP ARTHROPLASTY ANTERIOR APPROACH;  Surgeon: Kathryne Hitch, MD;  Location: [facility] OR;  Service: Orthopedics;  Laterality: Left;   Patient Active Problem List   Diagnosis Date Noted   Status post total replacement of left hip 11/13/2022   S/P total hip arthroplasty 11/13/2022   History of stroke 04/10/2022   Anemia due to chronic kidney disease 02/06/2022   Macular degeneration 02/06/2022   Multifocal pneumonia 12/17/2020   Pneumonia due to COVID-19 virus 12/17/2020   New onset atrial fibrillation (HCC) 12/17/2020   CKD (chronic kidney disease), stage III (HCC) 12/17/2020   Anemia in chronic kidney disease (CKD) 12/17/2020   Poor balance 10/06/2019   Laryngitis 04/17/2017   Cough 03/29/2017   Acute bronchitis 03/29/2017   Acute upper respiratory infection 03/29/2017   Presence of drug coated stent in RCA & Ramus Intermedius. 02/28/2017  Gout flare 02/22/2017   Coronary artery disease involving native coronary artery of native heart with angina pectoris (HCC)    Carotid artery disease (HCC)    HLD (hyperlipidemia)    Unstable angina (HCC) 02/20/2017   Atherosclerosis of native coronary artery of native heart    Essential hypertension 01/06/2009   Second degree atrioventricular block, Mobitz type I 01/06/2009   AV block, 1st degree 01/06/2009    REFERRING DIAG: Status post total replacement of left hip  THERAPY DIAG:  Pain in left hip  Muscle weakness (generalized)  Other abnormalities of gait and mobility  Rationale for Evaluation and Treatment  Rehabilitation  PERTINENT HISTORY: Left THA anterior approach 11/13/2022  PRECAUTIONS: Fall  SUBJECTIVE:                                                                                                                                                                                      SUBJECTIVE STATEMENT:  No pain. Still doing the exercises 1 x per day.    PAIN:  Are you having pain? No:  NPRS scale: 0/10, 4/10 pain with walking and stairs Pain location: Left hip Pain description: Aching Aggravating factors: Walking, stairs Relieving factors: Rest   OBJECTIVE: (objective measures completed at initial evaluation unless otherwise dated)   PATIENT SURVEYS:             Not assessed due to patient arriving late   COGNITION: Overall cognitive status: No family/caregiver present to determine baseline cognitive functioning                             SENSATION: WFL   MUSCLE LENGTH: Not assessed   POSTURE:             Rounded shoulders   PALPATION: Non-tender    LOWER EXTREMITY ROM:                       Not assessed   LOWER EXTREMITY MMT:   MMT Right eval Left eval  Hip flexion 4 3-  Hip extension 3 2  Hip abduction 4- 2  Hip adduction      Hip internal rotation      Hip external rotation      Knee flexion 5 4  Knee extension 5 4+  Ankle dorsiflexion      Ankle plantarflexion      Ankle inversion      Ankle eversion       (Blank rows = not tested)   FUNCTIONAL TESTS:  Sit to stand: unable to stand without using UE for support  : 205 ft using SPC   GAIT: Distance walked: 205 ft Assistive device utilized: Single point cane on right Level of assistance: SBA Comments: Decreased gait speed, decreased step length, antalgic on left     TODAY'S TREATMENT:    [facility] Adult PT Treatment:                                                DATE: 12/26/22 Therapeutic Exercise: Nustep L2 UE/LE x 5 minutes Standing heel raises  Hip flexion 2# 10 x 2 -cues for TKE  with Left WB Hamstring curl 2# 10 x 1 Left STS UE to rise, staggered feet for increased WB left LE  x 10 4 inch step up at sink with bil UE , Lt x 10 Left QS into towel 5 sec x 10 Passive hamstring stretch 3 x 30 sec  Left SLR with cues for initial QS Bridge x 10   [facility] Adult PT Treatment:                                                DATE: 12/24/22 Therapeutic Exercise: LAQ Left x 20 , 2# Clam Green  band seated 10 x 2  Seated March 2 x 10 2#  STS from bariatric chair using UE to rise and no UE to lower x 10  Supine SLR to fatigue x15 , left only, progressive quad lag with reps Bridge  2 x 10 Therapeutic activity:  Gait in clinic with Standard walker     [facility] Adult PT Treatment:                                                DATE: 12/18/22 Therapeutic Exercise: LAQ Left x 20  Clam red band seated 10 x 2  Seated March x 10 each side  STS from bariatric chair using UE to rise and no UE to lower 5 x 2  Standing heel raises 10 x 2  Standing hip abdct x 10 each  Standing hip ext x 10 each  Supine SLR x 10, x 5 , left only Bridge x 10 Supine red band clam 10 x 2  Supine ball squeeze 5 sec x 10     [facility] Adult PT Treatment:                                                DATE: 12/12/2022 Therapeutic Exercise: SLR x 10 each Bridge x 10 LAQ x 20 Sit to stand using UE to assist with rising then lowering without UE support x 10 Reviewed previous exercises consisting of standing march, heel raises, hip extension, side stepping, mini-squat   PATIENT EDUCATION:  Education details: Exam findings, POC, HEP, can progress walking with [facility] Person educated: Patient Education method: Explanation, Demonstration, Tactile cues, Verbal cues, and Handouts Education comprehension: verbalized understanding, returned demonstration, verbal cues required, tactile cues required, and needs further education   HOME EXERCISE PROGRAM: Access Code: [email or web address] Exercises -  Supine Bridge  - 2 x daily - 10  reps - Active Straight Leg Raise with Quad Set  - 2 x daily - 10 reps - Seated Long Arc Quad  - 2 x daily - 20 reps - Heel Raises with Counter Support  - 1 x daily - 20 reps - Standing March with Counter Support  - 2 x daily - 20 reps - Side Stepping with Counter Support  - 2 x daily - 20 reps - Standing Hip Extension with Counter Support  - 2 x daily - 20 reps - Mini Squat with Counter Support  - 2 x daily - 20 reps - Sit to Stand with Armchair  - 2 x daily - 10 reps     ASSESSMENT: CLINICAL IMPRESSION: Patient is a 89 y.o. male who was seen today for physical therapy  treatment for left THA with anterior approach on 11/13/2022. He arrives with standard walker. He ambulates with flexed posture and left knee flexed. Cues for TKE on left with closed chain therex today. Used staggered LE with STS to increase left WB. Began 4 inch step up with Bil UE.  He tolerated session well without c/o pain.      OBJECTIVE IMPAIRMENTS: Abnormal gait, decreased activity tolerance, decreased balance, decreased endurance, decreased ROM, decreased strength, and pain.    ACTIVITY LIMITATIONS: lifting, standing, squatting, stairs, transfers, dressing, and locomotion level   PARTICIPATION LIMITATIONS: meal prep, cleaning, laundry, and community activity   PERSONAL FACTORS: Age, Fitness, Past/current experiences, and Time since onset of injury/illness/exacerbation are also affecting patient's functional outcome.    REHAB POTENTIAL: Good   CLINICAL DECISION MAKING: Stable/uncomplicated   EVALUATION COMPLEXITY: Low     GOALS: Goals reviewed with patient? Yes   SHORT TERM GOALS: Target date: 01/09/2023   Patient will be I with initial HEP in order to progress with therapy. Baseline: HEP provided at eval Goal status: INITIAL   2.  Patient will be able to perform sit to stand without UE support to indicate improved LE strength and transfer ability Baseline: patient requires UE to stand Goal status:  INITIAL   3.  Patient will report </= 2/10 left hip pain with wallking and activity to reduce functional limitations Baseline: 4/10 Goal status: INITIAL   LONG TERM GOALS: Target date: 02/06/2023   Patient will be I with final HEP to maintain progress from PT. Baseline: HEP provided at eval Goal status: INITIAL   2.  Patient will improve >/= 500 ft using SPC to improve community access Baseline: 205 ft using SPC Goal status: INITIAL   3.  Patient will be able to negotiate 14 steps with railing using reciprocal pattern to improve independence and household mobility Baseline: step-to pattern for 1-2 steps Goal status: INITIAL   4.  Patient will demonstrate left hip strength >/= 4-/5 MMT and knee strength 5/5 MMT to improve walking tolerance Baseline: see limitations noted above Goal status: INITIAL     PLAN: PT FREQUENCY: 1-2x/week   PT DURATION: 8 weeks   PLANNED INTERVENTIONS: Therapeutic exercises, Therapeutic activity, Neuromuscular re-education, Balance training, Gait training, Patient/Family education, Self Care, Joint mobilization, Stair training, Aquatic Therapy, Cryotherapy, Moist heat, Manual therapy, and Re-evaluation   PLAN FOR NEXT SESSION: Review HEP and progress PRN, progress hip and LE strength, gait and balance training       Jannette Spanner, PTA 12/26/22 9:04 AM Phone: [phone] Fax: [phone]

## 2022-12-28 NOTE — Therapy (Signed)
OUTPATIENT PHYSICAL THERAPY TREATMENT NOTE   Patient Name: [name] MRN: [id number] DOB:1928/05/29, 89 y.o., male Today's Date: 12/31/2022  PCP: Stevphen Rochester, MD REFERRING PROVIDER: Kathryne Hitch, MD   END OF SESSION:   PT End of Session - 12/31/22 0937     Visit Number 5    Number of Visits 17    Date for PT Re-Evaluation 02/06/23    Authorization Type MCR    Progress Note Due on Visit 10    PT Start Time 0932    PT Stop Time 1013    PT Time Calculation (min) 41 min    Activity Tolerance Patient tolerated treatment well    Behavior During Therapy WFL for tasks assessed/performed              Past Medical History:  Diagnosis Date   Arthritis    AV BLOCK, 1ST DEGREE    Carotid artery disease (HCC)    a. s/p Left ECA followed by [name]   Coronary artery disease    Coronary artery disease involving coronary bypass graft of native heart with angina pectoris (HCC)    a. LHC 12/2015  3vd Left Cx 100% with colaterals, and distally LAD occluded, DES to prox-mid RCA  b. 02/2017: repeat cath with patent RCA stent and stable CAD; 02/27/2017: Rota-PCI to Ost [place]   GERD (gastroesophageal reflux disease)    HLD (hyperlipidemia)    HTN (hypertension)    Hypothyroidism    Myocardial infarction (HCC)    20 years   NSVT (nonsustained ventricular tachycardia) (HCC)    during stress test 12/2015   Presence of drug coated stent in RCA & Ramus Intermedius. 02/28/2017   12/2015: PCI p-mRCA - Stent Resolute Integ 3.5x34 02/2017: Rota-PCI o-mRamus - overlapping DES STENT SYNERGY DES 3X32  & 3x24 (tapered post-dilation)   Stroke [facility])    Past Surgical History:  Procedure Laterality Date   CARDIAC CATHETERIZATION     x2   CARDIAC CATHETERIZATION N/A 01/11/2016   Procedure: Left Heart Cath and Coronary Angiography;  Surgeon: Iran Ouch, MD;  Location: [facility] INVASIVE CV LAB;  Service: Cardiovascular;  Laterality: N/A;   CAROTID ENDARTERECTOMY  11/28/2006   left    CATARACT EXTRACTION W/ INTRAOCULAR LENS IMPLANT Bilateral    CORONARY ATHERECTOMY N/A 02/27/2017   Procedure: Coronary Atherectomy;  Surgeon: Lennette Bihari, MD;  Location: [facility] INVASIVE CV LAB;  Service: Cardiovascular;  Laterality: N/A;   HERNIA REPAIR     LEFT HEART CATH AND CORONARY ANGIOGRAPHY N/A 02/21/2017   Procedure: Left Heart Cath and Coronary Angiography;  Surgeon: Runell Gess, MD;  Location: [facility] INVASIVE CV LAB;  Service: Cardiovascular;  Laterality: N/A;   TONSILLECTOMY     TOTAL HIP ARTHROPLASTY Left 11/13/2022   Procedure: LEFT TOTAL HIP ARTHROPLASTY ANTERIOR APPROACH;  Surgeon: Kathryne Hitch, MD;  Location: [facility] OR;  Service: Orthopedics;  Laterality: Left;   Patient Active Problem List   Diagnosis Date Noted   Status post total replacement of left hip 11/13/2022   S/P total hip arthroplasty 11/13/2022   History of stroke 04/10/2022   Anemia due to chronic kidney disease 02/06/2022   Macular degeneration 02/06/2022   Multifocal pneumonia 12/17/2020   Pneumonia due to COVID-19 virus 12/17/2020   New onset atrial fibrillation (HCC) 12/17/2020   CKD (chronic kidney disease), stage III (HCC) 12/17/2020   Anemia in chronic kidney disease (CKD) 12/17/2020   Poor balance 10/06/2019   Laryngitis 04/17/2017  Cough 03/29/2017   Acute bronchitis 03/29/2017   Acute upper respiratory infection 03/29/2017   Presence of drug coated stent in RCA & Ramus Intermedius. 02/28/2017   Gout flare 02/22/2017   Coronary artery disease involving native coronary artery of native heart with angina pectoris (HCC)    Carotid artery disease (HCC)    HLD (hyperlipidemia)    Unstable angina (HCC) 02/20/2017   Atherosclerosis of native coronary artery of native heart    Essential hypertension 01/06/2009   Second degree atrioventricular block, Mobitz type I 01/06/2009   AV block, 1st degree 01/06/2009    REFERRING DIAG: Status post total replacement of left hip  THERAPY DIAG:   Pain in left hip  Muscle weakness (generalized)  Other abnormalities of gait and mobility  Rationale for Evaluation and Treatment Rehabilitation  PERTINENT HISTORY: Left THA anterior approach 11/13/2022  PRECAUTIONS: Fall   SUBJECTIVE:                                                                                                                                                                                     SUBJECTIVE STATEMENT:  Patient reports he is doing well. He works on the exercises at home but they do make him tired.   PAIN:  Are you having pain? No:  NPRS scale: 0/10, 4/10 pain with walking and stairs Pain location: Left hip Pain description: Aching Aggravating factors: Walking, stairs Relieving factors: Rest   OBJECTIVE: (objective measures completed at initial evaluation unless otherwise dated) POSTURE:             Rounded shoulders   LOWER EXTREMITY MMT:   MMT Right eval Left eval Left 12/31/2022  Hip flexion 4 3- 4-  Hip extension 3 2   Hip abduction 4- 2 3-  Hip adduction       Hip internal rotation       Hip external rotation       Knee flexion 5 4   Knee extension 5 4+   Ankle dorsiflexion       Ankle plantarflexion       Ankle inversion       Ankle eversion        (Blank rows = not tested)   FUNCTIONAL TESTS:  Sit to stand: unable to stand without using UE for support   : 205 ft using SPC   GAIT: Distance walked: 205 ft Assistive device utilized: Single point cane on right Level of assistance: SBA Comments: Decreased gait speed, decreased step length, antalgic on left     TODAY'S TREATMENT:    [facility] Adult PT Treatment:  DATE: 12/31/22 Therapeutic Exercise: Nustep L4 x 5 min with UE/LE while taking subjective Sit to stand without UE from elevated table 3 x 10 - gradually lower table for each set LAQ with 3# 2 x 20 each Standing march with 3# 2 x 2 with UE support  Lateral stepping  at counter with yellow at knees 2 x 2 lengths down/back Standing heel toe raise 2 x 20 Forward 4" step-up x 10 each with single UE support   [facility] Adult PT Treatment:                                                DATE: 12/26/22 Therapeutic Exercise: Nustep L2 UE/LE x 5 minutes Standing heel raises  Hip flexion 2# 10 x 2 -cues for TKE with Left WB Hamstring curl 2# 10 x 1 Left STS UE to rise, staggered feet for increased WB left LE  x 10 4 inch step up at sink with bil UE , Lt x 10 Left QS into towel 5 sec x 10 Passive hamstring stretch 3 x 30 sec  Left SLR with cues for initial QS Bridge x 10  [facility] Adult PT Treatment:                                                DATE: 12/24/22 Therapeutic Exercise: LAQ Left x 20 , 2# Clam Green  band seated 10 x 2  Seated March 2 x 10 2#  STS from bariatric chair using UE to rise and no UE to lower x 10  Supine SLR to fatigue x15 , left only, progressive quad lag with reps Bridge  2 x 10 Therapeutic activity:  Gait in clinic with Standard walker   [facility] Adult PT Treatment:                                                DATE: 12/18/22 Therapeutic Exercise: LAQ Left x 20  Clam red band seated 10 x 2  Seated March x 10 each side  STS from bariatric chair using UE to rise and no UE to lower 5 x 2  Standing heel raises 10 x 2  Standing hip abdct x 10 each  Standing hip ext x 10 each  Supine SLR x 10, x 5 , left only Bridge x 10 Supine red band clam 10 x 2  Supine ball squeeze 5 sec x 10    PATIENT EDUCATION:  Education details: HEP Person educated: Patient Education method: Programmer, multimedia, Facilities manager, Actor cues, Verbal cues Education comprehension: verbalized understanding, returned demonstration, verbal cues required, tactile cues required, and needs further education   HOME EXERCISE PROGRAM: Access Code: [email or web address]     ASSESSMENT: CLINICAL IMPRESSION: Patient tolerated therapy well with no adverse effects. Therapy focused primarily on  progress hip and LE strengthening with good tolerance. He does exhibit improved left hip strength but does continue to demonstrate gross gait and balance difficulties. He is still unable to perform STS without UE from standard chair but able to perform from slightly elevated position. He requires consistent cueing for posture with exercises.  No changes made to HEP. Patient would benefit from continued skilled PT to progress strength, walking, balance, and general mobility to maximize his independence and functional ability.      OBJECTIVE IMPAIRMENTS: Abnormal gait, decreased activity tolerance, decreased balance, decreased endurance, decreased ROM, decreased strength, and pain.    ACTIVITY LIMITATIONS: lifting, standing, squatting, stairs, transfers, dressing, and locomotion level   PARTICIPATION LIMITATIONS: meal prep, cleaning, laundry, and community activity   PERSONAL FACTORS: Age, Fitness, Past/current experiences, and Time since onset of injury/illness/exacerbation are also affecting patient's functional outcome.      GOALS: Goals reviewed with patient? Yes   SHORT TERM GOALS: Target date: 01/09/2023   Patient will be I with initial HEP in order to progress with therapy. Baseline: HEP provided at eval Goal status: INITIAL   2.  Patient will be able to perform sit to stand without UE support to indicate improved LE strength and transfer ability Baseline: patient requires UE to stand Goal status: INITIAL   3.  Patient will report </= 2/10 left hip pain with wallking and activity to reduce functional limitations Baseline: 4/10 Goal status: INITIAL   LONG TERM GOALS: Target date: 02/06/2023   Patient will be I with final HEP to maintain progress from PT. Baseline: HEP provided at eval Goal status: INITIAL   2.  Patient will improve >/= 500 ft using SPC to improve community access Baseline: 205 ft using SPC Goal status: INITIAL   3.  Patient will be able to negotiate 14  steps with railing using reciprocal pattern to improve independence and household mobility Baseline: step-to pattern for 1-2 steps Goal status: INITIAL   4.  Patient will demonstrate left hip strength >/= 4-/5 MMT and knee strength 5/5 MMT to improve walking tolerance Baseline: see limitations noted above Goal status: INITIAL     PLAN: PT FREQUENCY: 1-2x/week   PT DURATION: 8 weeks   PLANNED INTERVENTIONS: Therapeutic exercises, Therapeutic activity, Neuromuscular re-education, Balance training, Gait training, Patient/Family education, Self Care, Joint mobilization, Stair training, Aquatic Therapy, Cryotherapy, Moist heat, Manual therapy, and Re-evaluation   PLAN FOR NEXT SESSION: Review HEP and progress PRN, progress hip and LE strength, gait and balance training       Rosana Hoes, PT, DPT, LAT, ATC 12/31/22  11:42 AM Phone: [phone] Fax: [phone]

## 2022-12-31 ENCOUNTER — Ambulatory Visit: Payer: Medicare Other | Admitting: Physical Therapy

## 2022-12-31 ENCOUNTER — Encounter: Payer: Self-pay | Admitting: Orthopaedic Surgery

## 2022-12-31 ENCOUNTER — Ambulatory Visit (INDEPENDENT_AMBULATORY_CARE_PROVIDER_SITE_OTHER): Payer: Medicare Other | Admitting: Orthopaedic Surgery

## 2022-12-31 ENCOUNTER — Other Ambulatory Visit: Payer: Self-pay

## 2022-12-31 ENCOUNTER — Encounter: Payer: Self-pay | Admitting: Physical Therapy

## 2022-12-31 DIAGNOSIS — M25552 Pain in left hip: Secondary | ICD-10-CM

## 2022-12-31 DIAGNOSIS — Z96642 Presence of left artificial hip joint: Secondary | ICD-10-CM

## 2022-12-31 DIAGNOSIS — R2689 Other abnormalities of gait and mobility: Secondary | ICD-10-CM

## 2022-12-31 DIAGNOSIS — M6281 Muscle weakness (generalized): Secondary | ICD-10-CM

## 2022-12-31 NOTE — Progress Notes (Signed)
The patient is now 6 weeks status post a left total hip arthroplasty.  He is 89 years old.  He says he is doing well getting around.  He is ambulate with a cane.  He is scheduled to go to [facility] for a 70-year reunion soon.  He would like to travel to do that with his son.  He has been wearing compressive socks but he would like to stop wearing this.  He is on Plavix chronically.  His left operative hip moves smoothly and fluidly with no significant discomfort at all.  He is able to perform his ADLs more easily as well.  From my standpoint he can stop the compressive socks.  I will see him back in 3 months and we will have a standing AP pelvis and lateral of his left operative hip at that visit.  If there is issues before then they know to let us know.

## 2023-01-07 ENCOUNTER — Ambulatory Visit: Payer: Medicare Other | Admitting: Physical Therapy

## 2023-01-10 NOTE — Therapy (Signed)
OUTPATIENT PHYSICAL THERAPY TREATMENT NOTE   Patient Name: [name] MRN: [id number] DOB:1928/04/19, 89 y.o., male Today's Date: 01/11/2023  PCP: Stevphen Rochester, MD REFERRING PROVIDER: Kathryne Hitch, MD   END OF SESSION:   PT End of Session - 01/11/23 0902     Visit Number 6    Number of Visits 17    Date for PT Re-Evaluation 02/06/23    Authorization Type MCR    Progress Note Due on Visit 10    PT Start Time 0845    PT Stop Time 0930    PT Time Calculation (min) 45 min    Activity Tolerance Patient tolerated treatment well    Behavior During Therapy WFL for tasks assessed/performed               Past Medical History:  Diagnosis Date   Arthritis    AV BLOCK, 1ST DEGREE    Carotid artery disease (HCC)    a. s/p Left ECA followed by [name]   Coronary artery disease    Coronary artery disease involving coronary bypass graft of native heart with angina pectoris (HCC)    a. LHC 12/2015  3vd Left Cx 100% with colaterals, and distally LAD occluded, DES to prox-mid RCA  b. 02/2017: repeat cath with patent RCA stent and stable CAD; 02/27/2017: Rota-PCI to Ost [place]   GERD (gastroesophageal reflux disease)    HLD (hyperlipidemia)    HTN (hypertension)    Hypothyroidism    Myocardial infarction (HCC)    20 years   NSVT (nonsustained ventricular tachycardia) (HCC)    during stress test 12/2015   Presence of drug coated stent in RCA & Ramus Intermedius. 02/28/2017   12/2015: PCI p-mRCA - Stent Resolute Integ 3.5x34 02/2017: Rota-PCI o-mRamus - overlapping DES STENT SYNERGY DES 3X32  & 3x24 (tapered post-dilation)   Stroke [facility])    Past Surgical History:  Procedure Laterality Date   CARDIAC CATHETERIZATION     x2   CARDIAC CATHETERIZATION N/A 01/11/2016   Procedure: Left Heart Cath and Coronary Angiography;  Surgeon: Iran Ouch, MD;  Location: [facility] INVASIVE CV LAB;  Service: Cardiovascular;  Laterality: N/A;   CAROTID ENDARTERECTOMY  11/28/2006   left    CATARACT EXTRACTION W/ INTRAOCULAR LENS IMPLANT Bilateral    CORONARY ATHERECTOMY N/A 02/27/2017   Procedure: Coronary Atherectomy;  Surgeon: Lennette Bihari, MD;  Location: [facility] INVASIVE CV LAB;  Service: Cardiovascular;  Laterality: N/A;   HERNIA REPAIR     LEFT HEART CATH AND CORONARY ANGIOGRAPHY N/A 02/21/2017   Procedure: Left Heart Cath and Coronary Angiography;  Surgeon: Runell Gess, MD;  Location: [facility] INVASIVE CV LAB;  Service: Cardiovascular;  Laterality: N/A;   TONSILLECTOMY     TOTAL HIP ARTHROPLASTY Left 11/13/2022   Procedure: LEFT TOTAL HIP ARTHROPLASTY ANTERIOR APPROACH;  Surgeon: Kathryne Hitch, MD;  Location: [facility] OR;  Service: Orthopedics;  Laterality: Left;   Patient Active Problem List   Diagnosis Date Noted   Status post total replacement of left hip 11/13/2022   S/P total hip arthroplasty 11/13/2022   History of stroke 04/10/2022   Anemia due to chronic kidney disease 02/06/2022   Macular degeneration 02/06/2022   Multifocal pneumonia 12/17/2020   Pneumonia due to COVID-19 virus 12/17/2020   New onset atrial fibrillation (HCC) 12/17/2020   CKD (chronic kidney disease), stage III (HCC) 12/17/2020   Anemia in chronic kidney disease (CKD) 12/17/2020   Poor balance 10/06/2019   Laryngitis 04/17/2017  Cough 03/29/2017   Acute bronchitis 03/29/2017   Acute upper respiratory infection 03/29/2017   Presence of drug coated stent in RCA & Ramus Intermedius. 02/28/2017   Gout flare 02/22/2017   Coronary artery disease involving native coronary artery of native heart with angina pectoris (HCC)    Carotid artery disease (HCC)    HLD (hyperlipidemia)    Unstable angina (HCC) 02/20/2017   Atherosclerosis of native coronary artery of native heart    Essential hypertension 01/06/2009   Second degree atrioventricular block, Mobitz type I 01/06/2009   AV block, 1st degree 01/06/2009    REFERRING DIAG: Status post total replacement of left hip  THERAPY DIAG:   Pain in left hip  Muscle weakness (generalized)  Other abnormalities of gait and mobility  Rationale for Evaluation and Treatment Rehabilitation  PERTINENT HISTORY: Left THA anterior approach 11/13/2022  PRECAUTIONS: Fall   SUBJECTIVE:                                                                                                                                                                                     SUBJECTIVE STATEMENT:  Patient reports he is doing well. He is feeling a little stiff this morning.   PAIN:  Are you having pain? No:  NPRS scale: 0/10, 2/10 pain with walking and stairs Pain location: Left hip Pain description: Aching Aggravating factors: Walking, stairs Relieving factors: Rest   OBJECTIVE: (objective measures completed at initial evaluation unless otherwise dated) POSTURE:             Rounded shoulders   LOWER EXTREMITY MMT:   MMT Right eval Left eval Left 12/31/2022 Left 01/11/2023  Hip flexion 4 3- 4- 4-  Hip extension 3 2  3-  Hip abduction 4- 2 3- 3-  Hip adduction        Hip internal rotation        Hip external rotation        Knee flexion 5 4    Knee extension 5 4+    Ankle dorsiflexion        Ankle plantarflexion        Ankle inversion        Ankle eversion         (Blank rows = not tested)   FUNCTIONAL TESTS:  Sit to stand: unable to stand without using UE for support  01/11/2023: requires UE assist to stand from standard chair   : 205 ft using SPC   GAIT: Distance walked: 205 ft Assistive device utilized: Single point cane on right Level of assistance: SBA Comments: Decreased gait speed, decreased step length, antalgic on left     TODAY'S  TREATMENT:    [facility] Adult PT Treatment:                                                DATE: 01/11/23 Therapeutic Exercise: Nustep L4 x 5 min with UE/LE while taking subjective SLR 2 x 10 Bridge 2 x 10 Hooklying clamshell with red 2 x 10 Hooklying march with red 2 x 10  each LAQ with 4# 3 x 10 each Sit to stand using UE to rise and no UE to lower 3 x 5 Standing hip abduction and extension 2 x 10 each Forward 4" step-up x 10 each with single UE support Manual: Gentle PROM left hip all directions   [facility] Adult PT Treatment:                                                DATE: 12/31/22 Therapeutic Exercise: Nustep L4 x 5 min with UE/LE while taking subjective Sit to stand without UE from elevated table 3 x 10 - gradually lower table for each set LAQ with 3# 2 x 20 each Standing march with 3# 2 x 2 with UE support  Lateral stepping at counter with yellow at knees 2 x 2 lengths down/back Standing heel toe raise 2 x 20 Forward 4" step-up x 10 each with single UE support  [facility] Adult PT Treatment:                                                DATE: 12/26/22 Therapeutic Exercise: Nustep L2 UE/LE x 5 minutes Standing heel raises  Hip flexion 2# 10 x 2 -cues for TKE with Left WB Hamstring curl 2# 10 x 1 Left STS UE to rise, staggered feet for increased WB left LE  x 10 4 inch step up at sink with bil UE , Lt x 10 Left QS into towel 5 sec x 10 Passive hamstring stretch 3 x 30 sec  Left SLR with cues for initial QS Bridge x 10  [facility] Adult PT Treatment:                                                DATE: 12/24/22 Therapeutic Exercise: LAQ Left x 20 , 2# Clam Green  band seated 10 x 2  Seated March 2 x 10 2#  STS from bariatric chair using UE to rise and no UE to lower x 10  Supine SLR to fatigue x15 , left only, progressive quad lag with reps Bridge  2 x 10 Therapeutic activity:  Gait in clinic with Standard walker    PATIENT EDUCATION:  Education details: HEP Person educated: Patient Education method: Programmer, multimedia, Demonstration, Actor cues, Verbal cues Education comprehension: verbalized understanding, returned demonstration, verbal cues required, tactile cues required, and needs further education   HOME EXERCISE PROGRAM: Access Code: [email or web address]      ASSESSMENT: CLINICAL IMPRESSION: Patient tolerated therapy well with no adverse effects. Therapy focused on progressing hip and LE  strength with good tolerance. He does continue to exhibit gross strength deficits of the left hip but seems to be progressing with his exercises. He does require cueing for upright posture with exercises and he does have some tightness in to hip extension but able to achieve neutral hip extension motion. No changes made to HEP this visit. Patient would benefit from continued skilled PT to progress strength, walking, balance, and general mobility to maximize his independence and functional ability.      OBJECTIVE IMPAIRMENTS: Abnormal gait, decreased activity tolerance, decreased balance, decreased endurance, decreased ROM, decreased strength, and pain.    ACTIVITY LIMITATIONS: lifting, standing, squatting, stairs, transfers, dressing, and locomotion level   PARTICIPATION LIMITATIONS: meal prep, cleaning, laundry, and community activity   PERSONAL FACTORS: Age, Fitness, Past/current experiences, and Time since onset of injury/illness/exacerbation are also affecting patient's functional outcome.      GOALS: Goals reviewed with patient? Yes   SHORT TERM GOALS: Target date: 01/09/2023   Patient will be I with initial HEP in order to progress with therapy. Baseline: HEP provided at eval 01/11/2023: independent Goal status: MET   2.  Patient will be able to perform sit to stand without UE support to indicate improved LE strength and transfer ability Baseline: patient requires UE to stand 01/11/2023: continues to require UE to stand Goal status: ONGOING   3.  Patient will report </= 2/10 left hip pain with wallking and activity to reduce functional limitations Baseline: 4/10 01/11/2023: 2/10 Goal status: MET   LONG TERM GOALS: Target date: 02/06/2023   Patient will be I with final HEP to maintain progress from PT. Baseline: HEP provided at eval Goal status:  INITIAL   2.  Patient will improve >/= 500 ft using SPC to improve community access Baseline: 205 ft using SPC Goal status: INITIAL   3.  Patient will be able to negotiate 14 steps with railing using reciprocal pattern to improve independence and household mobility Baseline: step-to pattern for 1-2 steps Goal status: INITIAL   4.  Patient will demonstrate left hip strength >/= 4-/5 MMT and knee strength 5/5 MMT to improve walking tolerance Baseline: see limitations noted above Goal status: INITIAL     PLAN: PT FREQUENCY: 1-2x/week   PT DURATION: 8 weeks   PLANNED INTERVENTIONS: Therapeutic exercises, Therapeutic activity, Neuromuscular re-education, Balance training, Gait training, Patient/Family education, Self Care, Joint mobilization, Stair training, Aquatic Therapy, Cryotherapy, Moist heat, Manual therapy, and Re-evaluation   PLAN FOR NEXT SESSION: Review HEP and progress PRN, progress hip and LE strength, gait and balance training       Rosana Hoes, PT, DPT, LAT, ATC 01/11/23  9:47 AM Phone: [phone] Fax: [phone]

## 2023-01-11 ENCOUNTER — Encounter: Payer: Self-pay | Admitting: Physical Therapy

## 2023-01-11 ENCOUNTER — Other Ambulatory Visit: Payer: Self-pay

## 2023-01-11 ENCOUNTER — Ambulatory Visit: Payer: Medicare Other | Admitting: Physical Therapy

## 2023-01-11 DIAGNOSIS — R2689 Other abnormalities of gait and mobility: Secondary | ICD-10-CM

## 2023-01-11 DIAGNOSIS — M6281 Muscle weakness (generalized): Secondary | ICD-10-CM

## 2023-01-11 DIAGNOSIS — M25552 Pain in left hip: Secondary | ICD-10-CM | POA: Diagnosis not present

## 2023-01-16 NOTE — Therapy (Signed)
OUTPATIENT PHYSICAL THERAPY TREATMENT NOTE   Patient Name: [name] MRN: [id number] DOB:11/18/1927, 89 y.o., male Today's Date: 01/17/2023  PCP: Stevphen Rochester, MD REFERRING PROVIDER: Kathryne Hitch, MD   END OF SESSION:   PT End of Session - 01/17/23 0900     Visit Number 7    Number of Visits 17    Date for PT Re-Evaluation 02/06/23    Authorization Type MCR    Progress Note Due on Visit 10    PT Start Time 0855    PT Stop Time 0930    PT Time Calculation (min) 35 min    Activity Tolerance Patient tolerated treatment well    Behavior During Therapy WFL for tasks assessed/performed                Past Medical History:  Diagnosis Date   Arthritis    AV BLOCK, 1ST DEGREE    Carotid artery disease (HCC)    a. s/p Left ECA followed by [name]   Coronary artery disease    Coronary artery disease involving coronary bypass graft of native heart with angina pectoris (HCC)    a. LHC 12/2015  3vd Left Cx 100% with colaterals, and distally LAD occluded, DES to prox-mid RCA  b. 02/2017: repeat cath with patent RCA stent and stable CAD; 02/27/2017: Rota-PCI to Ost [place]   GERD (gastroesophageal reflux disease)    HLD (hyperlipidemia)    HTN (hypertension)    Hypothyroidism    Myocardial infarction (HCC)    20 years   NSVT (nonsustained ventricular tachycardia) (HCC)    during stress test 12/2015   Presence of drug coated stent in RCA & Ramus Intermedius. 02/28/2017   12/2015: PCI p-mRCA - Stent Resolute Integ 3.5x34 02/2017: Rota-PCI o-mRamus - overlapping DES STENT SYNERGY DES 3X32  & 3x24 (tapered post-dilation)   Stroke [facility])    Past Surgical History:  Procedure Laterality Date   CARDIAC CATHETERIZATION     x2   CARDIAC CATHETERIZATION N/A 01/11/2016   Procedure: Left Heart Cath and Coronary Angiography;  Surgeon: Iran Ouch, MD;  Location: [facility] INVASIVE CV LAB;  Service: Cardiovascular;  Laterality: N/A;   CAROTID ENDARTERECTOMY  11/28/2006   left    CATARACT EXTRACTION W/ INTRAOCULAR LENS IMPLANT Bilateral    CORONARY ATHERECTOMY N/A 02/27/2017   Procedure: Coronary Atherectomy;  Surgeon: Lennette Bihari, MD;  Location: [facility] INVASIVE CV LAB;  Service: Cardiovascular;  Laterality: N/A;   HERNIA REPAIR     LEFT HEART CATH AND CORONARY ANGIOGRAPHY N/A 02/21/2017   Procedure: Left Heart Cath and Coronary Angiography;  Surgeon: Runell Gess, MD;  Location: [facility] INVASIVE CV LAB;  Service: Cardiovascular;  Laterality: N/A;   TONSILLECTOMY     TOTAL HIP ARTHROPLASTY Left 11/13/2022   Procedure: LEFT TOTAL HIP ARTHROPLASTY ANTERIOR APPROACH;  Surgeon: Kathryne Hitch, MD;  Location: [facility] OR;  Service: Orthopedics;  Laterality: Left;   Patient Active Problem List   Diagnosis Date Noted   Status post total replacement of left hip 11/13/2022   S/P total hip arthroplasty 11/13/2022   History of stroke 04/10/2022   Anemia due to chronic kidney disease 02/06/2022   Macular degeneration 02/06/2022   Multifocal pneumonia 12/17/2020   Pneumonia due to COVID-19 virus 12/17/2020   New onset atrial fibrillation (HCC) 12/17/2020   CKD (chronic kidney disease), stage III (HCC) 12/17/2020   Anemia in chronic kidney disease (CKD) 12/17/2020   Poor balance 10/06/2019   Laryngitis 04/17/2017  Cough 03/29/2017   Acute bronchitis 03/29/2017   Acute upper respiratory infection 03/29/2017   Presence of drug coated stent in RCA & Ramus Intermedius. 02/28/2017   Gout flare 02/22/2017   Coronary artery disease involving native coronary artery of native heart with angina pectoris (HCC)    Carotid artery disease (HCC)    HLD (hyperlipidemia)    Unstable angina (HCC) 02/20/2017   Atherosclerosis of native coronary artery of native heart    Essential hypertension 01/06/2009   Second degree atrioventricular block, Mobitz type I 01/06/2009   AV block, 1st degree 01/06/2009    REFERRING DIAG: Status post total replacement of left hip  THERAPY DIAG:   Pain in left hip  Muscle weakness (generalized)  Other abnormalities of gait and mobility  Rationale for Evaluation and Treatment Rehabilitation  PERTINENT HISTORY: Left THA anterior approach 11/13/2022  PRECAUTIONS: Fall   SUBJECTIVE:                                                                                                                                                                                     SUBJECTIVE STATEMENT:  Patient reports his left leg is feeling pretty heavy today.  PAIN:  Are you having pain? No:  NPRS scale: 0/10, 2/10 pain with walking and stairs Pain location: Left hip Pain description: Aching Aggravating factors: Walking, stairs Relieving factors: Rest   OBJECTIVE: (objective measures completed at initial evaluation unless otherwise dated) POSTURE:             Rounded shoulders   LOWER EXTREMITY MMT:   MMT Right eval Left eval Left 12/31/2022 Left 01/11/2023 Left 01/17/2023  Hip flexion 4 3- 4- 4- 4-  Hip extension 3 2  3- 3-  Hip abduction 4- 2 3- 3- 3-  Hip adduction         Hip internal rotation         Hip external rotation         Knee flexion 5 4     Knee extension 5 4+     Ankle dorsiflexion         Ankle plantarflexion         Ankle inversion         Ankle eversion          (Blank rows = not tested)   FUNCTIONAL TESTS:  Sit to stand: unable to stand without using UE for support  01/11/2023: requires UE assist to stand from standard chair   : 205 ft using SPC   GAIT: Distance walked: 205 ft Assistive device utilized: Single point cane on right Level of assistance: SBA Comments: Decreased gait speed, decreased step  length, antalgic on left     TODAY'S TREATMENT:    [facility] Adult PT Treatment:                                                DATE: 01/17/23 Therapeutic Exercise: Nustep L7 x 5 min with UE/LE while taking subjective SLR 2 x 10 on left Bridge 2 x 10 Hooklying clamshell with green 2 x 15 Hooklying  march with green 2 x 10 each Sidelying hip abduction 2 x 15 with left Side clamshell x 30 Standing march with 2# 2 x 20 Standing hip abduction and extension with 2# 2 x 10 each   [facility] Adult PT Treatment:                                                DATE: 01/11/23 Therapeutic Exercise: Nustep L4 x 5 min with UE/LE while taking subjective SLR 2 x 10 Bridge 2 x 10 Hooklying clamshell with red 2 x 10 Hooklying march with red 2 x 10 each LAQ with 4# 3 x 10 each Sit to stand using UE to rise and no UE to lower 3 x 5 Standing hip abduction and extension 2 x 10 each Forward 4" step-up x 10 each with single UE support Manual: Gentle PROM left hip all directions  [facility] Adult PT Treatment:                                                DATE: 12/31/22 Therapeutic Exercise: Nustep L4 x 5 min with UE/LE while taking subjective Sit to stand without UE from elevated table 3 x 10 - gradually lower table for each set LAQ with 3# 2 x 20 each Standing march with 3# 2 x 2 with UE support  Lateral stepping at counter with yellow at knees 2 x 2 lengths down/back Standing heel toe raise 2 x 20 Forward 4" step-up x 10 each with single UE support  [facility] Adult PT Treatment:                                                DATE: 12/26/22 Therapeutic Exercise: Nustep L2 UE/LE x 5 minutes Standing heel raises  Hip flexion 2# 10 x 2 -cues for TKE with Left WB Hamstring curl 2# 10 x 1 Left STS UE to rise, staggered feet for increased WB left LE  x 10 4 inch step up at sink with bil UE , Lt x 10 Left QS into towel 5 sec x 10 Passive hamstring stretch 3 x 30 sec  Left SLR with cues for initial QS Bridge x 10   PATIENT EDUCATION:  Education details: HEP Person educated: Patient Education method: Programmer, multimedia, Facilities manager, Actor cues, Verbal cues Education comprehension: verbalized understanding, returned demonstration, verbal cues required, tactile cues required, and needs further education   HOME  EXERCISE PROGRAM: Access Code: [email or web address]     ASSESSMENT: CLINICAL IMPRESSION: Patient tolerated therapy well with no adverse  effects. Therapy limited due to patient arriving late. Therapy focused on continued strengthening for the hips and LE with good tolerance. Progressed with weight for standing exercises and progressed mat based hip abductor strengthening. Patient continues to demonstrate gross hip strength deficit. No changes to HEP made this visit. Patient would benefit from continued skilled PT to progress strength, walking, balance, and general mobility to maximize his independence and functional ability.      OBJECTIVE IMPAIRMENTS: Abnormal gait, decreased activity tolerance, decreased balance, decreased endurance, decreased ROM, decreased strength, and pain.    ACTIVITY LIMITATIONS: lifting, standing, squatting, stairs, transfers, dressing, and locomotion level   PARTICIPATION LIMITATIONS: meal prep, cleaning, laundry, and community activity   PERSONAL FACTORS: Age, Fitness, Past/current experiences, and Time since onset of injury/illness/exacerbation are also affecting patient's functional outcome.      GOALS: Goals reviewed with patient? Yes   SHORT TERM GOALS: Target date: 01/09/2023   Patient will be I with initial HEP in order to progress with therapy. Baseline: HEP provided at eval 01/11/2023: independent Goal status: MET   2.  Patient will be able to perform sit to stand without UE support to indicate improved LE strength and transfer ability Baseline: patient requires UE to stand 01/11/2023: continues to require UE to stand Goal status: ONGOING   3.  Patient will report </= 2/10 left hip pain with wallking and activity to reduce functional limitations Baseline: 4/10 01/11/2023: 2/10 Goal status: MET   LONG TERM GOALS: Target date: 02/06/2023   Patient will be I with final HEP to maintain progress from PT. Baseline: HEP provided at eval Goal status: INITIAL   2.   Patient will improve >/= 500 ft using SPC to improve community access Baseline: 205 ft using SPC Goal status: INITIAL   3.  Patient will be able to negotiate 14 steps with railing using reciprocal pattern to improve independence and household mobility Baseline: step-to pattern for 1-2 steps Goal status: INITIAL   4.  Patient will demonstrate left hip strength >/= 4-/5 MMT and knee strength 5/5 MMT to improve walking tolerance Baseline: see limitations noted above Goal status: INITIAL     PLAN: PT FREQUENCY: 1-2x/week   PT DURATION: 8 weeks   PLANNED INTERVENTIONS: Therapeutic exercises, Therapeutic activity, Neuromuscular re-education, Balance training, Gait training, Patient/Family education, Self Care, Joint mobilization, Stair training, Aquatic Therapy, Cryotherapy, Moist heat, Manual therapy, and Re-evaluation   PLAN FOR NEXT SESSION: Review HEP and progress PRN, progress hip and LE strength, gait and balance training       Rosana Hoes, PT, DPT, LAT, ATC 01/17/23  9:32 AM Phone: [phone] Fax: [phone]

## 2023-01-17 ENCOUNTER — Other Ambulatory Visit: Payer: Self-pay

## 2023-01-17 ENCOUNTER — Ambulatory Visit: Payer: Medicare Other | Admitting: Physical Therapy

## 2023-01-17 ENCOUNTER — Encounter: Payer: Self-pay | Admitting: Physical Therapy

## 2023-01-17 DIAGNOSIS — M6281 Muscle weakness (generalized): Secondary | ICD-10-CM

## 2023-01-17 DIAGNOSIS — M25552 Pain in left hip: Secondary | ICD-10-CM

## 2023-01-17 DIAGNOSIS — R2689 Other abnormalities of gait and mobility: Secondary | ICD-10-CM

## 2023-01-21 ENCOUNTER — Ambulatory Visit: Payer: Medicare Other | Attending: Orthopaedic Surgery

## 2023-01-21 DIAGNOSIS — M25552 Pain in left hip: Secondary | ICD-10-CM | POA: Diagnosis present

## 2023-01-21 DIAGNOSIS — M6281 Muscle weakness (generalized): Secondary | ICD-10-CM | POA: Diagnosis present

## 2023-01-21 DIAGNOSIS — R2689 Other abnormalities of gait and mobility: Secondary | ICD-10-CM | POA: Insufficient documentation

## 2023-01-21 NOTE — Therapy (Signed)
OUTPATIENT PHYSICAL THERAPY TREATMENT NOTE   Patient Name: [name] MRN: [id number] DOB:1928/05/04, 89 y.o., male Today's Date: 01/21/2023  PCP: Stevphen Rochester, MD REFERRING PROVIDER: Kathryne Hitch, MD   END OF SESSION:   PT End of Session - 01/21/23 0932     Visit Number 8    Number of Visits 17    Date for PT Re-Evaluation 02/06/23    Authorization Type MCR    Progress Note Due on Visit 10    PT Start Time 0932    PT Stop Time 1015    PT Time Calculation (min) 43 min    Activity Tolerance Patient tolerated treatment well    Behavior During Therapy WFL for tasks assessed/performed                 Past Medical History:  Diagnosis Date   Arthritis    AV BLOCK, 1ST DEGREE    Carotid artery disease (HCC)    a. s/p Left ECA followed by [name]   Coronary artery disease    Coronary artery disease involving coronary bypass graft of native heart with angina pectoris (HCC)    a. LHC 12/2015  3vd Left Cx 100% with colaterals, and distally LAD occluded, DES to prox-mid RCA  b. 02/2017: repeat cath with patent RCA stent and stable CAD; 02/27/2017: Rota-PCI to [place]   GERD (gastroesophageal reflux disease)    HLD (hyperlipidemia)    HTN (hypertension)    Hypothyroidism    Myocardial infarction (HCC)    20 years   NSVT (nonsustained ventricular tachycardia) (HCC)    during stress test 12/2015   Presence of drug coated stent in RCA & Ramus Intermedius. 02/28/2017   12/2015: PCI p-mRCA - Stent Resolute Integ 3.5x34 02/2017: Rota-PCI o-mRamus - overlapping DES STENT SYNERGY DES 3X32  & 3x24 (tapered post-dilation)   Stroke [facility])    Past Surgical History:  Procedure Laterality Date   CARDIAC CATHETERIZATION     x2   CARDIAC CATHETERIZATION N/A 01/11/2016   Procedure: Left Heart Cath and Coronary Angiography;  Surgeon: Iran Ouch, MD;  Location: [facility] INVASIVE CV LAB;  Service: Cardiovascular;  Laterality: N/A;   CAROTID ENDARTERECTOMY  11/28/2006   left    CATARACT EXTRACTION W/ INTRAOCULAR LENS IMPLANT Bilateral    CORONARY ATHERECTOMY N/A 02/27/2017   Procedure: Coronary Atherectomy;  Surgeon: Lennette Bihari, MD;  Location: [facility] INVASIVE CV LAB;  Service: Cardiovascular;  Laterality: N/A;   HERNIA REPAIR     LEFT HEART CATH AND CORONARY ANGIOGRAPHY N/A 02/21/2017   Procedure: Left Heart Cath and Coronary Angiography;  Surgeon: Runell Gess, MD;  Location: [facility] INVASIVE CV LAB;  Service: Cardiovascular;  Laterality: N/A;   TONSILLECTOMY     TOTAL HIP ARTHROPLASTY Left 11/13/2022   Procedure: LEFT TOTAL HIP ARTHROPLASTY ANTERIOR APPROACH;  Surgeon: Kathryne Hitch, MD;  Location: [facility] OR;  Service: Orthopedics;  Laterality: Left;   Patient Active Problem List   Diagnosis Date Noted   Status post total replacement of left hip 11/13/2022   S/P total hip arthroplasty 11/13/2022   History of stroke 04/10/2022   Anemia due to chronic kidney disease 02/06/2022   Macular degeneration 02/06/2022   Multifocal pneumonia 12/17/2020   Pneumonia due to COVID-19 virus 12/17/2020   New onset atrial fibrillation (HCC) 12/17/2020   CKD (chronic kidney disease), stage III (HCC) 12/17/2020   Anemia in chronic kidney disease (CKD) 12/17/2020   Poor balance 10/06/2019   Laryngitis  04/17/2017   Cough 03/29/2017   Acute bronchitis 03/29/2017   Acute upper respiratory infection 03/29/2017   Presence of drug coated stent in RCA & Ramus Intermedius. 02/28/2017   Gout flare 02/22/2017   Coronary artery disease involving native coronary artery of native heart with angina pectoris (HCC)    Carotid artery disease (HCC)    HLD (hyperlipidemia)    Unstable angina (HCC) 02/20/2017   Atherosclerosis of native coronary artery of native heart    Essential hypertension 01/06/2009   Second degree atrioventricular block, Mobitz type I 01/06/2009   AV block, 1st degree 01/06/2009    REFERRING DIAG: Status post total replacement of left hip  THERAPY DIAG:   Pain in left hip  Muscle weakness (generalized)  Other abnormalities of gait and mobility  Rationale for Evaluation and Treatment Rehabilitation  PERTINENT HISTORY: Left THA anterior approach 11/13/2022  PRECAUTIONS: Fall   SUBJECTIVE:                                                                                                                                                                                     SUBJECTIVE STATEMENT:  "The hip doesn't hurt. It's still just weak. Can I go up and down the stairs inside?"  PAIN:  Are you having pain? No    OBJECTIVE: (objective measures completed at initial evaluation unless otherwise dated) POSTURE:             Rounded shoulders   LOWER EXTREMITY MMT:   MMT Right eval Left eval Left 12/31/2022 Left 01/11/2023 Left 01/17/2023  Hip flexion 4 3- 4- 4- 4-  Hip extension 3 2  3- 3-  Hip abduction 4- 2 3- 3- 3-  Hip adduction         Hip internal rotation         Hip external rotation         Knee flexion 5 4     Knee extension 5 4+     Ankle dorsiflexion         Ankle plantarflexion         Ankle inversion         Ankle eversion          (Blank rows = not tested)   FUNCTIONAL TESTS:  Sit to stand: unable to stand without using UE for support  01/11/2023: requires UE assist to stand from standard chair   : 205 ft using SPC   GAIT: Distance walked: 205 ft Assistive device utilized: Single point cane on right Level of assistance: SBA Comments: Decreased gait speed, decreased step length, antalgic on left     TODAY'S TREATMENT:  [facility] Adult PT Treatment:                                                DATE: 01/21/23 Therapeutic Exercise: NuStep level 6 x 5 minutes UE/LE  Standing calf raise 2 x 10  Standing march 2 x 10; single UE support  Standing toe raises 2 x 10  Standing hip abduction 2 x 10  Sit to stand x 5 raised height; x 5 normal height no UE support   Therapeutic Activity: Stair negotiation with  SPC step to pattern with handrail support  Gait training: With SPC x 50 ft working on upright posture and sequencing pattern with [facility]    [facility] Adult PT Treatment:                                                DATE: 01/17/23 Therapeutic Exercise: Nustep L7 x 5 min with UE/LE while taking subjective SLR 2 x 10 on left Bridge 2 x 10 Hooklying clamshell with green 2 x 15 Hooklying march with green 2 x 10 each Sidelying hip abduction 2 x 15 with left Side clamshell x 30 Standing march with 2# 2 x 20 Standing hip abduction and extension with 2# 2 x 10 each   [facility] Adult PT Treatment:                                                DATE: 01/11/23 Therapeutic Exercise: Nustep L4 x 5 min with UE/LE while taking subjective SLR 2 x 10 Bridge 2 x 10 Hooklying clamshell with red 2 x 10 Hooklying march with red 2 x 10 each LAQ with 4# 3 x 10 each Sit to stand using UE to rise and no UE to lower 3 x 5 Standing hip abduction and extension 2 x 10 each Forward 4" step-up x 10 each with single UE support Manual: Gentle PROM left hip all directions  [facility] Adult PT Treatment:                                                DATE: 12/31/22 Therapeutic Exercise: Nustep L4 x 5 min with UE/LE while taking subjective Sit to stand without UE from elevated table 3 x 10 - gradually lower table for each set LAQ with 3# 2 x 20 each Standing march with 3# 2 x 2 with UE support  Lateral stepping at counter with yellow at knees 2 x 2 lengths down/back Standing heel toe raise 2 x 20 Forward 4" step-up x 10 each with single UE support  [facility] Adult PT Treatment:                                                DATE: 12/26/22 Therapeutic Exercise: Nustep L2 UE/LE x 5 minutes Standing heel raises  Hip flexion  2# 10 x 2 -cues for TKE with Left WB Hamstring curl 2# 10 x 1 Left STS UE to rise, staggered feet for increased WB left LE  x 10 4 inch step up at sink with bil UE , Lt x 10 Left QS into towel 5 sec x  10 Passive hamstring stretch 3 x 30 sec  Left SLR with cues for initial QS Bridge x 10   PATIENT EDUCATION:  Education details: HEP review  Person educated: Patient Education method: Explanation Education comprehension: verbalized understanding   HOME EXERCISE PROGRAM: Access Code: [email or web address]     ASSESSMENT: CLINICAL IMPRESSION: Patient tolerated therapy well with no adverse effects. He expressed interest in wanting to go up and down his indoor stairs at home, so worked on Museum/gallery curator today with patient able to complete with SPC and handrail utilizing step to pattern independently without LOB or instability. Was educated on utilizing handrails and AD for stair negotiation at home with patient verbalizing understanding. With gait training utilizing SPC he has difficulty sequencing movement of the cane with step through pattern. He was able to complete sit to stand without use of UE support today, having met this STG. No reports of hip pain throughout session.      OBJECTIVE IMPAIRMENTS: Abnormal gait, decreased activity tolerance, decreased balance, decreased endurance, decreased ROM, decreased strength, and pain.    ACTIVITY LIMITATIONS: lifting, standing, squatting, stairs, transfers, dressing, and locomotion level   PARTICIPATION LIMITATIONS: meal prep, cleaning, laundry, and community activity   PERSONAL FACTORS: Age, Fitness, Past/current experiences, and Time since onset of injury/illness/exacerbation are also affecting patient's functional outcome.      GOALS: Goals reviewed with patient? Yes   SHORT TERM GOALS: Target date: 01/09/2023   Patient will be I with initial HEP in order to progress with therapy. Baseline: HEP provided at eval 01/11/2023: independent Goal status: MET   2.  Patient will be able to perform sit to stand without UE support to indicate improved LE strength and transfer ability Baseline: patient requires UE to stand 01/11/2023: continues to require UE  to stand 01/21/23: able to complete STS without UE support Goal status: met   3.  Patient will report </= 2/10 left hip pain with wallking and activity to reduce functional limitations Baseline: 4/10 01/11/2023: 2/10 Goal status: MET   LONG TERM GOALS: Target date: 02/06/2023   Patient will be I with final HEP to maintain progress from PT. Baseline: HEP provided at eval Goal status: INITIAL   2.  Patient will improve >/= 500 ft using SPC to improve community access Baseline: 205 ft using SPC Goal status: INITIAL   3.  Patient will be able to negotiate 14 steps with railing using reciprocal pattern to improve independence and household mobility Baseline: step-to pattern for 1-2 steps Goal status: INITIAL   4.  Patient will demonstrate left hip strength >/= 4-/5 MMT and knee strength 5/5 MMT to improve walking tolerance Baseline: see limitations noted above Goal status: INITIAL     PLAN: PT FREQUENCY: 1-2x/week   PT DURATION: 8 weeks   PLANNED INTERVENTIONS: Therapeutic exercises, Therapeutic activity, Neuromuscular re-education, Balance training, Gait training, Patient/Family education, Self Care, Joint mobilization, Stair training, Aquatic Therapy, Cryotherapy, Moist heat, Manual therapy, and Re-evaluation   PLAN FOR NEXT SESSION: Review HEP and progress PRN, progress hip and LE strength, gait and balance training     Letitia Libra, PT, DPT, ATC 01/21/23 10:20 AM

## 2023-01-24 ENCOUNTER — Ambulatory Visit: Payer: Medicare Other | Admitting: Physical Therapy

## 2023-01-24 ENCOUNTER — Encounter: Payer: Self-pay | Admitting: Physical Therapy

## 2023-01-24 DIAGNOSIS — R2689 Other abnormalities of gait and mobility: Secondary | ICD-10-CM

## 2023-01-24 DIAGNOSIS — M25552 Pain in left hip: Secondary | ICD-10-CM | POA: Diagnosis not present

## 2023-01-24 DIAGNOSIS — M6281 Muscle weakness (generalized): Secondary | ICD-10-CM

## 2023-01-24 NOTE — Therapy (Signed)
OUTPATIENT PHYSICAL THERAPY TREATMENT NOTE   Patient Name: [name] MRN: [id number] DOB:Aug 17, 1928, 89 y.o., male Today's Date: 01/24/2023  PCP: Stevphen Rochester, MD REFERRING PROVIDER: Kathryne Hitch, MD   END OF SESSION:   PT End of Session - 01/24/23 0902     Visit Number 9    Number of Visits 17    Date for PT Re-Evaluation 02/06/23    Authorization Type MCR    Progress Note Due on Visit 10    PT Start Time 0901   pt arrived late and needed bathroom break   PT Stop Time 0930    PT Time Calculation (min) 29 min                 Past Medical History:  Diagnosis Date   Arthritis    AV BLOCK, 1ST DEGREE    Carotid artery disease (HCC)    a. s/p Left ECA followed by [name]   Coronary artery disease    Coronary artery disease involving coronary bypass graft of native heart with angina pectoris (HCC)    a. LHC 12/2015  3vd Left Cx 100% with colaterals, and distally LAD occluded, DES to prox-mid RCA  b. 02/2017: repeat cath with patent RCA stent and stable CAD; 02/27/2017: Rota-PCI to Ost [place]   GERD (gastroesophageal reflux disease)    HLD (hyperlipidemia)    HTN (hypertension)    Hypothyroidism    Myocardial infarction (HCC)    20 years   NSVT (nonsustained ventricular tachycardia) (HCC)    during stress test 12/2015   Presence of drug coated stent in RCA & Ramus Intermedius. 02/28/2017   12/2015: PCI p-mRCA - Stent Resolute Integ 3.5x34 02/2017: Rota-PCI o-mRamus - overlapping DES STENT SYNERGY DES 3X32  & 3x24 (tapered post-dilation)   Stroke [facility])    Past Surgical History:  Procedure Laterality Date   CARDIAC CATHETERIZATION     x2   CARDIAC CATHETERIZATION N/A 01/11/2016   Procedure: Left Heart Cath and Coronary Angiography;  Surgeon: Iran Ouch, MD;  Location: [facility] INVASIVE CV LAB;  Service: Cardiovascular;  Laterality: N/A;   CAROTID ENDARTERECTOMY  11/28/2006   left   CATARACT EXTRACTION W/ INTRAOCULAR LENS IMPLANT Bilateral    CORONARY  ATHERECTOMY N/A 02/27/2017   Procedure: Coronary Atherectomy;  Surgeon: Lennette Bihari, MD;  Location: [facility] INVASIVE CV LAB;  Service: Cardiovascular;  Laterality: N/A;   HERNIA REPAIR     LEFT HEART CATH AND CORONARY ANGIOGRAPHY N/A 02/21/2017   Procedure: Left Heart Cath and Coronary Angiography;  Surgeon: Runell Gess, MD;  Location: [facility] INVASIVE CV LAB;  Service: Cardiovascular;  Laterality: N/A;   TONSILLECTOMY     TOTAL HIP ARTHROPLASTY Left 11/13/2022   Procedure: LEFT TOTAL HIP ARTHROPLASTY ANTERIOR APPROACH;  Surgeon: Kathryne Hitch, MD;  Location: [facility] OR;  Service: Orthopedics;  Laterality: Left;   Patient Active Problem List   Diagnosis Date Noted   Status post total replacement of left hip 11/13/2022   S/P total hip arthroplasty 11/13/2022   History of stroke 04/10/2022   Anemia due to chronic kidney disease 02/06/2022   Macular degeneration 02/06/2022   Multifocal pneumonia 12/17/2020   Pneumonia due to COVID-19 virus 12/17/2020   New onset atrial fibrillation (HCC) 12/17/2020   CKD (chronic kidney disease), stage III (HCC) 12/17/2020   Anemia in chronic kidney disease (CKD) 12/17/2020   Poor balance 10/06/2019   Laryngitis 04/17/2017   Cough 03/29/2017   Acute bronchitis 03/29/2017  Acute upper respiratory infection 03/29/2017   Presence of drug coated stent in RCA & Ramus Intermedius. 02/28/2017   Gout flare 02/22/2017   Coronary artery disease involving native coronary artery of native heart with angina pectoris (HCC)    Carotid artery disease (HCC)    HLD (hyperlipidemia)    Unstable angina (HCC) 02/20/2017   Atherosclerosis of native coronary artery of native heart    Essential hypertension 01/06/2009   Second degree atrioventricular block, Mobitz type I 01/06/2009   AV block, 1st degree 01/06/2009    REFERRING DIAG: Status post total replacement of left hip  THERAPY DIAG:  Pain in left hip  Muscle weakness (generalized)  Other abnormalities  of gait and mobility  Rationale for Evaluation and Treatment Rehabilitation  PERTINENT HISTORY: Left THA anterior approach 11/13/2022  PRECAUTIONS: Fall   SUBJECTIVE:                                                                                                                                                                                     SUBJECTIVE STATEMENT:  "The hip doesn't hurt. It's still just weak. Can I go up and down the stairs inside?"  PAIN:  Are you having pain? No    OBJECTIVE: (objective measures completed at initial evaluation unless otherwise dated) POSTURE:             Rounded shoulders   LOWER EXTREMITY MMT:   MMT Right eval Left eval Left 12/31/2022 Left 01/11/2023 Left 01/17/2023  Hip flexion 4 3- 4- 4- 4-  Hip extension 3 2  3- 3-  Hip abduction 4- 2 3- 3- 3-  Hip adduction         Hip internal rotation         Hip external rotation         Knee flexion 5 4     Knee extension 5 4+     Ankle dorsiflexion         Ankle plantarflexion         Ankle inversion         Ankle eversion          (Blank rows = not tested)   FUNCTIONAL TESTS:  Sit to stand: unable to stand without using UE for support  01/11/2023: requires UE assist to stand from standard chair   : 205 ft using [facility] 01/24/23: :  254 Ft using SPC    GAIT: Distance walked: 205 ft Assistive device utilized: Single point cane on right Level of assistance: SBA Comments: Decreased gait speed, decreased step length, antalgic on left     TODAY'S TREATMENT:    [facility] Adult PT Treatment:  DATE: 01/24/23 Therapeutic Exercise: 2# marching  x 10 each  2# heel raises  x 10  2# hip abduction x 10 each   Therapeutic Activity: 254 feet with SPC Gait 185 feet with RW (pt with improved step length and upright posture)     [facility] Adult PT Treatment:                                                DATE: 01/21/23 Therapeutic Exercise: NuStep  level 6 x 5 minutes UE/LE  Standing calf raise 2 x 10  Standing march 2 x 10; single UE support  Standing toe raises 2 x 10  Standing hip abduction 2 x 10  Sit to stand x 5 raised height; x 5 normal height no UE support   Therapeutic Activity: Stair negotiation with SPC step to pattern with handrail support  Gait training: With SPC x 50 ft working on upright posture and sequencing pattern with [facility]    [facility] Adult PT Treatment:                                                DATE: 01/17/23 Therapeutic Exercise: Nustep L7 x 5 min with UE/LE while taking subjective SLR 2 x 10 on left Bridge 2 x 10 Hooklying clamshell with green 2 x 15 Hooklying march with green 2 x 10 each Sidelying hip abduction 2 x 15 with left Side clamshell x 30 Standing march with 2# 2 x 20 Standing hip abduction and extension with 2# 2 x 10 each   [facility] Adult PT Treatment:                                                DATE: 01/11/23 Therapeutic Exercise: Nustep L4 x 5 min with UE/LE while taking subjective SLR 2 x 10 Bridge 2 x 10 Hooklying clamshell with red 2 x 10 Hooklying march with red 2 x 10 each LAQ with 4# 3 x 10 each Sit to stand using UE to rise and no UE to lower 3 x 5 Standing hip abduction and extension 2 x 10 each Forward 4" step-up x 10 each with single UE support Manual: Gentle PROM left hip all directions  [facility] Adult PT Treatment:                                                DATE: 12/31/22 Therapeutic Exercise: Nustep L4 x 5 min with UE/LE while taking subjective Sit to stand without UE from elevated table 3 x 10 - gradually lower table for each set LAQ with 3# 2 x 20 each Standing march with 3# 2 x 2 with UE support  Lateral stepping at counter with yellow at knees 2 x 2 lengths down/back Standing heel toe raise 2 x 20 Forward 4" step-up x 10 each with single UE support  [facility] Adult PT Treatment:  DATE: 12/26/22 Therapeutic  Exercise: Nustep L2 UE/LE x 5 minutes Standing heel raises  Hip flexion 2# 10 x 2 -cues for TKE with Left WB Hamstring curl 2# 10 x 1 Left STS UE to rise, staggered feet for increased WB left LE  x 10 4 inch step up at sink with bil UE , Lt x 10 Left QS into towel 5 sec x 10 Passive hamstring stretch 3 x 30 sec  Left SLR with cues for initial QS Bridge x 10   PATIENT EDUCATION:  Education details: HEP review  Person educated: Patient Education method: Explanation Education comprehension: verbalized understanding   HOME EXERCISE PROGRAM: Access Code: [email or web address]     ASSESSMENT: CLINICAL IMPRESSION: Patient tolerated therapy well with no adverse effects. Repeated with SPC as completed on evaluation noting improvement from 205 feet to 254 feet. He intermittently has trouble with sequencing the [facility] and requires cues to increase step length and avoid shuffling feet.  Trial of Front wheel RW today and he was able to complete a more fluid gait pattern with consistently improved step length and upright posture. Patient was given info in front wheel RW to show his family.  No reports of hip pain throughout session.      OBJECTIVE IMPAIRMENTS: Abnormal gait, decreased activity tolerance, decreased balance, decreased endurance, decreased ROM, decreased strength, and pain.    ACTIVITY LIMITATIONS: lifting, standing, squatting, stairs, transfers, dressing, and locomotion level   PARTICIPATION LIMITATIONS: meal prep, cleaning, laundry, and community activity   PERSONAL FACTORS: Age, Fitness, Past/current experiences, and Time since onset of injury/illness/exacerbation are also affecting patient's functional outcome.      GOALS: Goals reviewed with patient? Yes   SHORT TERM GOALS: Target date: 01/09/2023   Patient will be I with initial HEP in order to progress with therapy. Baseline: HEP provided at eval 01/11/2023: independent Goal status: MET   2.  Patient will be able to perform  sit to stand without UE support to indicate improved LE strength and transfer ability Baseline: patient requires UE to stand 01/11/2023: continues to require UE to stand 01/21/23: able to complete STS without UE support Goal status: met   3.  Patient will report </= 2/10 left hip pain with wallking and activity to reduce functional limitations Baseline: 4/10 01/11/2023: 2/10 Goal status: MET   LONG TERM GOALS: Target date: 02/06/2023   Patient will be I with final HEP to maintain progress from PT. Baseline: HEP provided at eval Goal status: INITIAL   2.  Patient will improve >/= 500 ft using SPC to improve community access Baseline: 205 ft using SPC Goal status: INITIAL   3.  Patient will be able to negotiate 14 steps with railing using reciprocal pattern to improve independence and household mobility Baseline: step-to pattern for 1-2 steps Goal status: INITIAL   4.  Patient will demonstrate left hip strength >/= 4-/5 MMT and knee strength 5/5 MMT to improve walking tolerance Baseline: see limitations noted above Goal status: INITIAL     PLAN: PT FREQUENCY: 1-2x/week   PT DURATION: 8 weeks   PLANNED INTERVENTIONS: Therapeutic exercises, Therapeutic activity, Neuromuscular re-education, Balance training, Gait training, Patient/Family education, Self Care, Joint mobilization, Stair training, Aquatic Therapy, Cryotherapy, Moist heat, Manual therapy, and Re-evaluation   PLAN FOR NEXT SESSION: Review HEP and progress PRN, progress hip and LE strength, gait and balance training     Jannette Spanner, PTA 01/24/23 11:00 AM Phone: [phone] Fax: [phone]

## 2023-01-25 NOTE — Therapy (Signed)
OUTPATIENT PHYSICAL THERAPY TREATMENT NOTE   Progress Note Reporting Period 12/12/2022 to 01/28/2023  See note below for Objective Data and Assessment of Progress/Goals.     Patient Name: [name] MRN: [id number] DOB:[birth year], 89 y.o., male Today's Date: 01/28/2023  PCP: Stevphen Rochester, MD REFERRING PROVIDER: Kathryne Hitch, MD   END OF SESSION:   PT End of Session - 01/28/23 0946     Visit Number 10    Number of Visits 18    Date for PT Re-Evaluation 03/11/23    Authorization Type MCR    Progress Note Due on Visit 20    PT Start Time 0940    PT Stop Time 1018    PT Time Calculation (min) 38 min    Activity Tolerance Patient tolerated treatment well    Behavior During Therapy WFL for tasks assessed/performed                  Past Medical History:  Diagnosis Date   Arthritis    AV BLOCK, 1ST DEGREE    Carotid artery disease (HCC)    a. s/p Left ECA followed by [name]   Coronary artery disease    Coronary artery disease involving coronary bypass graft of native heart with angina pectoris (HCC)    a. LHC 12/2015  3vd Left Cx 100% with colaterals, and distally LAD occluded, DES to prox-mid RCA  b. 02/2017: repeat cath with patent RCA stent and stable CAD; 02/27/2017: Rota-PCI to [place]   GERD (gastroesophageal reflux disease)    HLD (hyperlipidemia)    HTN (hypertension)    Hypothyroidism    Myocardial infarction (HCC)    20 years   NSVT (nonsustained ventricular tachycardia) (HCC)    during stress test 12/2015   Presence of drug coated stent in RCA & Ramus Intermedius. 02/28/2017   12/2015: PCI p-mRCA - Stent Resolute Integ 3.5x34 02/2017: Rota-PCI o-mRamus - overlapping DES STENT SYNERGY DES 3X32  & 3x24 (tapered post-dilation)   Stroke [facility])    Past Surgical History:  Procedure Laterality Date   CARDIAC CATHETERIZATION     x2   CARDIAC CATHETERIZATION N/A 01/11/2016   Procedure: Left Heart Cath and Coronary Angiography;  Surgeon:  Iran Ouch, MD;  Location: [facility] INVASIVE CV LAB;  Service: Cardiovascular;  Laterality: N/A;   CAROTID ENDARTERECTOMY  11/28/2006   left   CATARACT EXTRACTION W/ INTRAOCULAR LENS IMPLANT Bilateral    CORONARY ATHERECTOMY N/A 02/27/2017   Procedure: Coronary Atherectomy;  Surgeon: Lennette Bihari, MD;  [facility] INVASIVE CV LAB;  Service: Cardiovascular;  Laterality: N/A;   HERNIA REPAIR     LEFT HEART CATH AND CORONARY ANGIOGRAPHY N/A 02/21/2017   Procedure: Left Heart Cath and Coronary Angiography;  Surgeon: Runell Gess, MD;  Location: [facility] INVASIVE CV LAB;  Service: Cardiovascular;  Laterality: N/A;   TONSILLECTOMY     TOTAL HIP ARTHROPLASTY Left 11/13/2022   Procedure: LEFT TOTAL HIP ARTHROPLASTY ANTERIOR APPROACH;  Surgeon: Kathryne Hitch, MD;  Location: [facility] OR;  Service: Orthopedics;  Laterality: Left;   Patient Active Problem List   Diagnosis Date Noted   Status post total replacement of left hip 11/13/2022   S/P total hip arthroplasty 11/13/2022   History of stroke 04/10/2022   Anemia due to chronic kidney disease 02/06/2022   Macular degeneration 02/06/2022   Multifocal pneumonia 12/17/2020   Pneumonia due to COVID-19 virus 12/17/2020   New onset atrial fibrillation (HCC) 12/17/2020   CKD (chronic  kidney disease), stage III (HCC) 12/17/2020   Anemia in chronic kidney disease (CKD) 12/17/2020   Poor balance 10/06/2019   Laryngitis 04/17/2017   Cough 03/29/2017   Acute bronchitis 03/29/2017   Acute upper respiratory infection 03/29/2017   Presence of drug coated stent in RCA & Ramus Intermedius. 02/28/2017   Gout flare 02/22/2017   Coronary artery disease involving native coronary artery of native heart with angina pectoris (HCC)    Carotid artery disease (HCC)    HLD (hyperlipidemia)    Unstable angina (HCC) 02/20/2017   Atherosclerosis of native coronary artery of native heart    Essential hypertension 01/06/2009   Second degree atrioventricular block,  Mobitz type I 01/06/2009   AV block, 1st degree 01/06/2009    REFERRING DIAG: Status post total replacement of left hip  THERAPY DIAG:  Pain in left hip  Muscle weakness (generalized)  Other abnormalities of gait and mobility  Rationale for Evaluation and Treatment Rehabilitation  PERTINENT HISTORY: Left THA anterior approach 11/13/2022  PRECAUTIONS: Fall   SUBJECTIVE:                                                                                                                                                                                     SUBJECTIVE STATEMENT:  Patient reports he is doing well. He feels he continues to improve and has been going up and down stairs and walking more.   PAIN:  Are you having pain? No   OBJECTIVE: (objective measures completed at initial evaluation unless otherwise dated) POSTURE:             Rounded shoulders   LOWER EXTREMITY MMT:   MMT Right eval Left eval Left 12/31/2022 Left 01/11/2023 Left 01/17/2023 Left 01/28/2023  Hip flexion 4 3- 4- 4- 4- 4-  Hip extension 3 2  3- 3- 3-  Hip abduction 4- 2 3- 3- 3- 3-  Hip adduction          Hip internal rotation          Hip external rotation          Knee flexion 5 4      Knee extension 5 4+      Ankle dorsiflexion          Ankle plantarflexion          Ankle inversion          Ankle eversion           (Blank rows = not tested)   FUNCTIONAL TESTS:  Sit to stand: unable to stand without using UE for support  01/11/2023: requires UE assist to stand from standard chair 01/28/2023: able  to perform sit to stand without UE support   : 205 ft using [facility] 01/24/23: 254 Ft using SPC    GAIT: Distance walked: 205 ft Assistive device utilized: Single point cane on right Level of assistance: SBA Comments: Decreased gait speed, decreased step length, antalgic on left     TODAY'S TREATMENT:    [facility] Adult PT Treatment:                                                DATE:  01/28/23 Therapeutic Exercise: NuStep L7 x 5 minutes UE/LE while taking subjective Sit to stand from standard chair without UE support 2 x 5 Standing heel toe raise x 10  Standing march x 20 with single UE support  Standing hip abduction x 10 each Therapeutic Activity: Stair negotiation with SPC on right and handrail on left, reciprocal when ascending stairs and step-to when descending stairs 3 x 5 up/down Gait training: With SPC x 150 ft working on upright posture and sequencing pattern with SPC, step-through gait pattern   [facility] Adult PT Treatment:                                                DATE: 01/24/23 Therapeutic Exercise: 2# marching  x 10 each  2# heel raises  x 10  2# hip abduction x 10 each  Therapeutic Activity: 254 feet with SPC Gait 185 feet with RW (pt with improved step length and upright posture)   [facility] Adult PT Treatment:                                                DATE: 01/21/23 Therapeutic Exercise: NuStep level 6 x 5 minutes UE/LE  Standing calf raise 2 x 10  Standing march 2 x 10; single UE support  Standing toe raises 2 x 10  Standing hip abduction 2 x 10  Sit to stand x 5 raised height; x 5 normal height no UE support  Therapeutic Activity: Stair negotiation with SPC step to pattern with handrail support Gait training: With SPC x 50 ft working on upright posture and sequencing pattern with [facility]   PATIENT EDUCATION:  Education details: POC extension, HEP, front wheel attachment for his standard walker Person educated: Patient Education method: Explanation, Handout Education comprehension: verbalized understanding   HOME EXERCISE PROGRAM: Access Code: [email or web address]     ASSESSMENT: CLINICAL IMPRESSION: Patient tolerated therapy well with no adverse effects. He is progressing well in therapy and demonstrates improved ability to perform sit to stand without UE support and is able to negotiate stairs much better indicating an improvement in his strength  and functional ability. He does continue to exhibit gross hip strength deficits and decrease control while descending stairs so does use step-to pattern. He continues to exhibit greater instability with gait using a SPC so patient instructed to continue using a walker for community ambulation and he was provided resources to purchase front wheel attachments for his standard walker as he exhibits improved gait while using a rolling walker rather than standard walker. No changes made to HEP  this visit. Patient would benefit from continued skilled PT to progress strength, walking, balance, and general mobility to maximize his independence and functional ability, so will extended PT POC for 6 more weeks.      OBJECTIVE IMPAIRMENTS: Abnormal gait, decreased activity tolerance, decreased balance, decreased endurance, decreased ROM, decreased strength, and pain.    ACTIVITY LIMITATIONS: lifting, standing, squatting, stairs, transfers, dressing, and locomotion level   PARTICIPATION LIMITATIONS: meal prep, cleaning, laundry, and community activity   PERSONAL FACTORS: Age, Fitness, Past/current experiences, and Time since onset of injury/illness/exacerbation are also affecting patient's functional outcome.      GOALS: Goals reviewed with patient? Yes   SHORT TERM GOALS: Target date: 01/09/2023   Patient will be I with initial HEP in order to progress with therapy. Baseline: HEP provided at eval 01/11/2023: independent Goal status: MET   2.  Patient will be able to perform sit to stand without UE support to indicate improved LE strength and transfer ability Baseline: patient requires UE to stand 01/11/2023: continues to require UE to stand 01/21/23: able to complete STS without UE support Goal status: MET   3.  Patient will report </= 2/10 left hip pain with wallking and activity to reduce functional limitations Baseline: 4/10 01/11/2023: 2/10 Goal status: MET   LONG TERM GOALS: Target date:  03/11/2023   Patient will be I with final HEP to maintain progress from PT. Baseline: HEP provided at eval 01/28/2023: progressing Goal status: ONGOING   2.  Patient will improve >/= 500 ft using SPC to improve community access Baseline: 205 ft using [facility] 01/24/2023: 254 ft using SPC Goal status: ONGOING   3.  Patient will be able to negotiate 14 steps with railing using reciprocal pattern to improve independence and household mobility Baseline: step-to pattern for 1-2 steps 01/28/2023: uses step-to pattern when descending stairs Goal status: ONGOING   4.  Patient will demonstrate left hip strength >/= 4-/5 MMT and knee strength 5/5 MMT to improve walking tolerance Baseline: see limitations noted above 01/28/2023: strength limitations noted above Goal status: ONGOING     PLAN: PT FREQUENCY: 1-2x/week   PT DURATION: 6 weeks   PLANNED INTERVENTIONS: Therapeutic exercises, Therapeutic activity, Neuromuscular re-education, Balance training, Gait training, Patient/Family education, Self Care, Joint mobilization, Stair training, Aquatic Therapy, Cryotherapy, Moist heat, Manual therapy, and Re-evaluation   PLAN FOR NEXT SESSION: Review HEP and progress PRN, progress hip and LE strength, gait and balance training     Rosana Hoes, PT, DPT, LAT, ATC 01/28/23  10:31 AM Phone: [phone] Fax: [phone]

## 2023-01-28 ENCOUNTER — Other Ambulatory Visit: Payer: Self-pay

## 2023-01-28 ENCOUNTER — Encounter: Payer: Self-pay | Admitting: Physical Therapy

## 2023-01-28 ENCOUNTER — Ambulatory Visit: Payer: Medicare Other | Admitting: Physical Therapy

## 2023-01-28 DIAGNOSIS — M25552 Pain in left hip: Secondary | ICD-10-CM | POA: Diagnosis not present

## 2023-01-28 DIAGNOSIS — M6281 Muscle weakness (generalized): Secondary | ICD-10-CM

## 2023-01-28 DIAGNOSIS — R2689 Other abnormalities of gait and mobility: Secondary | ICD-10-CM

## 2023-01-31 ENCOUNTER — Encounter: Payer: Self-pay | Admitting: Physical Therapy

## 2023-01-31 ENCOUNTER — Ambulatory Visit: Payer: Medicare Other | Admitting: Physical Therapy

## 2023-01-31 DIAGNOSIS — M25552 Pain in left hip: Secondary | ICD-10-CM | POA: Diagnosis not present

## 2023-01-31 DIAGNOSIS — M6281 Muscle weakness (generalized): Secondary | ICD-10-CM

## 2023-01-31 NOTE — Therapy (Signed)
OUTPATIENT PHYSICAL THERAPY TREATMENT NOTE      Patient Name: [name] MRN: [id number] DOB:[birth year], 89 y.o., male Today's Date: 01/31/2023  PCP: Stevphen Rochester, MD REFERRING PROVIDER: Kathryne Hitch, MD   END OF SESSION:   PT End of Session - 01/31/23 0938     Visit Number 11    Number of Visits 18    Date for PT Re-Evaluation 03/11/23    Authorization Type MCR    Progress Note Due on Visit 20    PT Start Time 0935    PT Stop Time 1015    PT Time Calculation (min) 40 min                  Past Medical History:  Diagnosis Date   Arthritis    AV BLOCK, 1ST DEGREE    Carotid artery disease (HCC)    a. s/p Left ECA followed by [name]   Coronary artery disease    Coronary artery disease involving coronary bypass graft of native heart with angina pectoris (HCC)    a. LHC 12/2015  3vd Left Cx 100% with colaterals, and distally LAD occluded, DES to prox-mid RCA  b. 02/2017: repeat cath with patent RCA stent and stable CAD; 02/27/2017: Rota-PCI to Ost [place]   GERD (gastroesophageal reflux disease)    HLD (hyperlipidemia)    HTN (hypertension)    Hypothyroidism    Myocardial infarction (HCC)    20 years   NSVT (nonsustained ventricular tachycardia) (HCC)    during stress test 12/2015   Presence of drug coated stent in RCA & Ramus Intermedius. 02/28/2017   12/2015: PCI p-mRCA - Stent Resolute Integ 3.5x34 02/2017: Rota-PCI o-mRamus - overlapping DES STENT SYNERGY DES 3X32  & 3x24 (tapered post-dilation)   Stroke [facility])    Past Surgical History:  Procedure Laterality Date   CARDIAC CATHETERIZATION     x2   CARDIAC CATHETERIZATION N/A 01/11/2016   Procedure: Left Heart Cath and Coronary Angiography;  Surgeon: Iran Ouch, MD;  Location: [facility] INVASIVE CV LAB;  Service: Cardiovascular;  Laterality: N/A;   CAROTID ENDARTERECTOMY  11/28/2006   left   CATARACT EXTRACTION W/ INTRAOCULAR LENS IMPLANT Bilateral    CORONARY ATHERECTOMY N/A 02/27/2017    Procedure: Coronary Atherectomy;  Surgeon: Lennette Bihari, MD;  Location: [facility] INVASIVE CV LAB;  Service: Cardiovascular;  Laterality: N/A;   HERNIA REPAIR     LEFT HEART CATH AND CORONARY ANGIOGRAPHY N/A 02/21/2017   Procedure: Left Heart Cath and Coronary Angiography;  Surgeon: Runell Gess, MD;  Location: [facility] INVASIVE CV LAB;  Service: Cardiovascular;  Laterality: N/A;   TONSILLECTOMY     TOTAL HIP ARTHROPLASTY Left 11/13/2022   Procedure: LEFT TOTAL HIP ARTHROPLASTY ANTERIOR APPROACH;  Surgeon: Kathryne Hitch, MD;  Location: [facility] OR;  Service: Orthopedics;  Laterality: Left;   Patient Active Problem List   Diagnosis Date Noted   Status post total replacement of left hip 11/13/2022   S/P total hip arthroplasty 11/13/2022   History of stroke 04/10/2022   Anemia due to chronic kidney disease 02/06/2022   Macular degeneration 02/06/2022   Multifocal pneumonia 12/17/2020   Pneumonia due to COVID-19 virus 12/17/2020   New onset atrial fibrillation (HCC) 12/17/2020   CKD (chronic kidney disease), stage III (HCC) 12/17/2020   Anemia in chronic kidney disease (CKD) 12/17/2020   Poor balance 10/06/2019   Laryngitis 04/17/2017   Cough 03/29/2017   Acute bronchitis 03/29/2017   Acute upper respiratory  infection 03/29/2017   Presence of drug coated stent in RCA & Ramus Intermedius. 02/28/2017   Gout flare 02/22/2017   Coronary artery disease involving native coronary artery of native heart with angina pectoris (HCC)    Carotid artery disease (HCC)    HLD (hyperlipidemia)    Unstable angina (HCC) 02/20/2017   Atherosclerosis of native coronary artery of native heart    Essential hypertension 01/06/2009   Second degree atrioventricular block, Mobitz type I 01/06/2009   AV block, 1st degree 01/06/2009    REFERRING DIAG: Status post total replacement of left hip  THERAPY DIAG:  Pain in left hip  Muscle weakness (generalized)  Rationale for Evaluation and Treatment  Rehabilitation  PERTINENT HISTORY: Left THA anterior approach 11/13/2022  PRECAUTIONS: Fall   SUBJECTIVE:                                                                                                                                                                                     SUBJECTIVE STATEMENT:  Patient reports he is doing well. He feels he continues to improve and has been going up and down stairs and walking more.   PAIN:  Are you having pain? No   OBJECTIVE: (objective measures completed at initial evaluation unless otherwise dated) POSTURE:             Rounded shoulders   LOWER EXTREMITY MMT:   MMT Right eval Left eval Left 12/31/2022 Left 01/11/2023 Left 01/17/2023 Left 01/28/2023  Hip flexion 4 3- 4- 4- 4- 4-  Hip extension 3 2  3- 3- 3-  Hip abduction 4- 2 3- 3- 3- 3-  Hip adduction          Hip internal rotation          Hip external rotation          Knee flexion 5 4      Knee extension 5 4+      Ankle dorsiflexion          Ankle plantarflexion          Ankle inversion          Ankle eversion           (Blank rows = not tested)   FUNCTIONAL TESTS:  Sit to stand: unable to stand without using UE for support  01/11/2023: requires UE assist to stand from standard chair 01/28/2023: able to perform sit to stand without UE support   : 205 ft using [facility] 01/24/23: 254 Ft using SPC    GAIT: Distance walked: 205 ft Assistive device utilized: Single point cane on right Level of assistance: SBA Comments: Decreased gait speed, decreased  step length, antalgic on left     TODAY'S TREATMENT:    [facility] Adult PT Treatment:                                                DATE: 01/31/23 Therapeutic Exercise: Nustep L7 x 5 minutes UE/LE  STS x 10  Heel raises x 20 2# hip abduction 10 x 2 each 2# marching  x 10 each  6 inch left step up x 10  bil UE, x 10 single UE 6 inch right step up x 10, 1 UE Seated LAQ 2# x 20, left Tandem stance trials - intermittently  able to release UE  Therapeutic Activity: Side stepping in parallel bars light touch x 10 feet  Gait with RW- cues for step length and posture   [facility] Adult PT Treatment:                                                DATE: 01/28/23 Therapeutic Exercise: NuStep L7 x 5 minutes UE/LE while taking subjective Sit to stand from standard chair without UE support 2 x 5 Standing heel toe raise x 10  Standing march x 20 with single UE support  Standing hip abduction x 10 each Therapeutic Activity: Stair negotiation with SPC on right and handrail on left, reciprocal when ascending stairs and step-to when descending stairs 3 x 5 up/down Gait training: With SPC x 150 ft working on upright posture and sequencing pattern with SPC, step-through gait pattern   [facility] Adult PT Treatment:                                                DATE: 01/24/23 Therapeutic Exercise: 2# marching  x 10 each  2# heel raises  x 10  2# hip abduction x 10 each  Therapeutic Activity: 254 feet with SPC Gait 185 feet with RW (pt with improved step length and upright posture)   [facility] Adult PT Treatment:                                                DATE: 01/21/23 Therapeutic Exercise: NuStep level 6 x 5 minutes UE/LE  Standing calf raise 2 x 10  Standing march 2 x 10; single UE support  Standing toe raises 2 x 10  Standing hip abduction 2 x 10  Sit to stand x 5 raised height; x 5 normal height no UE support  Therapeutic Activity: Stair negotiation with SPC step to pattern with handrail support Gait training: With SPC x 50 ft working on upright posture and sequencing pattern with [facility]   PATIENT EDUCATION:  Education details: POC extension, HEP, front wheel attachment for his standard walker Person educated: Patient Education method: Explanation, Handout Education comprehension: verbalized understanding   HOME EXERCISE PROGRAM: Access Code: [email or web address]     ASSESSMENT: CLINICAL IMPRESSION: Patient tolerated  therapy well with no adverse effects. He arrives with FW RW and demonstrates improved  step length. Cues given for posture. Continued with LE strengthening in mostly closed chain position with rest breaks as needed. He tolerated session well.  Patient would benefit from continued skilled PT to progress strength, walking, balance, and general mobility to maximize his independence and functional ability.   OBJECTIVE IMPAIRMENTS: Abnormal gait, decreased activity tolerance, decreased balance, decreased endurance, decreased ROM, decreased strength, and pain.    ACTIVITY LIMITATIONS: lifting, standing, squatting, stairs, transfers, dressing, and locomotion level   PARTICIPATION LIMITATIONS: meal prep, cleaning, laundry, and community activity   PERSONAL FACTORS: Age, Fitness, Past/current experiences, and Time since onset of injury/illness/exacerbation are also affecting patient's functional outcome.      GOALS: Goals reviewed with patient? Yes   SHORT TERM GOALS: Target date: 01/09/2023   Patient will be I with initial HEP in order to progress with therapy. Baseline: HEP provided at eval 01/11/2023: independent Goal status: MET   2.  Patient will be able to perform sit to stand without UE support to indicate improved LE strength and transfer ability Baseline: patient requires UE to stand 01/11/2023: continues to require UE to stand 01/21/23: able to complete STS without UE support Goal status: MET   3.  Patient will report </= 2/10 left hip pain with wallking and activity to reduce functional limitations Baseline: 4/10 01/11/2023: 2/10 Goal status: MET   LONG TERM GOALS: Target date: 03/11/2023   Patient will be I with final HEP to maintain progress from PT. Baseline: HEP provided at eval 01/28/2023: progressing Goal status: ONGOING   2.  Patient will improve >/= 500 ft using SPC to improve community access Baseline: 205 ft using [facility] 01/24/2023: 254 ft using SPC Goal status:  ONGOING   3.  Patient will be able to negotiate 14 steps with railing using reciprocal pattern to improve independence and household mobility Baseline: step-to pattern for 1-2 steps 01/28/2023: uses step-to pattern when descending stairs Goal status: ONGOING   4.  Patient will demonstrate left hip strength >/= 4-/5 MMT and knee strength 5/5 MMT to improve walking tolerance Baseline: see limitations noted above 01/28/2023: strength limitations noted above Goal status: ONGOING     PLAN: PT FREQUENCY: 1-2x/week   PT DURATION: 6 weeks   PLANNED INTERVENTIONS: Therapeutic exercises, Therapeutic activity, Neuromuscular re-education, Balance training, Gait training, Patient/Family education, Self Care, Joint mobilization, Stair training, Aquatic Therapy, Cryotherapy, Moist heat, Manual therapy, and Re-evaluation   PLAN FOR NEXT SESSION: Review HEP and progress PRN, progress hip and LE strength, gait and balance training     Jannette Spanner, PTA 01/31/23 12:07 PM Phone: [phone] Fax: [phone]

## 2023-02-04 ENCOUNTER — Ambulatory Visit: Payer: Medicare Other | Admitting: Physical Therapy

## 2023-02-04 ENCOUNTER — Other Ambulatory Visit: Payer: Self-pay

## 2023-02-04 ENCOUNTER — Encounter: Payer: Self-pay | Admitting: Physical Therapy

## 2023-02-04 DIAGNOSIS — M25552 Pain in left hip: Secondary | ICD-10-CM | POA: Diagnosis not present

## 2023-02-04 DIAGNOSIS — M6281 Muscle weakness (generalized): Secondary | ICD-10-CM

## 2023-02-04 DIAGNOSIS — R2689 Other abnormalities of gait and mobility: Secondary | ICD-10-CM

## 2023-02-04 NOTE — Therapy (Signed)
OUTPATIENT PHYSICAL THERAPY TREATMENT NOTE      Patient Name: [name] MRN: [id number] DOB:1928/04/17, 89 y.o., male Today's Date: 02/04/2023  PCP: Stevphen Rochester, MD REFERRING PROVIDER: Kathryne Hitch, MD   END OF SESSION:   PT End of Session - 02/04/23 0945     Visit Number 12    Number of Visits 18    Date for PT Re-Evaluation 03/11/23    Authorization Type MCR    Progress Note Due on Visit 20    PT Start Time 0933    PT Stop Time 1015    PT Time Calculation (min) 42 min    Activity Tolerance Patient tolerated treatment well    Behavior During Therapy WFL for tasks assessed/performed                   Past Medical History:  Diagnosis Date   Arthritis    AV BLOCK, 1ST DEGREE    Carotid artery disease (HCC)    a. s/p Left ECA followed by [name]   Coronary artery disease    Coronary artery disease involving coronary bypass graft of native heart with angina pectoris (HCC)    a. LHC 12/2015  3vd Left Cx 100% with colaterals, and distally LAD occluded, DES to prox-mid RCA  b. 02/2017: repeat cath with patent RCA stent and stable CAD; 02/27/2017: Rota-PCI to [place]   GERD (gastroesophageal reflux disease)    HLD (hyperlipidemia)    HTN (hypertension)    Hypothyroidism    Myocardial infarction (HCC)    20 years   NSVT (nonsustained ventricular tachycardia) (HCC)    during stress test 12/2015   Presence of drug coated stent in RCA & Ramus Intermedius. 02/28/2017   12/2015: PCI p-mRCA - Stent Resolute Integ 3.5x34 02/2017: Rota-PCI o-mRamus - overlapping DES STENT SYNERGY DES 3X32  & 3x24 (tapered post-dilation)   Stroke [facility])    Past Surgical History:  Procedure Laterality Date   CARDIAC CATHETERIZATION     x2   CARDIAC CATHETERIZATION N/A 01/11/2016   Procedure: Left Heart Cath and Coronary Angiography;  Surgeon: Iran Ouch, MD;  Location: [facility] INVASIVE CV LAB;  Service: Cardiovascular;  Laterality: N/A;   CAROTID ENDARTERECTOMY   11/28/2006   left   CATARACT EXTRACTION W/ INTRAOCULAR LENS IMPLANT Bilateral    CORONARY ATHERECTOMY N/A 02/27/2017   Procedure: Coronary Atherectomy;  Surgeon: Lennette Bihari, MD;  Location: [facility] INVASIVE CV LAB;  Service: Cardiovascular;  Laterality: N/A;   HERNIA REPAIR     LEFT HEART CATH AND CORONARY ANGIOGRAPHY N/A 02/21/2017   Procedure: Left Heart Cath and Coronary Angiography;  Surgeon: Runell Gess, MD;  Location: [facility] INVASIVE CV LAB;  Service: Cardiovascular;  Laterality: N/A;   TONSILLECTOMY     TOTAL HIP ARTHROPLASTY Left 11/13/2022   Procedure: LEFT TOTAL HIP ARTHROPLASTY ANTERIOR APPROACH;  Surgeon: Kathryne Hitch, MD;  Location: [facility] OR;  Service: Orthopedics;  Laterality: Left;   Patient Active Problem List   Diagnosis Date Noted   Status post total replacement of left hip 11/13/2022   S/P total hip arthroplasty 11/13/2022   History of stroke 04/10/2022   Anemia due to chronic kidney disease 02/06/2022   Macular degeneration 02/06/2022   Multifocal pneumonia 12/17/2020   Pneumonia due to COVID-19 virus 12/17/2020   New onset atrial fibrillation (HCC) 12/17/2020   CKD (chronic kidney disease), stage III (HCC) 12/17/2020   Anemia in chronic kidney disease (CKD) 12/17/2020   Poor  balance 10/06/2019   Laryngitis 04/17/2017   Cough 03/29/2017   Acute bronchitis 03/29/2017   Acute upper respiratory infection 03/29/2017   Presence of drug coated stent in RCA & Ramus Intermedius. 02/28/2017   Gout flare 02/22/2017   Coronary artery disease involving native coronary artery of native heart with angina pectoris (HCC)    Carotid artery disease (HCC)    HLD (hyperlipidemia)    Unstable angina (HCC) 02/20/2017   Atherosclerosis of native coronary artery of native heart    Essential hypertension 01/06/2009   Second degree atrioventricular block, Mobitz type I 01/06/2009   AV block, 1st degree 01/06/2009    REFERRING DIAG: Status post total replacement of left  hip  THERAPY DIAG:  Pain in left hip  Muscle weakness (generalized)  Other abnormalities of gait and mobility  Rationale for Evaluation and Treatment Rehabilitation  PERTINENT HISTORY: Left THA anterior approach 11/13/2022  PRECAUTIONS: Fall   SUBJECTIVE:                                                                                                                                                                                     SUBJECTIVE STATEMENT:  Patient reports he is doing well. He is walking better with his walker and feels he is getting stronger.   PAIN:  Are you having pain? No   OBJECTIVE: (objective measures completed at initial evaluation unless otherwise dated) POSTURE:             Rounded shoulders   LOWER EXTREMITY MMT:   MMT Right eval Left eval Left 12/31/2022 Left 01/11/2023 Left 01/17/2023 Left 01/28/2023  Hip flexion 4 3- 4- 4- 4- 4-  Hip extension 3 2  3- 3- 3-  Hip abduction 4- 2 3- 3- 3- 3-  Hip adduction          Hip internal rotation          Hip external rotation          Knee flexion 5 4      Knee extension 5 4+      Ankle dorsiflexion          Ankle plantarflexion          Ankle inversion          Ankle eversion           (Blank rows = not tested)   FUNCTIONAL TESTS:  Sit to stand: unable to stand without using UE for support  01/11/2023: requires UE assist to stand from standard chair 01/28/2023: able to perform sit to stand without UE support   : 205 ft using SPC 01/24/23: 254 Ft using SPC  GAIT: Distance walked: 205 ft Assistive device utilized: Single point cane on right Level of assistance: SBA Comments: Decreased gait speed, decreased step length, antalgic on left     TODAY'S TREATMENT:    [facility] Adult PT Treatment:                                                DATE: 02/04/23 Therapeutic Exercise: Nustep L7 x 5 minutes with UE/LE while taking subjective Standing heel raises 2 x 15 Standing hip abduction with 2# 2 x  15 each Standing hip extension with 2# 2 x 15 each Standing alternating march with 2# 2 x 20 Forward 6" step-up 2 x 10 each LAQ with 5# 2 x 15 each Sit to stand using UE to stand and without to lower 2 x 10 Side stepping at counter without UE support 2 x 4 lengths down/back Romberg on [employer] 2 x 60 sec   [facility] Adult PT Treatment:                                                DATE: 01/31/23 Therapeutic Exercise: Nustep L7 x 5 minutes UE/LE  STS x 10  Heel raises x 20 2# hip abduction 10 x 2 each 2# marching  x 10 each  6 inch left step up x 10  bil UE, x 10 single UE 6 inch right step up x 10, 1 UE Seated LAQ 2# x 20, left Tandem stance trials - intermittently able to release UE Therapeutic Activity: Side stepping in parallel bars light touch x 10 feet  Gait with RW- cues for step length and posture  [facility] Adult PT Treatment:                                                DATE: 01/28/23 Therapeutic Exercise: NuStep L7 x 5 minutes UE/LE while taking subjective Sit to stand from standard chair without UE support 2 x 5 Standing heel toe raise x 10  Standing march x 20 with single UE support  Standing hip abduction x 10 each Therapeutic Activity: Stair negotiation with SPC on right and handrail on left, reciprocal when ascending stairs and step-to when descending stairs 3 x 5 up/down Gait training: With SPC x 150 ft working on upright posture and sequencing pattern with SPC, step-through gait pattern   PATIENT EDUCATION:  Education details: HEP Person educated: Patient Education method: Explanation Education comprehension: verbalized understanding   HOME EXERCISE PROGRAM: Access Code: [email or web address]     ASSESSMENT: CLINICAL IMPRESSION: Patient tolerated therapy well with no adverse effects. Therapy focused on progressing LE strength and balance/standing stability exercises with good tolerance. He did have more difficulty performing sit to stand this visit so used UE to assist with  standing. He seems to be progressing well and is walking better with his walker now that he has wheels. He does require cueing for posture with walking. No changes made to HEP this visit. Patient would benefit from continued skilled PT to progress strength, walking, balance, and general mobility to maximize his independence and functional ability.  OBJECTIVE IMPAIRMENTS: Abnormal gait, decreased activity tolerance, decreased balance, decreased endurance, decreased ROM, decreased strength, and pain.    ACTIVITY LIMITATIONS: lifting, standing, squatting, stairs, transfers, dressing, and locomotion level   PARTICIPATION LIMITATIONS: meal prep, cleaning, laundry, and community activity   PERSONAL FACTORS: Age, Fitness, Past/current experiences, and Time since onset of injury/illness/exacerbation are also affecting patient's functional outcome.      GOALS: Goals reviewed with patient? Yes   SHORT TERM GOALS: Target date: 01/09/2023   Patient will be I with initial HEP in order to progress with therapy. Baseline: HEP provided at eval 01/11/2023: independent Goal status: MET   2.  Patient will be able to perform sit to stand without UE support to indicate improved LE strength and transfer ability Baseline: patient requires UE to stand 01/11/2023: continues to require UE to stand 01/21/23: able to complete STS without UE support Goal status: MET   3.  Patient will report </= 2/10 left hip pain with wallking and activity to reduce functional limitations Baseline: 4/10 01/11/2023: 2/10 Goal status: MET   LONG TERM GOALS: Target date: 03/11/2023   Patient will be I with final HEP to maintain progress from PT. Baseline: HEP provided at eval 01/28/2023: progressing Goal status: ONGOING   2.  Patient will improve >/= 500 ft using SPC to improve community access Baseline: 205 ft using [facility] 01/24/2023: 254 ft using SPC Goal status: ONGOING   3.  Patient will be able to negotiate 14 steps  with railing using reciprocal pattern to improve independence and household mobility Baseline: step-to pattern for 1-2 steps 01/28/2023: uses step-to pattern when descending stairs Goal status: ONGOING   4.  Patient will demonstrate left hip strength >/= 4-/5 MMT and knee strength 5/5 MMT to improve walking tolerance Baseline: see limitations noted above 01/28/2023: strength limitations noted above Goal status: ONGOING     PLAN: PT FREQUENCY: 1-2x/week   PT DURATION: 6 weeks   PLANNED INTERVENTIONS: Therapeutic exercises, Therapeutic activity, Neuromuscular re-education, Balance training, Gait training, Patient/Family education, Self Care, Joint mobilization, Stair training, Aquatic Therapy, Cryotherapy, Moist heat, Manual therapy, and Re-evaluation   PLAN FOR NEXT SESSION: Review HEP and progress PRN, progress hip and LE strength, gait and balance training     Rosana Hoes, PT, DPT, LAT, ATC 02/04/23  10:31 AM Phone: [phone] Fax: [phone]

## 2023-02-07 ENCOUNTER — Ambulatory Visit: Payer: Medicare Other | Admitting: Physical Therapy

## 2023-02-13 NOTE — Therapy (Addendum)
 OUTPATIENT PHYSICAL THERAPY TREATMENT NOTE  DISCHARGE      Patient Name: [name] MRN: [id number] DOB:May 12, 1928, 89 y.o., male Today's Date: 02/14/2023  PCP: Stevphen Rochester, MD REFERRING PROVIDER: Kathryne Hitch, MD   END OF SESSION:   PT End of Session - 02/14/23 1114     Visit Number 13    Number of Visits 18    Date for PT Re-Evaluation 03/11/23    Authorization Type MCR    Progress Note Due on Visit 20    PT Start Time 1105    PT Stop Time 1145    PT Time Calculation (min) 40 min    Activity Tolerance Patient tolerated treatment well    Behavior During Therapy WFL for tasks assessed/performed                    Past Medical History:  Diagnosis Date   Arthritis    AV BLOCK, 1ST DEGREE    Carotid artery disease (HCC)    a. s/p Left ECA followed by [name]   Coronary artery disease    Coronary artery disease involving coronary bypass graft of native heart with angina pectoris (HCC)    a. LHC 12/2015  3vd Left Cx 100% with colaterals, and distally LAD occluded, DES to prox-mid RCA  b. 02/2017: repeat cath with patent RCA stent and stable CAD; 02/27/2017: Rota-PCI to Ost [place]   GERD (gastroesophageal reflux disease)    HLD (hyperlipidemia)    HTN (hypertension)    Hypothyroidism    Myocardial infarction (HCC)    20 years   NSVT (nonsustained ventricular tachycardia) (HCC)    during stress test 12/2015   Presence of drug coated stent in RCA & Ramus Intermedius. 02/28/2017   12/2015: PCI p-mRCA - Stent Resolute Integ 3.5x34 02/2017: Rota-PCI o-mRamus - overlapping DES STENT SYNERGY DES 3X32  & 3x24 (tapered post-dilation)   Stroke [facility])    Past Surgical History:  Procedure Laterality Date   CARDIAC CATHETERIZATION     x2   CARDIAC CATHETERIZATION N/A 01/11/2016   Procedure: Left Heart Cath and Coronary Angiography;  Surgeon: Iran Ouch, MD;  Location: [facility] INVASIVE CV LAB;  Service: Cardiovascular;  Laterality: N/A;   CAROTID  ENDARTERECTOMY  11/28/2006   left   CATARACT EXTRACTION W/ INTRAOCULAR LENS IMPLANT Bilateral    CORONARY ATHERECTOMY N/A 02/27/2017   Procedure: Coronary Atherectomy;  Surgeon: Lennette Bihari, MD;  Location: [facility] INVASIVE CV LAB;  Service: Cardiovascular;  Laterality: N/A;   HERNIA REPAIR     LEFT HEART CATH AND CORONARY ANGIOGRAPHY N/A 02/21/2017   Procedure: Left Heart Cath and Coronary Angiography;  Surgeon: Runell Gess, MD;  Location: [facility] INVASIVE CV LAB;  Service: Cardiovascular;  Laterality: N/A;   TONSILLECTOMY     TOTAL HIP ARTHROPLASTY Left 11/13/2022   Procedure: LEFT TOTAL HIP ARTHROPLASTY ANTERIOR APPROACH;  Surgeon: Kathryne Hitch, MD;  Location: [facility] OR;  Service: Orthopedics;  Laterality: Left;   Patient Active Problem List   Diagnosis Date Noted   Status post total replacement of left hip 11/13/2022   S/P total hip arthroplasty 11/13/2022   History of stroke 04/10/2022   Anemia due to chronic kidney disease 02/06/2022   Macular degeneration 02/06/2022   Multifocal pneumonia 12/17/2020   Pneumonia due to COVID-19 virus 12/17/2020   New onset atrial fibrillation (HCC) 12/17/2020   CKD (chronic kidney disease), stage III (HCC) 12/17/2020   Anemia in chronic kidney disease (CKD) 12/17/2020  Poor balance 10/06/2019   Laryngitis 04/17/2017   Cough 03/29/2017   Acute bronchitis 03/29/2017   Acute upper respiratory infection 03/29/2017   Presence of drug coated stent in RCA & Ramus Intermedius. 02/28/2017   Gout flare 02/22/2017   Coronary artery disease involving native coronary artery of native heart with angina pectoris (HCC)    Carotid artery disease (HCC)    HLD (hyperlipidemia)    Unstable angina (HCC) 02/20/2017   Atherosclerosis of native coronary artery of native heart    Essential hypertension 01/06/2009   Second degree atrioventricular block, Mobitz type I 01/06/2009   AV block, 1st degree 01/06/2009    REFERRING DIAG: Status post total  replacement of left hip  THERAPY DIAG:  Pain in left hip  Muscle weakness (generalized)  Other abnormalities of gait and mobility  Rationale for Evaluation and Treatment Rehabilitation  PERTINENT HISTORY: Left THA anterior approach 11/13/2022  PRECAUTIONS: Fall   SUBJECTIVE:                                                                                                                                                                                     SUBJECTIVE STATEMENT:  Patient arrives reporting generalized stiffness.   PAIN:  Are you having pain? No   OBJECTIVE: (objective measures completed at initial evaluation unless otherwise dated) POSTURE:             Rounded shoulders   LOWER EXTREMITY MMT:   MMT Right eval Left eval Left 12/31/2022 Left 01/11/2023 Left 01/17/2023 Left 01/28/2023 Left 02/14/2023  Hip flexion 4 3- 4- 4- 4- 4-   Hip extension 3 2  3- 3- 3-   Hip abduction 4- 2 3- 3- 3- 3- 3-  Hip adduction           Hip internal rotation           Hip external rotation           Knee flexion 5 4       Knee extension 5 4+       Ankle dorsiflexion           Ankle plantarflexion           Ankle inversion           Ankle eversion            (Blank rows = not tested)   FUNCTIONAL TESTS:  Sit to stand: unable to stand without using UE for support  01/11/2023: requires UE assist to stand from standard chair 01/28/2023: able to perform sit to stand without UE support   : 205 ft using SPC 01/24/23: 254 Ft using SPC  GAIT: Distance walked: 205 ft Assistive device utilized: Single point cane on right Level of assistance: SBA Comments: Decreased gait speed, decreased step length, antalgic on left     TODAY'S TREATMENT:    [facility] Adult PT Treatment:                                                DATE: 02/14/23 Therapeutic Exercise: Nustep L7 x 5 minutes with UE/LE while taking subjective LTR x 10 SKTC 2 x 15 sec each Modified thomas stretch 2 x 30 sec on  left SLR 2 x 10 each Bridge 2 x 12 Sidelying hip abduction 2 x 10 each Sit to stand using single UE to stand and without to lower 2 x 10 Standing heel raises 2 x 20 Standing hip abduction with 2 x 15 each Standing hip extension with 2 x 15 each Standing alternating march with 2 x 20 Forward 6" step-up x 10 each Side stepping at counter without UE support x 4 lengths down/back Row with green 2 x 15   [facility] Adult PT Treatment:                                                DATE: 02/04/23 Therapeutic Exercise: Nustep L7 x 5 minutes with UE/LE while taking subjective Standing heel raises 2 x 15 Standing hip abduction with 2# 2 x 15 each Standing hip extension with 2# 2 x 15 each Standing alternating march with 2# 2 x 20 Forward 6" step-up 2 x 10 each LAQ with 5# 2 x 15 each Sit to stand using UE to stand and without to lower 2 x 10 Side stepping at counter without UE support 2 x 4 lengths down/back Romberg on Airex 2 x 60 sec  [facility] Adult PT Treatment:                                                DATE: 01/31/23 Therapeutic Exercise: Nustep L7 x 5 minutes UE/LE  STS x 10  Heel raises x 20 2# hip abduction 10 x 2 each 2# marching  x 10 each  6 inch left step up x 10  bil UE, x 10 single UE 6 inch right step up x 10, 1 UE Seated LAQ 2# x 20, left Tandem stance trials - intermittently able to release UE Therapeutic Activity: Side stepping in parallel bars light touch x 10 feet  Gait with RW- cues for step length and posture   PATIENT EDUCATION:  Education details: HEP Person educated: Patient Education method: Explanation Education comprehension: verbalized understanding   HOME EXERCISE PROGRAM: Access Code: [email or web address]     ASSESSMENT: CLINICAL IMPRESSION: Patient tolerated therapy well with no adverse effects. Therapy focused on some initial stretching to help alleviate stiffness and then progressing his hip and LE strengthening. He continues to demonstrate gross limitations  of hip strength but seems to be more mobile with his RW. He does require continuous cueing for proper exercise technique and coordination. Incorporated some postural strengthening to improve posture with walking and he was cued to stay close to  RW to improve his posture. No changes to HEP this visit. Patient would benefit from continued skilled PT to progress strength, walking, balance, and general mobility to maximize his independence and functional ability.    OBJECTIVE IMPAIRMENTS: Abnormal gait, decreased activity tolerance, decreased balance, decreased endurance, decreased ROM, decreased strength, and pain.    ACTIVITY LIMITATIONS: lifting, standing, squatting, stairs, transfers, dressing, and locomotion level   PARTICIPATION LIMITATIONS: meal prep, cleaning, laundry, and community activity   PERSONAL FACTORS: Age, Fitness, Past/current experiences, and Time since onset of injury/illness/exacerbation are also affecting patient's functional outcome.      GOALS: Goals reviewed with patient? Yes   SHORT TERM GOALS: Target date: 01/09/2023   Patient will be I with initial HEP in order to progress with therapy. Baseline: HEP provided at eval 01/11/2023: independent Goal status: MET   2.  Patient will be able to perform sit to stand without UE support to indicate improved LE strength and transfer ability Baseline: patient requires UE to stand 01/11/2023: continues to require UE to stand 01/21/23: able to complete STS without UE support Goal status: MET   3.  Patient will report </= 2/10 left hip pain with wallking and activity to reduce functional limitations Baseline: 4/10 01/11/2023: 2/10 Goal status: MET   LONG TERM GOALS: Target date: 03/11/2023   Patient will be I with final HEP to maintain progress from PT. Baseline: HEP provided at eval 01/28/2023: progressing Goal status: ONGOING   2.  Patient will improve >/= 500 ft using SPC to improve community access Baseline: 205 ft  using [facility] 01/24/2023: 254 ft using SPC Goal status: ONGOING   3.  Patient will be able to negotiate 14 steps with railing using reciprocal pattern to improve independence and household mobility Baseline: step-to pattern for 1-2 steps 01/28/2023: uses step-to pattern when descending stairs Goal status: ONGOING   4.  Patient will demonstrate left hip strength >/= 4-/5 MMT and knee strength 5/5 MMT to improve walking tolerance Baseline: see limitations noted above 01/28/2023: strength limitations noted above Goal status: ONGOING     PLAN: PT FREQUENCY: 1-2x/week   PT DURATION: 6 weeks   PLANNED INTERVENTIONS: Therapeutic exercises, Therapeutic activity, Neuromuscular re-education, Balance training, Gait training, Patient/Family education, Self Care, Joint mobilization, Stair training, Aquatic Therapy, Cryotherapy, Moist heat, Manual therapy, and Re-evaluation   PLAN FOR NEXT SESSION: Review HEP and progress PRN, progress hip and LE strength, gait and balance training     Rosana Hoes, PT, DPT, LAT, ATC 02/14/23  11:45 AM Phone: [phone] Fax: [phone]   PHYSICAL THERAPY DISCHARGE SUMMARY  Visits from Start of Care: 13  Current functional level related to goals / functional outcomes: See above   Remaining deficits: See above   Education / Equipment: HEP   Patient agrees to discharge. Patient goals were not met. Patient is being discharged due to not returning since the last visit.  Rosana Hoes, PT, DPT, LAT, ATC 10/28/23  10:39 AM Phone: [phone] Fax: [phone]

## 2023-02-14 ENCOUNTER — Encounter: Payer: Self-pay | Admitting: Physical Therapy

## 2023-02-14 ENCOUNTER — Other Ambulatory Visit: Payer: Self-pay

## 2023-02-14 ENCOUNTER — Ambulatory Visit: Payer: Medicare Other | Admitting: Physical Therapy

## 2023-02-14 DIAGNOSIS — M6281 Muscle weakness (generalized): Secondary | ICD-10-CM

## 2023-02-14 DIAGNOSIS — M25552 Pain in left hip: Secondary | ICD-10-CM

## 2023-02-14 DIAGNOSIS — R2689 Other abnormalities of gait and mobility: Secondary | ICD-10-CM

## 2023-02-18 ENCOUNTER — Encounter (HOSPITAL_BASED_OUTPATIENT_CLINIC_OR_DEPARTMENT_OTHER): Payer: Self-pay

## 2023-02-18 ENCOUNTER — Other Ambulatory Visit (HOSPITAL_BASED_OUTPATIENT_CLINIC_OR_DEPARTMENT_OTHER): Payer: Self-pay

## 2023-02-18 ENCOUNTER — Other Ambulatory Visit: Payer: Self-pay

## 2023-02-18 ENCOUNTER — Emergency Department (HOSPITAL_BASED_OUTPATIENT_CLINIC_OR_DEPARTMENT_OTHER)
Admission: EM | Admit: 2023-02-18 | Discharge: 2023-02-18 | Disposition: A | Discharge status: Home / Self Care | Payer: Medicare Other | Attending: Emergency Medicine | Admitting: Emergency Medicine

## 2023-02-18 ENCOUNTER — Emergency Department (HOSPITAL_BASED_OUTPATIENT_CLINIC_OR_DEPARTMENT_OTHER): Payer: Medicare Other

## 2023-02-18 DIAGNOSIS — X58XXXA Exposure to other specified factors, initial encounter: Secondary | ICD-10-CM | POA: Diagnosis not present

## 2023-02-18 DIAGNOSIS — R1031 Right lower quadrant pain: Secondary | ICD-10-CM | POA: Insufficient documentation

## 2023-02-18 DIAGNOSIS — I2510 Atherosclerotic heart disease of native coronary artery without angina pectoris: Secondary | ICD-10-CM | POA: Diagnosis not present

## 2023-02-18 DIAGNOSIS — M549 Dorsalgia, unspecified: Secondary | ICD-10-CM | POA: Diagnosis present

## 2023-02-18 DIAGNOSIS — Z7982 Long term (current) use of aspirin: Secondary | ICD-10-CM | POA: Insufficient documentation

## 2023-02-18 DIAGNOSIS — S39012A Strain of muscle, fascia and tendon of lower back, initial encounter: Secondary | ICD-10-CM | POA: Diagnosis not present

## 2023-02-18 DIAGNOSIS — Z7902 Long term (current) use of antithrombotics/antiplatelets: Secondary | ICD-10-CM | POA: Diagnosis not present

## 2023-02-18 DIAGNOSIS — I729 Aneurysm of unspecified site: Secondary | ICD-10-CM | POA: Diagnosis not present

## 2023-02-18 LAB — URINALYSIS, ROUTINE W REFLEX MICROSCOPIC
Bilirubin Urine: NEGATIVE
Glucose, UA: NEGATIVE mg/dL
Hgb urine dipstick: NEGATIVE
Ketones, ur: NEGATIVE mg/dL
Leukocytes,Ua: NEGATIVE
Nitrite: NEGATIVE
Specific Gravity, Urine: >1.046 — ABNORMAL HIGH (ref 1.005–1.030)
pH: 5.5 (ref 5.0–8.0)

## 2023-02-18 LAB — COMPREHENSIVE METABOLIC PANEL
ALT: 13 U/L (ref 0–44)
AST: 18 U/L (ref 15–41)
Albumin: 4 g/dL (ref 3.5–5.0)
Alkaline Phosphatase: 84 U/L (ref 38–126)
Anion gap: 12 (ref 5–15)
BUN: 29 mg/dL — ABNORMAL HIGH (ref 8–23)
CO2: 22 mmol/L (ref 22–32)
Calcium: 9.1 mg/dL (ref 8.9–10.3)
Chloride: 108 mmol/L (ref 98–111)
Creatinine, Ser: 1.3 mg/dL — ABNORMAL HIGH (ref 0.61–1.24)
GFR, Estimated: 51 mL/min — ABNORMAL LOW (ref >60)
Glucose, Bld: 161 mg/dL — ABNORMAL HIGH (ref 70–99)
Potassium: 4 mmol/L (ref 3.5–5.1)
Sodium: 142 mmol/L (ref 135–145)
Total Bilirubin: 1.1 mg/dL (ref 0.3–1.2)
Total Protein: 6.8 g/dL (ref 6.5–8.1)

## 2023-02-18 LAB — LIPASE, BLOOD: Lipase: 27 U/L (ref 11–51)

## 2023-02-18 LAB — CBC
HCT: 34.9 % — ABNORMAL LOW (ref 39.0–52.0)
Hemoglobin: 11.4 g/dL — ABNORMAL LOW (ref 13.0–17.0)
MCH: 31.4 pg (ref 26.0–34.0)
MCHC: 32.7 g/dL (ref 30.0–36.0)
MCV: 96.1 fL (ref 80.0–100.0)
Platelets: 208 10*3/uL (ref 150–400)
RBC: 3.63 MIL/uL — ABNORMAL LOW (ref 4.22–5.81)
RDW: 15.3 % (ref 11.5–15.5)
WBC: 8.9 10*3/uL (ref 4.0–10.5)
nRBC: 0 % (ref 0.0–0.2)

## 2023-02-18 MED ORDER — HYDROCODONE-ACETAMINOPHEN 5-325 MG PO TABS
1.0000 | ORAL_TABLET | Freq: Once | ORAL | Status: AC
  Administered 2023-02-18: 1 via ORAL
  Filled 2023-02-18: qty 1

## 2023-02-18 MED ORDER — POLYETHYLENE GLYCOL 3350 17 G PO PACK: 17.0000 g | PACK | Freq: Every day | ORAL | 0 refills | Status: DC

## 2023-02-18 MED ORDER — LIDOCAINE 4 % EX PTCH: 1.0000 | MEDICATED_PATCH | CUTANEOUS | 0 refills | Status: DC

## 2023-02-18 MED ORDER — SODIUM CHLORIDE 0.9 % IV BOLUS
500.0000 mL | Freq: Once | INTRAVENOUS | Status: AC
  Administered 2023-02-18: 500 mL via INTRAVENOUS

## 2023-02-18 MED ORDER — LIDOCAINE 5 % EX PTCH
1.0000 | MEDICATED_PATCH | CUTANEOUS | Status: DC
  Administered 2023-02-18: 1 via TRANSDERMAL
  Filled 2023-02-18: qty 1

## 2023-02-18 MED ORDER — HYDROCODONE-ACETAMINOPHEN 5-325 MG PO TABS: 1.0000 | ORAL_TABLET | Freq: Four times a day (QID) | ORAL | 0 refills | Status: DC | PRN

## 2023-02-18 MED ORDER — IOHEXOL 350 MG/ML SOLN
100.0000 mL | Freq: Once | INTRAVENOUS | Status: AC | PRN
  Administered 2023-02-18: 75 mL via INTRAVENOUS

## 2023-02-18 NOTE — ED Provider Notes (Signed)
[place] EMERGENCY DEPARTMENT AT [facility] Provider Note   CSN: [id number] Arrival date & time: 02/18/23  1325     History  Chief Complaint  Patient presents with   Flank Pain    [name] is a 89 y.o. male, history of CAD, who presents to the ED secondary to severe abdominal pain radiating to his back for the last 8 hours.  He states around 7 AM he was in bed, and noticed that he had sudden severe right-sided back pain radiating to his right abdomen.  States worse when moving.  Denies any nausea, vomiting, urinary symptoms.  States that he is never had pain like this before.  Denies lifting anything heavy.  Notes that he has been taking his medications and is compliant with it.  No history of any kind of gallbladder or appendix removal.    Home Medications Prior to Admission medications   Medication Sig Start Date End Date Taking? Authorizing Provider  HYDROcodone-acetaminophen (NORCO/VICODIN) 5-325 MG tablet Take 1 tablet by mouth every 6 (six) hours as needed. 02/18/23  Yes Lejla Moeser L, PA  lidocaine (HM LIDOCAINE PATCH) 4 % Place 1 patch onto the skin daily. 02/18/23  Yes Yunique Dearcos L, PA  polyethylene glycol (MIRALAX) 17 g packet Take 17 g by mouth daily. 02/18/23  Yes Cinsere Mizrahi L, PA  acetaminophen (TYLENOL) 500 MG tablet Take 1 tablet (500 mg total) by mouth every 6 (six) hours as needed for moderate pain (wrist). Patient taking differently: Take 500 mg by mouth daily. 02/28/17   Arty Baumgartner, NP  allopurinol (ZYLOPRIM) 100 MG tablet Take 100 mg by mouth daily. 02/06/22   [provider]  aspirin EC 81 MG EC tablet Take 1 tablet (81 mg total) by mouth daily. 01/12/16   Arty Baumgartner, NP  b complex vitamins tablet Take 1 tablet by mouth daily.    [provider]  Calcium Carbonate (CALCIUM 500 PO) Take 500 mg by mouth daily.    [provider]  Calcium Carbonate Antacid (TUMS PO) Take 1 tablet by mouth as needed (heartburn).     [provider]  Cholecalciferol (VITAMIN D-3) 1000 units CAPS Take 1,000 Units by mouth daily.    [provider]  clopidogrel (PLAVIX) 75 MG tablet TAKE 1 TABLET DAILY Patient taking differently: Take 75 mg by mouth daily. 04/10/22   Wendall Stade, MD  ferrous sulfate 325 (65 FE) MG tablet Take 325 mg by mouth 2 (two) times daily with a meal.    [provider]  folic acid (FOLVITE) 1 MG tablet Take 1 tablet (1 mg total) by mouth daily. 12/19/20   Standley Brooking, MD  Glucosamine 500 MG CAPS Take 500 mg by mouth daily.    [provider]  isosorbide mononitrate (IMDUR) 60 MG 24 hr tablet TAKE ONE AND ONE-HALF TABLETS DAILY 11/06/22   Wendall Stade, MD  levothyroxine (SYNTHROID) 25 MCG tablet Take 25 mcg by mouth daily. 03/23/22   [provider]  lisinopril (ZESTRIL) 20 MG tablet Take 1 tablet (20 mg total) by mouth daily. 06/13/22 06/14/23  Swinyer, Zachary George, NP  Multiple Vitamin (MULTIVITAMIN) capsule Take 1 capsule by mouth daily.    [provider]  nitroGLYCERIN (NITROSTAT) 0.4 MG SL tablet PLACE 1 TABLET UNDER THE TONGUE EVERY 5 MINUTES AS NEEDED FOR CHEST PAIN. NOT TO EXCEED 3 DOSES Patient taking differently: Place 0.4 mg under the tongue every 5 (five) minutes as needed  for chest pain. 06/02/20   Wendall Stade, MD  Omega-3 Fatty Acids (FISH OIL) 1000 MG CAPS Take 1,000 mg by mouth daily.    [provider]  ranolazine (RANEXA) 1000 MG SR tablet TAKE 1 TABLET TWICE A DAY Patient taking differently: Take 1,000 mg by mouth once. 04/10/22   Wendall Stade, MD  simvastatin (ZOCOR) 20 MG tablet TAKE 1 TABLET DAILY 11/21/22   Wendall Stade, MD  vitamin C (ASCORBIC ACID) 500 MG tablet Take 500 mg by mouth daily.    [provider]  zinc sulfate 220 (50 Zn) MG capsule Take 1 capsule (220 mg total) by mouth daily. 12/19/20   Standley Brooking, MD      Allergies    Beta adrenergic blockers    Review of Systems    Review of Systems  Gastrointestinal:  Negative for nausea and vomiting.  Genitourinary:  Positive for flank pain.    Physical Exam Updated Vital Signs BP (!) 178/83   Pulse (!) 54   Temp (!) 97.4 F (36.3 C)   Resp 14   Ht 5\' 8"  (1.727 m)   Wt 73 kg   SpO2 98%   BMI 24.48 kg/m  Physical Exam Vitals and nursing note reviewed.  Constitutional:      General: He is not in acute distress.    Appearance: He is well-developed.  HENT:     Head: Normocephalic and atraumatic.  Eyes:     Conjunctiva/sclera: Conjunctivae normal.  Cardiovascular:     Rate and Rhythm: Normal rate and regular rhythm.     Heart sounds: No murmur heard. Pulmonary:     Effort: Pulmonary effort is normal. No respiratory distress.     Breath sounds: Normal breath sounds.  Abdominal:     Palpations: Abdomen is soft.     Tenderness: There is abdominal tenderness in the right lower quadrant.  Musculoskeletal:        General: No swelling.     Cervical back: Neck supple.     Comments: Tenderness to palpation along right lower quadrant radiating to the right flank, and right lower back.  Range of motion intact.  No wounds or rashes.  Skin:    General: Skin is warm and dry.     Capillary Refill: Capillary refill takes less than 2 seconds.  Neurological:     Mental Status: He is alert.  Psychiatric:        Mood and Affect: Mood normal.     ED Results / Procedures / Treatments   Labs (all labs ordered are listed, but only abnormal results are displayed) Labs Reviewed  COMPREHENSIVE METABOLIC PANEL - Abnormal; Notable for the following components:      Result Value   Glucose, Bld 161 (*)    BUN 29 (*)    Creatinine, Ser 1.30 (*)    GFR, Estimated 51 (*)    All other components within normal limits  CBC - Abnormal; Notable for the following components:   RBC 3.63 (*)    Hemoglobin 11.4 (*)    HCT 34.9 (*)    All other components within normal limits  URINALYSIS, ROUTINE W REFLEX MICROSCOPIC -  Abnormal; Notable for the following components:   Specific Gravity, Urine >1.046 (*)    Protein, ur TRACE (*)    All other components within normal limits  LIPASE, BLOOD    EKG EKG Interpretation Date/Time:  Monday February 18 2023 15:21:15 EDT Ventricular Rate:  45 PR Interval:  QRS Duration:  105 QT Interval:  506 QTC Calculation: 438 R Axis:   13  Text Interpretation: Atrial fibrillation Probable anterior infarct, age indeterminate No significant change since last tracing Confirmed by Alvira Monday ([id number]) on 02/18/2023 3:59:27 PM  Radiology CT Angio Chest/Abd/Pel for Dissection W and/or Wo Contrast  Result Date: 02/18/2023 CLINICAL DATA:  Right-sided pain radiating to the back EXAM: CT ANGIOGRAPHY CHEST, ABDOMEN AND PELVIS TECHNIQUE: Non-contrast CT of the chest was initially obtained. Multidetector CT imaging through the chest, abdomen and pelvis was performed using the standard protocol during bolus administration of intravenous contrast. Multiplanar reconstructed images and MIPs were obtained and reviewed to evaluate the vascular anatomy. RADIATION DOSE REDUCTION: This exam was performed according to the departmental dose-optimization program which includes automated exposure control, adjustment of the mA and/or kV according to patient size and/or use of iterative reconstruction technique. CONTRAST:  75mL OMNIPAQUE IOHEXOL 350 MG/ML SOLN COMPARISON:  CT 11/23/2019 FINDINGS: CTA CHEST FINDINGS Cardiovascular: Non contrasted images of the chest demonstrate no acute intramural hematoma. Extensive aortic vascular disease. No acute dissection is seen. Ascending aortic diameter measuring up to 4.5 cm. Prominent pulmonary trunk up to 3.6 cm with slightly enlarged appearing main pulmonary arteries. Three vessel coronary vascular disease. Cardiomegaly with biatrial enlargement. No pericardial effusion Mediastinum/Nodes: Midline trachea. No thyroid mass. No suspicious lymph nodes. Esophagus within  normal limits Lungs/Pleura: No acute airspace disease, pleural effusion or pneumothorax. Minimal scarring or atelectasis at the bases Musculoskeletal: Sternum is intact.  No acute osseous abnormality Review of the MIP images confirms the above findings. CTA ABDOMEN AND PELVIS FINDINGS VASCULAR Aorta: Advanced aortic vascular disease. No dissection. Aneurysmal dilatation of infrarenal abdominal aorta up to 3.3 cm. Chronically displaced wall calcifications of the distal infrarenal abdominal aorta, series 5, image 191 without discrete dissection flap seen. Celiac: Heavily calcified at the origin. Suspect at least mild origin stenosis. Distal vascular patency SMA: Heavily calcified at the origin without significant stenosis. Renals: Heavily calcified renal arteries. Suspect at least mild stenosis of the proximal renal arteries bilaterally. IMA: Patent without evidence of aneurysm, dissection, vasculitis or significant stenosis. Inflow: Advanced aortic vascular disease. 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 No high-grade stenosis. No occlusive disease. Review of the MIP images confirms the above findings. NON-VASCULAR Hepatobiliary: No focal liver abnormality is seen. No gallstones, gallbladder wall thickening, or biliary dilatation. Pancreas: Unremarkable. No pancreatic ductal dilatation or surrounding inflammatory changes. Spleen: Splenic granuloma Adrenals/Urinary Tract: Adrenal glands are within normal limits. Kidneys show no hydronephrosis. Left renal cysts, no imaging follow-up is recommended. Nonobstructing left kidney stone measuring up to 6 mm. The bladder is unremarkable. Stomach/Bowel: The stomach is nonenlarged. There is no dilated Zamara Cozad bowel. No acute bowel wall thickening. Lymphatic: No suspicious lymph nodes Reproductive: Prostate is unremarkable. Other: Negative for pelvic effusion or free air. Musculoskeletal: Left hip replacement  with artifact. No acute osseous abnormality. Advanced multilevel degenerative changes of the spine. Review of the MIP images confirms the above findings. IMPRESSION: 1. Negative for acute aortic dissection. Advanced aortic vascular disease. Aneurysmal dilatation of the ascending aorta up to 4.5 cm. Ascending thoracic aortic aneurysm. Recommend semi-annual imaging followup by CTA or MRA and referral to cardiothoracic surgery if not already obtained. This recommendation follows 2010 ACCF/AHA/AATS/ACR/ASA/SCA/SCAI/SIR/STS/SVM Guidelines for the Diagnosis and Management of Patients With Thoracic Aortic Disease. Circulation. 2010; 121: N829-F621. Aortic aneurysm NOS (ICD10-I71.9) 2. Cardiomegaly. Enlarged appearing central pulmonary vessels suggesting arterial hypertension 3.  3.3 cm infrarenal abdominal aortic aneurysm. Recommend follow-up ultrasound every 3 years. This recommendation follows ACR consensus guidelines: White Paper of the ACR Incidental Findings Committee II on Vascular Findings. J Am Coll Radiol 2013; 10:789-794. 4. 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 Advanced vascular disease of the abdomen and pelvis without high-grade stenosis or acute occlusive disease. 5. No CT evidence for acute intrathoracic, intra-abdominal, or intrapelvic abnormality. 6. Nonobstructing left kidney stone. 7. Aortic atherosclerosis. Aortic Atherosclerosis (ICD10-I70.0). Electronically Signed   By: Jasmine Pang M.D.   On: 02/18/2023 16:54    Procedures Procedures    Medications Ordered in ED Medications  lidocaine (LIDODERM) 5 % 1 patch (1 patch Transdermal Patch Applied 02/18/23 1509)  HYDROcodone-acetaminophen (NORCO/VICODIN) 5-325 MG per tablet 1 tablet (1 tablet Oral Given 02/18/23 1510)  sodium chloride 0.9 % bolus 500 mL (0 mLs Intravenous Stopped 02/18/23 1605)  iohexol (OMNIPAQUE) 350 MG/ML injection 100 mL (75 mLs Intravenous Contrast Given 02/18/23 1558)    ED Course/  Medical Decision Making/ A&P                             Medical Decision Making Patient is a 89 year old male, here for right-sided abdominal, and back pain that flared for the last 8 hours.  He is tender on exam limited to his right abdomen, as well as to his back.  We obtained a CT chest/abdomen/pelvis to rule out any kind of dissection given his diffuse history of coronary artery disease, and his age, and hypertensive state.  Additionally we will obtain blood work for further evaluation.  This may be secondary to muscle strain, will give Norco for this.  He has not had any recent falls.  Amount and/or Complexity of Data Reviewed Labs: ordered.    Details: Labs unremarkable, urinalysis negative Radiology: ordered.    Details: CTA shows multiple incidental findings.  Including multiple aneurysms.  No dissection noted.  Stone in the left kidney.  No right stones Discussion of management or test interpretation with external provider(s): Discussed with patient he is feeling much better after the Norco, his CTA was unremarkable except for the incidental findings.  He was informed that he will need to follow with primary care regarding this.  We discussed risk of aortic dissection and importance of blood pressure management.  I prescribed a few days of Norco for his back pain, given his age, and kidney function.  I have also prescribed him some MiraLAX with this to help prevent any kind of hard stools or bowel obstruction.  Lidocaine patch as prescribed.  Discussed return precautions patient voiced understanding.  Able to ambulate.  Risk OTC drugs. Prescription drug management.    Final Clinical Impression(s) / ED Diagnoses Final diagnoses:  Back strain, initial encounter  Aneurysm [facility])    Rx / DC Orders ED Discharge Orders          Ordered    HYDROcodone-acetaminophen (NORCO/VICODIN) 5-325 MG tablet  Every 6 hours PRN        02/18/23 1814    lidocaine (HM LIDOCAINE PATCH) 4 %  Every 24  hours        02/18/23 1814    polyethylene glycol (MIRALAX) 17 g packet  Daily        02/18/23 1814              Zakyra Kukuk, Harley Alto, PA 02/18/23 1831    Schlossman,  Denny Peon, MD 02/22/23 [phone]

## 2023-02-18 NOTE — ED Triage Notes (Signed)
Patient here POV from Home.  Endorses Right Sided Flank Pain that radiates somewhat to Right ABD and mostly to Right Lower Back since 0700 today. No Dysuria. No fevers. No N/V.   NAD Noted during triage. A&Ox4. GCS 15. BIB Wheelchair.

## 2023-02-18 NOTE — Discharge Instructions (Addendum)
You have 4 different aneurysms on your CTA, that you do not need to do anything right now, except for follow-up with your primary care doctor.  As the aneurysms have not burst, however given the width of one of them, there is increased risk of it bursting.  I recommend following up with primary care doctor, and keeping your blood pressure under control.  You will need yearly screening for these.  Additionally believe that your pain is likely from a back strain, there is no evidence of any stones on your right side, or intra-abdominal process.

## 2023-02-19 ENCOUNTER — Ambulatory Visit: Payer: Medicare Other | Admitting: Physical Therapy

## 2023-02-26 ENCOUNTER — Telehealth: Payer: Self-pay | Admitting: Physical Therapy

## 2023-02-26 ENCOUNTER — Ambulatory Visit: Payer: Medicare Other | Attending: Orthopaedic Surgery | Admitting: Physical Therapy

## 2023-02-26 NOTE — Telephone Encounter (Signed)
Attempted to contact patient twice due to missed PT appointment. Phone call was disconnected on each occasion so unable to leave VM.  Rosana Hoes, PT, DPT, LAT, ATC 02/26/23  2:06 PM Phone: [phone] Fax: [phone]

## 2023-03-11 ENCOUNTER — Emergency Department (HOSPITAL_COMMUNITY): Payer: Medicare Other

## 2023-03-11 ENCOUNTER — Other Ambulatory Visit: Payer: Self-pay

## 2023-03-11 ENCOUNTER — Emergency Department (HOSPITAL_COMMUNITY)
Admission: EM | Admit: 2023-03-11 | Discharge: 2023-03-11 | Disposition: A | Discharge status: Home / Self Care | Payer: Medicare Other | Attending: Emergency Medicine | Admitting: Emergency Medicine

## 2023-03-11 ENCOUNTER — Encounter (HOSPITAL_COMMUNITY): Payer: Self-pay

## 2023-03-11 DIAGNOSIS — W19XXXA Unspecified fall, initial encounter: Secondary | ICD-10-CM

## 2023-03-11 DIAGNOSIS — S51811A Laceration without foreign body of right forearm, initial encounter: Secondary | ICD-10-CM | POA: Insufficient documentation

## 2023-03-11 DIAGNOSIS — S51011A Laceration without foreign body of right elbow, initial encounter: Secondary | ICD-10-CM | POA: Diagnosis not present

## 2023-03-11 DIAGNOSIS — Z7982 Long term (current) use of aspirin: Secondary | ICD-10-CM | POA: Insufficient documentation

## 2023-03-11 DIAGNOSIS — S0003XA Contusion of scalp, initial encounter: Secondary | ICD-10-CM

## 2023-03-11 DIAGNOSIS — I700 Atherosclerosis of aorta: Secondary | ICD-10-CM | POA: Insufficient documentation

## 2023-03-11 DIAGNOSIS — Z23 Encounter for immunization: Secondary | ICD-10-CM | POA: Insufficient documentation

## 2023-03-11 DIAGNOSIS — I517 Cardiomegaly: Secondary | ICD-10-CM | POA: Insufficient documentation

## 2023-03-11 DIAGNOSIS — S0081XA Abrasion of other part of head, initial encounter: Secondary | ICD-10-CM | POA: Diagnosis not present

## 2023-03-11 DIAGNOSIS — S0990XA Unspecified injury of head, initial encounter: Secondary | ICD-10-CM | POA: Diagnosis present

## 2023-03-11 DIAGNOSIS — Z7902 Long term (current) use of antithrombotics/antiplatelets: Secondary | ICD-10-CM | POA: Diagnosis not present

## 2023-03-11 DIAGNOSIS — S51019A Laceration without foreign body of unspecified elbow, initial encounter: Secondary | ICD-10-CM

## 2023-03-11 DIAGNOSIS — W06XXXA Fall from bed, initial encounter: Secondary | ICD-10-CM | POA: Diagnosis not present

## 2023-03-11 LAB — I-STAT CHEM 8, ED
BUN: 38 mg/dL — ABNORMAL HIGH (ref 8–23)
BUN: 37 mg/dL — ABNORMAL HIGH (ref 8–23)
Calcium, Ion: 1.15 mmol/L (ref 1.15–1.40)
Calcium, Ion: 1.19 mmol/L (ref 1.15–1.40)
Chloride: 107 mmol/L (ref 98–111)
Chloride: 106 mmol/L (ref 98–111)
Creatinine, Ser: 1.8 mg/dL — ABNORMAL HIGH (ref 0.61–1.24)
Creatinine, Ser: 1.6 mg/dL — ABNORMAL HIGH (ref 0.61–1.24)
Glucose, Bld: 93 mg/dL (ref 70–99)
Glucose, Bld: 91 mg/dL (ref 70–99)
HCT: 30 % — ABNORMAL LOW (ref 39.0–52.0)
HCT: 27 % — ABNORMAL LOW (ref 39.0–52.0)
Hemoglobin: 10.2 g/dL — ABNORMAL LOW (ref 13.0–17.0)
Hemoglobin: 9.2 g/dL — ABNORMAL LOW (ref 13.0–17.0)
Potassium: 4.7 mmol/L (ref 3.5–5.1)
Potassium: 4.6 mmol/L (ref 3.5–5.1)
Sodium: 135 mmol/L (ref 135–145)
Sodium: 136 mmol/L (ref 135–145)
TCO2: 17 mmol/L — ABNORMAL LOW (ref 22–32)
TCO2: 19 mmol/L — ABNORMAL LOW (ref 22–32)

## 2023-03-11 LAB — CBC
HCT: 31.3 % — ABNORMAL LOW (ref 39.0–52.0)
Hemoglobin: 9.8 g/dL — ABNORMAL LOW (ref 13.0–17.0)
MCH: 31.1 pg (ref 26.0–34.0)
MCHC: 31.3 g/dL (ref 30.0–36.0)
MCV: 99.4 fL (ref 80.0–100.0)
Platelets: 166 10*3/uL (ref 150–400)
RBC: 3.15 MIL/uL — ABNORMAL LOW (ref 4.22–5.81)
RDW: 15.8 % — ABNORMAL HIGH (ref 11.5–15.5)
WBC: 6.1 10*3/uL (ref 4.0–10.5)
nRBC: 0 % (ref 0.0–0.2)

## 2023-03-11 LAB — COMPREHENSIVE METABOLIC PANEL
ALT: 17 U/L (ref 0–44)
AST: 23 U/L (ref 15–41)
Albumin: 3.4 g/dL — ABNORMAL LOW (ref 3.5–5.0)
Alkaline Phosphatase: 82 U/L (ref 38–126)
Anion gap: 12 (ref 5–15)
BUN: 43 mg/dL — ABNORMAL HIGH (ref 8–23)
CO2: 17 mmol/L — ABNORMAL LOW (ref 22–32)
Calcium: 9.1 mg/dL (ref 8.9–10.3)
Chloride: 105 mmol/L (ref 98–111)
Creatinine, Ser: 1.64 mg/dL — ABNORMAL HIGH (ref 0.61–1.24)
GFR, Estimated: 39 mL/min — ABNORMAL LOW (ref >60)
Glucose, Bld: 101 mg/dL — ABNORMAL HIGH (ref 70–99)
Potassium: 4.6 mmol/L (ref 3.5–5.1)
Sodium: 134 mmol/L — ABNORMAL LOW (ref 135–145)
Total Bilirubin: 1.1 mg/dL (ref 0.3–1.2)
Total Protein: 6.1 g/dL — ABNORMAL LOW (ref 6.5–8.1)

## 2023-03-11 LAB — I-STAT CG4 LACTIC ACID, ED: Lactic Acid, Venous: 0.9 mmol/L (ref 0.5–1.9)

## 2023-03-11 LAB — SAMPLE TO BLOOD BANK

## 2023-03-11 LAB — ETHANOL: Alcohol, Ethyl (B): <10 mg/dL (ref <10)

## 2023-03-11 LAB — PROTIME-INR
INR: 1.2 (ref 0.8–1.2)
Prothrombin Time: 15.1 seconds (ref 11.4–15.2)

## 2023-03-11 MED ORDER — LACTATED RINGERS IV BOLUS
1000.0000 mL | Freq: Once | INTRAVENOUS | Status: AC
  Administered 2023-03-11: 1000 mL via INTRAVENOUS

## 2023-03-11 MED ORDER — TETANUS-DIPHTH-ACELL PERTUSSIS 5-2.5-18.5 LF-MCG/0.5 IM SUSY
0.5000 mL | PREFILLED_SYRINGE | Freq: Once | INTRAMUSCULAR | Status: AC
  Administered 2023-03-11: 0.5 mL via INTRAMUSCULAR
  Filled 2023-03-11: qty 0.5

## 2023-03-11 NOTE — ED Notes (Signed)
Trauma Response Nurse Documentation   [name] is a 89 y.o. male arriving to [facility] ED via EMS  On clopidogrel 75 mg daily. Trauma was activated as a Level 2 by ED charge RN based on the following trauma criteria Elderly patients > 65 with head trauma on anti-coagulation (excluding ASA).  Patient cleared for CT by Dr. Clayborne Dana EDP. Pt transported to CT with trauma response nurse present to monitor. RN remained with the patient throughout their absence from the department for clinical observation.   GCS 15.  History   Past Medical History:  Diagnosis Date   Arthritis    AV BLOCK, 1ST DEGREE    Carotid artery disease (HCC)    a. s/p Left ECA followed by [name]   Coronary artery disease    Coronary artery disease involving coronary bypass graft of native heart with angina pectoris (HCC)    a. LHC 12/2015  3vd Left Cx 100% with colaterals, and distally LAD occluded, DES to prox-mid RCA  b. 02/2017: repeat cath with patent RCA stent and stable CAD; 02/27/2017: Rota-PCI to Ost RI   GERD (gastroesophageal reflux disease)    HLD (hyperlipidemia)    HTN (hypertension)    Hypothyroidism    Myocardial infarction (HCC)    20 years   NSVT (nonsustained ventricular tachycardia) (HCC)    during stress test 12/2015   Presence of drug coated stent in RCA & Ramus Intermedius. 02/28/2017   12/2015: PCI p-mRCA - Stent Resolute Integ 3.5x34 02/2017: Rota-PCI o-mRamus - overlapping DES STENT SYNERGY DES 3X32  & 3x24 (tapered post-dilation)   Stroke [facility])      Past Surgical History:  Procedure Laterality Date   CARDIAC CATHETERIZATION     x2   CARDIAC CATHETERIZATION N/A 01/11/2016   Procedure: Left Heart Cath and Coronary Angiography;  Surgeon: Iran Ouch, MD;  Location: [facility] INVASIVE CV LAB;  Service: Cardiovascular;  Laterality: N/A;   CAROTID ENDARTERECTOMY  11/28/2006   left   CATARACT EXTRACTION W/ INTRAOCULAR LENS IMPLANT Bilateral    CORONARY ATHERECTOMY N/A 02/27/2017   Procedure:  Coronary Atherectomy;  Surgeon: Lennette Bihari, MD;  Location: [facility] INVASIVE CV LAB;  Service: Cardiovascular;  Laterality: N/A;   HERNIA REPAIR     LEFT HEART CATH AND CORONARY ANGIOGRAPHY N/A 02/21/2017   Procedure: Left Heart Cath and Coronary Angiography;  Surgeon: Runell Gess, MD;  Location: [facility] INVASIVE CV LAB;  Service: Cardiovascular;  Laterality: N/A;   TONSILLECTOMY     TOTAL HIP ARTHROPLASTY Left 11/13/2022   Procedure: LEFT TOTAL HIP ARTHROPLASTY ANTERIOR APPROACH;  Surgeon: Kathryne Hitch, MD;  Location: [facility] OR;  Service: Orthopedics;  Laterality: Left;       Initial Focused Assessment (If applicable, or please see trauma documentation): Alert/oriented male presents via EMS from home after a fall from his bed. Right arm skin tear x2, bleeding controlled.  Airway patent/unobstructed, BS clear No obvious active hemorrhage, bleeding controlled to right arm skin tears GCS 15 PERRLA 2mm  CT's Completed:   CT Head and CT C-Spine   Interventions:  IV start and trauma lab draw Portable chest and pelvis XRAY CT head and cervical spine Wound care TDAP  Plan for disposition:  Pending workup  Consults completed:  none  Event Summary: Fall from bed this morning, takes plavix. Right arm skin tear x2.  MTP Summary (If applicable): NA  Bedside handoff with ED RN Alycia Rossetti.    Forrest Jaroszewski O Seri Kimmer  Trauma Response RN  Please call TRN at [phone] for further assistance.

## 2023-03-11 NOTE — ED Notes (Signed)
Ambulated pt in hallway. 1 lap around nursing station. Dan Humphreys was used for assisted device.

## 2023-03-11 NOTE — ED Triage Notes (Signed)
Pt fell off the bed at home. -LOC, he is anticoagulated on Plavix. Multiple skin tears. No other complaints.

## 2023-03-11 NOTE — Discharge Instructions (Signed)
All the x-rays today were normal without any evidence of broken bones or internal bleeding in the head.  You are going to be very stiff and achy over the next few days and you can take Tylenol for that as needed.  You will need to change the bandages on your arm daily until they are healed.  If you start developing severe headaches, confusion, vomiting return to the emergency room immediately.  Is make sure you are staying hydrated as blood work today showed that you are a little bit dehydrated.

## 2023-03-11 NOTE — ED Provider Notes (Signed)
[place] EMERGENCY DEPARTMENT AT [facility] ([place]) Provider Note   CSN: [id number] Arrival date & time: 03/11/23  0534     History {Add pertinent medical, surgical, social history, OB history to HPI:1} Chief Complaint  Patient presents with   Fall        Trauma    [name] is a 89 y.o. male.  89 yo M on Plavix for stents who presents to the ED for a mechanical fall with head injury as a leveled trauma. He states he went to sit on the bed and fell down causing skin tears to right arm and hitting his head on the floor. No LOC. No pain to the head. No neuro changes. Pain in right arm at this time.    Fall  Trauma Mechanism of injury: Fall       Home Medications Prior to Admission medications   Medication Sig Start Date End Date Taking? Authorizing Provider  acetaminophen (TYLENOL) 500 MG tablet Take 1 tablet (500 mg total) by mouth every 6 (six) hours as needed for moderate pain (wrist). Patient taking differently: Take 500 mg by mouth daily. 02/28/17   Arty Baumgartner, NP  allopurinol (ZYLOPRIM) 100 MG tablet Take 100 mg by mouth daily. 02/06/22   [provider]  aspirin EC 81 MG EC tablet Take 1 tablet (81 mg total) by mouth daily. 01/12/16   Arty Baumgartner, NP  b complex vitamins tablet Take 1 tablet by mouth daily.    [provider]  Calcium Carbonate (CALCIUM 500 PO) Take 500 mg by mouth daily.    [provider]  Calcium Carbonate Antacid (TUMS PO) Take 1 tablet by mouth as needed (heartburn).    [provider]  Cholecalciferol (VITAMIN D-3) 1000 units CAPS Take 1,000 Units by mouth daily.    [provider]  clopidogrel (PLAVIX) 75 MG tablet TAKE 1 TABLET DAILY Patient taking differently: Take 75 mg by mouth daily. 04/10/22   Wendall Stade, MD  ferrous sulfate 325 (65 FE) MG tablet Take 325 mg by mouth 2 (two) times daily with a meal.    [provider]  folic acid (FOLVITE) 1 MG tablet Take 1  tablet (1 mg total) by mouth daily. 12/19/20   Standley Brooking, MD  Glucosamine 500 MG CAPS Take 500 mg by mouth daily.    [provider]  HYDROcodone-acetaminophen (NORCO/VICODIN) 5-325 MG tablet Take 1 tablet by mouth every 6 (six) hours as needed. 02/18/23   Small, Brooke L, PA  isosorbide mononitrate (IMDUR) 60 MG 24 hr tablet TAKE ONE AND ONE-HALF TABLETS DAILY 11/06/22   Wendall Stade, MD  levothyroxine (SYNTHROID) 25 MCG tablet Take 25 mcg by mouth daily. 03/23/22   [provider]  lidocaine (HM LIDOCAINE PATCH) 4 % Place 1 patch onto the skin daily. 02/18/23   Small, Brooke L, PA  lisinopril (ZESTRIL) 20 MG tablet Take 1 tablet (20 mg total) by mouth daily. 06/13/22 06/14/23  Swinyer, Zachary George, NP  Multiple Vitamin (MULTIVITAMIN) capsule Take 1 capsule by mouth daily.    [provider]  nitroGLYCERIN (NITROSTAT) 0.4 MG SL tablet PLACE 1 TABLET UNDER THE TONGUE EVERY 5 MINUTES AS NEEDED FOR CHEST PAIN. NOT TO EXCEED 3 DOSES Patient taking differently: Place 0.4 mg under the tongue every 5 (five) minutes as needed for chest pain. 06/02/20   Wendall Stade, MD  Omega-3 Fatty Acids (FISH OIL) 1000 MG CAPS Take 1,000 mg by mouth  daily.    [provider]  polyethylene glycol (MIRALAX) 17 g packet Take 17 g by mouth daily. 02/18/23   Small, Brooke L, PA  ranolazine (RANEXA) 1000 MG SR tablet TAKE 1 TABLET TWICE A DAY Patient taking differently: Take 1,000 mg by mouth once. 04/10/22   Wendall Stade, MD  simvastatin (ZOCOR) 20 MG tablet TAKE 1 TABLET DAILY 11/21/22   Wendall Stade, MD  vitamin C (ASCORBIC ACID) 500 MG tablet Take 500 mg by mouth daily.    [provider]  zinc sulfate 220 (50 Zn) MG capsule Take 1 capsule (220 mg total) by mouth daily. 12/19/20   Standley Brooking, MD      Allergies    Beta adrenergic blockers    Review of Systems   Review of Systems  Physical Exam Updated Vital Signs BP (!) 165/70   Pulse (!) 51   Temp 97.9  F (36.6 C)   Resp 20   Ht 5\' 8"  (1.727 m)   Wt 73 kg   SpO2 99%   BMI 24.47 kg/m  Physical Exam Vitals and nursing note reviewed.  Constitutional:      Appearance: He is well-developed.  HENT:     Head: Normocephalic.     Comments: Small abrasions to right temple area Cardiovascular:     Rate and Rhythm: Normal rate.  Pulmonary:     Effort: Pulmonary effort is normal. No respiratory distress.  Abdominal:     General: There is no distension.  Musculoskeletal:        General: Normal range of motion.     Cervical back: Normal range of motion.     Comments: No cervical spine tenderness, thoracic spine tenderness or Lumbar spine tenderness.  No tenderness or pain with palpation and full ROM of all joints in upper and lower extremities.  No ecchymosis or other signs of trauma on back or extremities.  No Pain with AP or lateral compression of ribs.  ***Paracervical ttp, paraspinal ttp   Skin:    General: Skin is warm and dry.     Comments: Multiple skin tears to right elbow, another to right distal volar forearm, hemostatic with dressing in place  Neurological:     General: No focal deficit present.     Mental Status: He is alert.     ED Results / Procedures / Treatments   Labs (all labs ordered are listed, but only abnormal results are displayed) Labs Reviewed  CBC - Abnormal; Notable for the following components:      Result Value   RBC 3.15 (*)    Hemoglobin 9.8 (*)    HCT 31.3 (*)    RDW 15.8 (*)    All other components within normal limits  I-STAT CHEM 8, ED - Abnormal; Notable for the following components:   BUN 38 (*)    Creatinine, Ser 1.80 (*)    TCO2 17 (*)    Hemoglobin 10.2 (*)    HCT 30.0 (*)    All other components within normal limits  COMPREHENSIVE METABOLIC PANEL  ETHANOL  URINALYSIS, ROUTINE W REFLEX MICROSCOPIC  PROTIME-INR  I-STAT CG4 LACTIC ACID, ED  SAMPLE TO BLOOD BANK    EKG None  Radiology DG Chest Port 1 View  Result Date:  03/11/2023 CLINICAL DATA:  89 year old male with history of trauma from a fall. EXAM: PORTABLE CHEST 1 VIEW COMPARISON:  Chest x-ray 12/17/2020. FINDINGS: Lung volumes are low. No acute consolidative airspace disease. No pleural  effusions. No pneumothorax. No evidence of pulmonary edema. Heart size is mildly enlarged. Upper mediastinal contours are within normal limits. Atherosclerotic calcifications are noted in the thoracic aorta. IMPRESSION: 1. Low lung volumes without radiographic evidence of acute cardiopulmonary disease. 2. Mild cardiomegaly. 3. Aortic atherosclerosis. Electronically Signed   By: Trudie Reed M.D.   On: 03/11/2023 06:17   DG Pelvis Portable  Result Date: 03/11/2023 CLINICAL DATA:  89 year old male with history of trauma. Pelvic pain after a fall. EXAM: PORTABLE PELVIS 1-2 VIEWS COMPARISON:  11/13/2022. FINDINGS: Two views of the bony pelvis demonstrate no acute displaced fracture of the bony pelvic ring. Postoperative changes of left hip arthroplasty are noted. No definite periprosthetic fracture or other acute abnormality. Right femoral head projects within the acetabulum. Joint space narrowing, subchondral sclerosis, subchondral cyst formation and osteophyte formation are noted in the right hip joint, indicative of advanced osteoarthritis. Numerous vascular calcifications are noted. IMPRESSION: 1. No acute radiographic abnormality of the bony pelvis. 2. Status post left hip arthroplasty. 3. Severe osteoarthritis of the right hip. 4. Atherosclerosis. Electronically Signed   By: Trudie Reed M.D.   On: 03/11/2023 06:16   CT HEAD WO CONTRAST  Result Date: 03/11/2023 CLINICAL DATA:  Level 2 trauma, fall on blood thinners. EXAM: CT HEAD WITHOUT CONTRAST CT CERVICAL SPINE WITHOUT CONTRAST TECHNIQUE: Multidetector CT imaging of the head and cervical spine was performed following the standard protocol without intravenous contrast. Multiplanar CT image reconstructions of the cervical  spine were also generated. RADIATION DOSE REDUCTION: This exam was performed according to the departmental dose-optimization program which includes automated exposure control, adjustment of the mA and/or kV according to patient size and/or use of iterative reconstruction technique. COMPARISON:  08/01/2022 head CT FINDINGS: CT HEAD FINDINGS Brain: No evidence of acute infarction, hemorrhage, hydrocephalus, extra-axial collection or mass lesion/mass effect. Generalized atrophy. Chronic small vessel ischemia in the cerebral white matter with small chronic left superior frontal infarct. Vascular: Atheromatous calcification. Skull: Normal. Negative for fracture or focal lesion. Sinuses/Orbits: No acute finding. CT CERVICAL SPINE FINDINGS Alignment: No traumatic malalignment Skull base and vertebrae: No acute fracture.  Generalized osteopenia Soft tissues and spinal canal: No prevertebral fluid or swelling. No visible canal hematoma. Disc levels:  Generalized degenerative endplate and facet spurring Upper chest: No visible injury IMPRESSION: No evidence of acute intracranial or cervical spine injury. Electronically Signed   By: Tiburcio Pea M.D.   On: 03/11/2023 06:06   CT CERVICAL SPINE WO CONTRAST  Result Date: 03/11/2023 CLINICAL DATA:  Level 2 trauma, fall on blood thinners. EXAM: CT HEAD WITHOUT CONTRAST CT CERVICAL SPINE WITHOUT CONTRAST TECHNIQUE: Multidetector CT imaging of the head and cervical spine was performed following the standard protocol without intravenous contrast. Multiplanar CT image reconstructions of the cervical spine were also generated. RADIATION DOSE REDUCTION: This exam was performed according to the departmental dose-optimization program which includes automated exposure control, adjustment of the mA and/or kV according to patient size and/or use of iterative reconstruction technique. COMPARISON:  08/01/2022 head CT FINDINGS: CT HEAD FINDINGS Brain: No evidence of acute infarction,  hemorrhage, hydrocephalus, extra-axial collection or mass lesion/mass effect. Generalized atrophy. Chronic small vessel ischemia in the cerebral white matter with small chronic left superior frontal infarct. Vascular: Atheromatous calcification. Skull: Normal. Negative for fracture or focal lesion. Sinuses/Orbits: No acute finding. CT CERVICAL SPINE FINDINGS Alignment: No traumatic malalignment Skull base and vertebrae: No acute fracture.  Generalized osteopenia Soft tissues and spinal canal: No prevertebral fluid or swelling. No visible  canal hematoma. Disc levels:  Generalized degenerative endplate and facet spurring Upper chest: No visible injury IMPRESSION: No evidence of acute intracranial or cervical spine injury. Electronically Signed   By: Tiburcio Pea M.D.   On: 03/11/2023 06:06    Procedures Procedures    Medications Ordered in ED Medications  Tdap (BOOSTRIX) injection 0.5 mL (has no administration in time range)  lactated ringers bolus 1,000 mL (has no administration in time range)    ED Course/ Medical Decision Making/ A&P                             Medical Decision Making Amount and/or Complexity of Data Reviewed Labs: ordered. Radiology: ordered.  Risk Prescription drug management.  Imaging of affected areas. Basic blood work.   Creatinine elevated, fluids given. No obvious traumatic injuries on my interpretation of ct head/neck. Will PO challenge, ambulate prior to d/c.  ***  {Document critical care time when appropriate:1} {Document review of labs and clinical decision tools ie heart score, Chads2Vasc2 etc:1}  {Document your independent review of radiology images, and any outside records:1} {Document your discussion with family members, caretakers, and with consultants:1} {Document social determinants of health affecting pt's care:1} {Document your decision making why or why not admission, treatments were needed:1} Final Clinical Impression(s) / ED  Diagnoses Final diagnoses:  None    Rx / DC Orders ED Discharge Orders     None

## 2023-03-11 NOTE — ED Provider Notes (Signed)
Repeat i-STAT was improved, creatinine from 1.8-1.6.  Patient is up walking with a walker and feels fine.  His son is now present and has no other concerns.  At this time patient appears stable for discharge.   Gwyneth Sprout, MD 03/11/23 [phone]

## 2023-04-03 ENCOUNTER — Ambulatory Visit (INDEPENDENT_AMBULATORY_CARE_PROVIDER_SITE_OTHER): Payer: Medicare Other | Admitting: Orthopaedic Surgery

## 2023-04-03 ENCOUNTER — Encounter: Payer: Self-pay | Admitting: Orthopaedic Surgery

## 2023-04-03 ENCOUNTER — Other Ambulatory Visit (INDEPENDENT_AMBULATORY_CARE_PROVIDER_SITE_OTHER): Payer: Medicare Other

## 2023-04-03 DIAGNOSIS — Z96642 Presence of left artificial hip joint: Secondary | ICD-10-CM

## 2023-04-03 NOTE — Progress Notes (Signed)
The patient is a 89 year old gentleman who is 5 months status post a left anterior hip replacement secondary to severe end-stage arthritis of his left hip that was debilitating in terms of the pain and the detrimental effect that was having on his mobility and his actives of daily living.  He does still ambulate cane but is doing well overall.  I did see had a recent fall.  He is on Plavix.  We have cautioned him about trying to be as steady with his gait as he can.  He denies any pain with either hip.  His right native hip moves smoothly and fluidly with no pain at all.  His left operative hip also move smoothly and fluidly with no pain.  An AP pelvis and lateral of the left hip shows a well-seated total hip arthroplasty with no complicating features.  There is no evidence of any fractures in his pelvis or his right hip based on his recent fall.  From a hip standpoint he can follow-up as needed.  I cautioned him about continuing to still ambulate with a cane given his advanced age and fall risk.  All questions and concerns were addressed and answered.

## 2023-04-10 ENCOUNTER — Encounter: Payer: Medicare Other | Admitting: Vascular Surgery

## 2023-04-10 ENCOUNTER — Other Ambulatory Visit: Payer: Self-pay | Admitting: Cardiovascular Disease

## 2023-04-10 DIAGNOSIS — R0989 Other specified symptoms and signs involving the circulatory and respiratory systems: Secondary | ICD-10-CM

## 2023-04-24 ENCOUNTER — Encounter: Payer: Medicare Other | Admitting: Vascular Surgery

## 2023-05-12 NOTE — Progress Notes (Unsigned)
[facility] [place]  ASSESSMENT / PLAN: [name] is a 89 y.o. male with a infrarenal abdominal aortic aneurysm measuring 33mm and left common iliac artery aneurysm measuring 25mm  Recommend:  Abstinence from all tobacco products. Blood glucose control with goal A1c < 7%. Blood pressure control with goal blood pressure < 140/90 mmHg. Lipid reduction therapy with goal LDL-C <100 mg/dL.  Aspirin 81mg  PO QD.  Atorvastatin 40-80mg  PO QD (or other "high intensity" statin therapy).  Follow up with me in 2-3 years with repeat abdominal aortic duplex  CHIEF COMPLAINT: Incidental discovery of abdominal aortic aneurysm  HISTORY OF PRESENT ILLNESS: [name] is a 89 y.o. male referred to clinic for evaluation of incidental discovery of abdominal aortic and left common iliac artery aneurysm.  These were discovered during a workup for back and abdominal pain.  Small infrarenal abdominal aortic aneurysm and moderate left common iliac artery aneurysm were identified.  The patient has had no pain since.  We reviewed his CT scan in detail.  He has a remote history of left carotid endarterectomy in April 2008 with [name].  Past Medical History:  Diagnosis Date   Arthritis    AV BLOCK, 1ST DEGREE    Carotid artery disease (HCC)    a. s/p Left ECA followed by [name]   Coronary artery disease    Coronary artery disease involving coronary bypass graft of native heart with angina pectoris (HCC)    a. LHC 12/2015  3vd Left Cx 100% with colaterals, and distally LAD occluded, DES to prox-mid RCA  b. 02/2017: repeat cath with patent RCA stent and stable CAD; 02/27/2017: Rota-PCI to Ost RI   GERD (gastroesophageal reflux disease)    HLD (hyperlipidemia)    HTN (hypertension)    Hypothyroidism    Myocardial infarction (HCC)    20 years   NSVT (nonsustained ventricular tachycardia) (HCC)    during stress test 12/2015   Presence of drug coated stent in RCA & Ramus Intermedius.  02/28/2017   12/2015: PCI p-mRCA - Stent Resolute Integ 3.5x34 02/2017: Rota-PCI o-mRamus - overlapping DES STENT SYNERGY DES 3X32  & 3x24 (tapered post-dilation)   Stroke [facility])     Past Surgical History:  Procedure Laterality Date   CARDIAC CATHETERIZATION     x2   CARDIAC CATHETERIZATION N/A 01/11/2016   Procedure: Left Heart Cath and Coronary Angiography;  Surgeon: Iran Ouch, MD;  Location: [facility] INVASIVE CV LAB;  Service: Cardiovascular;  Laterality: N/A;   CAROTID ENDARTERECTOMY  11/28/2006   left   CATARACT EXTRACTION W/ INTRAOCULAR LENS IMPLANT Bilateral    CORONARY ATHERECTOMY N/A 02/27/2017   Procedure: Coronary Atherectomy;  Surgeon: Lennette Bihari, MD;  Location: [facility] INVASIVE CV LAB;  Service: Cardiovascular;  Laterality: N/A;   HERNIA REPAIR     LEFT HEART CATH AND CORONARY ANGIOGRAPHY N/A 02/21/2017   Procedure: Left Heart Cath and Coronary Angiography;  Surgeon: Runell Gess, MD;  Location: [facility] INVASIVE CV LAB;  Service: Cardiovascular;  Laterality: N/A;   TONSILLECTOMY     TOTAL HIP ARTHROPLASTY Left 11/13/2022   Procedure: LEFT TOTAL HIP ARTHROPLASTY ANTERIOR APPROACH;  Surgeon: Kathryne Hitch, MD;  Location: [facility] OR;  Service: Orthopedics;  Laterality: Left;    Family History  Problem Relation Age of Onset   Diabetes Mother    Diabetes Sister    Cancer Sister 18       colon   Heart disease Sister  Social History   Socioeconomic History   Marital status: Widowed    Spouse name: Not on file   Number of children: Not on file   Years of education: Not on file   Highest education level: Not on file  Occupational History   Not on file  Tobacco Use   Smoking status: Never   Smokeless tobacco: Never  Vaping Use   Vaping status: Never Used  Substance and Sexual Activity   Alcohol use: Yes    Alcohol/week: 7.0 standard drinks of alcohol    Types: 7 Cans of beer per week    Comment: 7 cans/day   Drug use: No   Sexual activity: Not on file   Other Topics Concern   Not on file  Social History Narrative   Not on file   Social Determinants of Health   Financial Resource Strain: Low Risk  (08/31/2022)   Received from [facility], [facility]   Overall Financial Resource Strain (CARDIA)    Difficulty of Paying Living Expenses: Not very hard  Food Insecurity: No Food Insecurity (11/13/2022)   Hunger Vital Sign    Worried About Running Out of Food in the Last Year: Never true    Ran Out of Food in the Last Year: Never true  Transportation Needs: No Transportation Needs (11/13/2022)   PRAPARE - Administrator, Civil Service (Medical): No    Lack of Transportation (Non-Medical): No  Physical Activity: Unknown (02/06/2022)   Received from [facility], [facility]   Exercise Vital Sign    Days of Exercise per Week: 0 days    Minutes of Exercise per Session: Not on file  Stress: No Stress Concern Present (02/06/2022)   Received from [facility], [facility] of Occupational Health - Occupational Stress Questionnaire    Feeling of Stress : Not at all  Social Connections: Unknown (02/14/2023)   Received from [facility], [facility]   Social Network    Social Network: Not on file  Intimate Partner Violence: Unknown (02/14/2023)   Received from [facility], [facility]   HITS    Physically Hurt: Not on file    Insult or Talk Down To: Not on file    Threaten Physical Harm: Not on file    Scream or Curse: Not on file    Allergies  Allergen Reactions   Beta Adrenergic Blockers Other (See Comments)    Hx of AV block. Should avoid    Current Outpatient Medications  Medication Sig Dispense Refill   acetaminophen (TYLENOL) 500 MG tablet Take 1 tablet (500 mg total) by mouth every 6 (six) hours as needed for moderate pain (wrist). (Patient taking differently: Take 500 mg by mouth daily.) 30 tablet 0   allopurinol (ZYLOPRIM) 100 MG tablet Take 100 mg by mouth daily.     aspirin EC  81 MG EC tablet Take 1 tablet (81 mg total) by mouth daily.     b complex vitamins tablet Take 1 tablet by mouth daily.     Calcium Carbonate (CALCIUM 500 PO) Take 500 mg by mouth daily.     Calcium Carbonate Antacid (TUMS PO) Take 1 tablet by mouth as needed (heartburn).     Cholecalciferol (VITAMIN D-3) 1000 units CAPS Take 1,000 Units by mouth daily.     clopidogrel (PLAVIX) 75 MG tablet Take 1 tablet (75 mg total) by mouth daily. 90 tablet 0   ferrous sulfate 325 (65 FE) MG tablet Take  325 mg by mouth 2 (two) times daily with a meal.     folic acid (FOLVITE) 1 MG tablet Take 1 tablet (1 mg total) by mouth daily.     Glucosamine 500 MG CAPS Take 500 mg by mouth daily.     HYDROcodone-acetaminophen (NORCO/VICODIN) 5-325 MG tablet Take 1 tablet by mouth every 6 (six) hours as needed. 10 tablet 0   isosorbide mononitrate (IMDUR) 60 MG 24 hr tablet TAKE ONE AND ONE-HALF TABLETS DAILY 135 tablet 2   levothyroxine (SYNTHROID) 25 MCG tablet Take 25 mcg by mouth daily.     lidocaine (HM LIDOCAINE PATCH) 4 % Place 1 patch onto the skin daily. 10 patch 0   lisinopril (ZESTRIL) 20 MG tablet Take 1 tablet (20 mg total) by mouth daily. 90 tablet 3   Multiple Vitamin (MULTIVITAMIN) capsule Take 1 capsule by mouth daily.     nitroGLYCERIN (NITROSTAT) 0.4 MG SL tablet PLACE 1 TABLET UNDER THE TONGUE EVERY 5 MINUTES AS NEEDED FOR CHEST PAIN. NOT TO EXCEED 3 DOSES (Patient taking differently: Place 0.4 mg under the tongue every 5 (five) minutes as needed for chest pain.) 25 tablet 6   Omega-3 Fatty Acids (FISH OIL) 1000 MG CAPS Take 1,000 mg by mouth daily.     polyethylene glycol (MIRALAX) 17 g packet Take 17 g by mouth daily. 14 each 0   ranolazine (RANEXA) 1000 MG SR tablet TAKE 1 TABLET TWICE A DAY (Patient taking differently: Take 1,000 mg by mouth once.) 180 tablet 3   sertraline (ZOLOFT) 25 MG tablet Take 25 mg by mouth daily.     simvastatin (ZOCOR) 20 MG tablet TAKE 1 TABLET DAILY 90 tablet 3    vitamin C (ASCORBIC ACID) 500 MG tablet Take 500 mg by mouth daily.     zinc sulfate 220 (50 Zn) MG capsule Take 1 capsule (220 mg total) by mouth daily.     Current Facility-Administered Medications  Medication Dose Route Frequency Provider Last Rate Last Admin   0.9 %  sodium chloride infusion   Intravenous PRN Standley Brooking, MD       0.9 %  sodium chloride infusion   Intravenous PRN Blanchard Kelch, NP        PHYSICAL EXAM Vitals:   05/13/23 1423  BP: (!) 141/60  Pulse: (!) 50  Temp: 98 F (36.7 C)  TempSrc: Temporal  SpO2: 99%  Weight: 146 lb 4.8 oz (66.4 kg)  Height: 5\' 6"  (1.676 m)    Early man in no distress Regular rate and rhythm Unlabored breathing Soft nontender abdomen   PERTINENT LABORATORY AND RADIOLOGIC DATA  Most recent CBC    Latest Ref Rng & Units 03/11/2023    8:07 AM 03/11/2023    5:52 AM 03/11/2023    5:40 AM  CBC  WBC 4.0 - 10.5 K/uL   6.1   Hemoglobin 13.0 - 17.0 g/dL 9.2  16.1  9.8   Hematocrit 39.0 - 52.0 % 27.0  30.0  31.3   Platelets 150 - 400 K/uL   166      Most recent CMP    Latest Ref Rng & Units 03/11/2023    8:07 AM 03/11/2023    5:52 AM 03/11/2023    5:40 AM  CMP  Glucose 70 - 99 mg/dL 91  93  096   BUN 8 - 23 mg/dL 37  38  43   Creatinine 0.61 - 1.24 mg/dL 0.45  4.09  8.11   Sodium  135 - 145 mmol/L 136  135  134   Potassium 3.5 - 5.1 mmol/L 4.6  4.7  4.6   Chloride 98 - 111 mmol/L 106  107  105   CO2 22 - 32 mmol/L   17   Calcium 8.9 - 10.3 mg/dL   9.1   Total Protein 6.5 - 8.1 g/dL   6.1   Total Bilirubin 0.3 - 1.2 mg/dL   1.1   Alkaline Phos 38 - 126 U/L   82   AST 15 - 41 U/L   23   ALT 0 - 44 U/L   17     Renal function CrCl cannot be calculated (Patient's most recent lab result is older than the maximum 21 days allowed.).  Hgb A1c MFr Bld (%)  Date Value  01/11/2016 6.0 (H)    LDL Cholesterol  Date Value Ref Range Status  02/21/2017 53 0 - 99 mg/dL Final    Comment:           Total  Cholesterol/HDL:CHD Risk Coronary Heart Disease Risk Table                     Men   Women  1/2 Average Risk   3.4   3.3  Average Risk       5.0   4.4  2 X Average Risk   9.6   7.1  3 X Average Risk  23.4   11.0        Use the calculated Patient Ratio above and the CHD Risk Table to determine the patient's CHD Risk.        ATP III CLASSIFICATION (LDL):  <100     mg/dL   Optimal  782-956  mg/dL   Near or Above                    Optimal  130-159  mg/dL   Borderline  213-086  mg/dL   High  >578     mg/dL   Very High     CT angiogram 02/18/2023 personally reviewed. Small infrarenal abdominal aortic aneurysm measuring 30 mm without signs of rupture Left common iliac artery aneurysm which is saccular.  This is 25 mm and has no other worrisome features.  Rande Brunt. Lenell Antu, MD FACS [facility] Phone Number: [phone] 05/13/2023 3:09 PM   Total time spent on preparing this encounter including chart review, data review, collecting history, examining the patient, coordinating care for this new patient, 60 minutes.  Portions of this report may have been transcribed using voice recognition software.  Every effort has been made to ensure accuracy; however, inadvertent computerized transcription errors may still be present.

## 2023-05-13 ENCOUNTER — Ambulatory Visit (INDEPENDENT_AMBULATORY_CARE_PROVIDER_SITE_OTHER): Payer: Medicare Other | Admitting: Vascular Surgery

## 2023-05-13 VITALS — BP 141/60 | HR 50 | Temp 98.0°F | Ht 66.0 in | Wt 146.3 lb

## 2023-05-13 DIAGNOSIS — I723 Aneurysm of iliac artery: Secondary | ICD-10-CM

## 2023-05-13 DIAGNOSIS — I7143 Infrarenal abdominal aortic aneurysm, without rupture: Secondary | ICD-10-CM

## 2023-09-24 ENCOUNTER — Encounter (INDEPENDENT_AMBULATORY_CARE_PROVIDER_SITE_OTHER): Payer: Medicare Other | Admitting: Ophthalmology

## 2023-09-24 DIAGNOSIS — H43813 Vitreous degeneration, bilateral: Secondary | ICD-10-CM

## 2023-09-24 DIAGNOSIS — H353132 Nonexudative age-related macular degeneration, bilateral, intermediate dry stage: Secondary | ICD-10-CM

## 2023-09-24 DIAGNOSIS — D3132 Benign neoplasm of left choroid: Secondary | ICD-10-CM | POA: Diagnosis not present

## 2023-09-24 DIAGNOSIS — H35033 Hypertensive retinopathy, bilateral: Secondary | ICD-10-CM

## 2023-09-24 DIAGNOSIS — I10 Essential (primary) hypertension: Secondary | ICD-10-CM

## 2023-09-30 ENCOUNTER — Encounter (HOSPITAL_COMMUNITY): Payer: Self-pay

## 2023-09-30 ENCOUNTER — Other Ambulatory Visit: Payer: Self-pay

## 2023-09-30 ENCOUNTER — Inpatient Hospital Stay (HOSPITAL_COMMUNITY)
Admission: EM | Admit: 2023-09-30 | Discharge: 2023-10-05 | DRG: 291 | Disposition: A | Discharge status: Skilled Nursing Facility | Payer: Medicare Other | Attending: Internal Medicine | Admitting: Internal Medicine

## 2023-09-30 ENCOUNTER — Emergency Department (HOSPITAL_COMMUNITY): Payer: Medicare Other

## 2023-09-30 DIAGNOSIS — Z79890 Hormone replacement therapy (postmenopausal): Secondary | ICD-10-CM

## 2023-09-30 DIAGNOSIS — I110 Hypertensive heart disease with heart failure: Principal | ICD-10-CM | POA: Diagnosis present

## 2023-09-30 DIAGNOSIS — Z8249 Family history of ischemic heart disease and other diseases of the circulatory system: Secondary | ICD-10-CM

## 2023-09-30 DIAGNOSIS — I509 Heart failure, unspecified: Secondary | ICD-10-CM | POA: Diagnosis not present

## 2023-09-30 DIAGNOSIS — I7121 Aneurysm of the ascending aorta, without rupture: Secondary | ICD-10-CM | POA: Diagnosis present

## 2023-09-30 DIAGNOSIS — I2510 Atherosclerotic heart disease of native coronary artery without angina pectoris: Secondary | ICD-10-CM | POA: Diagnosis present

## 2023-09-30 DIAGNOSIS — K219 Gastro-esophageal reflux disease without esophagitis: Secondary | ICD-10-CM | POA: Diagnosis present

## 2023-09-30 DIAGNOSIS — Z79899 Other long term (current) drug therapy: Secondary | ICD-10-CM

## 2023-09-30 DIAGNOSIS — Z955 Presence of coronary angioplasty implant and graft: Secondary | ICD-10-CM

## 2023-09-30 DIAGNOSIS — E039 Hypothyroidism, unspecified: Secondary | ICD-10-CM | POA: Insufficient documentation

## 2023-09-30 DIAGNOSIS — Z8673 Personal history of transient ischemic attack (TIA), and cerebral infarction without residual deficits: Secondary | ICD-10-CM

## 2023-09-30 DIAGNOSIS — Z1152 Encounter for screening for COVID-19: Secondary | ICD-10-CM

## 2023-09-30 DIAGNOSIS — I4891 Unspecified atrial fibrillation: Secondary | ICD-10-CM | POA: Diagnosis present

## 2023-09-30 DIAGNOSIS — E785 Hyperlipidemia, unspecified: Secondary | ICD-10-CM | POA: Diagnosis present

## 2023-09-30 DIAGNOSIS — Z961 Presence of intraocular lens: Secondary | ICD-10-CM | POA: Diagnosis present

## 2023-09-30 DIAGNOSIS — I5033 Acute on chronic diastolic (congestive) heart failure: Secondary | ICD-10-CM | POA: Diagnosis present

## 2023-09-30 DIAGNOSIS — R296 Repeated falls: Secondary | ICD-10-CM | POA: Diagnosis present

## 2023-09-30 DIAGNOSIS — Z7902 Long term (current) use of antithrombotics/antiplatelets: Secondary | ICD-10-CM

## 2023-09-30 DIAGNOSIS — Z7982 Long term (current) use of aspirin: Secondary | ICD-10-CM

## 2023-09-30 DIAGNOSIS — I252 Old myocardial infarction: Secondary | ICD-10-CM

## 2023-09-30 DIAGNOSIS — I10 Essential (primary) hypertension: Secondary | ICD-10-CM | POA: Diagnosis present

## 2023-09-30 DIAGNOSIS — Z833 Family history of diabetes mellitus: Secondary | ICD-10-CM

## 2023-09-30 DIAGNOSIS — I4821 Permanent atrial fibrillation: Secondary | ICD-10-CM

## 2023-09-30 DIAGNOSIS — Z96642 Presence of left artificial hip joint: Secondary | ICD-10-CM | POA: Diagnosis present

## 2023-09-30 DIAGNOSIS — I340 Nonrheumatic mitral (valve) insufficiency: Secondary | ICD-10-CM | POA: Diagnosis present

## 2023-09-30 DIAGNOSIS — Z91148 Patient's other noncompliance with medication regimen for other reason: Secondary | ICD-10-CM

## 2023-09-30 DIAGNOSIS — I160 Hypertensive urgency: Secondary | ICD-10-CM | POA: Insufficient documentation

## 2023-09-30 LAB — BASIC METABOLIC PANEL
Anion gap: 14 (ref 5–15)
BUN: 25 mg/dL — ABNORMAL HIGH (ref 8–23)
CO2: 19 mmol/L — ABNORMAL LOW (ref 22–32)
Calcium: 8.6 mg/dL — ABNORMAL LOW (ref 8.9–10.3)
Chloride: 104 mmol/L (ref 98–111)
Creatinine, Ser: 1.05 mg/dL (ref 0.61–1.24)
GFR, Estimated: >60 mL/min (ref >60)
Glucose, Bld: 96 mg/dL (ref 70–99)
Potassium: 3.8 mmol/L (ref 3.5–5.1)
Sodium: 137 mmol/L (ref 135–145)

## 2023-09-30 LAB — CBC
HCT: 34.5 % — ABNORMAL LOW (ref 39.0–52.0)
Hemoglobin: 11.2 g/dL — ABNORMAL LOW (ref 13.0–17.0)
MCH: 32.2 pg (ref 26.0–34.0)
MCHC: 32.5 g/dL (ref 30.0–36.0)
MCV: 99.1 fL (ref 80.0–100.0)
Platelets: 154 10*3/uL (ref 150–400)
RBC: 3.48 MIL/uL — ABNORMAL LOW (ref 4.22–5.81)
RDW: 16.2 % — ABNORMAL HIGH (ref 11.5–15.5)
WBC: 7.3 10*3/uL (ref 4.0–10.5)
nRBC: 0.6 % — ABNORMAL HIGH (ref 0.0–0.2)

## 2023-09-30 LAB — RESP PANEL BY RT-PCR (RSV, FLU A&B, COVID)  RVPGX2
Influenza A by PCR: NEGATIVE
Influenza B by PCR: NEGATIVE
Resp Syncytial Virus by PCR: NEGATIVE
SARS Coronavirus 2 by RT PCR: NEGATIVE

## 2023-09-30 LAB — CBG MONITORING, ED: Glucose-Capillary: 139 mg/dL — ABNORMAL HIGH (ref 70–99)

## 2023-09-30 NOTE — ED Provider Triage Note (Signed)
Emergency Medicine Provider Triage Evaluation Note  [name] , a 89 y.o. male  was evaluated in triage.  Pt complains of 3 day Hx of lower extremity weakness. Had previously been able to walk with a cane 3 days ago but now has not ambulated since. Denies falls.   Endorses increased urination.  Denies fevers, headaches, ear pain, blurry vision, vertigo, chest pain, shortness of breath, abdominal pain, flank pain, n/v/d, LE pain, LE numbness.   Review of Systems  Positive: N/a Negative: N/a  Physical Exam  BP (!) 214/116 (BP Location: Right Arm)   Pulse (!) 56   Temp (!) 97.4 F (36.3 C) (Oral)   Resp 18   Ht 5\' 6"  (1.676 m)   Wt 66.4 kg   SpO2 95%   BMI 23.63 kg/m  Gen:   Awake, no distress   Resp:  Normal effort  MSK:   Moves extremities without difficulty  Other:  A/Ox 3  Medical Decision Making  Medically screening exam initiated at 3:38 PM.  Appropriate orders placed.  [name] was informed that the remainder of the evaluation will be completed by another provider, this initial triage assessment does not replace that evaluation, and the importance of remaining in the ED until their evaluation is complete.     [name], [place] 09/30/23 [phone]

## 2023-09-30 NOTE — ED Triage Notes (Signed)
 Weakness for a week, noncompliant with home medications for the last month. Pt states he did not want to take his meds. He only took his cholesterol meds.

## 2023-10-01 ENCOUNTER — Emergency Department (HOSPITAL_COMMUNITY): Payer: Medicare Other

## 2023-10-01 ENCOUNTER — Inpatient Hospital Stay (HOSPITAL_COMMUNITY): Payer: Medicare Other

## 2023-10-01 ENCOUNTER — Encounter (HOSPITAL_COMMUNITY): Payer: Self-pay

## 2023-10-01 DIAGNOSIS — I160 Hypertensive urgency: Secondary | ICD-10-CM | POA: Diagnosis present

## 2023-10-01 DIAGNOSIS — Z833 Family history of diabetes mellitus: Secondary | ICD-10-CM | POA: Diagnosis not present

## 2023-10-01 DIAGNOSIS — Z79890 Hormone replacement therapy (postmenopausal): Secondary | ICD-10-CM | POA: Diagnosis not present

## 2023-10-01 DIAGNOSIS — Z8673 Personal history of transient ischemic attack (TIA), and cerebral infarction without residual deficits: Secondary | ICD-10-CM | POA: Diagnosis not present

## 2023-10-01 DIAGNOSIS — R296 Repeated falls: Secondary | ICD-10-CM | POA: Diagnosis present

## 2023-10-01 DIAGNOSIS — I10 Essential (primary) hypertension: Secondary | ICD-10-CM | POA: Diagnosis not present

## 2023-10-01 DIAGNOSIS — Z961 Presence of intraocular lens: Secondary | ICD-10-CM | POA: Diagnosis present

## 2023-10-01 DIAGNOSIS — E039 Hypothyroidism, unspecified: Secondary | ICD-10-CM | POA: Insufficient documentation

## 2023-10-01 DIAGNOSIS — Z955 Presence of coronary angioplasty implant and graft: Secondary | ICD-10-CM | POA: Diagnosis not present

## 2023-10-01 DIAGNOSIS — I4821 Permanent atrial fibrillation: Secondary | ICD-10-CM | POA: Diagnosis present

## 2023-10-01 DIAGNOSIS — I252 Old myocardial infarction: Secondary | ICD-10-CM | POA: Diagnosis not present

## 2023-10-01 DIAGNOSIS — Z7982 Long term (current) use of aspirin: Secondary | ICD-10-CM | POA: Diagnosis not present

## 2023-10-01 DIAGNOSIS — I340 Nonrheumatic mitral (valve) insufficiency: Secondary | ICD-10-CM | POA: Diagnosis present

## 2023-10-01 DIAGNOSIS — I5033 Acute on chronic diastolic (congestive) heart failure: Secondary | ICD-10-CM | POA: Diagnosis present

## 2023-10-01 DIAGNOSIS — I509 Heart failure, unspecified: Secondary | ICD-10-CM

## 2023-10-01 DIAGNOSIS — Z96642 Presence of left artificial hip joint: Secondary | ICD-10-CM | POA: Diagnosis present

## 2023-10-01 DIAGNOSIS — I110 Hypertensive heart disease with heart failure: Secondary | ICD-10-CM | POA: Diagnosis present

## 2023-10-01 DIAGNOSIS — Z79899 Other long term (current) drug therapy: Secondary | ICD-10-CM | POA: Diagnosis not present

## 2023-10-01 DIAGNOSIS — Z91148 Patient's other noncompliance with medication regimen for other reason: Secondary | ICD-10-CM | POA: Diagnosis not present

## 2023-10-01 DIAGNOSIS — E785 Hyperlipidemia, unspecified: Secondary | ICD-10-CM | POA: Diagnosis present

## 2023-10-01 DIAGNOSIS — I7121 Aneurysm of the ascending aorta, without rupture: Secondary | ICD-10-CM | POA: Diagnosis present

## 2023-10-01 DIAGNOSIS — I2510 Atherosclerotic heart disease of native coronary artery without angina pectoris: Secondary | ICD-10-CM | POA: Diagnosis present

## 2023-10-01 DIAGNOSIS — Z8249 Family history of ischemic heart disease and other diseases of the circulatory system: Secondary | ICD-10-CM | POA: Diagnosis not present

## 2023-10-01 DIAGNOSIS — Z7902 Long term (current) use of antithrombotics/antiplatelets: Secondary | ICD-10-CM | POA: Diagnosis not present

## 2023-10-01 DIAGNOSIS — K219 Gastro-esophageal reflux disease without esophagitis: Secondary | ICD-10-CM | POA: Diagnosis present

## 2023-10-01 DIAGNOSIS — Z1152 Encounter for screening for COVID-19: Secondary | ICD-10-CM | POA: Diagnosis not present

## 2023-10-01 LAB — HEPATIC FUNCTION PANEL
ALT: 29 U/L (ref 0–44)
AST: 51 U/L — ABNORMAL HIGH (ref 15–41)
Albumin: 3.7 g/dL (ref 3.5–5.0)
Alkaline Phosphatase: 90 U/L (ref 38–126)
Bilirubin, Direct: 0.4 mg/dL — ABNORMAL HIGH (ref 0.0–0.2)
Indirect Bilirubin: 1.1 mg/dL — ABNORMAL HIGH (ref 0.3–0.9)
Total Bilirubin: 1.5 mg/dL — ABNORMAL HIGH (ref 0.0–1.2)
Total Protein: 6.4 g/dL — ABNORMAL LOW (ref 6.5–8.1)

## 2023-10-01 LAB — STREP PNEUMONIAE URINARY ANTIGEN: Strep Pneumo Urinary Antigen: NEGATIVE

## 2023-10-01 LAB — URINALYSIS, ROUTINE W REFLEX MICROSCOPIC
Bilirubin Urine: NEGATIVE
Glucose, UA: NEGATIVE mg/dL
Hgb urine dipstick: NEGATIVE
Ketones, ur: NEGATIVE mg/dL
Leukocytes,Ua: NEGATIVE
Nitrite: NEGATIVE
Protein, ur: NEGATIVE mg/dL
Specific Gravity, Urine: 1.01 (ref 1.005–1.030)
pH: 6 (ref 5.0–8.0)

## 2023-10-01 LAB — LIPID PANEL
Cholesterol: 107 mg/dL (ref 0–200)
HDL: 40 mg/dL — ABNORMAL LOW (ref >40)
LDL Cholesterol: 55 mg/dL (ref 0–99)
Total CHOL/HDL Ratio: 2.7 {ratio}
Triglycerides: 60 mg/dL (ref <150)
VLDL: 12 mg/dL (ref 0–40)

## 2023-10-01 LAB — CBC
HCT: 31.8 % — ABNORMAL LOW (ref 39.0–52.0)
Hemoglobin: 10.4 g/dL — ABNORMAL LOW (ref 13.0–17.0)
MCH: 32.4 pg (ref 26.0–34.0)
MCHC: 32.7 g/dL (ref 30.0–36.0)
MCV: 99.1 fL (ref 80.0–100.0)
Platelets: 150 10*3/uL (ref 150–400)
RBC: 3.21 MIL/uL — ABNORMAL LOW (ref 4.22–5.81)
RDW: 16 % — ABNORMAL HIGH (ref 11.5–15.5)
WBC: 8.2 10*3/uL (ref 4.0–10.5)
nRBC: 0.2 % (ref 0.0–0.2)

## 2023-10-01 LAB — ECHOCARDIOGRAM COMPLETE
AR max vel: 2.33 cm2
AV Peak grad: 10.9 mm[Hg]
Ao pk vel: 1.65 m/s
Area-P 1/2: 4.49 cm2
Height: 66 in
MV M vel: 6.29 m/s
MV Peak grad: 158.3 mm[Hg]
P 1/2 time: 678 ms
Radius: 0.6 cm
S' Lateral: 2.5 cm
Single Plane A4C EF: 61.1 %
Weight: 2342.17 [oz_av]

## 2023-10-01 LAB — BASIC METABOLIC PANEL
Anion gap: 14 (ref 5–15)
BUN: 26 mg/dL — ABNORMAL HIGH (ref 8–23)
CO2: 22 mmol/L (ref 22–32)
Calcium: 8.7 mg/dL — ABNORMAL LOW (ref 8.9–10.3)
Chloride: 106 mmol/L (ref 98–111)
Creatinine, Ser: 1 mg/dL (ref 0.61–1.24)
GFR, Estimated: >60 mL/min (ref >60)
Glucose, Bld: 95 mg/dL (ref 70–99)
Potassium: 3.1 mmol/L — ABNORMAL LOW (ref 3.5–5.1)
Sodium: 142 mmol/L (ref 135–145)

## 2023-10-01 LAB — TSH: TSH: 4.363 u[IU]/mL (ref 0.350–4.500)

## 2023-10-01 LAB — TROPONIN I (HIGH SENSITIVITY)
Troponin I (High Sensitivity): 37 ng/L — ABNORMAL HIGH (ref <18)
Troponin I (High Sensitivity): 39 ng/L — ABNORMAL HIGH (ref <18)

## 2023-10-01 LAB — D-DIMER, QUANTITATIVE: D-Dimer, Quant: 0.96 ug{FEU}/mL — ABNORMAL HIGH (ref 0.00–0.50)

## 2023-10-01 LAB — HEMOGLOBIN A1C
Hgb A1c MFr Bld: 5.4 % (ref 4.8–5.6)
Mean Plasma Glucose: 108.28 mg/dL

## 2023-10-01 LAB — MAGNESIUM: Magnesium: 1.7 mg/dL (ref 1.7–2.4)

## 2023-10-01 LAB — VITAMIN B12: Vitamin B-12: 1007 pg/mL — ABNORMAL HIGH (ref 180–914)

## 2023-10-01 LAB — PROCALCITONIN: Procalcitonin: <0.1 ng/mL

## 2023-10-01 LAB — BRAIN NATRIURETIC PEPTIDE: B Natriuretic Peptide: 2995.3 pg/mL — ABNORMAL HIGH (ref 0.0–100.0)

## 2023-10-01 MED ORDER — IRBESARTAN 150 MG PO TABS
75.0000 mg | ORAL_TABLET | Freq: Every day | ORAL | Status: DC
  Administered 2023-10-01 – 2023-10-05 (×5): 75 mg via ORAL
  Filled 2023-10-01 (×6): qty 1

## 2023-10-01 MED ORDER — FUROSEMIDE 10 MG/ML IJ SOLN
20.0000 mg | Freq: Once | INTRAMUSCULAR | Status: AC
  Administered 2023-10-01: 20 mg via INTRAVENOUS
  Filled 2023-10-01: qty 4

## 2023-10-01 MED ORDER — IOHEXOL 350 MG/ML SOLN
75.0000 mL | Freq: Once | INTRAVENOUS | Status: AC | PRN
  Administered 2023-10-01: 75 mL via INTRAVENOUS

## 2023-10-01 MED ORDER — POLYETHYLENE GLYCOL 3350 17 G PO PACK: 17.0000 g | PACK | Freq: Every day | ORAL | Status: DC | PRN

## 2023-10-01 MED ORDER — ISOSORBIDE MONONITRATE ER 60 MG PO TB24
60.0000 mg | ORAL_TABLET | Freq: Every day | ORAL | Status: DC
  Administered 2023-10-01 – 2023-10-05 (×5): 60 mg via ORAL
  Filled 2023-10-01 (×5): qty 1

## 2023-10-01 MED ORDER — LISINOPRIL 10 MG PO TABS: 40.0000 mg | ORAL_TABLET | Freq: Every day | ORAL | Status: DC

## 2023-10-01 MED ORDER — ACETAMINOPHEN 650 MG RE SUPP: 650.0000 mg | Freq: Four times a day (QID) | RECTAL | Status: DC | PRN

## 2023-10-01 MED ORDER — ALLOPURINOL 100 MG PO TABS
100.0000 mg | ORAL_TABLET | Freq: Every day | ORAL | Status: DC
  Administered 2023-10-01 – 2023-10-05 (×5): 100 mg via ORAL
  Filled 2023-10-01 (×5): qty 1

## 2023-10-01 MED ORDER — ASPIRIN 81 MG PO TBEC
81.0000 mg | DELAYED_RELEASE_TABLET | Freq: Every day | ORAL | Status: DC
  Administered 2023-10-01 – 2023-10-05 (×5): 81 mg via ORAL
  Filled 2023-10-01 (×5): qty 1

## 2023-10-01 MED ORDER — SODIUM CHLORIDE 0.9 % IV SOLN
2.0000 g | INTRAVENOUS | Status: DC
  Administered 2023-10-01 – 2023-10-03 (×3): 2 g via INTRAVENOUS
  Filled 2023-10-01 (×3): qty 20

## 2023-10-01 MED ORDER — SERTRALINE HCL 25 MG PO TABS
25.0000 mg | ORAL_TABLET | Freq: Every day | ORAL | Status: DC
  Administered 2023-10-01 – 2023-10-05 (×5): 25 mg via ORAL
  Filled 2023-10-01 (×5): qty 1

## 2023-10-01 MED ORDER — FUROSEMIDE 10 MG/ML IJ SOLN
40.0000 mg | Freq: Every day | INTRAMUSCULAR | Status: DC
Start: 2023-10-01 — End: 2023-10-03
  Administered 2023-10-01 – 2023-10-02 (×2): 40 mg via INTRAVENOUS
  Filled 2023-10-01 (×3): qty 4

## 2023-10-01 MED ORDER — NITROGLYCERIN 0.4 MG SL SUBL: 0.4000 mg | SUBLINGUAL_TABLET | SUBLINGUAL | Status: DC | PRN

## 2023-10-01 MED ORDER — HYDRALAZINE HCL 20 MG/ML IJ SOLN: 10.0000 mg | Freq: Four times a day (QID) | INTRAMUSCULAR | Status: DC | PRN

## 2023-10-01 MED ORDER — PANTOPRAZOLE SODIUM 40 MG PO TBEC
40.0000 mg | DELAYED_RELEASE_TABLET | Freq: Every day | ORAL | Status: DC
  Administered 2023-10-01 – 2023-10-05 (×5): 40 mg via ORAL
  Filled 2023-10-01 (×5): qty 1

## 2023-10-01 MED ORDER — MELATONIN 3 MG PO TABS: 3.0000 mg | ORAL_TABLET | Freq: Every evening | ORAL | Status: DC | PRN

## 2023-10-01 MED ORDER — AMLODIPINE BESYLATE 5 MG PO TABS
2.5000 mg | ORAL_TABLET | Freq: Once | ORAL | Status: AC
  Administered 2023-10-01: 2.5 mg via ORAL
  Filled 2023-10-01: qty 1

## 2023-10-01 MED ORDER — SIMVASTATIN 20 MG PO TABS
20.0000 mg | ORAL_TABLET | Freq: Every day | ORAL | Status: DC
  Administered 2023-10-01 – 2023-10-05 (×5): 20 mg via ORAL
  Filled 2023-10-01: qty 1
  Filled 2023-10-01: qty 2
  Filled 2023-10-01 (×2): qty 1
  Filled 2023-10-01: qty 2

## 2023-10-01 MED ORDER — AMLODIPINE BESYLATE 5 MG PO TABS
5.0000 mg | ORAL_TABLET | Freq: Every day | ORAL | Status: DC
  Administered 2023-10-01 – 2023-10-02 (×2): 5 mg via ORAL
  Filled 2023-10-01 (×3): qty 1

## 2023-10-01 MED ORDER — LISINOPRIL 10 MG PO TABS
10.0000 mg | ORAL_TABLET | Freq: Every day | ORAL | Status: DC
  Administered 2023-10-01: 10 mg via ORAL
  Filled 2023-10-01: qty 1

## 2023-10-01 MED ORDER — SODIUM CHLORIDE (PF) 0.9 % IJ SOLN
INTRAMUSCULAR | Status: AC
  Filled 2023-10-01: qty 50

## 2023-10-01 MED ORDER — LEVOTHYROXINE SODIUM 25 MCG PO TABS
25.0000 ug | ORAL_TABLET | Freq: Every day | ORAL | Status: DC
  Administered 2023-10-02 – 2023-10-05 (×4): 25 ug via ORAL
  Filled 2023-10-01 (×4): qty 1

## 2023-10-01 MED ORDER — ALBUTEROL SULFATE (2.5 MG/3ML) 0.083% IN NEBU: 2.5000 mg | INHALATION_SOLUTION | RESPIRATORY_TRACT | Status: DC | PRN

## 2023-10-01 MED ORDER — ENSURE ENLIVE PO LIQD
237.0000 mL | Freq: Three times a day (TID) | ORAL | Status: DC
  Administered 2023-10-01 – 2023-10-05 (×9): 237 mL via ORAL
  Filled 2023-10-01 (×4): qty 237

## 2023-10-01 MED ORDER — CLOPIDOGREL BISULFATE 75 MG PO TABS
75.0000 mg | ORAL_TABLET | Freq: Every day | ORAL | Status: DC
  Administered 2023-10-01 – 2023-10-05 (×5): 75 mg via ORAL
  Filled 2023-10-01 (×5): qty 1

## 2023-10-01 MED ORDER — HYDRALAZINE HCL 20 MG/ML IJ SOLN
5.0000 mg | Freq: Once | INTRAMUSCULAR | Status: AC
  Administered 2023-10-01: 5 mg via INTRAVENOUS
  Filled 2023-10-01: qty 1

## 2023-10-01 MED ORDER — ACETAMINOPHEN 325 MG PO TABS: 650.0000 mg | ORAL_TABLET | Freq: Four times a day (QID) | ORAL | Status: DC | PRN

## 2023-10-01 MED ORDER — FUROSEMIDE 10 MG/ML IJ SOLN: 40.0000 mg | Freq: Once | INTRAMUSCULAR | Status: DC

## 2023-10-01 MED ORDER — MAGNESIUM SULFATE 2 GM/50ML IV SOLN
2.0000 g | Freq: Once | INTRAVENOUS | Status: AC
  Administered 2023-10-01: 2 g via INTRAVENOUS
  Filled 2023-10-01: qty 50

## 2023-10-01 MED ORDER — POTASSIUM CHLORIDE CRYS ER 20 MEQ PO TBCR
40.0000 meq | EXTENDED_RELEASE_TABLET | Freq: Two times a day (BID) | ORAL | Status: AC
  Administered 2023-10-01: 40 meq via ORAL
  Filled 2023-10-01: qty 2

## 2023-10-01 MED ORDER — ENOXAPARIN SODIUM 40 MG/0.4ML IJ SOSY
40.0000 mg | PREFILLED_SYRINGE | INTRAMUSCULAR | Status: DC
  Administered 2023-10-01 – 2023-10-04 (×4): 40 mg via SUBCUTANEOUS
  Filled 2023-10-01 (×5): qty 0.4

## 2023-10-01 NOTE — ED Provider Notes (Signed)
[place] [facility]MENT AT [facility] Provider Note   CSN: [id number] Arrival date & time: 09/30/23  1513     History  Chief Complaint  Patient presents with   Weakness    [name] is a 89 y.o. male.  The history is provided by the patient, a relative and medical records.  Weakness [name] is a 89 y.o. male who presents to the Emergency Department complaining of weakness.  He presents to the emergency department for evaluation of 1 week of generalized weakness.  He lives with his daughter.  They report that he is sleeping more than usual and less active than baseline.  Over the last few days he has been unsteady on his feet and breathing heavier than baseline.  He has a cough that is worse at night.  No associated fever, chest pain, headache, abdominal pain.  He did recently go to [place] 2 weeks ago for the funeral of a family member.  He is compliant with his thyroid medication but daughters cannot confirm compliance with his other medications and they have concern that he is not taking these appropriately.      Home Medications Prior to Admission medications   Medication Sig Start Date End Date Taking? Authorizing Provider  azithromycin (ZITHROMAX) 250 MG tablet Take 250 mg by mouth as directed. 09/03/23  Yes [provider]  colchicine 0.6 MG tablet Take 0.6 mg by mouth daily as needed (Gout flare up). 07/09/23  Yes [provider]  finasteride (PROSCAR) 5 MG tablet Take 5 mg by mouth daily. 07/09/23 07/08/24 Yes [provider]  acetaminophen (TYLENOL) 500 MG tablet Take 1 tablet (500 mg total) by mouth every 6 (six) hours as needed for moderate pain (wrist). Patient taking differently: Take 500 mg by mouth daily. 02/28/17   Arty Baumgartner, NP  allopurinol (ZYLOPRIM) 100 MG tablet Take 100 mg by mouth daily. 02/06/22   [provider]  aspirin EC 81 MG EC tablet Take 1 tablet (81 mg total) by mouth daily. 01/12/16    Arty Baumgartner, NP  b complex vitamins tablet Take 1 tablet by mouth daily.    [provider]  Calcium Carbonate (CALCIUM 500 PO) Take 500 mg by mouth daily.    [provider]  Calcium Carbonate Antacid (TUMS PO) Take 1 tablet by mouth as needed (heartburn).    [provider]  Cholecalciferol (VITAMIN D-3) 1000 units CAPS Take 1,000 Units by mouth daily.    [provider]  clopidogrel (PLAVIX) 75 MG tablet Take 1 tablet (75 mg total) by mouth daily. 04/10/23   Wendall Stade, MD  ferrous sulfate 325 (65 FE) MG tablet Take 325 mg by mouth 2 (two) times daily with a meal.    [provider]  folic acid (FOLVITE) 1 MG tablet Take 1 tablet (1 mg total) by mouth daily. 12/19/20   Standley Brooking, MD  Glucosamine 500 MG CAPS Take 500 mg by mouth daily.    [provider]  HYDROcodone-acetaminophen (NORCO/VICODIN) 5-325 MG tablet Take 1 tablet by mouth every 6 (six) hours as needed. 02/18/23   Small, Brooke L, PA  isosorbide mononitrate (IMDUR) 60 MG 24 hr tablet TAKE ONE AND ONE-HALF TABLETS DAILY 11/06/22   Wendall Stade, MD  levothyroxine (SYNTHROID) 25 MCG tablet Take 25 mcg by mouth daily. 03/23/22   [provider]  lidocaine (HM LIDOCAINE PATCH) 4 % Place 1 patch onto the skin  daily. 02/18/23   Small, Brooke L, PA  lisinopril (ZESTRIL) 20 MG tablet Take 1 tablet (20 mg total) by mouth daily. 06/13/22 06/14/23  Swinyer, Zachary George, NP  Multiple Vitamin (MULTIVITAMIN) capsule Take 1 capsule by mouth daily.    [provider]  nitroGLYCERIN (NITROSTAT) 0.4 MG SL tablet PLACE 1 TABLET UNDER THE TONGUE EVERY 5 MINUTES AS NEEDED FOR CHEST PAIN. NOT TO EXCEED 3 DOSES Patient taking differently: Place 0.4 mg under the tongue every 5 (five) minutes as needed for chest pain. 06/02/20   Wendall Stade, MD  Omega-3 Fatty Acids (FISH OIL) 1000 MG CAPS Take 1,000 mg by mouth daily.    [provider]  polyethylene glycol  (MIRALAX) 17 g packet Take 17 g by mouth daily. 02/18/23   Small, Brooke L, PA  ranolazine (RANEXA) 1000 MG SR tablet TAKE 1 TABLET TWICE A DAY Patient taking differently: Take 1,000 mg by mouth once. 04/10/22   Wendall Stade, MD  sertraline (ZOLOFT) 25 MG tablet Take 25 mg by mouth daily.    [provider]  simvastatin (ZOCOR) 20 MG tablet TAKE 1 TABLET DAILY 11/21/22   Wendall Stade, MD  vitamin C (ASCORBIC ACID) 500 MG tablet Take 500 mg by mouth daily.    [provider]  zinc sulfate 220 (50 Zn) MG capsule Take 1 capsule (220 mg total) by mouth daily. 12/19/20   Standley Brooking, MD      Allergies    Beta adrenergic blockers    Review of Systems   Review of Systems  Neurological:  Positive for weakness.  All other systems reviewed and are negative.   Physical Exam Updated Vital Signs BP (!) 163/106   Pulse (!) 59   Temp 98.6 F (37 C) (Oral)   Resp (!) 33   Ht 5\' 6"  (1.676 m)   Wt 66.4 kg   SpO2 99%   BMI 23.63 kg/m  Physical Exam Vitals and nursing note reviewed.  Constitutional:      Appearance: He is well-developed.  HENT:     Head: Normocephalic and atraumatic.  Cardiovascular:     Rate and Rhythm: Normal rate and regular rhythm.     Heart sounds: No murmur heard. Pulmonary:     Effort: Pulmonary effort is normal. No respiratory distress.     Breath sounds: Normal breath sounds.  Abdominal:     Palpations: Abdomen is soft.     Tenderness: There is no abdominal tenderness. There is no guarding or rebound.  Musculoskeletal:        General: No tenderness.     Comments: Pitting edema to BLE, right greater than left.   Skin:    General: Skin is warm and dry.  Neurological:     Mental Status: He is alert and oriented to person, place, and time.     Comments: Hard of hearing.  Global weakness  Psychiatric:        Behavior: Behavior normal.     ED Results / Procedures / Treatments   Labs (all labs ordered are listed, but only abnormal  results are displayed) Labs Reviewed  BASIC METABOLIC PANEL - Abnormal; Notable for the following components:      Result Value   CO2 19 (*)    BUN 25 (*)    Calcium 8.6 (*)    All other components within normal limits  CBC - Abnormal; Notable for the following components:   RBC 3.48 (*)  Hemoglobin 11.2 (*)    HCT 34.5 (*)    RDW 16.2 (*)    nRBC 0.6 (*)    All other components within normal limits  BRAIN NATRIURETIC PEPTIDE - Abnormal; Notable for the following components:   B Natriuretic Peptide 2,995.3 (*)    All other components within normal limits  HEPATIC FUNCTION PANEL - Abnormal; Notable for the following components:   Total Protein 6.4 (*)    AST 51 (*)    Total Bilirubin 1.5 (*)    Bilirubin, Direct 0.4 (*)    Indirect Bilirubin 1.1 (*)    All other components within normal limits  D-DIMER, QUANTITATIVE - Abnormal; Notable for the following components:   D-Dimer, Quant 0.96 (*)    All other components within normal limits  CBG MONITORING, ED - Abnormal; Notable for the following components:   Glucose-Capillary 139 (*)    All other components within normal limits  TROPONIN I (HIGH SENSITIVITY) - Abnormal; Notable for the following components:   Troponin I (High Sensitivity) 37 (*)    All other components within normal limits  TROPONIN I (HIGH SENSITIVITY) - Abnormal; Notable for the following components:   Troponin I (High Sensitivity) 39 (*)    All other components within normal limits  RESP PANEL BY RT-PCR (RSV, FLU A&B, COVID)  [id number]  URINALYSIS, ROUTINE W REFLEX MICROSCOPIC    EKG EKG Interpretation Date/Time:  Monday September 30 2023 16:20:30 EST Ventricular Rate:  49 PR Interval:    QRS Duration:  100 QT Interval:  514 QTC Calculation: 464 R Axis:   -6  Text Interpretation: Atrial fibrillation Anterior infarct, old Repol abnrm, severe global ischemia (LM/MVD) Confirmed by Tilden Fossa [phone]) on 10/01/2023 12:19:39 AM  Radiology CT Angio  Chest PE W/Cm &/Or Wo Cm Result Date: 10/01/2023 CLINICAL DATA:  Weakness for 1 week. Stopped taking meds. Positive D-dimer. PE suspected. EXAM: CT ANGIOGRAPHY CHEST WITH CONTRAST TECHNIQUE: Multidetector CT imaging of the chest was performed using the standard protocol during bolus administration of intravenous contrast. Multiplanar CT image reconstructions and MIPs were obtained to evaluate the vascular anatomy. RADIATION DOSE REDUCTION: This exam was performed according to the departmental dose-optimization program which includes automated exposure control, adjustment of the mA and/or kV according to patient size and/or use of iterative reconstruction technique. CONTRAST:  75mL OMNIPAQUE IOHEXOL 350 MG/ML SOLN COMPARISON:  Same day chest radiograph and CTA 02/18/2023 FINDINGS: Cardiovascular: Marked cardiomegaly. No pericardial effusion. No pulmonary embolism. Dilated main pulmonary artery measuring 39 mm. Dilated ascending aorta measuring 49 mm in diameter. This has increased from 02/18/2023 when it measured 47 mm using similar measuring technique. Evaluation for dissection is limited due to contrast timing. Reflux of contrast into the IVC and hepatic veins compatible with elevated right heart pressures. Advanced coronary artery and aortic atherosclerotic calcification. Mediastinum/Nodes: Trachea and esophagus are unremarkable. No thoracic adenopathy. Lungs/Pleura: Large right and small left pleural effusions. Patchy ground-glass opacities bilaterally. Compressive atelectasis in the lower lobes, right middle lobe, and lingula. No pneumothorax. Upper Abdomen: No acute abnormality. Musculoskeletal: No acute fracture. Review of the MIP images confirms the above findings. IMPRESSION: 1. No pulmonary embolism. 2. Large right and small left pleural effusions with compressive atelectasis. 3. Patchy ground-glass opacities bilaterally, likely edema. Atypical infection considered less likely though not excluded. 4.  Marked cardiomegaly with dilated main pulmonary artery and reflux of contrast into the IVC and hepatic veins compatible with elevated right heart pressures and pulmonary arterial hypertension. 5. Dilated ascending aorta  measuring 49 mm in diameter. This has increased from 02/18/2023 when it measured 47 mm using similar measuring technique. Ascending thoracic aortic aneurysm. Recommend semi-annual imaging followup by CTA or MRA and referral to cardiothoracic surgery if not already obtained. This recommendation follows 2010 ACCF/AHA/AATS/ACR/ASA/SCA/SCAI/SIR/STS/SVM Guidelines for the Diagnosis and Management of Patients With Thoracic Aortic Disease. Circulation. 2010; 121: W098-J191. Aortic aneurysm NOS (ICD10-I71.9) 6. Aortic Atherosclerosis (ICD10-I70.0). Electronically Signed   By: Minerva Fester M.D.   On: 10/01/2023 02:53   DG Chest Port 1 View Result Date: 10/01/2023 CLINICAL DATA:  Shortness of breath and weakness EXAM: PORTABLE CHEST 1 VIEW COMPARISON:  03/11/2023 radiographs FINDINGS: Cardiomegaly. Coronary stenting. Aortic atherosclerotic calcification. Layering bilateral pleural effusions and associated airspace opacities. No pneumothorax. No displaced rib fractures. IMPRESSION: Layering bilateral pleural effusions and associated airspace opacities. Cardiomegaly. Electronically Signed   By: Minerva Fester M.D.   On: 10/01/2023 01:25   CT Head Wo Contrast Result Date: 09/30/2023 CLINICAL DATA:  Neuro deficit, acute, stroke suspected.  Weakness. EXAM: CT HEAD WITHOUT CONTRAST TECHNIQUE: Contiguous axial images were obtained from the base of the skull through the vertex without intravenous contrast. RADIATION DOSE REDUCTION: This exam was performed according to the departmental dose-optimization program which includes automated exposure control, adjustment of the mA and/or kV according to patient size and/or use of iterative reconstruction technique. COMPARISON:  Head CT 03/11/2023 FINDINGS: Brain:  There is no evidence of an acute infarct, intracranial hemorrhage, mass, midline shift, or extra-axial fluid collection. There is moderate cerebral atrophy. Cerebral white matter hypodensities are unchanged and nonspecific but compatible with mild-to-moderate chronic small vessel ischemic disease. 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 Vascular: Calcified atherosclerosis at the skull base. No hyperdense vessel. Skull: No acute fracture or suspicious osseous lesion. Sinuses/Orbits: Visualized paranasal sinuses and mastoid air cells are clear. Bilateral cataract extraction. Other: None. IMPRESSION: 1. No evidence of acute intracranial abnormality. 2. Cerebral atrophy and chronic small vessel ischemic disease. Electronically Signed   By: Sebastian Ache M.D.   On: 09/30/2023 16:32    Procedures Procedures   CRITICAL CARE Performed by: Tilden Fossa   Total critical care time: 40 minutes  Critical care time was exclusive of separately billable procedures and treating other patients.  Critical care was necessary to treat or prevent imminent or life-threatening deterioration.  Critical care was time spent personally by me on the following activities: development of treatment plan with patient and/or surrogate as well as nursing, discussions with consultants, evaluation of patient's response to treatment, examination of patient, obtaining history from patient or surrogate, ordering and performing treatments and interventions, ordering and review of laboratory studies, ordering and review of radiographic studies, pulse oximetry and re-evaluation of patient's condition.  Medications Ordered in ED Medications  iohexol (OMNIPAQUE) 350 MG/ML injection 75 mL (75 mLs Intravenous Contrast Given 10/01/23 0147)  hydrALAZINE (APRESOLINE) injection 5 mg (5 mg Intravenous Given 10/01/23 0310)  furosemide (LASIX) injection 20 mg (20 mg Intravenous Given 10/01/23 0517)  amLODipine (NORVASC) tablet  2.5 mg (2.5 mg Oral Given 10/01/23 4782)    ED Course/ Medical Decision Making/ A&P                                 Medical Decision Making Amount and/or Complexity of Data Reviewed Labs: ordered. Radiology: ordered.  Risk Prescription drug management. Decision regarding hospitalization.   Patient with history of hypertension here for evaluation of generalized weakness,  shortness of breath.  He is edematous on examination, tachypneic.  Lungs are clear on evaluation.  He has global weakness on examination without focal neurologic deficits.  He is hypertensive, bradycardic at time of ED evaluation.  Bradycardia appears to be baseline for him.  Labs with mildly elevated but flat troponins.  He does have significant elevation in his BNP compared to priors.  D-dimer was elevated and a CTA was obtained.  CTA demonstrates bilateral large pleural effusions, pulmonary vascular congestion.  He was treated with furosemide for diuresis as well as hydralazine for accelerated hypertension.  Given his symptoms recommend admission for ongoing care.  Patient and daughter updated findings of studies and recommendation for admission and they are in agreement treatment plan.  Hospitalist consulted for admission for ongoing care.        Final Clinical Impression(s) / ED Diagnoses Final diagnoses:  Acute congestive heart failure, unspecified heart failure type [facility])  Hypertensive urgency    Rx / DC Orders ED Discharge Orders     None         Tilden Fossa, MD 10/01/23 [phone]

## 2023-10-01 NOTE — ED Notes (Signed)
Patient HR jumps around from 52 to 48. Provider notified. Patient daughter at bedside

## 2023-10-01 NOTE — ED Notes (Signed)
Patient resting in bed breathing eyes closed with family at bedside

## 2023-10-01 NOTE — ED Notes (Signed)
MD made aware of bradycardia.

## 2023-10-01 NOTE — Consult Note (Addendum)
Cardiology Consultation   Patient ID: [name] MRN: [id number]; DOB: 1928/03/31  Admit date: 09/30/2023 Date of Consult: 10/01/2023  PCP:  Stevphen Rochester, MD   [facility] Providers Cardiologist:  Charlton Haws, MD   {    Patient Profile:   [name] is a 89 y.o. male with a hx of CAD s/p DES to RCA 5/17, DES to RI '18, carotid artery disease s/p L CEA, HTN, HLD, CVA, paroxsymal Afib (not on long term OAC 2/2 falls), AV block on BB,  who is being seen 10/01/2023 for the evaluation of CHF at the request of Dr Sunnie Nielsen.  History of Present Illness:   [name] with above PMH presented to ER for weakness. Over the past week, he has been progressively weak to a point he could not longer ambulate independently. He reports having decreased oral intake, getting SOB when move around, and sleep a lot during the day due to fatigue. He is hard of hearing, barely slept last night, is very tired but pleasant. He denied ever having any chest pain. He states he feels much better now, not SOB at rest. His daughter and son are at bedside, states patient is normally very active, bowling weekly, drove to [place] few weeks ago with family and was doing well. He had acute change during the past week, more confused than usual, very weak with ambulation and moving around in the house, low appetite, and lot of sleeping. They did not notice any fever, chills,cough. He sometimes has incontinent episodes because he can't get to the bathroom on time. Since ER stay, he has gotten much better, urinated a lot more after Lasix, ate a good meal so far.   Admission diagnostic showed bicarb 19, Cr 1.05, BUN 25. TB 1.5. Hs trop 37 >39. D dimer 0.96.BNP 2995.   Hgb 11.2. Viral swab negative. CTH showed no acute findings. CTA chest no PE, Large right and small left pleural effusions with compressive atelectasis. Patchy ground-glass opacities bilaterally, likely edema. Atypical infection considered less likely though  not excluded.Marked cardiomegaly with dilated main pulmonary artery and reflux of contrast into the IVC and hepatic veins compatible with elevated right heart pressures and pulmonary arterial hypertension.Dilated ascending aorta measuring 49 mm in diameter. He is admitted to hospitalist for CHF and CAP. Given Lasix 40mg  daily and antibiotic. BP noted to be elevated up to 203/95. He was resumed on PTA lisinopril and imdur and add PRN hydralazine. Cardiology is consulted for CHF.    Per chart reviewed, he has CAD, had DES to RCA July (12/2015) which was complicated by radial AV fistula. He was hospitalized for unstable angina 02/2017, cath showed patent RCA stent, moderate distal RCA lesion, occluded circumflex which is nondominant, high-grade ostial, proximal and mid ramus branch stenosis as well as scattered noncritical LAD disease, old apical LAD occlusion. He was initially recommended medical therapy. Later he had persistent chest pain and EKG changes, upon return to the hospital, he was treated with complex PCI to ramus intermediate, three lesion were treated with high-speed rotational atherectomy, cutting balloon atherotomy, and and tandem DES stenting. He was recommend lifelong DAPT. Reportedly he had AV block on beta blocker in the past.   He was hospitalized 12/2020 for pneumonia and COVID, found in new onset slow A fib. Anticoagulation was not started due to advanced age and frequent falls.   He saw Eligha Bridegroom NP 06/13/22 for pre-op risk evaluation for hip surgery. He had RCRI 11%, <  4 METS functional capacity. Stress Myoview was recommended and completed on 06/18/22, showed low risk study, no ischemia, + prior myocardial infarction with 2 fixed moderate defects noted consistent with LAD and RCA infarction. Stress EF 66%. He was advied hold Plavix 5 days before surgery.    He was hospitalized in March 2024 after elective left THR, cardiology was consulted for bradycardia 30s in the PACU. EKG  showed A fib with slow VR. His BP was stable and had no symptoms. He was given phenylephrine which improved his HR. He had no recurrence of slow VR and was advised to avoid AVN blocking agents. He remained off anticoagulation due to high risk of falls and bleeding.     Past Medical History:  Diagnosis Date   Arthritis    AV BLOCK, 1ST DEGREE    Carotid artery disease (HCC)    a. s/p Left ECA followed by [name]   Coronary artery disease    Coronary artery disease involving coronary bypass graft of native heart with angina pectoris (HCC)    a. LHC 12/2015  3vd Left Cx 100% with colaterals, and distally LAD occluded, DES to prox-mid RCA  b. 02/2017: repeat cath with patent RCA stent and stable CAD; 02/27/2017: Rota-PCI to Ost RI   GERD (gastroesophageal reflux disease)    HLD (hyperlipidemia)    HTN (hypertension)    Hypothyroidism    Myocardial infarction (HCC)    20 years   NSVT (nonsustained ventricular tachycardia) (HCC)    during stress test 12/2015   Presence of drug coated stent in RCA & Ramus Intermedius. 02/28/2017   12/2015: PCI p-mRCA - Stent Resolute Integ 3.5x34 02/2017: Rota-PCI o-mRamus - overlapping DES STENT SYNERGY DES 3X32  & 3x24 (tapered post-dilation)   Stroke [facility])     Past Surgical History:  Procedure Laterality Date   CARDIAC CATHETERIZATION     x2   CARDIAC CATHETERIZATION N/A 01/11/2016   Procedure: Left Heart Cath and Coronary Angiography;  Surgeon: Iran Ouch, MD;  Location: [facility] INVASIVE CV LAB;  Service: Cardiovascular;  Laterality: N/A;   CAROTID ENDARTERECTOMY  11/28/2006   left   CATARACT EXTRACTION W/ INTRAOCULAR LENS IMPLANT Bilateral    CORONARY ATHERECTOMY N/A 02/27/2017   Procedure: Coronary Atherectomy;  Surgeon: Lennette Bihari, MD;  Location: [facility] INVASIVE CV LAB;  Service: Cardiovascular;  Laterality: N/A;   HERNIA REPAIR     LEFT HEART CATH AND CORONARY ANGIOGRAPHY N/A 02/21/2017   Procedure: Left Heart Cath and Coronary Angiography;   Surgeon: Runell Gess, MD;  Location[facility] INVASIVE CV LAB;  Service: Cardiovascular;  Laterality: N/A;   TONSILLECTOMY     TOTAL HIP ARTHROPLASTY Left 11/13/2022   Procedure: LEFT TOTAL HIP ARTHROPLASTY ANTERIOR APPROACH;  Surgeon: Kathryne Hitch, MD;  Location: [facility] OR;  Service: Orthopedics;  Laterality: Left;     Home Medications:  Prior to Admission medications   Medication Sig Start Date End Date Taking? Authorizing Provider  acetaminophen (TYLENOL) 500 MG tablet Take 1 tablet (500 mg total) by mouth every 6 (six) hours as needed for moderate pain (wrist). Patient taking differently: Take 500 mg by mouth daily. 02/28/17  Yes Laverda Page B, NP  allopurinol (ZYLOPRIM) 100 MG tablet Take 100 mg by mouth daily. 02/06/22  Yes [provider]  aspirin EC 81 MG EC tablet Take 1 tablet (81 mg total) by mouth daily. 01/12/16  Yes Laverda Page B, NP  b complex vitamins tablet Take 1 tablet by mouth daily.  Yes [provider]  Calcium Carbonate Antacid (TUMS PO) Take 1 tablet by mouth as needed (heartburn).   Yes [provider]  clopidogrel (PLAVIX) 75 MG tablet Take 1 tablet (75 mg total) by mouth daily. 04/10/23  Yes Wendall Stade, MD  isosorbide mononitrate (IMDUR) 60 MG 24 hr tablet TAKE ONE AND ONE-HALF TABLETS DAILY 11/06/22  Yes Wendall Stade, MD  levothyroxine (SYNTHROID) 25 MCG tablet Take 25 mcg by mouth daily. 03/23/22  Yes [provider]  Multiple Vitamin (MULTIVITAMIN) capsule Take 1 capsule by mouth daily.   Yes [provider]  nitroGLYCERIN (NITROSTAT) 0.4 MG SL tablet PLACE 1 TABLET UNDER THE TONGUE EVERY 5 MINUTES AS NEEDED FOR CHEST PAIN. NOT TO EXCEED 3 DOSES Patient taking differently: Place 0.4 mg under the tongue every 5 (five) minutes as needed for chest pain. 06/02/20  Yes Wendall Stade, MD  Omega-3 Fatty Acids (FISH OIL) 1000 MG CAPS Take 1,000 mg by mouth daily.   Yes [provider]  polyethylene  glycol (MIRALAX) 17 g packet Take 17 g by mouth daily. 02/18/23  Yes Small, Brooke L, PA  ranolazine (RANEXA) 1000 MG SR tablet TAKE 1 TABLET TWICE A DAY Patient taking differently: Take 1,000 mg by mouth once. 04/10/22  Yes Wendall Stade, MD  sertraline (ZOLOFT) 25 MG tablet Take 25 mg by mouth daily.   Yes [provider]  simvastatin (ZOCOR) 20 MG tablet TAKE 1 TABLET DAILY Patient taking differently: Take 20 mg by mouth daily. 11/21/22  Yes Wendall Stade, MD  zinc sulfate 220 (50 Zn) MG capsule Take 1 capsule (220 mg total) by mouth daily. 12/19/20  Yes Standley Brooking, MD  azithromycin (ZITHROMAX) 250 MG tablet Take 250 mg by mouth as needed (constipation). Patient not taking: Reported on 10/01/2023 09/03/23   [provider]  Calcium Carbonate (CALCIUM 500 PO) Take 500 mg by mouth daily. Patient not taking: Reported on 10/01/2023    [provider]  Cholecalciferol (VITAMIN D-3) 1000 units CAPS Take 1,000 Units by mouth daily. Patient not taking: Reported on 10/01/2023    [provider]  colchicine 0.6 MG tablet Take 0.6 mg by mouth daily as needed (Gout flare up). Patient not taking: Reported on 10/01/2023 07/09/23   [provider]  ferrous sulfate 325 (65 FE) MG tablet Take 325 mg by mouth 2 (two) times daily with a meal. Patient not taking: Reported on 10/01/2023    [provider]  finasteride (PROSCAR) 5 MG tablet Take 5 mg by mouth daily. Patient not taking: Reported on 10/01/2023 07/09/23 07/08/24  [provider]  folic acid (FOLVITE) 1 MG tablet Take 1 tablet (1 mg total) by mouth daily. Patient not taking: Reported on 10/01/2023 12/19/20   Standley Brooking, MD  Glucosamine 500 MG CAPS Take 500 mg by mouth daily. Patient not taking: Reported on 10/01/2023    [provider]  HYDROcodone-acetaminophen (NORCO/VICODIN) 5-325 MG tablet Take 1 tablet by mouth every 6 (six) hours as needed. Patient not taking: Reported  on 10/01/2023 02/18/23   Small, Brooke L, PA  lidocaine (HM LIDOCAINE PATCH) 4 % Place 1 patch onto the skin daily. Patient not taking: Reported on 10/01/2023 02/18/23   Small, Brooke L, PA  lisinopril (ZESTRIL) 20 MG tablet Take 1 tablet (20 mg total) by mouth daily. 06/13/22 06/14/23  Swinyer, Zachary George, NP  vitamin C (ASCORBIC ACID) 500 MG tablet Take 500 mg by mouth daily. Patient not  taking: Reported on 10/01/2023    [provider]    Inpatient Medications: Scheduled Meds:  allopurinol  100 mg Oral Daily   amLODipine  5 mg Oral Daily   aspirin EC  81 mg Oral Daily   clopidogrel  75 mg Oral Daily   enoxaparin (LOVENOX) injection  40 mg Subcutaneous Q24H   feeding supplement  237 mL Oral TID BM   furosemide  40 mg Intravenous Daily   irbesartan  75 mg Oral Daily   isosorbide mononitrate  60 mg Oral Daily   [START ON 10/02/2023] levothyroxine  25 mcg Oral QAC breakfast   pantoprazole  40 mg Oral Daily   sertraline  25 mg Oral Daily   simvastatin  20 mg Oral Daily   Continuous Infusions:  sodium chloride     sodium chloride     cefTRIAXone (ROCEPHIN)  IV Stopped (10/01/23 1227)   PRN Meds: sodium chloride, sodium chloride, acetaminophen **OR** acetaminophen, albuterol, hydrALAZINE, melatonin, polyethylene glycol  Allergies:    Allergies  Allergen Reactions   Beta Adrenergic Blockers Other (See Comments)    Hx of AV block. Should avoid    Social History:   Social History   Socioeconomic History   Marital status: Widowed    Spouse name: Not on file   Number of children: Not on file   Years of education: Not on file   Highest education level: Not on file  Occupational History   Not on file  Tobacco Use   Smoking status: Never   Smokeless tobacco: Never  Vaping Use   Vaping status: Never Used  Substance and Sexual Activity   Alcohol use: Yes    Alcohol/week: 7.0 standard drinks of alcohol    Types: 7 Cans of beer per week    Comment: 7 cans/day   Drug use:  No   Sexual activity: Not on file  Other Topics Concern   Not on file  Social History Narrative   Not on file   Social Drivers of Health   Financial Resource Strain: Low Risk  (09/03/2023)   Received from [facility]   Overall Financial Resource Strain (CARDIA)    Difficulty of Paying Living Expenses: Not very hard  Food Insecurity: No Food Insecurity (09/03/2023)   Received from [facility]   Hunger Vital Sign    Worried About Running Out of Food in the Last Year: Never true    Ran Out of Food in the Last Year: Never true  Transportation Needs: No Transportation Needs (09/03/2023)   Received from [facility] - Transportation    Lack of Transportation (Medical): No    Lack of Transportation (Non-Medical): No  Physical Activity: Insufficiently Active (09/03/2023)   Received from [facility]   Exercise Vital Sign    Days of Exercise per Week: 2 days    Minutes of Exercise per Session: 10 min  Stress: No Stress Concern Present (09/03/2023)   Received from [facility] - Occupational Stress Questionnaire    Feeling of Stress : Only a little  Social Connections: Moderately Integrated (09/03/2023)   Received from [facility]   Social Network    How would you rate your social network (family, work, friends)?: Adequate participation with social networks  Intimate Partner Violence: Unknown (02/14/2023)   Received from [facility]   HITS    Physically Hurt: Not on file    Insult or Talk Down To: Not  on file    Threaten Physical Harm: Not on file    Scream or Curse: Not on file    Family History:    Family History  Problem Relation Age of Onset   Diabetes Mother    Diabetes Sister    Cancer Sister 72       colon   Heart disease Sister      ROS:  Constitutional: see HPI  Eyes: Denied vision change or loss Ears/Nose/Mouth/Throat: Denied ear ache, sore throat, coughing, sinus pain Cardiovascular:  see HPI  Respiratory: see HPI  Gastrointestinal: Denied nausea, vomiting, abdominal pain, diarrhea Genital/Urinary: Denied dysuria, hematuria, urinary frequency/urgency Musculoskeletal: Denied muscle ache, joint pain Skin: Denied rash, wound Neuro: Denied headache, dizziness, syncope Psych: Denied history of depression/anxiety  Endocrine: Denied history of diabetes   Physical Exam/Data:   Vitals:   10/01/23 0715 10/01/23 0833 10/01/23 0930 10/01/23 1215  BP: (!) 192/102  (!) 193/83   Pulse: (!) 40  (!) 51   Resp: (!) 27  (!) 21   Temp:  (!) 97.5 F (36.4 C)  97.6 F (36.4 C)  TempSrc:  Oral  Oral  SpO2: 100%  100%   Weight:      Height:        Intake/Output Summary (Last 24 hours) at 10/01/2023 1232 Last data filed at 10/01/2023 1227 Gross per 24 hour  Intake 100 ml  Output 450 ml  Net -350 ml      09/30/2023    3:27 PM 05/13/2023    2:23 PM 03/11/2023    5:45 AM  Last 3 Weights  Weight (lbs) 146 lb 6.2 oz 146 lb 4.8 oz 160 lb 15 oz  Weight (kg) 66.4 kg 66.361 kg 73 kg     Body mass index is 23.63 kg/m.   Vitals:  Vitals:   10/01/23 0930 10/01/23 1215  BP: (!) 193/83   Pulse: (!) 51   Resp: (!) 21   Temp:  97.6 F (36.4 C)  SpO2: 100%    General Appearance: In no apparent distress, laying in bed, not toxic appearing  HEENT: Normocephalic, atraumatic.  Neck: Supple, trachea midline, no JVDs Cardiovascular: Irregularly irregular, S1S2, soft murmur  Respiratory: Resting breathing unlabored, lungs sounds clear but diminished at base to auscultation bilaterally, no use of accessory muscles. On room air.   Gastrointestinal: Bowel sounds positive, abdomen soft, non-tender Extremities: Able to move all extremities in bed without difficulty, BLE 1- edema  Musculoskeletal: generalized muscular atrophy  Skin: Intact, warm, dry.  Neurologic: Alert, oriented to person, place and time. HOH. Answer questions appropriately. Global weakness   Psychiatric: Normal affect.  Mood is appropriate.      EKG:  The EKG was personally reviewed and demonstrates:    EKG from 09/30/23 16:20 showed A fib with SVR 49bpm, anterior Q, old ST abnormalities of inferolateral leads unchanged, LVH  EKG from 10/01/23 at 07:19 showed A fib with SVR 41bpm,  anterior Q, old ST abnormalities of inferolateral leads unchanged, LVH  Telemetry:  Telemetry was personally reviewed and demonstrates:    Afib with SVR 50s   Relevant CV Studies:   Stress moview 06/18/22:   Findings are consistent with prior myocardial infarction. 2 fixed moderate defects noted consistent with LAD and RCA infarction. No ischemia. The study is low risk.   LV perfusion is abnormal. There is no evidence of ischemia. There is evidence of infarction. Defect 1: There is a small defect with moderate reduction in uptake present in  the mid to basal anteroseptal location(s) that is fixed. There is normal wall motion in the defect area. Consistent with infarction. Defect 2: There is a small defect with moderate reduction in uptake present in the apical to mid inferior location(s). There is normal wall motion in the defect area. Consistent with infarction.   Left ventricular function is normal. Nuclear stress EF: 66 %. The left ventricular ejection fraction is hyperdynamic (>65%). End diastolic cavity size is mildly enlarged.     Laboratory Data:  High Sensitivity Troponin:   Recent Labs  Lab 10/01/23 0044 10/01/23 0251  TROPONINIHS 37* 39*     Chemistry Recent Labs  Lab 09/30/23 1556  NA 137  K 3.8  CL 104  CO2 19*  GLUCOSE 96  BUN 25*  CREATININE 1.05  CALCIUM 8.6*  GFRNONAA >60  ANIONGAP 14    Recent Labs  Lab 10/01/23 0044  PROT 6.4*  ALBUMIN 3.7  AST 51*  ALT 29  ALKPHOS 90  BILITOT 1.5*   Lipids No results for input(s): "CHOL", "TRIG", "HDL", "LABVLDL", "LDLCALC", "CHOLHDL" in the last 168 hours.  Hematology Recent Labs  Lab 09/30/23 1556  WBC 7.3  RBC 3.48*  HGB 11.2*  HCT  34.5*  MCV 99.1  MCH 32.2  MCHC 32.5  RDW 16.2*  PLT 154   Thyroid No results for input(s): "TSH", "FREET4" in the last 168 hours.  BNP Recent Labs  Lab 10/01/23 0045  BNP 2,995.3*    DDimer  Recent Labs  Lab 10/01/23 0044  DDIMER 0.96*     Radiology/Studies:  CT Angio Chest PE W/Cm &/Or Wo Cm Result Date: 10/01/2023 CLINICAL DATA:  Weakness for 1 week. Stopped taking meds. Positive D-dimer. PE suspected. EXAM: CT ANGIOGRAPHY CHEST WITH CONTRAST TECHNIQUE: Multidetector CT imaging of the chest was performed using the standard protocol during bolus administration of intravenous contrast. Multiplanar CT image reconstructions and MIPs were obtained to evaluate the vascular anatomy. RADIATION DOSE REDUCTION: This exam was performed according to the departmental dose-optimization program which includes automated exposure control, adjustment of the mA and/or kV according to patient size and/or use of iterative reconstruction technique. CONTRAST:  75mL OMNIPAQUE IOHEXOL 350 MG/ML SOLN COMPARISON:  Same day chest radiograph and CTA 02/18/2023 FINDINGS: Cardiovascular: Marked cardiomegaly. No pericardial effusion. No pulmonary embolism. Dilated main pulmonary artery measuring 39 mm. Dilated ascending aorta measuring 49 mm in diameter. This has increased from 02/18/2023 when it measured 47 mm using similar measuring technique. Evaluation for dissection is limited due to contrast timing. Reflux of contrast into the IVC and hepatic veins compatible with elevated right heart pressures. Advanced coronary artery and aortic atherosclerotic calcification. Mediastinum/Nodes: Trachea and esophagus are unremarkable. No thoracic adenopathy. Lungs/Pleura: Large right and small left pleural effusions. Patchy ground-glass opacities bilaterally. Compressive atelectasis in the lower lobes, right middle lobe, and lingula. No pneumothorax. Upper Abdomen: No acute abnormality. Musculoskeletal: No acute fracture. Review  of the MIP images confirms the above findings. IMPRESSION: 1. No pulmonary embolism. 2. Large right and small left pleural effusions with compressive atelectasis. 3. Patchy ground-glass opacities bilaterally, likely edema. Atypical infection considered less likely though not excluded. 4. Marked cardiomegaly with dilated main pulmonary artery and reflux of contrast into the IVC and hepatic veins compatible with elevated right heart pressures and pulmonary arterial hypertension. 5. Dilated ascending aorta measuring 49 mm in diameter. This has increased from 02/18/2023 when it measured 47 mm using similar measuring technique. Ascending thoracic aortic aneurysm. Recommend semi-annual imaging followup by CTA  or MRA and referral to cardiothoracic surgery if not already obtained. This recommendation follows 2010 ACCF/AHA/AATS/ACR/ASA/SCA/SCAI/SIR/STS/SVM Guidelines for the Diagnosis and Management of Patients With Thoracic Aortic Disease. Circulation. 2010; 121: W098-J191. Aortic aneurysm NOS (ICD10-I71.9) 6. Aortic Atherosclerosis (ICD10-I70.0). Electronically Signed   By: Minerva Fester M.D.   On: 10/01/2023 02:53   DG Chest Port 1 View Result Date: 10/01/2023 CLINICAL DATA:  Shortness of breath and weakness EXAM: PORTABLE CHEST 1 VIEW COMPARISON:  03/11/2023 radiographs FINDINGS: Cardiomegaly. Coronary stenting. Aortic atherosclerotic calcification. Layering bilateral pleural effusions and associated airspace opacities. No pneumothorax. No displaced rib fractures. IMPRESSION: Layering bilateral pleural effusions and associated airspace opacities. Cardiomegaly. Electronically Signed   By: Minerva Fester M.D.   On: 10/01/2023 01:25   CT Head Wo Contrast Result Date: 09/30/2023 CLINICAL DATA:  Neuro deficit, acute, stroke suspected.  Weakness. EXAM: CT HEAD WITHOUT CONTRAST TECHNIQUE: Contiguous axial images were obtained from the base of the skull through the vertex without intravenous contrast. RADIATION DOSE  REDUCTION: This exam was performed according to the departmental dose-optimization program which includes automated exposure control, adjustment of the mA and/or kV according to patient size and/or use of iterative reconstruction technique. COMPARISON:  Head CT 03/11/2023 FINDINGS: Brain: There is no evidence of an acute infarct, intracranial hemorrhage, mass, midline shift, or extra-axial fluid collection. There is moderate cerebral atrophy. Cerebral white matter hypodensities are unchanged and nonspecific but compatible with mild-to-moderate chronic small vessel ischemic disease. 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 Vascular: Calcified atherosclerosis at the skull base. No hyperdense vessel. Skull: No acute fracture or suspicious osseous lesion. Sinuses/Orbits: Visualized paranasal sinuses and mastoid air cells are clear. Bilateral cataract extraction. Other: None. IMPRESSION: 1. No evidence of acute intracranial abnormality. 2. Cerebral atrophy and chronic small vessel ischemic disease. Electronically Signed   By: Sebastian Ache M.D.   On: 09/30/2023 16:32     Assessment and Plan:   Acute heart failure with preserved EF - presented with profound weakness, DOE, fatigue, increased lethargy and confusion for a week  - BNP 2995 - CTA chest with Large right and small left pleural effusions with compressive atelectasis.Patchy ground-glass opacities bilaterally, likely edema. Atypical infection considered less likely though not excluded.Marked cardiomegaly with dilated main pulmonary artery and reflux of contrast into the IVC and hepatic veins compatible with elevated right heart pressures and pulmonary arterial hypertension. - agree with bacterial pneumonia treatment as elderly has atypical presentation for infection  - start IV Lasix 40mg  daily, please track I&O, daily weight, he already improved clinically, likely transition to PO tomorrow  - will check Echo, no plan for invasive  cardiac workup given his age, will optimize medical management once Echo results  - limit salt intake  HTN urgency - query flash pulmonary edema given BP was sup to 200s systolic  - stop PTA lisinopril 10mg , start irbesartan 75mg  daily; continue PTA Imdur 60mg  daily; add amlodipine 5mg  daily - trend BP   CAD with hx of stent to RCA and RI  - no chest pain - HS trop flat 30s - no acute EKG changes - likely demand ischemia, no plan for further ischemic workup at this time - check Echo  - continue PTA DAPT ASA +Plavix, imdur, simvastatin; add irbesartan and amlodipine as above; no BB due slow AFib  Permanent A fib with slowed VR  - HR 50s at this time  - no historical anticoagulation due to frequent falls  Aortic aneurysm  - Dilated ascending aorta measuring  49 mm in diameter on CT - will need outpatient monitor   Pneumonia Hypothyroidism  Carotid artery disease Hx of CVA - per primary team      Risk Assessment/Risk Scores:   [place] [facility] Association (NYHA) Functional Class NYHA Class III  CHA2DS2-VASc Score = 7  This indicates a 11.2% annual risk of stroke. The patient's score is based upon: CHF History: 1 HTN History: 1 Diabetes History: 0 Stroke History: 2 Vascular Disease History: 1 Age Score: 2 Gender Score: 0   For questions or updates, please contact [facility] Please consult [email or web address] for contact info under   Signed, Cyndi Bender, NP  10/01/2023 12:32 PM  Patient seen and examined, note reviewed with the signed Advanced Practice Provider. I personally reviewed laboratory data, imaging studies and relevant notes. I independently examined the patient and formulated the important aspects of the plan. I have personally discussed the plan with the patient and/or family. Comments or changes to the note/plan are indicated below.  Patient seen and examined at his bedside, son at bedside  Acute heart failure with preserved EF Accelerated  hypertension  CAD s/p PCI  Permanent PAF  Aortic aneurysm Pneumonia   Clinical will benefit from a short course of IV lasix. He needs better blood pressure control. Agree with stopping the lisinopril, star Irbesartan 75 mg daily , continue Imdur 60 mg , add Amlodipine 5 mg daily.  Will reassess in the am No angina symptoms - unless if there is wall motion abnormalities on echo no plans for ischemic evaluation at this time.  Declined anticoagulation in the past for PAF.  Will need outpatient fu for CTA for aortic anuerysm     Thomasene Ripple DO, MS [facility] Attending Cardiologist [facility]  [address] [place], [place] [address] [phone] Website: [email or web address]

## 2023-10-01 NOTE — ED Notes (Signed)
Patient denies pain and is resting comfortably.

## 2023-10-01 NOTE — H&P (Addendum)
History and Physical    Patient: [name]:[id number] DOB: Mar 08, 1928 DOA: 09/30/2023 DOS: the patient was seen and examined on 10/01/2023 PCP: Stevphen Rochester, MD  Patient coming from: Home  Chief Complaint:  Chief Complaint  Patient presents with   Weakness   HPI: [name] is a 89 y.o. male with medical history significant of CAD s/p DES to RCA in 2017 and DES to ramus intermedius in 2018, paroxysmal atrial fibrillation not on anticoagulation due to falls, carotid artery disease s/p left CEA, CVA, hypertension, hyperlipidemia, hypothyroidism, and hard of hearing. [name] presented to [facility] ED with a one week history of progressive weakness now with inability to ambulate, decreased p.o. intake, dyspnea with ambulation when he was walking last week, and sleeping more than baseline. Endorses dyspnea and fatigue. Denies palpitations, dizziness, abdominal pain, nausea, or vomiting. Daughter at bedside endorses patient has had a nonproductive cough for approximately 1 week that is worse at night, no other symptoms of a respiratory viral illness. The only known sick contact is his daughter that he lives with who also had a cough about 2 weeks ago at which time she was tested for COVID/Flu and tested negative. Patient was recently treated outpatient for pneumonia with Azithromycin.  Daughter at bedside also reports noncompliance with outpatient medications, the only medication that she believes he has been taking regularly is Levothyroxine.  ED Course: On arrival to [facility] ED patient was noted to be afebrile Temp 36.3C, BP 214/116, HR 56, RR 18, SPO2 95% on RA. Labs notable for BUN 25, HGB 11.2, total protein 6.4, AST 51, Tbili 1.5, Troponin 37-->39, BNP 2999.5. COVID/Flu/RSV negative. UA negative. CXR obtained and shows bilateral pleural effusions with airspace opacities. CTA PE negative for PE. He was given 20 mg IV lasix, 5 mg IV hydralazine, and 2.5mg  amlodipine. [facility] contacted  for admission.  Review of Systems: As mentioned in the history of present illness. All other systems reviewed and are negative. Past Medical History:  Diagnosis Date   Arthritis    AV BLOCK, 1ST DEGREE    Carotid artery disease (HCC)    a. s/p Left ECA followed by [name]   Coronary artery disease    Coronary artery disease involving coronary bypass graft of native heart with angina pectoris (HCC)    a. LHC 12/2015  3vd Left Cx 100% with colaterals, and distally LAD occluded, DES to prox-mid RCA  b. 02/2017: repeat cath with patent RCA stent and stable CAD; 02/27/2017: Rota-PCI to Ost RI   GERD (gastroesophageal reflux disease)    HLD (hyperlipidemia)    HTN (hypertension)    Hypothyroidism    Myocardial infarction (HCC)    20 years   NSVT (nonsustained ventricular tachycardia) (HCC)    during stress test 12/2015   Presence of drug coated stent in RCA & Ramus Intermedius. 02/28/2017   12/2015: PCI p-mRCA - Stent Resolute Integ 3.5x34 02/2017: Rota-PCI o-mRamus - overlapping DES STENT SYNERGY DES 3X32  & 3x24 (tapered post-dilation)   Stroke [facility])    Past Surgical History:  Procedure Laterality Date   CARDIAC CATHETERIZATION     x2   CARDIAC CATHETERIZATION N/A 01/11/2016   Procedure: Left Heart Cath and Coronary Angiography;  Surgeon: Iran Ouch, MD;  Location: [facility] INVASIVE CV LAB;  Service: Cardiovascular;  Laterality: N/A;   CAROTID ENDARTERECTOMY  11/28/2006   left   CATARACT EXTRACTION W/ INTRAOCULAR LENS IMPLANT Bilateral    CORONARY ATHERECTOMY N/A 02/27/2017  Procedure: Coronary Atherectomy;  Surgeon: Lennette Bihari, MD;  Location: [facility] INVASIVE CV LAB;  Service: Cardiovascular;  Laterality: N/A;   HERNIA REPAIR     LEFT HEART CATH AND CORONARY ANGIOGRAPHY N/A 02/21/2017   Procedure: Left Heart Cath and Coronary Angiography;  Surgeon: Runell Gess, MD;  Location: [facility] INVASIVE CV LAB;  Service: Cardiovascular;  Laterality: N/A;   TONSILLECTOMY     TOTAL HIP  ARTHROPLASTY Left 11/13/2022   Procedure: LEFT TOTAL HIP ARTHROPLASTY ANTERIOR APPROACH;  Surgeon: Kathryne Hitch, MD;  Location: [facility] OR;  Service: Orthopedics;  Laterality: Left;   Social History:  reports that he has never smoked. He has never used smokeless tobacco. He reports current alcohol use of about 7.0 standard drinks of alcohol per week. He reports that he does not use drugs.  Allergies  Allergen Reactions   Beta Adrenergic Blockers Other (See Comments)    Hx of AV block. Should avoid    Family History  Problem Relation Age of Onset   Diabetes Mother    Diabetes Sister    Cancer Sister 41       colon   Heart disease Sister     Prior to Admission medications   Medication Sig Start Date End Date Taking? Authorizing Provider  azithromycin (ZITHROMAX) 250 MG tablet Take 250 mg by mouth as directed. 09/03/23  Yes [provider]  colchicine 0.6 MG tablet Take 0.6 mg by mouth daily as needed (Gout flare up). 07/09/23  Yes [provider]  finasteride (PROSCAR) 5 MG tablet Take 5 mg by mouth daily. 07/09/23 07/08/24 Yes [provider]  acetaminophen (TYLENOL) 500 MG tablet Take 1 tablet (500 mg total) by mouth every 6 (six) hours as needed for moderate pain (wrist). Patient taking differently: Take 500 mg by mouth daily. 02/28/17   Arty Baumgartner, NP  allopurinol (ZYLOPRIM) 100 MG tablet Take 100 mg by mouth daily. 02/06/22   [provider]  aspirin EC 81 MG EC tablet Take 1 tablet (81 mg total) by mouth daily. 01/12/16   Arty Baumgartner, NP  b complex vitamins tablet Take 1 tablet by mouth daily.    [provider]  Calcium Carbonate (CALCIUM 500 PO) Take 500 mg by mouth daily.    [provider]  Calcium Carbonate Antacid (TUMS PO) Take 1 tablet by mouth as needed (heartburn).    [provider]  Cholecalciferol (VITAMIN D-3) 1000 units CAPS Take 1,000 Units by mouth daily.    [provider]   clopidogrel (PLAVIX) 75 MG tablet Take 1 tablet (75 mg total) by mouth daily. 04/10/23   Wendall Stade, MD  ferrous sulfate 325 (65 FE) MG tablet Take 325 mg by mouth 2 (two) times daily with a meal.    [provider]  folic acid (FOLVITE) 1 MG tablet Take 1 tablet (1 mg total) by mouth daily. 12/19/20   Standley Brooking, MD  Glucosamine 500 MG CAPS Take 500 mg by mouth daily.    [provider]  HYDROcodone-acetaminophen (NORCO/VICODIN) 5-325 MG tablet Take 1 tablet by mouth every 6 (six) hours as needed. 02/18/23   Small, Brooke L, PA  isosorbide mononitrate (IMDUR) 60 MG 24 hr tablet TAKE ONE AND ONE-HALF TABLETS DAILY 11/06/22   Wendall Stade, MD  levothyroxine (SYNTHROID) 25 MCG tablet Take 25 mcg by mouth daily. 03/23/22   [provider]  lidocaine (HM LIDOCAINE PATCH) 4 % Place 1 patch onto the skin  daily. 02/18/23   Small, Brooke L, PA  lisinopril (ZESTRIL) 20 MG tablet Take 1 tablet (20 mg total) by mouth daily. 06/13/22 06/14/23  Swinyer, Zachary George, NP  Multiple Vitamin (MULTIVITAMIN) capsule Take 1 capsule by mouth daily.    [provider]  nitroGLYCERIN (NITROSTAT) 0.4 MG SL tablet PLACE 1 TABLET UNDER THE TONGUE EVERY 5 MINUTES AS NEEDED FOR CHEST PAIN. NOT TO EXCEED 3 DOSES Patient taking differently: Place 0.4 mg under the tongue every 5 (five) minutes as needed for chest pain. 06/02/20   Wendall Stade, MD  Omega-3 Fatty Acids (FISH OIL) 1000 MG CAPS Take 1,000 mg by mouth daily.    [provider]  polyethylene glycol (MIRALAX) 17 g packet Take 17 g by mouth daily. 02/18/23   Small, Brooke L, PA  ranolazine (RANEXA) 1000 MG SR tablet TAKE 1 TABLET TWICE A DAY Patient taking differently: Take 1,000 mg by mouth once. 04/10/22   Wendall Stade, MD  sertraline (ZOLOFT) 25 MG tablet Take 25 mg by mouth daily.    [provider]  simvastatin (ZOCOR) 20 MG tablet TAKE 1 TABLET DAILY 11/21/22   Wendall Stade, MD  vitamin C (ASCORBIC  ACID) 500 MG tablet Take 500 mg by mouth daily.    [provider]  zinc sulfate 220 (50 Zn) MG capsule Take 1 capsule (220 mg total) by mouth daily. 12/19/20   Standley Brooking, MD    Physical Exam: Vitals:   10/01/23 0643 10/01/23 0645 10/01/23 0700 10/01/23 0715  BP: (!) 201/92 (!) 201/92 (!) 163/106 (!) 192/102  Pulse:   (!) 59 (!) 40  Resp:  (!) 23 (!) 33 (!) 27  Temp:      TempSrc:      SpO2:   99% 100%  Weight:      Height:       Constitutional: NAD, calm, comfortable. Generalized weakness Eyes: PERRL, lids and conjunctivae normal ENMT: Mucous membranes are moist. Posterior pharynx clear of any exudate or lesions. Hard of hearing  Neck: no masses, no thyromegaly Respiratory: clear to auscultation bilaterally. Normal respiratory effort. No accessory muscle use.  Cardiovascular: Decreased rate and irregular rhythm, no murmurs / rubs / gallops. +1 bilateral lower extremity edema. 2+ radial and pedal pulses.   Abdomen: no tenderness, no masses palpated. No hepatosplenomegaly. Bowel sounds positive x4 quadrants.  Musculoskeletal: no clubbing / cyanosis. No joint deformity upper and lower extremities. Good ROM, no contractures. Normal muscle tone.  Skin: Dry skin. No rashes, lesions, ulcers.  Neurologic: CN 2-12 grossly intact. Sensation intact.  Alert and oriented x 3. Normal mood.   Data Reviewed: CBC    Component Value Date/Time   WBC 7.3 09/30/2023 1556   RBC 3.48 (L) 09/30/2023 1556   HGB 11.2 (L) 09/30/2023 1556   HCT 34.5 (L) 09/30/2023 1556   PLT 154 09/30/2023 1556   MCV 99.1 09/30/2023 1556   MCH 32.2 09/30/2023 1556   MCHC 32.5 09/30/2023 1556   RDW 16.2 (H) 09/30/2023 1556   LYMPHSABS 1.1 12/18/2020 0416   MONOABS 0.2 12/18/2020 0416   EOSABS 0.0 12/18/2020 0416   BASOSABS 0.0 12/18/2020 0416   CMP     Component Value Date/Time   NA 137 09/30/2023 1556   K 3.8 09/30/2023 1556   CL 104 09/30/2023 1556   CO2 19 (L) 09/30/2023 1556   GLUCOSE 96  09/30/2023 1556   BUN 25 (H) 09/30/2023 1556   CREATININE 1.05 09/30/2023 1556  CALCIUM 8.6 (L) 09/30/2023 1556   PROT 6.4 (L) 10/01/2023 0044   ALBUMIN 3.7 10/01/2023 0044   AST 51 (H) 10/01/2023 0044   ALT 29 10/01/2023 0044   ALKPHOS 90 10/01/2023 0044   BILITOT 1.5 (H) 10/01/2023 0044   GFRNONAA >60 09/30/2023 1556   Cardiac Panel (last 3 results) Recent Labs    10/01/23 0044 10/01/23 0251  TROPONINIHS 37* 39*   D-dimer    Component Value Date/Time   DDIMER 0.96 10/01/2023 0044   BNP    Component Value Date/Time   BNP 2,995.3 (H) 10/01/2023 0045   Results for orders placed or performed during the hospital encounter of 09/30/23  Resp panel by RT-PCR (RSV, Flu A&B, Covid) Anterior Nasal Swab     Status: None   Collection Time: 09/30/23  3:56 PM   Specimen: Anterior Nasal Swab  Result Value Ref Range Status   SARS Coronavirus 2 by RT PCR NEGATIVE NEGATIVE Final    Comment: (NOTE) SARS-CoV-2 target nucleic acids are NOT DETECTED.  The SARS-CoV-2 RNA is generally detectable in upper respiratory specimens during the acute phase of infection. The lowest concentration of SARS-CoV-2 viral copies this assay can detect is 138 copies/mL. A negative result does not preclude SARS-Cov-2 infection and should not be used as the sole basis for treatment or other patient management decisions. A negative result may occur with  improper specimen collection/handling, submission of specimen other than nasopharyngeal swab, presence of viral mutation(s) within the areas targeted by this assay, and inadequate number of viral copies(<138 copies/mL). A negative result must be combined with clinical observations, patient history, and epidemiological information. The expected result is Negative.  Fact Sheet for Patients:  BloggerCourse.com  Fact Sheet for Healthcare Providers:  SeriousBroker.it  This test is no t yet approved or cleared  by the [place] FDA and  has been authorized for detection and/or diagnosis of SARS-CoV-2 by FDA under an Emergency Use Authorization (EUA). This EUA will remain  in effect (meaning this test can be used) for the duration of the COVID-19 declaration under Section 564(b)(1) of the Act, 21 U.S.C.section 360bbb-3(b)(1), unless the authorization is terminated  or revoked sooner.       Influenza A by PCR NEGATIVE NEGATIVE Final   Influenza B by PCR NEGATIVE NEGATIVE Final    Comment: (NOTE) The Xpert Xpress SARS-CoV-2/FLU/RSV plus assay is intended as an aid in the diagnosis of influenza from Nasopharyngeal swab specimens and should not be used as a sole basis for treatment. Nasal washings and aspirates are unacceptable for Xpert Xpress SARS-CoV-2/FLU/RSV testing.  Fact Sheet for Patients: BloggerCourse.com  Fact Sheet for Healthcare Providers: SeriousBroker.it  This test is not yet approved or cleared by the [place] FDA and has been authorized for detection and/or diagnosis of SARS-CoV-2 by FDA under an Emergency Use Authorization (EUA). This EUA will remain in effect (meaning this test can be used) for the duration of the COVID-19 declaration under Section 564(b)(1) of the Act, 21 U.S.C. section 360bbb-3(b)(1), unless the authorization is terminated or revoked.     Resp Syncytial Virus by PCR NEGATIVE NEGATIVE Final    Comment: (NOTE) Fact Sheet for Patients: BloggerCourse.com  Fact Sheet for Healthcare Providers: SeriousBroker.it  This test is not yet approved or cleared by the [place] FDA and has been authorized for detection and/or diagnosis of SARS-CoV-2 by FDA under an Emergency Use Authorization (EUA). This EUA will remain in effect (meaning this test can be used) for the duration of  the COVID-19 declaration under Section 564(b)(1) of the Act, 21  U.S.C. section 360bbb-3(b)(1), unless the authorization is terminated or revoked.  Performed at [facility], [address], [place], [place] [address]    CT Angio Chest PE W/Cm &/Or Wo Cm Result Date: 10/01/2023 CLINICAL DATA:  Weakness for 1 week. Stopped taking meds. Positive D-dimer. PE suspected. EXAM: CT ANGIOGRAPHY CHEST WITH CONTRAST TECHNIQUE: Multidetector CT imaging of the chest was performed using the standard protocol during bolus administration of intravenous contrast. Multiplanar CT image reconstructions and MIPs were obtained to evaluate the vascular anatomy. RADIATION DOSE REDUCTION: This exam was performed according to the departmental dose-optimization program which includes automated exposure control, adjustment of the mA and/or kV according to patient size and/or use of iterative reconstruction technique. CONTRAST:  75mL OMNIPAQUE IOHEXOL 350 MG/ML SOLN COMPARISON:  Same day chest radiograph and CTA 02/18/2023 FINDINGS: Cardiovascular: Marked cardiomegaly. No pericardial effusion. No pulmonary embolism. Dilated main pulmonary artery measuring 39 mm. Dilated ascending aorta measuring 49 mm in diameter. This has increased from 02/18/2023 when it measured 47 mm using similar measuring technique. Evaluation for dissection is limited due to contrast timing. Reflux of contrast into the IVC and hepatic veins compatible with elevated right heart pressures. Advanced coronary artery and aortic atherosclerotic calcification. Mediastinum/Nodes: Trachea and esophagus are unremarkable. No thoracic adenopathy. Lungs/Pleura: Large right and small left pleural effusions. Patchy ground-glass opacities bilaterally. Compressive atelectasis in the lower lobes, right middle lobe, and lingula. No pneumothorax. Upper Abdomen: No acute abnormality. Musculoskeletal: No acute fracture. Review of the MIP images confirms the above findings. IMPRESSION: 1. No pulmonary embolism. 2. Large right  and small left pleural effusions with compressive atelectasis. 3. Patchy ground-glass opacities bilaterally, likely edema. Atypical infection considered less likely though not excluded. 4. Marked cardiomegaly with dilated main pulmonary artery and reflux of contrast into the IVC and hepatic veins compatible with elevated right heart pressures and pulmonary arterial hypertension. 5. Dilated ascending aorta measuring 49 mm in diameter. This has increased from 02/18/2023 when it measured 47 mm using similar measuring technique. Ascending thoracic aortic aneurysm. Recommend semi-annual imaging followup by CTA or MRA and referral to cardiothoracic surgery if not already obtained. This recommendation follows 2010 ACCF/AHA/AATS/ACR/ASA/SCA/SCAI/SIR/STS/SVM Guidelines for the Diagnosis and Management of Patients With Thoracic Aortic Disease. Circulation. 2010; 121: U045-W098. Aortic aneurysm NOS (ICD10-I71.9) 6. Aortic Atherosclerosis (ICD10-I70.0). Electronically Signed   By: Minerva Fester M.D.   On: 10/01/2023 02:53   DG Chest Port 1 View Result Date: 10/01/2023 CLINICAL DATA:  Shortness of breath and weakness EXAM: PORTABLE CHEST 1 VIEW COMPARISON:  03/11/2023 radiographs FINDINGS: Cardiomegaly. Coronary stenting. Aortic atherosclerotic calcification. Layering bilateral pleural effusions and associated airspace opacities. No pneumothorax. No displaced rib fractures. IMPRESSION: Layering bilateral pleural effusions and associated airspace opacities. Cardiomegaly. Electronically Signed   By: Minerva Fester M.D.   On: 10/01/2023 01:25   CT Head Wo Contrast Result Date: 09/30/2023 CLINICAL DATA:  Neuro deficit, acute, stroke suspected.  Weakness. EXAM: CT HEAD WITHOUT CONTRAST TECHNIQUE: Contiguous axial images were obtained from the base of the skull through the vertex without intravenous contrast. RADIATION DOSE REDUCTION: This exam was performed according to the departmental dose-optimization program which  includes automated exposure control, adjustment of the mA and/or kV according to patient size and/or use of iterative reconstruction technique. COMPARISON:  Head CT 03/11/2023 FINDINGS: Brain: There is no evidence of an acute infarct, intracranial hemorrhage, mass, midline shift, or extra-axial fluid collection. There is moderate cerebral atrophy. Cerebral  white matter hypodensities are unchanged and nonspecific but compatible with mild-to-moderate chronic small vessel ischemic disease. 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 Vascular: Calcified atherosclerosis at the skull base. No hyperdense vessel. Skull: No acute fracture or suspicious osseous lesion. Sinuses/Orbits: Visualized paranasal sinuses and mastoid air cells are clear. Bilateral cataract extraction. Other: None. IMPRESSION: 1. No evidence of acute intracranial abnormality. 2. Cerebral atrophy and chronic small vessel ischemic disease. Electronically Signed   By: Sebastian Ache M.D.   On: 09/30/2023 16:32      Assessment and Plan: #New Onset Congestive Heart Failure BNP 2995.3. LVEF 60-65% with Grade I diastolic dysfunction in May 2017.  - IV Lasix 40 mg x1, titrate dose based on response - ECHO - Strict I/Os - Check Lipid panel, HGB A1C, TSH - Cardiology consulted, appreciate their recommendations and management  #Community Acquired Pneumonia - IV Ceftriaxone - Check PCT - PRN nebulizers - Incentive Spirometer  #Hypertensive Urgency #Hx of Hypertension - Continue home lisinopril and Imdur - PRN IV hydralazine  #Coronary Artery Disease #Hyperlipidemia - Continue home Aspirin and plavix - Continue home Simvastatin  #Atrial Fibrillation with slow ventricular response - HR normally 50s-60s - Not on anticoagulation secondary to falls - Replete K and Mg for goal K>4 and Mg>2  #Hypothyroid - Continue home Levothyroxine - Check TSH  VTE prophylaxis: Lovenox  GI prophylaxis: Protonix Diet: Heart  Healthy Access: PIV Lines: NONE Code Status: FULL Telemetry: Yes Disposition: Admit to Stepdown  Advance Care Planning:   Code Status: Full Code   Consults: Cardiology  Family Communication: Daughter and son at bedside  Severity of Illness: The appropriate patient status for this patient is INPATIENT. Inpatient status is judged to be reasonable and necessary in order to provide the required intensity of service to ensure the patient's safety. The patient's presenting symptoms, physical exam findings, and initial radiographic and laboratory data in the context of their chronic comorbidities is felt to place them at high risk for further clinical deterioration. Furthermore, it is not anticipated that the patient will be medically stable for discharge from the hospital within 2 midnights of admission.   * I certify that at the point of admission it is my clinical judgment that the patient will require inpatient hospital care spanning beyond 2 midnights from the point of admission due to high intensity of service, high risk for further deterioration and high frequency of surveillance required.*  To reach the provider On-Call:   7AM- 7PM see care teams to locate the attending and reach out to them via [email or web address]. Password: [email or web address] 7PM-7AM contact night-coverage If you still have difficulty reaching the appropriate provider, please page the [facility] (Director on Call) for [facility] on amion for assistance  This document was prepared using Conservation officer, historic buildings and may include unintentional dictation errors.  Bishop Limbo FNP-BC, PMHNP-BC Nurse Practitioner [facility]

## 2023-10-01 NOTE — ED Notes (Signed)
Patient pulled up in bed and HOB adjusted for comfort. Patient lights dimmed in room at this time.

## 2023-10-01 NOTE — ED Notes (Signed)
Provider in room speaking with patient at this time

## 2023-10-01 NOTE — ED Notes (Signed)
ED TO INPATIENT HANDOFF REPORT  Name/Age/Gender [name] 89 y.o. male  Code Status    Code Status Orders  (From admission, onward)           Start     Ordered   10/01/23 0824  Full code  Continuous       Question:  By:  Answer:  Consent: discussion documented in EHR   10/01/23 0825           Code Status History     Date Active Date Inactive Code Status Order ID Comments User Context   11/13/2022 1829 11/17/2022 2011 Full Code [id number]  Kathryne Hitch, MD Inpatient   12/17/2020 0623 12/18/2020 1835 Full Code [id number]  Eduard Clos, MD ED   02/21/2017 2348 02/28/2017 1430 Full Code [id number]  Levonne Spiller, DO ED   02/20/2017 1328 02/21/2017 1740 Full Code [id number]  Pricilla Riffle, MD Inpatient   01/11/2016 1830 01/12/2016 1738 Full Code [id number]  Janetta Hora, PA-C Inpatient       Home/SNF/Other Home  Chief Complaint Acute exacerbation of CHF (congestive heart failure) (HCC) [I50.9]  Level of Care/Admitting Diagnosis ED Disposition     ED Disposition  Admit   Condition  --   Comment  Hospital Area: [facility] [[id number]]  Level of Care: Stepdown [14]  Admit to SDU based on following criteria: Cardiac Instability:  Patients experiencing chest pain, unconfirmed MI and stable, arrhythmias and CHF requiring medical management and potentially compromising patient's stability  May admit patient to [facility] or [facility] if equivalent level of care is available:: No  Covid Evaluation: Asymptomatic - no recent exposure (last 10 days) testing not required  Diagnosis: Acute exacerbation of CHF (congestive heart failure) [facility]) [[id number]]  Admitting Physician: Alba Cory [phone]  Attending Physician: Alba Cory [phone]  Certification:: I certify this patient will need inpatient services for at least 2 midnights  Expected Medical Readiness: 10/04/2023          Medical History Past Medical History:  Diagnosis  Date   Arthritis    AV BLOCK, 1ST DEGREE    Carotid artery disease (HCC)    a. s/p Left ECA followed by [name]   Coronary artery disease    Coronary artery disease involving coronary bypass graft of native heart with angina pectoris (HCC)    a. LHC 12/2015  3vd Left Cx 100% with colaterals, and distally LAD occluded, DES to prox-mid RCA  b. 02/2017: repeat cath with patent RCA stent and stable CAD; 02/27/2017: Rota-PCI to Ost RI   GERD (gastroesophageal reflux disease)    HLD (hyperlipidemia)    HTN (hypertension)    Hypothyroidism    Myocardial infarction (HCC)    20 years   NSVT (nonsustained ventricular tachycardia) (HCC)    during stress test 12/2015   Presence of drug coated stent in RCA & Ramus Intermedius. 02/28/2017   12/2015: PCI p-mRCA - Stent Resolute Integ 3.5x34 02/2017: Rota-PCI o-mRamus - overlapping DES STENT SYNERGY DES 3X32  & 3x24 (tapered post-dilation)   Stroke (HCC)     Allergies Allergies  Allergen Reactions   Beta Adrenergic Blockers Other (See Comments)    Hx of AV block. Should avoid    IV Location/Drains/Wounds Patient Lines/Drains/Airways Status     Active Line/Drains/Airways     Name Placement date Placement time Site Days   Peripheral IV 10/01/23 20 G 1" Left Forearm 10/01/23  0035  Forearm  less  than 1   Fistula / Graft Right Forearm Arteriovenous fistula 02/22/17  0122  Forearm  2412            Labs/Imaging Results for orders placed or performed during the hospital encounter of 09/30/23 (from the past 48 hours)  Urinalysis, Routine w reflex microscopic -Urine, Clean Catch     Status: Abnormal   Collection Time: 09/30/23  8:28 AM  Result Value Ref Range   Color, Urine STRAW (A) YELLOW   APPearance CLEAR CLEAR   Specific Gravity, Urine 1.010 1.005 - 1.030   pH 6.0 5.0 - 8.0   Glucose, UA NEGATIVE NEGATIVE mg/dL   Hgb urine dipstick NEGATIVE NEGATIVE   Bilirubin Urine NEGATIVE NEGATIVE   Ketones, ur NEGATIVE NEGATIVE mg/dL   Protein,  ur NEGATIVE NEGATIVE mg/dL   Nitrite NEGATIVE NEGATIVE   Leukocytes,Ua NEGATIVE NEGATIVE    Comment: Performed at [facility], [address], [place], [place] [address]  Basic metabolic panel     Status: Abnormal   Collection Time: 09/30/23  3:56 PM  Result Value Ref Range   Sodium 137 135 - 145 mmol/L   Potassium 3.8 3.5 - 5.1 mmol/L   Chloride 104 98 - 111 mmol/L   CO2 19 (L) 22 - 32 mmol/L   Glucose, Bld 96 70 - 99 mg/dL    Comment: Glucose reference range applies only to samples taken after fasting for at least 8 hours.   BUN 25 (H) 8 - 23 mg/dL   Creatinine, Ser 6.04 0.61 - 1.24 mg/dL   Calcium 8.6 (L) 8.9 - 10.3 mg/dL   GFR, Estimated >54 >09 mL/min    Comment: (NOTE) Calculated using the CKD-EPI Creatinine Equation (2021)    Anion gap 14 5 - 15    Comment: Performed at [facility], [address], [place], [place] [address]  CBC     Status: Abnormal   Collection Time: 09/30/23  3:56 PM  Result Value Ref Range   WBC 7.3 4.0 - 10.5 K/uL   RBC 3.48 (L) 4.22 - 5.81 MIL/uL   Hemoglobin 11.2 (L) 13.0 - 17.0 g/dL   HCT 47.8 (L) 29.5 - 62.1 %   MCV 99.1 80.0 - 100.0 fL   MCH 32.2 26.0 - 34.0 pg   MCHC 32.5 30.0 - 36.0 g/dL   RDW 30.8 (H) 65.7 - 84.6 %   Platelets 154 150 - 400 K/uL   nRBC 0.6 (H) 0.0 - 0.2 %    Comment: Performed at [facility], [address] [place], [place] [address]  Resp panel by RT-PCR (RSV, Flu A&B, Covid) Anterior Nasal Swab     Status: None   Collection Time: 09/30/23  3:56 PM   Specimen: Anterior Nasal Swab  Result Value Ref Range   SARS Coronavirus 2 by RT PCR NEGATIVE NEGATIVE    Comment: (NOTE) SARS-CoV-2 target nucleic acids are NOT DETECTED.  The SARS-CoV-2 RNA is generally detectable in upper respiratory specimens during the acute phase of infection. The lowest concentration of SARS-CoV-2 viral copies this assay can detect is 138 copies/mL. A negative result does not preclude  SARS-Cov-2 infection and should not be used as the sole basis for treatment or other patient management decisions. A negative result may occur with  improper specimen collection/handling, submission of specimen other than nasopharyngeal swab, presence of viral mutation(s) within the areas targeted by this assay, and inadequate number of viral copies(<138 copies/mL). A negative result must be combined  with clinical observations, patient history, and epidemiological information. The expected result is Negative.  Fact Sheet for Patients:  BloggerCourse.com  Fact Sheet for Healthcare Providers:  SeriousBroker.it  This test is no t yet approved or cleared by the [place] FDA and  has been authorized for detection and/or diagnosis of SARS-CoV-2 by FDA under an Emergency Use Authorization (EUA). This EUA will remain  in effect (meaning this test can be used) for the duration of the COVID-19 declaration under Section 564(b)(1) of the Act, 21 U.S.C.section 360bbb-3(b)(1), unless the authorization is terminated  or revoked sooner.       Influenza A by PCR NEGATIVE NEGATIVE   Influenza B by PCR NEGATIVE NEGATIVE    Comment: (NOTE) The Xpert Xpress SARS-CoV-2/FLU/RSV plus assay is intended as an aid in the diagnosis of influenza from Nasopharyngeal swab specimens and should not be used as a sole basis for treatment. Nasal washings and aspirates are unacceptable for Xpert Xpress SARS-CoV-2/FLU/RSV testing.  Fact Sheet for Patients: BloggerCourse.com  Fact Sheet for Healthcare Providers: SeriousBroker.it  This test is not yet approved or cleared by the [place] FDA and has been authorized for detection and/or diagnosis of SARS-CoV-2 by FDA under an Emergency Use Authorization (EUA). This EUA will remain in effect (meaning this test can be used) for the duration of the COVID-19  declaration under Section 564(b)(1) of the Act, 21 U.S.C. section 360bbb-3(b)(1), unless the authorization is terminated or revoked.     Resp Syncytial Virus by PCR NEGATIVE NEGATIVE    Comment: (NOTE) Fact Sheet for Patients: BloggerCourse.com  Fact Sheet for Healthcare Providers: SeriousBroker.it  This test is not yet approved or cleared by the [place] FDA and has been authorized for detection and/or diagnosis of SARS-CoV-2 by FDA under an Emergency Use Authorization (EUA). This EUA will remain in effect (meaning this test can be used) for the duration of the COVID-19 declaration under Section 564(b)(1) of the Act, 21 U.S.C. section 360bbb-3(b)(1), unless the authorization is terminated or revoked.  Performed at [facility], [address] [place], [place] [address]   CBG monitoring, ED     Status: Abnormal   Collection Time: 09/30/23 11:56 PM  Result Value Ref Range   Glucose-Capillary 139 (H) 70 - 99 mg/dL    Comment: Glucose reference range applies only to samples taken after fasting for at least 8 hours.  Hepatic function panel     Status: Abnormal   Collection Time: 10/01/23 12:44 AM  Result Value Ref Range   Total Protein 6.4 (L) 6.5 - 8.1 g/dL   Albumin 3.7 3.5 - 5.0 g/dL   AST 51 (H) 15 - 41 U/L   ALT 29 0 - 44 U/L   Alkaline Phosphatase 90 38 - 126 U/L   Total Bilirubin 1.5 (H) 0.0 - 1.2 mg/dL   Bilirubin, Direct 0.4 (H) 0.0 - 0.2 mg/dL   Indirect Bilirubin 1.1 (H) 0.3 - 0.9 mg/dL    Comment: Performed at [facility], [address], [place], [place] [address]  Troponin I (High Sensitivity)     Status: Abnormal   Collection Time: 10/01/23 12:44 AM  Result Value Ref Range   Troponin I (High Sensitivity) 37 (H) <18 ng/L    Comment: (NOTE) Elevated high sensitivity troponin I (hsTnI) values and significant  changes across serial measurements may suggest ACS but many  other  chronic and acute conditions are known to elevate hsTnI results.  Refer to the "Links" section for  chest pain algorithms and additional  guidance. Performed at [facility], [address] [place], [place] [address]   D-dimer, quantitative     Status: Abnormal   Collection Time: 10/01/23 12:44 AM  Result Value Ref Range   D-Dimer, Quant 0.96 (H) 0.00 - 0.50 ug/mL-FEU    Comment: (NOTE) At the manufacturer cut-off value of 0.5 g/mL FEU, this assay has a negative predictive value of 95-100%.This assay is intended for use in conjunction with a clinical pretest probability (PTP) assessment model to exclude pulmonary embolism (PE) and deep venous thrombosis (DVT) in outpatients suspected of PE or DVT. Results should be correlated with clinical presentation. Performed at [facility], [address], [place], [place] [address]   Brain natriuretic peptide     Status: Abnormal   Collection Time: 10/01/23 12:45 AM  Result Value Ref Range   B Natriuretic Peptide 2,995.3 (H) 0.0 - 100.0 pg/mL    Comment: Performed at [facility], [address], [place], [place] [address]  Troponin I (High Sensitivity)     Status: Abnormal   Collection Time: 10/01/23  2:51 AM  Result Value Ref Range   Troponin I (High Sensitivity) 39 (H) <18 ng/L    Comment: (NOTE) Elevated high sensitivity troponin I (hsTnI) values and significant  changes across serial measurements may suggest ACS but many other  chronic and acute conditions are known to elevate hsTnI results.  Refer to the "Links" section for chest pain algorithms and additional  guidance. Performed at [facility], [address], [place], [place] [address]   CBC     Status: Abnormal   Collection Time: 10/01/23 12:35 PM  Result Value Ref Range   WBC 8.2 4.0 - 10.5 K/uL   RBC 3.21 (L) 4.22 - 5.81 MIL/uL   Hemoglobin 10.4 (L) 13.0 - 17.0 g/dL   HCT 25.3 (L) 66.4  - 52.0 %   MCV 99.1 80.0 - 100.0 fL   MCH 32.4 26.0 - 34.0 pg   MCHC 32.7 30.0 - 36.0 g/dL   RDW 40.3 (H) 47.4 - 25.9 %   Platelets 150 150 - 400 K/uL   nRBC 0.2 0.0 - 0.2 %    Comment: Performed at [facility], [address], [place], [place] [address]  Procalcitonin     Status: None   Collection Time: 10/01/23 12:35 PM  Result Value Ref Range   Procalcitonin <0.10 ng/mL    Comment:        Interpretation: PCT (Procalcitonin) <= 0.5 ng/mL: Systemic infection (sepsis) is not likely. Local bacterial infection is possible. (NOTE)       Sepsis PCT Algorithm           Lower Respiratory Tract                                      Infection PCT Algorithm    ----------------------------     ----------------------------         PCT < 0.25 ng/mL                PCT < 0.10 ng/mL          Strongly encourage             Strongly discourage   discontinuation of antibiotics    initiation of antibiotics    ----------------------------     -----------------------------  PCT 0.25 - 0.50 ng/mL            PCT 0.10 - 0.25 ng/mL               OR       >80% decrease in PCT            Discourage initiation of                                            antibiotics      Encourage discontinuation           of antibiotics    ----------------------------     -----------------------------         PCT >= 0.50 ng/mL              PCT 0.26 - 0.50 ng/mL               AND        <80% decrease in PCT             Encourage initiation of                                             antibiotics       Encourage continuation           of antibiotics    ----------------------------     -----------------------------        PCT >= 0.50 ng/mL                  PCT > 0.50 ng/mL               AND         increase in PCT                  Strongly encourage                                      initiation of antibiotics    Strongly encourage escalation           of antibiotics                                      -----------------------------                                           PCT <= 0.25 ng/mL                                                 OR                                        > 80% decrease in PCT  Discontinue / Do not initiate                                             antibiotics  Performed at [facility], [address] [place], [place] [address]   TSH     Status: None   Collection Time: 10/01/23 12:35 PM  Result Value Ref Range   TSH 4.363 0.350 - 4.500 uIU/mL    Comment: Performed by a 3rd Generation assay with a functional sensitivity of <=0.01 uIU/mL. Performed at [facility], [address], [place], [place] [address]   Magnesium     Status: None   Collection Time: 10/01/23 12:35 PM  Result Value Ref Range   Magnesium 1.7 1.7 - 2.4 mg/dL    Comment: Performed at [facility], [address], [place], [place] [address]  Lipid panel     Status: Abnormal   Collection Time: 10/01/23 12:35 PM  Result Value Ref Range   Cholesterol 107 0 - 200 mg/dL   Triglycerides 60 <130 mg/dL   HDL 40 (L) >86 mg/dL   Total CHOL/HDL Ratio 2.7 RATIO   VLDL 12 0 - 40 mg/dL   LDL Cholesterol 55 0 - 99 mg/dL    Comment:        Total Cholesterol/HDL:CHD Risk Coronary Heart Disease Risk Table                     Men   Women  1/2 Average Risk   3.4   3.3  Average Risk       5.0   4.4  2 X Average Risk   9.6   7.1  3 X Average Risk  23.4   11.0        Use the calculated Patient Ratio above and the CHD Risk Table to determine the patient's CHD Risk.        ATP III CLASSIFICATION (LDL):  <100     mg/dL   Optimal  578-469  mg/dL   Near or Above                    Optimal  130-159  mg/dL   Borderline  629-528  mg/dL   High  >413     mg/dL   Very High Performed at [facility], [address] [place], [place] [address]   Hemoglobin A1c     Status: None    Collection Time: 10/01/23 12:35 PM  Result Value Ref Range   Hgb A1c MFr Bld 5.4 4.8 - 5.6 %    Comment: (NOTE) Pre diabetes:          5.7%-6.4%  Diabetes:              >6.4%  Glycemic control for   <7.0% adults with diabetes    Mean Plasma Glucose 108.28 mg/dL    Comment: Performed at [facility], [address], [place], [place] [address]  Basic metabolic panel     Status: Abnormal   Collection Time: 10/01/23 12:35 PM  Result Value Ref Range   Sodium 142 135 - 145 mmol/L   Potassium 3.1 (L) 3.5 - 5.1 mmol/L   Chloride 106 98 - 111 mmol/L   CO2 22 22 - 32 mmol/L   Glucose, Bld 95 70 - 99 mg/dL  Comment: Glucose reference range applies only to samples taken after fasting for at least 8 hours.   BUN 26 (H) 8 - 23 mg/dL   Creatinine, Ser 1.61 0.61 - 1.24 mg/dL   Calcium 8.7 (L) 8.9 - 10.3 mg/dL   GFR, Estimated >09 >60 mL/min    Comment: (NOTE) Calculated using the CKD-EPI Creatinine Equation (2021)    Anion gap 14 5 - 15    Comment: Performed at [facility], [address], [place], [place] [address]  Vitamin B12     Status: Abnormal   Collection Time: 10/01/23 12:35 PM  Result Value Ref Range   Vitamin B-12 1,007 (H) 180 - 914 pg/mL    Comment: (NOTE) This assay is not validated for testing neonatal or myeloproliferative syndrome specimens for Vitamin B12 levels. Performed at [facility], [address], [place], [place] [address]   Strep pneumoniae urinary antigen     Status: None   Collection Time: 10/01/23 12:39 PM  Result Value Ref Range   Strep Pneumo Urinary Antigen NEGATIVE NEGATIVE    Comment:        Infection due to S. pneumoniae cannot be absolutely ruled out since the antigen present may be below the detection limit of the test. Performed at [facility], [address], [place], [place] [address]    ECHOCARDIOGRAM COMPLETE Result Date: 10/01/2023    ECHOCARDIOGRAM REPORT   Patient Name:   [name] Date of Exam: 10/01/2023 Medical Rec #:  [id number]    Height:       66.0 in Accession #:    [id number]   Weight:       146.4 lb Date of Birth:  03/05/1928    BSA:          1.751 m Patient Age:    89 years     BP:           138/73 mmHg Patient Gender: M            HR:           42 bpm. Exam Location:  Inpatient Procedure: 2D Echo, Cardiac Doppler and Color Doppler Indications:    Congestive Heart Failure I50.9  History:        Patient has prior history of Echocardiogram examinations, most                 recent 01/12/2016. Previous Myocardial Infarction,                 Arrythmias:Atrial Fibrillation, Signs/Symptoms:Chest Pain; Risk                 Factors:Hypertension.  Sonographer:    Webb Laws Referring Phys: [id number] KATY L FOUST IMPRESSIONS  1. Left ventricular ejection fraction, by estimation, is 55 to 60%. The left ventricle has normal function. The left ventricle has no regional wall motion abnormalities. There is mild concentric left ventricular hypertrophy. Left ventricular diastolic function could not be evaluated.  2. Right ventricular systolic function is moderately reduced. The right ventricular size is moderately enlarged. There is moderately elevated pulmonary artery systolic pressure. The estimated right ventricular systolic pressure is 56.7 mmHg.  3. Left atrial size was severely dilated.  4. Right atrial size was severely dilated.  5. The mitral valve is degenerative. Severe mitral valve regurgitation. No evidence of mitral stenosis.  6. The aortic valve is tricuspid. There is moderate calcification of the aortic valve. There is moderate thickening  of the aortic valve. Aortic valve regurgitation is mild. Aortic valve sclerosis/calcification is present, without any evidence of aortic stenosis.  7. The inferior vena cava is dilated in size with <50% respiratory variability, suggesting right atrial pressure of 15 mmHg. FINDINGS  Left Ventricle: Left ventricular ejection fraction, by  estimation, is 55 to 60%. The left ventricle has normal function. The left ventricle has no regional wall motion abnormalities. The left ventricular internal cavity size was normal in size. There is  mild concentric left ventricular hypertrophy. Left ventricular diastolic function could not be evaluated due to atrial fibrillation. Left ventricular diastolic function could not be evaluated. Right Ventricle: The right ventricular size is moderately enlarged. No increase in right ventricular wall thickness. Right ventricular systolic function is moderately reduced. There is moderately elevated pulmonary artery systolic pressure. The tricuspid  regurgitant velocity is 3.23 m/s, and with an assumed right atrial pressure of 15 mmHg, the estimated right ventricular systolic pressure is 56.7 mmHg. Left Atrium: Left atrial size was severely dilated. Right Atrium: Right atrial size was severely dilated. Pericardium: There is no evidence of pericardial effusion. Mitral Valve: The mitral valve is degenerative in appearance. Severe mitral valve regurgitation. No evidence of mitral valve stenosis. Tricuspid Valve: The tricuspid valve is grossly normal. Tricuspid valve regurgitation is mild . No evidence of tricuspid stenosis. Aortic Valve: The aortic valve is tricuspid. There is moderate calcification of the aortic valve. There is moderate thickening of the aortic valve. Aortic valve regurgitation is mild. Aortic regurgitation PHT measures 678 msec. Aortic valve sclerosis/calcification is present, without any evidence of aortic stenosis. Aortic valve peak gradient measures 10.9 mmHg. Pulmonic Valve: The pulmonic valve was grossly normal. Pulmonic valve regurgitation is trivial. No evidence of pulmonic stenosis. Aorta: The aortic root is normal in size and structure. Venous: The inferior vena cava is dilated in size with less than 50% respiratory variability, suggesting right atrial pressure of 15 mmHg. IAS/Shunts: The atrial  septum is grossly normal.  LEFT VENTRICLE PLAX 2D LVIDd:         5.40 cm     Diastology LVIDs:         2.50 cm     LV e' medial:    3.81 cm/s LV PW:         1.45 cm     LV E/e' medial:  28.9 LV IVS:        1.15 cm     LV e' lateral:   5.44 cm/s LVOT diam:     2.20 cm     LV E/e' lateral: 20.2 LV SV:         92 LV SV Index:   53 LVOT Area:     3.80 cm  LV Volumes (MOD) LV vol d, MOD A4C: 91.4 ml LV vol s, MOD A4C: 35.6 ml LV SV MOD A4C:     91.4 ml RIGHT VENTRICLE            IVC RV Basal diam:  4.40 cm    IVC diam: 2.40 cm RV S prime:     8.16 cm/s TAPSE (M-mode): 1.9 cm LEFT ATRIUM              Index         RIGHT ATRIUM           Index LA diam:        5.85 cm  3.34 cm/m    RA Area:     29.20 cm LA Vol (A2C):  102.0 ml 58.24 ml/m   RA Volume:   88.40 ml  50.47 ml/m LA Vol (A4C):   184.0 ml 105.06 ml/m LA Biplane Vol: 139.0 ml 79.36 ml/m  AORTIC VALVE AV Area (Vmax): 2.33 cm AV Vmax:        165.00 cm/s AV Peak Grad:   10.9 mmHg LVOT Vmax:      101.00 cm/s LVOT Vmean:     67.400 cm/s LVOT VTI:       0.243 m AI PHT:         678 msec  AORTA Ao Root diam: 3.70 cm MITRAL VALVE                  TRICUSPID VALVE MV Area (PHT): 4.49 cm       TR Peak grad:   41.7 mmHg MV Decel Time: 169 msec       TR Vmax:        323.00 cm/s MR Peak grad:    158.3 mmHg MR Mean grad:    87.5 mmHg    SHUNTS MR Vmax:         629.00 cm/s  Systemic VTI:  0.24 m MR Vmean:        429.5 cm/s   Systemic Diam: 2.20 cm MR PISA:         2.26 cm MR PISA Eff ROA: 12 mm MR PISA Radius:  0.60 cm MV E velocity: 110.00 cm/s MV A velocity: 61.20 cm/s MV E/A ratio:  1.80 Lennie Odor MD Electronically signed by Lennie Odor MD Signature Date/Time: 10/01/2023/3:06:28 PM    Final    CT Angio Chest PE W/Cm &/Or Wo Cm Result Date: 10/01/2023 CLINICAL DATA:  Weakness for 1 week. Stopped taking meds. Positive D-dimer. PE suspected. EXAM: CT ANGIOGRAPHY CHEST WITH CONTRAST TECHNIQUE: Multidetector CT imaging of the chest was performed using the standard  protocol during bolus administration of intravenous contrast. Multiplanar CT image reconstructions and MIPs were obtained to evaluate the vascular anatomy. RADIATION DOSE REDUCTION: This exam was performed according to the departmental dose-optimization program which includes automated exposure control, adjustment of the mA and/or kV according to patient size and/or use of iterative reconstruction technique. CONTRAST:  75mL OMNIPAQUE IOHEXOL 350 MG/ML SOLN COMPARISON:  Same day chest radiograph and CTA 02/18/2023 FINDINGS: Cardiovascular: Marked cardiomegaly. No pericardial effusion. No pulmonary embolism. Dilated main pulmonary artery measuring 39 mm. Dilated ascending aorta measuring 49 mm in diameter. This has increased from 02/18/2023 when it measured 47 mm using similar measuring technique. Evaluation for dissection is limited due to contrast timing. Reflux of contrast into the IVC and hepatic veins compatible with elevated right heart pressures. Advanced coronary artery and aortic atherosclerotic calcification. Mediastinum/Nodes: Trachea and esophagus are unremarkable. No thoracic adenopathy. Lungs/Pleura: Large right and small left pleural effusions. Patchy ground-glass opacities bilaterally. Compressive atelectasis in the lower lobes, right middle lobe, and lingula. No pneumothorax. Upper Abdomen: No acute abnormality. Musculoskeletal: No acute fracture. Review of the MIP images confirms the above findings. IMPRESSION: 1. No pulmonary embolism. 2. Large right and small left pleural effusions with compressive atelectasis. 3. Patchy ground-glass opacities bilaterally, likely edema. Atypical infection considered less likely though not excluded. 4. Marked cardiomegaly with dilated main pulmonary artery and reflux of contrast into the IVC and hepatic veins compatible with elevated right heart pressures and pulmonary arterial hypertension. 5. Dilated ascending aorta measuring 49 mm in diameter. This has increased  from 02/18/2023 when it measured 47 mm using similar measuring technique. Ascending thoracic  aortic aneurysm. Recommend semi-annual imaging followup by CTA or MRA and referral to cardiothoracic surgery if not already obtained. This recommendation follows 2010 ACCF/AHA/AATS/ACR/ASA/SCA/SCAI/SIR/STS/SVM Guidelines for the Diagnosis and Management of Patients With Thoracic Aortic Disease. Circulation. 2010; 121: W098-J191. Aortic aneurysm NOS (ICD10-I71.9) 6. Aortic Atherosclerosis (ICD10-I70.0). Electronically Signed   By: Minerva Fester M.D.   On: 10/01/2023 02:53   DG Chest Port 1 View Result Date: 10/01/2023 CLINICAL DATA:  Shortness of breath and weakness EXAM: PORTABLE CHEST 1 VIEW COMPARISON:  03/11/2023 radiographs FINDINGS: Cardiomegaly. Coronary stenting. Aortic atherosclerotic calcification. Layering bilateral pleural effusions and associated airspace opacities. No pneumothorax. No displaced rib fractures. IMPRESSION: Layering bilateral pleural effusions and associated airspace opacities. Cardiomegaly. Electronically Signed   By: Minerva Fester M.D.   On: 10/01/2023 01:25   CT Head Wo Contrast Result Date: 09/30/2023 CLINICAL DATA:  Neuro deficit, acute, stroke suspected.  Weakness. EXAM: CT HEAD WITHOUT CONTRAST TECHNIQUE: Contiguous axial images were obtained from the base of the skull through the vertex without intravenous contrast. RADIATION DOSE REDUCTION: This exam was performed according to the departmental dose-optimization program which includes automated exposure control, adjustment of the mA and/or kV according to patient size and/or use of iterative reconstruction technique. COMPARISON:  Head CT 03/11/2023 FINDINGS: Brain: There is no evidence of an acute infarct, intracranial hemorrhage, mass, midline shift, or extra-axial fluid collection. There is moderate cerebral atrophy. Cerebral white matter hypodensities are unchanged and nonspecific but compatible with mild-to-moderate chronic  small vessel ischemic disease. 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 Vascular: Calcified atherosclerosis at the skull base. No hyperdense vessel. Skull: No acute fracture or suspicious osseous lesion. Sinuses/Orbits: Visualized paranasal sinuses and mastoid air cells are clear. Bilateral cataract extraction. Other: None. IMPRESSION: 1. No evidence of acute intracranial abnormality. 2. Cerebral atrophy and chronic small vessel ischemic disease. Electronically Signed   By: Sebastian Ache M.D.   On: 09/30/2023 16:32    Pending Labs Unresulted Labs (From admission, onward)     Start     Ordered   10/02/23 0500  Basic metabolic panel  Tomorrow morning,   R        10/01/23 0825   10/02/23 0500  CBC  Tomorrow morning,   R        10/01/23 0825   10/02/23 0500  Magnesium  Tomorrow morning,   R        10/01/23 0825            Vitals/Pain Today's Vitals   10/01/23 2005 10/01/23 2013 10/01/23 2030 10/01/23 2100  BP: 135/79  (!) 149/66 135/71  Pulse: (!) 42     Resp: 12  19 17   Temp:  97.6 F (36.4 C)    TempSrc:  Oral    SpO2: 95%     Weight:      Height:      PainSc:        Isolation Precautions No active isolations  Medications Medications  enoxaparin (LOVENOX) injection 40 mg (40 mg Subcutaneous Given 10/01/23 1010)  acetaminophen (TYLENOL) tablet 650 mg (has no administration in time range)    Or  acetaminophen (TYLENOL) suppository 650 mg (has no administration in time range)  polyethylene glycol (MIRALAX / GLYCOLAX) packet 17 g (has no administration in time range)  melatonin tablet 3 mg (has no administration in time range)  isosorbide mononitrate (IMDUR) 24 hr tablet 60 mg (60 mg Oral Given 10/01/23 1003)  hydrALAZINE (APRESOLINE) injection 10 mg (has no administration in  time range)  cefTRIAXone (ROCEPHIN) 2 g in sodium chloride 0.9 % 100 mL IVPB (0 g Intravenous Stopped 10/01/23 1227)  feeding supplement (ENSURE ENLIVE / ENSURE PLUS) liquid 237 mL (237  mLs Oral Given 10/01/23 1945)  albuterol (PROVENTIL) (2.5 MG/3ML) 0.083% nebulizer solution 2.5 mg (has no administration in time range)  pantoprazole (PROTONIX) EC tablet 40 mg (40 mg Oral Given 10/01/23 1003)  allopurinol (ZYLOPRIM) tablet 100 mg (100 mg Oral Given 10/01/23 1006)  aspirin EC tablet 81 mg (81 mg Oral Given 10/01/23 1003)  clopidogrel (PLAVIX) tablet 75 mg (75 mg Oral Given 10/01/23 1003)  levothyroxine (SYNTHROID) tablet 25 mcg (has no administration in time range)  simvastatin (ZOCOR) tablet 20 mg (20 mg Oral Given 10/01/23 1004)  sertraline (ZOLOFT) tablet 25 mg (25 mg Oral Given 10/01/23 1001)  furosemide (LASIX) injection 40 mg (40 mg Intravenous Given 10/01/23 1503)  irbesartan (AVAPRO) tablet 75 mg (75 mg Oral Given 10/01/23 1520)  amLODipine (NORVASC) tablet 5 mg (5 mg Oral Given 10/01/23 1516)  potassium chloride SA (KLOR-CON M) CR tablet 40 mEq (40 mEq Oral Given 10/01/23 1516)  iohexol (OMNIPAQUE) 350 MG/ML injection 75 mL (75 mLs Intravenous Contrast Given 10/01/23 0147)  hydrALAZINE (APRESOLINE) injection 5 mg (5 mg Intravenous Given 10/01/23 0310)  furosemide (LASIX) injection 20 mg (20 mg Intravenous Given 10/01/23 0517)  amLODipine (NORVASC) tablet 2.5 mg (2.5 mg Oral Given 10/01/23 0643)  magnesium sulfate IVPB 2 g 50 mL (0 g Intravenous Stopped 10/01/23 1819)    Mobility non-ambulatory

## 2023-10-02 DIAGNOSIS — I340 Nonrheumatic mitral (valve) insufficiency: Secondary | ICD-10-CM

## 2023-10-02 DIAGNOSIS — I2510 Atherosclerotic heart disease of native coronary artery without angina pectoris: Secondary | ICD-10-CM | POA: Diagnosis not present

## 2023-10-02 DIAGNOSIS — I10 Essential (primary) hypertension: Secondary | ICD-10-CM | POA: Diagnosis not present

## 2023-10-02 DIAGNOSIS — I509 Heart failure, unspecified: Secondary | ICD-10-CM | POA: Diagnosis not present

## 2023-10-02 LAB — CBC
HCT: 33.1 % — ABNORMAL LOW (ref 39.0–52.0)
Hemoglobin: 10.5 g/dL — ABNORMAL LOW (ref 13.0–17.0)
MCH: 32.1 pg (ref 26.0–34.0)
MCHC: 31.7 g/dL (ref 30.0–36.0)
MCV: 101.2 fL — ABNORMAL HIGH (ref 80.0–100.0)
Platelets: 144 10*3/uL — ABNORMAL LOW (ref 150–400)
RBC: 3.27 MIL/uL — ABNORMAL LOW (ref 4.22–5.81)
RDW: 16.5 % — ABNORMAL HIGH (ref 11.5–15.5)
WBC: 8.8 10*3/uL (ref 4.0–10.5)
nRBC: 0.2 % (ref 0.0–0.2)

## 2023-10-02 LAB — BASIC METABOLIC PANEL
Anion gap: 15 (ref 5–15)
BUN: 36 mg/dL — ABNORMAL HIGH (ref 8–23)
CO2: 20 mmol/L — ABNORMAL LOW (ref 22–32)
Calcium: 8.7 mg/dL — ABNORMAL LOW (ref 8.9–10.3)
Chloride: 104 mmol/L (ref 98–111)
Creatinine, Ser: 1.2 mg/dL (ref 0.61–1.24)
GFR, Estimated: 56 mL/min — ABNORMAL LOW (ref >60)
Glucose, Bld: 94 mg/dL (ref 70–99)
Potassium: 4.3 mmol/L (ref 3.5–5.1)
Sodium: 139 mmol/L (ref 135–145)

## 2023-10-02 LAB — MAGNESIUM: Magnesium: 2.4 mg/dL (ref 1.7–2.4)

## 2023-10-02 LAB — MRSA NEXT GEN BY PCR, NASAL: MRSA by PCR Next Gen: NOT DETECTED

## 2023-10-02 MED ORDER — FUROSEMIDE 10 MG/ML IJ SOLN
40.0000 mg | Freq: Once | INTRAMUSCULAR | Status: AC
  Administered 2023-10-02: 40 mg via INTRAVENOUS
  Filled 2023-10-02: qty 4

## 2023-10-02 MED ORDER — AMLODIPINE BESYLATE 10 MG PO TABS
10.0000 mg | ORAL_TABLET | Freq: Every day | ORAL | Status: DC
  Administered 2023-10-03 – 2023-10-05 (×3): 10 mg via ORAL
  Filled 2023-10-02 (×3): qty 1

## 2023-10-02 MED ORDER — CHLORHEXIDINE GLUCONATE CLOTH 2 % EX PADS
6.0000 | MEDICATED_PAD | Freq: Every day | CUTANEOUS | Status: DC
  Administered 2023-10-02: 6 via TOPICAL

## 2023-10-02 NOTE — ED Notes (Signed)
Patient resting in bed.

## 2023-10-02 NOTE — Progress Notes (Signed)
PROGRESS NOTE  [name] ZOX:[id number] DOB: 15-Aug-1928 DOA: 09/30/2023 PCP: Stevphen Rochester, MD   LOS: 1 day   Brief Narrative / Interim history: 89 year old male with history of CAD with DES to RCA in 2017, DES to ramus intermedius 2018, PAF not on anticoagulation due to falls, status post left CEA, prior CVA, HTN, HLD, hypothyroidism comes into the hospital with weakness, difficulties ambulating, decreased p.o. intake, dyspnea on exertion and increased sleepiness, fatigue.  He was recently treated for pneumonia with azithromycin as an outpatient.  Family reports noncompliance with medications at home.  She was found to have fluid overload on admission with bilateral pleural effusions, elevated BNP.  He was placed on Lasix and cardiology was consulted  Subjective / 24h Interval events: No specific complaints this morning, denies any chest pain, denies any shortness of breath in bed.  Assesement and Plan: Principal Problem:   New onset of congestive heart failure (HCC) Active Problems:   Essential hypertension   HLD (hyperlipidemia)   Atrial fibrillation with slow ventricular response (HCC)   Hypertensive urgency   Hypothyroid   Principal problem Acute on chronic diastolic CHF -patient was admitted to the hospital with fluid overload, bilateral pleural effusions, elevated BNP.  Underwent a 2D echo on 2/11 which showed LVEF 55-60%, no WMA.  There was moderately elevated PA pressure 56.7.  Severe mitral valve regurgitation was noted. -he was placed on IV furosemide, continue. Cardiology consulted, appreciate input  Active problems Possible CAP - on ceftriaxone, continue. Likely stop antibiotics tomorrow if afebrile and continues to improve with diuretics  Hypertensive urgency -currently on Norvasc, Avapro, Imdur.  Blood pressure trend improved  CAD -no chest pain  Hyperlipidemia -continue statin  A-fib with slow ventricular response-heart rate in the 30s this morning on  telemetry while he was asleep.  Asymptomatic.  Not on beta-blockers currently.  TSH normal.  Appreciate cardiology follow-up  Hypothyroidism-continue Synthroid  Lab Results  Component Value Date   TSH 4.363 10/01/2023   Scheduled Meds:  allopurinol  100 mg Oral Daily   [START ON 10/03/2023] amLODipine  10 mg Oral Daily   aspirin EC  81 mg Oral Daily   Chlorhexidine Gluconate Cloth  6 each Topical Daily   clopidogrel  75 mg Oral Daily   enoxaparin (LOVENOX) injection  40 mg Subcutaneous Q24H   feeding supplement  237 mL Oral TID BM   furosemide  40 mg Intravenous Daily   furosemide  40 mg Intravenous Once   irbesartan  75 mg Oral Daily   isosorbide mononitrate  60 mg Oral Daily   levothyroxine  25 mcg Oral QAC breakfast   pantoprazole  40 mg Oral Daily   sertraline  25 mg Oral Daily   simvastatin  20 mg Oral Daily   Continuous Infusions:  cefTRIAXone (ROCEPHIN)  IV Stopped (10/02/23 0936)   PRN Meds:.acetaminophen **OR** acetaminophen, albuterol, hydrALAZINE, melatonin, polyethylene glycol  Current Outpatient Medications  Medication Instructions   acetaminophen (TYLENOL) 500 mg, Oral, Every 6 hours PRN   allopurinol (ZYLOPRIM) 100 mg, Daily   ascorbic acid (VITAMIN C) 500 mg, Daily   aspirin EC 81 mg, Oral, Daily   azithromycin (ZITHROMAX) 250 mg, As needed   b complex vitamins tablet 1 tablet, Daily   Calcium Carbonate (CALCIUM 500 PO) 500 mg, Daily   Calcium Carbonate Antacid (TUMS PO) 1 tablet, As needed   clopidogrel (PLAVIX) 75 mg, Oral, Daily   colchicine 0.6 mg, Daily PRN   ferrous sulfate 325 mg,  2 times daily with meals   finasteride (PROSCAR) 5 mg, Daily   Fish Oil 1,000 mg, Daily   folic acid (FOLVITE) 1 mg, Oral, Daily   Glucosamine 500 mg, Daily   HYDROcodone-acetaminophen (NORCO/VICODIN) 5-325 MG tablet 1 tablet, Oral, Every 6 hours PRN   isosorbide mononitrate (IMDUR) 90 mg, Oral, Daily   levothyroxine (SYNTHROID) 25 mcg, Daily   lidocaine (HM LIDOCAINE  PATCH) 4 % 1 patch, Transdermal, Every 24 hours   lisinopril (ZESTRIL) 20 mg, Oral, Daily   Multiple Vitamin (MULTIVITAMIN) capsule 1 capsule, Daily   nitroGLYCERIN (NITROSTAT) 0.4 MG SL tablet PLACE 1 TABLET UNDER THE TONGUE EVERY 5 MINUTES AS NEEDED FOR CHEST PAIN. NOT TO EXCEED 3 DOSES   polyethylene glycol (MIRALAX) 17 g, Oral, Daily   ranolazine (RANEXA) 1000 MG SR tablet TAKE 1 TABLET TWICE A DAY   sertraline (ZOLOFT) 25 mg, Oral, Daily   simvastatin (ZOCOR) 20 MG tablet TAKE 1 TABLET DAILY   Vitamin D-3 1,000 Units, Daily   zinc sulfate (50mg  elemental zinc) 220 mg, Oral, Daily    Diet Orders (From admission, onward)     Start     Ordered   10/01/23 0824  Diet Heart Room service appropriate? Yes; Fluid consistency: Thin  Diet effective now       Question Answer Comment  Room service appropriate? Yes   Fluid consistency: Thin      10/01/23 0825            DVT prophylaxis: enoxaparin (LOVENOX) injection 40 mg Start: 10/01/23 1000   Lab Results  Component Value Date   PLT 144 (L) 10/02/2023      Code Status: Full Code  Family Communication: no family at bedside   Status is: Inpatient Remains inpatient appropriate because: severity of illness  Level of care: Stepdown  Consultants:  Cardiology   Objective: Vitals:   10/02/23 0800 10/02/23 0900 10/02/23 1000 10/02/23 1140  BP: (!) 169/125 (!) 161/71 (!) 169/79   Pulse: (!) 36 (!) 41 (!) 48   Resp: (!) 21 (!) 24 19   Temp:    (!) 96.8 F (36 C)  TempSrc:    Axillary  SpO2: 100% 100% 98%   Weight:      Height:        Intake/Output Summary (Last 24 hours) at 10/02/2023 1414 Last data filed at 10/02/2023 1400 Gross per 24 hour  Intake 50 ml  Output 1201 ml  Net -1151 ml   Wt Readings from Last 3 Encounters:  10/02/23 62.8 kg  05/13/23 66.4 kg  03/11/23 73 kg    Examination:  Constitutional: NAD Eyes: no scleral icterus ENMT: Mucous membranes are moist.  Neck: normal, supple Respiratory:  clear to auscultation bilaterally, no wheezing, no crackles. Normal respiratory effort. No accessory muscle use.  Cardiovascular: Regular rate and rhythm, no murmurs / rubs / gallops. No LE edema.  Abdomen: non distended, no tenderness. Bowel sounds positive.  Musculoskeletal: no clubbing / cyanosis.  Skin: no rashes Neurologic: non focal   Data Reviewed: I have independently reviewed following labs and imaging studies   CBC Recent Labs  Lab 09/30/23 1556 10/01/23 1235 10/02/23 0259  WBC 7.3 8.2 8.8  HGB 11.2* 10.4* 10.5*  HCT 34.5* 31.8* 33.1*  PLT 154 150 144*  MCV 99.1 99.1 101.2*  MCH 32.2 32.4 32.1  MCHC 32.5 32.7 31.7  RDW 16.2* 16.0* 16.5*    Recent Labs  Lab 09/30/23 1556 10/01/23 0044 10/01/23 0045 10/01/23 1235 10/02/23  0259  NA 137  --   --  142 139  K 3.8  --   --  3.1* 4.3  CL 104  --   --  106 104  CO2 19*  --   --  22 20*  GLUCOSE 96  --   --  95 94  BUN 25*  --   --  26* 36*  CREATININE 1.05  --   --  1.00 1.20  CALCIUM 8.6*  --   --  8.7* 8.7*  AST  --  51*  --   --   --   ALT  --  29  --   --   --   ALKPHOS  --  90  --   --   --   BILITOT  --  1.5*  --   --   --   ALBUMIN  --  3.7  --   --   --   MG  --   --   --  1.7 2.4  DDIMER  --  0.96*  --   --   --   PROCALCITON  --   --   --  <0.10  --   TSH  --   --   --  4.363  --   HGBA1C  --   --   --  5.4  --   BNP  --   --  2,995.3*  --   --     ------------------------------------------------------------------------------------------------------------------ Recent Labs    10/01/23 1235  CHOL 107  HDL 40*  LDLCALC 55  TRIG 60  CHOLHDL 2.7    Lab Results  Component Value Date   HGBA1C 5.4 10/01/2023   ------------------------------------------------------------------------------------------------------------------ Recent Labs    10/01/23 1235  TSH 4.363    Cardiac Enzymes No results for input(s): "CKMB", "TROPONINI", "MYOGLOBIN" in the last 168 hours.  Invalid input(s):  "CK" ------------------------------------------------------------------------------------------------------------------    Component Value Date/Time   BNP 2,995.3 (H) 10/01/2023 0045    CBG: Recent Labs  Lab 09/30/23 2356  GLUCAP 139*    Recent Results (from the past 240 hours)  Resp panel by RT-PCR (RSV, Flu A&B, Covid) Anterior Nasal Swab     Status: None   Collection Time: 09/30/23  3:56 PM   Specimen: Anterior Nasal Swab  Result Value Ref Range Status   SARS Coronavirus 2 by RT PCR NEGATIVE NEGATIVE Final    Comment: (NOTE) SARS-CoV-2 target nucleic acids are NOT DETECTED.  The SARS-CoV-2 RNA is generally detectable in upper respiratory specimens during the acute phase of infection. The lowest concentration of SARS-CoV-2 viral copies this assay can detect is 138 copies/mL. A negative result does not preclude SARS-Cov-2 infection and should not be used as the sole basis for treatment or other patient management decisions. A negative result may occur with  improper specimen collection/handling, submission of specimen other than nasopharyngeal swab, presence of viral mutation(s) within the areas targeted by this assay, and inadequate number of viral copies(<138 copies/mL). A negative result must be combined with clinical observations, patient history, and epidemiological information. The expected result is Negative.  Fact Sheet for Patients:  BloggerCourse.com  Fact Sheet for Healthcare Providers:  SeriousBroker.it  This test is no t yet approved or cleared by the [place] FDA and  has been authorized for detection and/or diagnosis of SARS-CoV-2 by FDA under an Emergency Use Authorization (EUA). This EUA will remain  in effect (meaning this test can be used) for the duration of  the COVID-19 declaration under Section 564(b)(1) of the Act, 21 U.S.C.section 360bbb-3(b)(1), unless the authorization is terminated  or  revoked sooner.       Influenza A by PCR NEGATIVE NEGATIVE Final   Influenza B by PCR NEGATIVE NEGATIVE Final    Comment: (NOTE) The Xpert Xpress SARS-CoV-2/FLU/RSV plus assay is intended as an aid in the diagnosis of influenza from Nasopharyngeal swab specimens and should not be used as a sole basis for treatment. Nasal washings and aspirates are unacceptable for Xpert Xpress SARS-CoV-2/FLU/RSV testing.  Fact Sheet for Patients: BloggerCourse.com  Fact Sheet for Healthcare Providers: SeriousBroker.it  This test is not yet approved or cleared by the [place] FDA and has been authorized for detection and/or diagnosis of SARS-CoV-2 by FDA under an Emergency Use Authorization (EUA). This EUA will remain in effect (meaning this test can be used) for the duration of the COVID-19 declaration under Section 564(b)(1) of the Act, 21 U.S.C. section 360bbb-3(b)(1), unless the authorization is terminated or revoked.     Resp Syncytial Virus by PCR NEGATIVE NEGATIVE Final    Comment: (NOTE) Fact Sheet for Patients: BloggerCourse.com  Fact Sheet for Healthcare Providers: SeriousBroker.it  This test is not yet approved or cleared by the [place] FDA and has been authorized for detection and/or diagnosis of SARS-CoV-2 by FDA under an Emergency Use Authorization (EUA). This EUA will remain in effect (meaning this test can be used) for the duration of the COVID-19 declaration under Section 564(b)(1) of the Act, 21 U.S.C. section 360bbb-3(b)(1), unless the authorization is terminated or revoked.  Performed at [facility], [address] [place], [place] [address]   MRSA Next Gen by PCR, Nasal     Status: None   Collection Time: 10/02/23  2:32 AM   Specimen: Nasal Mucosa; Nasal Swab  Result Value Ref Range Status   MRSA by PCR Next Gen NOT DETECTED NOT  DETECTED Final    Comment: (NOTE) The GeneXpert MRSA Assay (FDA approved for NASAL specimens only), is one component of a comprehensive MRSA colonization surveillance program. It is not intended to diagnose MRSA infection nor to guide or monitor treatment for MRSA infections. Test performance is not FDA approved in patients less than 14 years old. Performed at [facility], [address], [place], [place] [address]      Radiology Studies: ECHOCARDIOGRAM COMPLETE Result Date: 10/01/2023    ECHOCARDIOGRAM REPORT   Patient Name:   [name] Date of Exam: 10/01/2023 Medical Rec #:  [id number]    Height:       66.0 in Accession #:    [id number]   Weight:       146.4 lb Date of Birth:  1928-02-09    BSA:          1.751 m Patient Age:    89 years     BP:           138/73 mmHg Patient Gender: M            HR:           42 bpm. Exam Location:  Inpatient Procedure: 2D Echo, Cardiac Doppler and Color Doppler Indications:    Congestive Heart Failure I50.9  History:        Patient has prior history of Echocardiogram examinations, most                 recent 01/12/2016. Previous Myocardial Infarction,  Arrythmias:Atrial Fibrillation, Signs/Symptoms:Chest Pain; Risk                 Factors:Hypertension.  Sonographer:    Webb Laws Referring Phys: [id number] KATY L FOUST IMPRESSIONS  1. Left ventricular ejection fraction, by estimation, is 55 to 60%. The left ventricle has normal function. The left ventricle has no regional wall motion abnormalities. There is mild concentric left ventricular hypertrophy. Left ventricular diastolic function could not be evaluated.  2. Right ventricular systolic function is moderately reduced. The right ventricular size is moderately enlarged. There is moderately elevated pulmonary artery systolic pressure. The estimated right ventricular systolic pressure is 56.7 mmHg.  3. Left atrial size was severely dilated.  4. Right atrial size was severely  dilated.  5. The mitral valve is degenerative. Severe mitral valve regurgitation. No evidence of mitral stenosis.  6. The aortic valve is tricuspid. There is moderate calcification of the aortic valve. There is moderate thickening of the aortic valve. Aortic valve regurgitation is mild. Aortic valve sclerosis/calcification is present, without any evidence of aortic stenosis.  7. The inferior vena cava is dilated in size with <50% respiratory variability, suggesting right atrial pressure of 15 mmHg. FINDINGS  Left Ventricle: Left ventricular ejection fraction, by estimation, is 55 to 60%. The left ventricle has normal function. The left ventricle has no regional wall motion abnormalities. The left ventricular internal cavity size was normal in size. There is  mild concentric left ventricular hypertrophy. Left ventricular diastolic function could not be evaluated due to atrial fibrillation. Left ventricular diastolic function could not be evaluated. Right Ventricle: The right ventricular size is moderately enlarged. No increase in right ventricular wall thickness. Right ventricular systolic function is moderately reduced. There is moderately elevated pulmonary artery systolic pressure. The tricuspid  regurgitant velocity is 3.23 m/s, and with an assumed right atrial pressure of 15 mmHg, the estimated right ventricular systolic pressure is 56.7 mmHg. Left Atrium: Left atrial size was severely dilated. Right Atrium: Right atrial size was severely dilated. Pericardium: There is no evidence of pericardial effusion. Mitral Valve: The mitral valve is degenerative in appearance. Severe mitral valve regurgitation. No evidence of mitral valve stenosis. Tricuspid Valve: The tricuspid valve is grossly normal. Tricuspid valve regurgitation is mild . No evidence of tricuspid stenosis. Aortic Valve: The aortic valve is tricuspid. There is moderate calcification of the aortic valve. There is moderate thickening of the aortic valve.  Aortic valve regurgitation is mild. Aortic regurgitation PHT measures 678 msec. Aortic valve sclerosis/calcification is present, without any evidence of aortic stenosis. Aortic valve peak gradient measures 10.9 mmHg. Pulmonic Valve: The pulmonic valve was grossly normal. Pulmonic valve regurgitation is trivial. No evidence of pulmonic stenosis. Aorta: The aortic root is normal in size and structure. Venous: The inferior vena cava is dilated in size with less than 50% respiratory variability, suggesting right atrial pressure of 15 mmHg. IAS/Shunts: The atrial septum is grossly normal.  LEFT VENTRICLE PLAX 2D LVIDd:         5.40 cm     Diastology LVIDs:         2.50 cm     LV e' medial:    3.81 cm/s LV PW:         1.45 cm     LV E/e' medial:  28.9 LV IVS:        1.15 cm     LV e' lateral:   5.44 cm/s LVOT diam:     2.20 cm  LV E/e' lateral: 20.2 LV SV:         92 LV SV Index:   53 LVOT Area:     3.80 cm  LV Volumes (MOD) LV vol d, MOD A4C: 91.4 ml LV vol s, MOD A4C: 35.6 ml LV SV MOD A4C:     91.4 ml RIGHT VENTRICLE            IVC RV Basal diam:  4.40 cm    IVC diam: 2.40 cm RV S prime:     8.16 cm/s TAPSE (M-mode): 1.9 cm LEFT ATRIUM              Index         RIGHT ATRIUM           Index LA diam:        5.85 cm  3.34 cm/m    RA Area:     29.20 cm LA Vol (A2C):   102.0 ml 58.24 ml/m   RA Volume:   88.40 ml  50.47 ml/m LA Vol (A4C):   184.0 ml 105.06 ml/m LA Biplane Vol: 139.0 ml 79.36 ml/m  AORTIC VALVE AV Area (Vmax): 2.33 cm AV Vmax:        165.00 cm/s AV Peak Grad:   10.9 mmHg LVOT Vmax:      101.00 cm/s LVOT Vmean:     67.400 cm/s LVOT VTI:       0.243 m AI PHT:         678 msec  AORTA Ao Root diam: 3.70 cm MITRAL VALVE                  TRICUSPID VALVE MV Area (PHT): 4.49 cm       TR Peak grad:   41.7 mmHg MV Decel Time: 169 msec       TR Vmax:        323.00 cm/s MR Peak grad:    158.3 mmHg MR Mean grad:    87.5 mmHg    SHUNTS MR Vmax:         629.00 cm/s  Systemic VTI:  0.24 m MR Vmean:        429.5  cm/s   Systemic Diam: 2.20 cm MR PISA:         2.26 cm MR PISA Eff ROA: 12 mm MR PISA Radius:  0.60 cm MV E velocity: 110.00 cm/s MV A velocity: 61.20 cm/s MV E/A ratio:  1.80 Lennie Odor MD Electronically signed by Lennie Odor MD Signature Date/Time: 10/01/2023/3:06:28 PM    Final      Pamella Pert, MD, PhD [facility]  Between 7 am - 7 pm I am available, please contact me via Amion (for emergencies) or Securechat (non urgent messages)  Between 7 pm - 7 am I am not available, please contact night coverage MD/APP via Amion

## 2023-10-02 NOTE — ED Notes (Signed)
Patient resting in bed breathing eyes closed

## 2023-10-02 NOTE — Progress Notes (Addendum)
Patient Name: [name] Date of Encounter: 10/02/2023 [facility] Cardiologist: Charlton Haws, MD   Interval Summary  .    No output tract.  Reports improvement in shortness of breath, daughter accompanied with him at the bedside.  Vital Signs .    Vitals:   10/02/23 0700 10/02/23 0730 10/02/23 0800 10/02/23 0900  BP: (!) 161/69  (!) 169/125 (!) 161/71  Pulse: (!) 41  (!) 36 (!) 41  Resp: 17  (!) 21 (!) 24  Temp:  98.5 F (36.9 C)    TempSrc:  Oral    SpO2: 98%  100% 100%  Weight:      Height:        Intake/Output Summary (Last 24 hours) at 10/02/2023 0949 Last data filed at 10/01/2023 1927 Gross per 24 hour  Intake 150 ml  Output 1 ml  Net 149 ml      10/02/2023    5:00 AM 09/30/2023    3:27 PM 05/13/2023    2:23 PM  Last 3 Weights  Weight (lbs) 138 lb 7.2 oz 146 lb 6.2 oz 146 lb 4.8 oz  Weight (kg) 62.8 kg 66.4 kg 66.361 kg      Telemetry/ECG    Atrial fibrillation, heart rates in the 40s to 50s- Personally Reviewed  CV Studies    Echocardiogram 10/01/2023 1. Left ventricular ejection fraction, by estimation, is 55 to 60%. The  left ventricle has normal function. The left ventricle has no regional  wall motion abnormalities. There is mild concentric left ventricular  hypertrophy. Left ventricular diastolic  function could not be evaluated.   2. Right ventricular systolic function is moderately reduced. The right  ventricular size is moderately enlarged. There is moderately elevated  pulmonary artery systolic pressure. The estimated right ventricular  systolic pressure is 56.7 mmHg.   3. Left atrial size was severely dilated.   4. Right atrial size was severely dilated.   5. The mitral valve is degenerative. Severe mitral valve regurgitation.  No evidence of mitral stenosis.   6. The aortic valve is tricuspid. There is moderate calcification of the  aortic valve. There is moderate thickening of the aortic valve. Aortic  valve regurgitation is  mild. Aortic valve sclerosis/calcification is  present, without any evidence of aortic  stenosis.   7. The inferior vena cava is dilated in size with <50% respiratory  variability, suggesting right atrial pressure of 15 mmHg.    Physical Exam .   GEN: No acute distress.   Neck: + JVD Cardiac: Irregular rate Respiratory: Clear to auscultation bilaterally, slight crackles in lower bases, wheezing. GI: Soft, nontender, non-distended  MS: Mild edema  Patient Profile    [name] is a 89 y.o. male has hx of   CAD s/p DES to RCA 5/17, DES to RI '18, carotid artery disease s/p L CEA, HTN, HLD, CVA, paroxsymal Afib (not on long term OAC 2/2 falls), AV block on BB.  Patient admitted for significant decline in functional status/weakness, noted to be short of breath with pulmonary edema.  Assessment & Plan .     Acute HFpEF Suspect he is much more volume off than what he looks like clinically.  BNP almost 3000, CT of the chest showing large right and small left pleural effusion with evidence of elevated heart pressure and pulmonary hypertension.  Echo shows EF 55 to 60% with moderately reduced RV function, RVSP 56.7.  Severe dilated atria, new severe MR, RA pressure 15. No  output tracked. Continue with IV Lasix IV Lasix 40 mg daily.  Will reassess later this afternoon response to diuretics and titrate accordingly. Seems to be no plans for thoracentesis, defer to primary team.  New severe MR In the setting of flash pulmonary edema may need to consider valve rupture damage or other secondary pathology.  May be all related to excessive volume.  Will need to review films with MD.  However not likely to be a candidate for valvular intervention.  Hypertensive urgency BP improved however still with labile readings.  Will continue to follow and adjust accordingly. Currently on amlodipine 5 mg (increase to 10mg ), irbesartan 75 mg, Imdur 60 mg  CAD status post DES to RCA/RI Widely patent stents in  2018.  No anginal complaints here. Continue with DAPT aspirin Plavix, Imdur, simvastatin  Permanent atrial fibrillation with slow ventricular rate He is in the 40s and 50s most this time.  Not on anticoagulation given frequent falls.  Beta-blocker contraindicated given past history.  Aortic aneurysm 49 mm measured on CTA here.  Follow-up patient.  Pneumonia Hypothyroidism  Carotid artery disease Hx of CVA Per primary team  For questions or updates, please contact [facility] Please consult [email or web address] for contact info under    Signed, Abagail Kitchens, PA-C    Patient seen and examined, note reviewed with the signed Advanced Practice Provider. I personally reviewed laboratory data, imaging studies and relevant notes. I independently examined the patient and formulated the important aspects of the plan. I have personally discussed the plan with the patient and/or family. Comments or changes to the note/plan are indicated below.  Patient seen and examined at his bedside.   Acute heart failure with preserved EF  New Severe Moderate Regurgitation  Hypertensive Urgency CAD status post DES to RCA Permanent atrial fibrillation with slow ventricular rate  Aortic Aneurysm   He still remains to be volume overloaded  will continue to benefit from IV Lasix. Echo shows 55 to 60%  with severe mitral regurgitation. He will be better served right now with medical therapy. He is on board for this.  Blood pressure is still elevated - continue Amlodipine 5 mg daily, will increase Irbesartan 150 mg daily, Imdur 60 mg daily.   No anginal symptoms.   Slow atrial fibrillation - off beta blocker. Shared decision in the past for no anticoagulation.   Will continue to follow   Thomasene Ripple DO, MS [facility] Attending Cardiologist [facility]  [address] [place], [place] [address] [phone] Website: [email or web address]

## 2023-10-02 NOTE — Evaluation (Signed)
Occupational Therapy Evaluation Patient Details Name: [name] MRN: [id number] DOB: [birth year] Today's Date: 10/02/2023   History of Present Illness   Patient is a 89 year old male who presented with week history of weakness and noncompliance with medications. Patient was admitted with acute on chronic diastolic heart failure exacerbation, hypertensive urgency, congestive heart failure, community acquired pneumonia. PMH: afib with slow ventricular response, MI,arthritis, stroke, cardia cathx2     Clinical Impressions Patient is a 89 year old male who was admitted for above. Patient was living at home at rolling walker level with family support per patient report. Currently, patient was +2 for standing with increased shakiness in BLE with HR ranging from 43 to 56 bpm with movement. Patient quick to fatigue with minimal movement. Patient was noted to have decreased functional activity tolernace, decreased ROM, decreased BUE strength, decreased endurance, decreased sitting balance, decreased standing balanced, decreased safety awareness, and decreased knowledge of AE/AD impacting participation in ADLs. Patient would continue to benefit from skilled OT services at this time while admitted and after d/c to address noted deficits in order to improve overall safety and independence in ADLs. Patient will benefit from continued inpatient follow up therapy, <3 hours/day.      If plan is discharge home, recommend the following:   Two people to help with walking and/or transfers;A lot of help with bathing/dressing/bathroom;Assistance with cooking/housework;Direct supervision/assist for medications management;Assist for transportation;Help with stairs or ramp for entrance;Direct supervision/assist for financial management     Functional Status Assessment   Patient has had a recent decline in their functional status and/or demonstrates limited ability to make significant improvements in function in a  reasonable and predictable amount of time     Equipment Recommendations   None recommended by OT      Precautions/Restrictions   Precautions Precautions: Fall Precaution/Restrictions Comments: monitor vitals Restrictions Weight Bearing Restrictions Per Provider Order: No     Mobility Bed Mobility Overal bed mobility: Needs Assistance Bed Mobility: Supine to Sit, Sit to Supine     Supine to sit: Min assist Sit to supine: Min assist   General bed mobility comments: with increased time.         Balance Overall balance assessment: Needs assistance   Sitting balance-Leahy Scale: Poor     Standing balance support: Reliant on assistive device for balance, Bilateral upper extremity supported Standing balance-Leahy Scale: Zero           ADL either performed or assessed with clinical judgement   ADL Overall ADL's : Needs assistance/impaired   Eating/Feeding Details (indicate cue type and reason): declined to participate in self feeding at this time. Grooming: Sitting;Contact guard assist   Upper Body Bathing: Sitting;Minimal assistance   Lower Body Bathing: Sitting/lateral leans;Maximal assistance Lower Body Bathing Details (indicate cue type and reason): unable to figure four Upper Body Dressing : Sitting;Contact guard assist   Lower Body Dressing: Maximal assistance;Sitting/lateral leans   Toilet Transfer: Minimal assistance;+2 for physical assistance;+2 for safety/equipment;Rolling walker (2 wheels) Toilet Transfer Details (indicate cue type and reason): to take small steps EOB with increased time. Toileting- Clothing Manipulation and Hygiene: Bed level;Total assistance               Vision Baseline Vision/History: 1 Wears glasses Additional Comments: difficulty with grasping sock from therapist hand with patient reporting no changes in vision            Pertinent Vitals/Pain Pain Assessment Pain Assessment: No/denies pain  Extremity/Trunk Assessment Upper Extremity Assessment Upper Extremity Assessment: Difficult to assess due to impaired cognition   Lower Extremity Assessment Lower Extremity Assessment: Defer to PT evaluation   Cervical / Trunk Assessment Cervical / Trunk Assessment: Other exceptions Cervical / Trunk Exceptions: cecactic      Cognition Arousal: Alert Behavior During Therapy: Flat affect               OT - Cognition Comments: HOH with hearing aids in place. patient reported today was sunday. oriented to self.                 Following commands: Impaired                  Home Living Family/patient expects to be discharged to:: Private residence Living Arrangements: Children;Other relatives (grandson) Available Help at Discharge: Family;Available PRN/intermittently Type of Home: House Home Access: Stairs to enter [employer] of Steps: 5 Entrance Stairs-Rails: Right;Left Home Layout: Multi-level Alternate Level Stairs-Number of Steps: 7 steps + 7 steps   Bathroom Shower/Tub: Tub/shower unit;Sponge bathes at baseline   Firefighter: Standard     Home Equipment: Hospital bed;Shower seat;BSC/3in1;Standard Environmental consultant   Additional Comments: lives in a multi level duplex w/ pt bedroom typically on second floor. in the past, pt has stayed on first floor in living room w/ hospital bed. another daughter, son and nephew live nearby and can assist.      Prior Functioning/Environment Prior Level of Function : Patient poor historian/Family not available             Mobility Comments: using standard walker for mobility, no recent falls, typically able to manage stairs though slowly ADLs Comments: sponge bathing mostly at baseline, reports able to dress self, reported being able to complete ADLs during the day and make simple meal    OT Problem List: Impaired balance (sitting and/or standing);Decreased knowledge of precautions;Decreased safety  awareness;Cardiopulmonary status limiting activity;Decreased knowledge of use of DME or AE;Decreased activity tolerance   OT Treatment/Interventions: Self-care/ADL training;DME and/or AE instruction;Therapeutic activities;Balance training;Patient/family education;Energy conservation      OT Goals(Current goals can be found in the care plan section)   Acute Rehab OT Goals Patient Stated Goal: to get better at walking OT Goal Formulation: Patient unable to participate in goal setting Time For Goal Achievement: 10/16/23 Potential to Achieve Goals: Fair   OT Frequency:  Min 1X/week    Co-evaluation PT/OT/SLP Co-Evaluation/Treatment: Yes Reason for Co-Treatment: To address functional/ADL transfers;For patient/therapist safety PT goals addressed during session: Mobility/safety with mobility OT goals addressed during session: ADL's and self-care      AM-PAC OT "6 Clicks" Daily Activity     Outcome Measure Help from another person eating meals?: A Little Help from another person taking care of personal grooming?: A Lot Help from another person toileting, which includes using toliet, bedpan, or urinal?: Total Help from another person bathing (including washing, rinsing, drying)?: A Lot Help from another person to put on and taking off regular upper body clothing?: A Lot Help from another person to put on and taking off regular lower body clothing?: A Lot 6 Click Score: 12   End of Session Equipment Utilized During Treatment: Gait belt;Rolling walker (2 wheels) Nurse Communication: Mobility status  Activity Tolerance: Patient limited by fatigue Patient left: in bed;with call bell/phone within reach;with bed alarm set  OT Visit Diagnosis: Unsteadiness on feet (R26.81);Other abnormalities of gait and mobility (R26.89);Muscle weakness (generalized) (M62.81)  Time:  -    Charges:  OT General Charges $OT Visit: 1 Visit OT Evaluation $OT Eval Moderate Complexity: 1  Mod  Medora Roorda OTR/L, MS Acute Rehabilitation Department Office# [phone]   Selinda Flavin 10/02/2023, 3:53 PM

## 2023-10-02 NOTE — ED Notes (Signed)
Patient resting in bed new brief placed on patient

## 2023-10-02 NOTE — Evaluation (Signed)
Physical Therapy Evaluation Patient Details Name: [name] MRN: [id number] DOB: 12-15-1927 Today's Date: 10/02/2023  History of Present Illness  Patient is a 89 year old male who presented with week history of weakness and noncompliance with medications. Patient was admitted with acute on chronic diastolic heart failure exacerbation, hypertensive urgency, congestive heart failure, community acquired pneumonia. PMH: afib with slow ventricular response, MI,arthritis, stroke, cardia cathx2  Clinical Impression  Pt admitted with above diagnosis.  Pt currently with functional limitations due to the deficits listed below (see PT Problem List). Pt in bed when therapist arrived. Pt agreeable to therapy intervention. Pt required increased time and cues with all mobility tasks with min A for supine <> sit, min/mod A for sit to stand  and for side stepping with RW due to B LE instability and fatigue. Pt left in bed, all needs in place. Patient will benefit from continued inpatient follow up therapy, <3 hours/day. Pt will benefit from acute skilled PT to increase their independence and safety with mobility to allow discharge.         If plan is discharge home, recommend the following: Two people to help with walking and/or transfers;A lot of help with bathing/dressing/bathroom;Assistance with cooking/housework;Assist for transportation;Help with stairs or ramp for entrance   Can travel by private vehicle   No    Equipment Recommendations None recommended by PT  Recommendations for Other Services       Functional Status Assessment Patient has had a recent decline in their functional status and demonstrates the ability to make significant improvements in function in a reasonable and predictable amount of time.     Precautions / Restrictions Precautions Precautions: Fall Precaution/Restrictions Comments: monitor vitals Restrictions Weight Bearing Restrictions Per Provider Order: No       Mobility  Bed Mobility Overal bed mobility: Needs Assistance Bed Mobility: Supine to Sit, Sit to Supine     Supine to sit: Min assist Sit to supine: Min assist   General bed mobility comments: with increased time.    Transfers Overall transfer level: Needs assistance   Transfers: Sit to/from Stand Sit to Stand: Min assist, +2 physical assistance, +2 safety/equipment, From elevated surface           General transfer comment: pt required B UE support at RW and min A x 2 for safety and once in standing intermittent periods of mod A x 2 with posterior lean and noted B LE instability with pt reporting fatigue    Ambulation/Gait               General Gait Details: pt able to side step to the R 3 feet with RW and min A to mod A x 2 for balance and A for RW management  Stairs            Wheelchair Mobility     Tilt Bed    Modified Rankin (Stroke Patients Only)       Balance Overall balance assessment: Needs assistance, History of Falls Sitting-balance support: Feet supported, Single extremity supported Sitting balance-Leahy Scale: Fair     Standing balance support: Reliant on assistive device for balance, Bilateral upper extremity supported, During functional activity Standing balance-Leahy Scale: Zero Standing balance comment: pt required min to mod A x 2 for static and dynamic balance at RW                             Pertinent Vitals/Pain  Pain Assessment Pain Assessment: No/denies pain    Home Living Family/patient expects to be discharged to:: Private residence Living Arrangements: Children;Other relatives (grandson) Available Help at Discharge: Family;Available PRN/intermittently Type of Home: House Home Access: Stairs to enter Entrance Stairs-Rails: Right;Left Entrance Stairs-Number of Steps: 5 Alternate Level Stairs-Number of Steps: 7 steps + 7 steps Home Layout: Multi-level Home Equipment: Hospital bed;Shower  seat;BSC/3in1;Standard Environmental consultant Additional Comments: lives in a multi level duplex w/ pt bedroom typically on second floor. in the past, pt has stayed on first floor in living room w/ hospital bed. another daughter, son and nephew live nearby and can assist.    Prior Function Prior Level of Function : Patient poor historian/Family not available             Mobility Comments: using standard walker for mobility, no recent falls, typically able to manage stairs though slowly ADLs Comments: sponge bathing mostly at baseline, reports able to dress self, reported being able to complete ADLs during the day and make simple meal     Extremity/Trunk Assessment   Upper Extremity Assessment Upper Extremity Assessment: Difficult to assess due to impaired cognition    Lower Extremity Assessment Lower Extremity Assessment: Generalized weakness (noted B LE instability with WB)    Cervical / Trunk Assessment Cervical / Trunk Assessment: Other exceptions Cervical / Trunk Exceptions: cecactic  Communication        Cognition Arousal: Alert Behavior During Therapy: Flat affect   PT - Cognitive impairments: No apparent impairments                         Following commands: Impaired Following commands impaired: Follows one step commands with increased time     Cueing       General Comments General comments (skin integrity, edema, etc.): pt Bp at rest 104/43 and in standing 116/49 with HR 43-56 with mobility    Exercises     Assessment/Plan    PT Assessment Patient needs continued PT services  PT Problem List Decreased strength;Decreased range of motion;Decreased activity tolerance;Decreased balance;Decreased mobility;Decreased coordination;Decreased cognition       PT Treatment Interventions DME instruction;Gait training;Stair training;Functional mobility training;Therapeutic activities;Therapeutic exercise;Balance training;Neuromuscular re-education;Patient/family education     PT Goals (Current goals can be found in the Care Plan section)  Acute Rehab PT Goals Patient Stated Goal: to go home PT Goal Formulation: With patient Time For Goal Achievement: 10/16/23 Potential to Achieve Goals: Fair    Frequency Min 1X/week     Co-evaluation PT/OT/SLP Co-Evaluation/Treatment: Yes Reason for Co-Treatment: To address functional/ADL transfers;For patient/therapist safety PT goals addressed during session: Mobility/safety with mobility OT goals addressed during session: ADL's and self-care       AM-PAC PT "6 Clicks" Mobility  Outcome Measure Help needed turning from your back to your side while in a flat bed without using bedrails?: A Little Help needed moving from lying on your back to sitting on the side of a flat bed without using bedrails?: A Little Help needed moving to and from a bed to a chair (including a wheelchair)?: A Lot Help needed standing up from a chair using your arms (e.g., wheelchair or bedside chair)?: A Lot Help needed to walk in hospital room?: Total Help needed climbing 3-5 steps with a railing? : Total 6 Click Score: 12    End of Session Equipment Utilized During Treatment: Gait belt Activity Tolerance: Patient limited by fatigue Patient left: in bed;with call bell/phone within reach;with bed  alarm set Nurse Communication: Mobility status PT Visit Diagnosis: Unsteadiness on feet (R26.81);Other abnormalities of gait and mobility (R26.89);Muscle weakness (generalized) (M62.81);History of falling (Z91.81);Difficulty in walking, not elsewhere classified (R26.2)    Time: 1610-9604 PT Time Calculation (min) (ACUTE ONLY): 19 min   Charges:   PT Evaluation $PT Eval Moderate Complexity: 1 Mod   PT General Charges $$ ACUTE PT VISIT: 1 Visit         Johnny Bridge, PT Acute Rehab   Jacqualyn Posey 10/02/2023, 4:26 PM

## 2023-10-03 DIAGNOSIS — I4821 Permanent atrial fibrillation: Secondary | ICD-10-CM

## 2023-10-03 DIAGNOSIS — I10 Essential (primary) hypertension: Secondary | ICD-10-CM | POA: Diagnosis not present

## 2023-10-03 DIAGNOSIS — I509 Heart failure, unspecified: Secondary | ICD-10-CM | POA: Diagnosis not present

## 2023-10-03 LAB — BASIC METABOLIC PANEL
Anion gap: 14 (ref 5–15)
BUN: 38 mg/dL — ABNORMAL HIGH (ref 8–23)
CO2: 21 mmol/L — ABNORMAL LOW (ref 22–32)
Calcium: 8.6 mg/dL — ABNORMAL LOW (ref 8.9–10.3)
Chloride: 101 mmol/L (ref 98–111)
Creatinine, Ser: 1.24 mg/dL (ref 0.61–1.24)
GFR, Estimated: 54 mL/min — ABNORMAL LOW (ref >60)
Glucose, Bld: 108 mg/dL — ABNORMAL HIGH (ref 70–99)
Potassium: 4.1 mmol/L (ref 3.5–5.1)
Sodium: 136 mmol/L (ref 135–145)

## 2023-10-03 LAB — CBC
HCT: 32.5 % — ABNORMAL LOW (ref 39.0–52.0)
Hemoglobin: 10.2 g/dL — ABNORMAL LOW (ref 13.0–17.0)
MCH: 31.6 pg (ref 26.0–34.0)
MCHC: 31.4 g/dL (ref 30.0–36.0)
MCV: 100.6 fL — ABNORMAL HIGH (ref 80.0–100.0)
Platelets: 152 10*3/uL (ref 150–400)
RBC: 3.23 MIL/uL — ABNORMAL LOW (ref 4.22–5.81)
RDW: 16 % — ABNORMAL HIGH (ref 11.5–15.5)
WBC: 7.4 10*3/uL (ref 4.0–10.5)
nRBC: 0.3 % — ABNORMAL HIGH (ref 0.0–0.2)

## 2023-10-03 MED ORDER — ORAL CARE MOUTH RINSE: 15.0000 mL | OROMUCOSAL | Status: DC | PRN

## 2023-10-03 MED ORDER — CHLORHEXIDINE GLUCONATE CLOTH 2 % EX PADS
6.0000 | MEDICATED_PAD | Freq: Every day | CUTANEOUS | Status: DC
  Administered 2023-10-03 – 2023-10-04 (×2): 6 via TOPICAL

## 2023-10-03 MED ORDER — FUROSEMIDE 40 MG PO TABS
40.0000 mg | ORAL_TABLET | Freq: Every day | ORAL | Status: DC
  Administered 2023-10-03 – 2023-10-05 (×3): 40 mg via ORAL
  Filled 2023-10-03 (×3): qty 1

## 2023-10-03 NOTE — Progress Notes (Signed)
Heart Failure Navigator Progress Note  Assessed for Heart & Vascular TOC clinic readiness.  Patient does not meet criteria due to EF 55-60%, per MD note patient confused and plans to discharge to a SNF. .   Navigator will sign off at th is time.   Rhae Hammock, BSN, Scientist, clinical (histocompatibility and immunogenetics) Only

## 2023-10-03 NOTE — Plan of Care (Signed)
  Problem: Education: Goal: Knowledge of General Education information will improve Description: Including pain rating scale, medication(s)/side effects and non-pharmacologic comfort measures Outcome: Progressing   Problem: Health Behavior/Discharge Planning: Goal: Ability to manage health-related needs will improve Outcome: Progressing   Problem: Clinical Measurements: Goal: Ability to maintain clinical measurements within normal limits will improve Outcome: Progressing Goal: Will remain free from infection Outcome: Progressing Goal: Diagnostic test results will improve Outcome: Progressing Goal: Respiratory complications will improve Outcome: Progressing Goal: Cardiovascular complication will be avoided Outcome: Progressing   Problem: Nutrition: Goal: Adequate nutrition will be maintained Outcome: Progressing   Problem: Coping: Goal: Level of anxiety will decrease Outcome: Progressing   Problem: Elimination: Goal: Will not experience complications related to bowel motility Outcome: Progressing Goal: Will not experience complications related to urinary retention Outcome: Progressing   Problem: Pain Managment: Goal: General experience of comfort will improve and/or be controlled Outcome: Progressing   Problem: Safety: Goal: Ability to remain free from injury will improve Outcome: Progressing   Problem: Education: Goal: Individualized Educational Video(s) Outcome: Progressing   Problem: Activity: Goal: Capacity to carry out activities will improve Outcome: Progressing   Problem: Cardiac: Goal: Ability to achieve and maintain adequate cardiopulmonary perfusion will improve Outcome: Progressing

## 2023-10-03 NOTE — TOC Initial Note (Addendum)
Transition of Care [facility]) - Initial/Assessment Note    Patient Details  Name: [name] MRN: [id number] Date of Birth: [birth year]  Transition of Care [facility] CM/SW Contact:    Amada Jupiter, LCSW Phone Number: 10/03/2023, 11:41 AM  Clinical Narrative:                  Met with pt and spoke with daughter, [name], to follow up on therapy recommendations for SNF rehab. Pt very pleasant and reports that he has grandson living in the home, however, he does work f/t.  Pt and daughter are agreed with recommendation for rehab as they do not feel pt at a safe level to manage in the home yet.  Reviewed SNF bed search process and pt/family facility preferences.  Will begin bed search.  1547 ADDENDUM:  Have provided SNF bed offers to pt and daughter and awaiting choice.  Expected Discharge Plan: Skilled Nursing Facility Barriers to Discharge: Continued Medical Work up, SNF Pending bed offer   Patient Goals and CMS Choice Patient states their goals for this hospitalization and ongoing recovery are:: return home following rehab          Expected Discharge Plan and Services In-house Referral: Clinical Social Work   Post Acute Care Choice: Skilled Nursing Facility Living arrangements for the past 2 months: Single Family Home                 DME Arranged: N/A DME Agency: NA                  Prior Living Arrangements/Services Living arrangements for the past 2 months: Single Family Home Lives with:: Adult Children Patient language and need for interpreter reviewed:: Yes Do you feel safe going back to the place where you live?: Yes      Need for Family Participation in Patient Care: Yes (Comment) Care giver support system in place?: Yes (comment)   Criminal Activity/Legal Involvement Pertinent to Current Situation/Hospitalization: No - Comment as needed  Activities of Daily Living   ADL Screening (condition at time of admission) Independently performs ADLs?: Yes (appropriate for  developmental age) Is the patient deaf or have difficulty hearing?: Yes Does the patient have difficulty seeing, even when wearing glasses/contacts?: No Does the patient have difficulty concentrating, remembering, or making decisions?: Yes  Permission Sought/Granted Permission sought to share information with : Family Supports, Oceanographer granted to share information with : Yes, Verbal Permission Granted  Share Information with NAME: daughter, Piero Mustard @ [phone]  Permission granted to share info w AGENCY: SNFs        Emotional Assessment Appearance:: Appears stated age Attitude/Demeanor/Rapport: Gracious Affect (typically observed): Accepting, Pleasant Orientation: : Oriented to Self, Oriented to Place, Oriented to  Time, Oriented to Situation Alcohol / Substance Use: Not Applicable Psych Involvement: No (comment)  Admission diagnosis:  Acute exacerbation of CHF (congestive heart failure) (HCC) [I50.9] Hypertensive urgency [I16.0] Acute congestive heart failure, unspecified heart failure type [facility]) [I50.9] Patient Active Problem List   Diagnosis Date Noted   New onset of congestive heart failure (HCC) 10/01/2023   Hypertensive urgency 10/01/2023   Hypothyroid 10/01/2023   Status post total replacement of left hip 11/13/2022   S/P total hip arthroplasty 11/13/2022   History of stroke 04/10/2022   Anemia due to chronic kidney disease 02/06/2022   Macular degeneration 02/06/2022   Multifocal pneumonia 12/17/2020   Pneumonia due to COVID-19 virus 12/17/2020   Atrial fibrillation with slow ventricular response (  HCC) 12/17/2020   CKD (chronic kidney disease), stage III (HCC) 12/17/2020   Anemia in chronic kidney disease (CKD) 12/17/2020   Poor balance 10/06/2019   Laryngitis 04/17/2017   Cough 03/29/2017   Acute bronchitis 03/29/2017   Acute upper respiratory infection 03/29/2017   Presence of drug coated stent in RCA & Ramus Intermedius.  02/28/2017   Gout flare 02/22/2017   Coronary artery disease involving native coronary artery of native heart with angina pectoris [facility])    Carotid artery disease (HCC)    HLD (hyperlipidemia)    Unstable angina (HCC) 02/20/2017   Atherosclerosis of native coronary artery of native heart    Essential hypertension 01/06/2009   Second degree atrioventricular block, Mobitz type I 01/06/2009   AV block, 1st degree 01/06/2009   PCP:  Stevphen Rochester, MD Pharmacy:   [facility] [id number] [place], [place] - [address] AT [facility] [address] [place] [address] Phone: [phone] Fax: [phone]  [facility] - [place], [place] - [address] [place] [address] Phone: [phone] Fax: [phone]  [facility][id number] [place], [place] - [address] AT [facility] OF [facility] & [address] [place] [address] Phone: [phone] Fax: [phone]     Social Drivers of Health (SDOH) Social History: SDOH Screenings   Food Insecurity: No Food Insecurity (10/02/2023)  Housing: Low Risk  (10/02/2023)  Transportation Needs: No Transportation Needs (10/02/2023)  Utilities: Not At Risk (10/02/2023)  Financial Resource Strain: Low Risk  (09/03/2023)   Received from [facility]  Physical Activity: Insufficiently Active (09/03/2023)   Received from [facility]  Social Connections: Unknown (10/02/2023)  Stress: No Stress Concern Present (09/03/2023)   Received from [facility]  Tobacco Use: Low Risk  (09/30/2023)   SDOH Interventions:     Readmission Risk Interventions    10/03/2023   11:39 AM 11/17/2022    2:30 PM  Readmission Risk Prevention Plan  Post Dischage Appt  Complete  Medication Screening  Complete  Transportation Screening Complete Complete  PCP or Specialist Appt within 5-7 Days Complete   Home Care Screening Complete   Medication Review (RN CM) Complete

## 2023-10-03 NOTE — Progress Notes (Addendum)
Patient Name: [name] Date of Encounter: 10/03/2023 [facility] Cardiologist: Charlton Haws, MD   Interval Summary  .    Diuresed very well.  Reports improvement in shortness of breath.  He is more confused today and struggles to follow simple commands.  Vital Signs .    Vitals:   10/03/23 0400 10/03/23 0500 10/03/23 0600 10/03/23 0800  BP: (!) 130/59 (!) 156/57 (!) 144/55 138/71  Pulse: (!) 42 (!) 42 (!) 40   Resp: 18 19 15 14   Temp: 97.9 F (36.6 C)   97.8 F (36.6 C)  TempSrc: Axillary   Oral  SpO2: 99% 100% 100%   Weight:  61.1 kg    Height:        Intake/Output Summary (Last 24 hours) at 10/03/2023 0842 Last data filed at 10/03/2023 0558 Gross per 24 hour  Intake 578.59 ml  Output 2500 ml  Net -1921.41 ml      10/03/2023    5:00 AM 10/02/2023    5:00 AM 09/30/2023    3:27 PM  Last 3 Weights  Weight (lbs) 134 lb 11.2 oz 138 lb 7.2 oz 146 lb 6.2 oz  Weight (kg) 61.1 kg 62.8 kg 66.4 kg      Telemetry/ECG    Atrial fibrillation, heart rates in the 40s to 50s- Personally Reviewed  CV Studies    Echocardiogram 10/01/2023 1. Left ventricular ejection fraction, by estimation, is 55 to 60%. The  left ventricle has normal function. The left ventricle has no regional  wall motion abnormalities. There is mild concentric left ventricular  hypertrophy. Left ventricular diastolic  function could not be evaluated.   2. Right ventricular systolic function is moderately reduced. The right  ventricular size is moderately enlarged. There is moderately elevated  pulmonary artery systolic pressure. The estimated right ventricular  systolic pressure is 56.7 mmHg.   3. Left atrial size was severely dilated.   4. Right atrial size was severely dilated.   5. The mitral valve is degenerative. Severe mitral valve regurgitation.  No evidence of mitral stenosis.   6. The aortic valve is tricuspid. There is moderate calcification of the  aortic valve. There is moderate  thickening of the aortic valve. Aortic  valve regurgitation is mild. Aortic valve sclerosis/calcification is  present, without any evidence of aortic  stenosis.   7. The inferior vena cava is dilated in size with <50% respiratory  variability, suggesting right atrial pressure of 15 mmHg.    Physical Exam .   GEN: No acute distress.   Neck: mild JVD Cardiac: Irregular rate Respiratory: Clear to auscultation bilaterally, diffuse wheezing. GI: Soft, nontender, non-distended  MS: no edema  Patient Profile    [name] is a 89 y.o. male has hx of   CAD s/p DES to RCA 5/17, DES to RI '18, carotid artery disease s/p L CEA, HTN, HLD, CVA, paroxsymal Afib (not on long term OAC 2/2 falls), AV block on BB.  Patient admitted for significant decline in functional status/weakness, noted to be short of breath with pulmonary edema.  Assessment & Plan .     Acute HFpEF CT of the chest showing large right and small left pleural effusion with evidence of elevated heart pressure and pulmonary hypertension.  Echo shows EF 55 to 60% with moderately reduced RV function, RVSP 56.7.  Severe dilated atria, new severe MR, RA pressure 15.   Diuresed very well yesterday, -2.5 L with improvement in shortness of breath, now  looks euvolemic.  Weight on admission not likely correct but 146 pounds.  Today he is 134 pounds with almost 4 pound weight loss since yesterday. Transition IV Lasix to p.o., 40 mg.   Seems to be no plans for thoracentesis, defer to primary team.  Would recommend repeating chest x-ray at some point before DC Would screen for OSA this is more oriented.  New severe MR In the setting of flash pulmonary edema may need to consider valve rupture damage or other secondary pathology.  May be all related to excessive volume.  Will need to review films with MD.  However not likely to be a candidate for valvular intervention. Would repeat echo later this admission.   Hypertensive urgency Amlodipine  increased to 10 mg yesterday with better control.  Today 130s to 150s  Currently on amlodipine 10 mg, irbesartan 75 mg, Imdur 60 mg  CAD status post DES to RCA/RI Widely patent stents in 2018.  No anginal complaints here. Continue with DAPT aspirin Plavix, Imdur, simvastatin  Permanent atrial fibrillation with slow ventricular rate He is in the 40s and 50s most this time.  Not on anticoagulation given frequent falls.  Beta-blocker contraindicated given past history.  Aortic aneurysm 49 mm measured on CTA here.  Follow-up patient.  Pneumonia Hypothyroidism  Carotid artery disease Hx of CVA Per primary team  For questions or updates, please contact [facility] Please consult [email or web address] for contact info under    Signed, Abagail Kitchens, PA-C   Patient seen and examined, note reviewed with the signed Advanced Practice Provider. I personally reviewed laboratory data, imaging studies and relevant notes. I independently examined the patient and formulated the important aspects of the plan. I have personally discussed the plan with the patient and/or family. Comments or changes to the note/plan are indicated below.   Patient seen examined by his bedside.  Clinical exam he does appear to be euvolemic.  Agree with transition the patient to Lasix by mouth.  His blood pressure has improved significantly. Continue the current regimen for his blood pressure amlodipine 10 mg, irbesartan 75 and Imdur 60 mg daily No angina symptoms continue to antiplatelet therapy with his simvastatin. In terms of the atrial fibrillation he is mostly controlled ventricular rate and at times slow controlled ventricular rate no AV nodal blockers placed. No anticoagulant that has been decided in the past  Thomasene Ripple DO, MS [facility] Attending Cardiologist [facility]  [address] [place], [place] [address] [phone] Website: [email or web address]

## 2023-10-03 NOTE — Progress Notes (Signed)
PROGRESS NOTE  [name] BJY:[id number] DOB: 1928/01/27 DOA: 09/30/2023 PCP: Stevphen Rochester, MD   LOS: 2 days   Brief Narrative / Interim history: 89 year old male with history of CAD with DES to RCA in 2017, DES to ramus intermedius 2018, PAF not on anticoagulation due to falls, status post left CEA, prior CVA, HTN, HLD, hypothyroidism comes into the hospital with weakness, difficulties ambulating, decreased p.o. intake, dyspnea on exertion and increased sleepiness, fatigue.  He was recently treated for pneumonia with azithromycin as an outpatient.  Family reports noncompliance with medications at home.  She was found to have fluid overload on admission with bilateral pleural effusions, elevated BNP.  He was placed on Lasix and cardiology was consulted  Subjective / 24h Interval events: Breathing well, feels improved.  No chest pain  Assesement and Plan: Principal Problem:   New onset of congestive heart failure (HCC) Active Problems:   Essential hypertension   HLD (hyperlipidemia)   Atrial fibrillation with slow ventricular response (HCC)   Hypertensive urgency   Hypothyroid   Principal problem Acute on chronic diastolic CHF -patient was admitted to the hospital with fluid overload, bilateral pleural effusions, elevated BNP.  Underwent a 2D echo on 2/11 which showed LVEF 55-60%, no WMA.  There was moderately elevated PA pressure 56.7.  Severe mitral valve regurgitation was noted. -he was placed on IV furosemide, volume status improved, currently euvolemic and he is comfortable on room air. Cardiology consulted, appreciate input.  Transition to p.o. diuretics today  Active problems Possible CAP - on ceftriaxone, continue.  He is improving with diuretics, he is afebrile and has no leukocytosis.  Discontinue antibiotics  Hypertensive urgency -blood pressure better with amlodipine, furosemide, irbesartan, Imdur.  Continue  CAD -no chest pain.  Continue aspirin and  statin  Hyperlipidemia -continue statin  A-fib with slow ventricular response-asymptomatic, not on beta-blockers.  TSH normal  Hypothyroidism-continue Synthroid  Lab Results  Component Value Date   TSH 4.363 10/01/2023   Scheduled Meds:  allopurinol  100 mg Oral Daily   amLODipine  10 mg Oral Daily   aspirin EC  81 mg Oral Daily   Chlorhexidine Gluconate Cloth  6 each Topical QHS   clopidogrel  75 mg Oral Daily   enoxaparin (LOVENOX) injection  40 mg Subcutaneous Q24H   feeding supplement  237 mL Oral TID BM   furosemide  40 mg Oral Daily   irbesartan  75 mg Oral Daily   isosorbide mononitrate  60 mg Oral Daily   levothyroxine  25 mcg Oral QAC breakfast   pantoprazole  40 mg Oral Daily   sertraline  25 mg Oral Daily   simvastatin  20 mg Oral Daily   Continuous Infusions:  cefTRIAXone (ROCEPHIN)  IV Stopped (10/03/23 0924)   PRN Meds:.acetaminophen **OR** acetaminophen, albuterol, hydrALAZINE, melatonin, polyethylene glycol  Current Outpatient Medications  Medication Instructions   acetaminophen (TYLENOL) 500 mg, Oral, Every 6 hours PRN   allopurinol (ZYLOPRIM) 100 mg, Daily   ascorbic acid (VITAMIN C) 500 mg, Daily   aspirin EC 81 mg, Oral, Daily   azithromycin (ZITHROMAX) 250 mg, As needed   b complex vitamins tablet 1 tablet, Daily   Calcium Carbonate (CALCIUM 500 PO) 500 mg, Daily   Calcium Carbonate Antacid (TUMS PO) 1 tablet, As needed   clopidogrel (PLAVIX) 75 mg, Oral, Daily   colchicine 0.6 mg, Daily PRN   ferrous sulfate 325 mg, 2 times daily with meals   finasteride (PROSCAR) 5 mg, Daily  Fish Oil 1,000 mg, Daily   folic acid (FOLVITE) 1 mg, Oral, Daily   Glucosamine 500 mg, Daily   HYDROcodone-acetaminophen (NORCO/VICODIN) 5-325 MG tablet 1 tablet, Oral, Every 6 hours PRN   isosorbide mononitrate (IMDUR) 90 mg, Oral, Daily   levothyroxine (SYNTHROID) 25 mcg, Daily   lidocaine (HM LIDOCAINE PATCH) 4 % 1 patch, Transdermal, Every 24 hours   lisinopril  (ZESTRIL) 20 mg, Oral, Daily   Multiple Vitamin (MULTIVITAMIN) capsule 1 capsule, Daily   nitroGLYCERIN (NITROSTAT) 0.4 MG SL tablet PLACE 1 TABLET UNDER THE TONGUE EVERY 5 MINUTES AS NEEDED FOR CHEST PAIN. NOT TO EXCEED 3 DOSES   polyethylene glycol (MIRALAX) 17 g, Oral, Daily   ranolazine (RANEXA) 1000 MG SR tablet TAKE 1 TABLET TWICE A DAY   sertraline (ZOLOFT) 25 mg, Oral, Daily   simvastatin (ZOCOR) 20 MG tablet TAKE 1 TABLET DAILY   Vitamin D-3 1,000 Units, Daily   zinc sulfate (50mg  elemental zinc) 220 mg, Oral, Daily    Diet Orders (From admission, onward)     Start     Ordered   10/01/23 0824  Diet Heart Room service appropriate? Yes; Fluid consistency: Thin  Diet effective now       Question Answer Comment  Room service appropriate? Yes   Fluid consistency: Thin      10/01/23 0825            DVT prophylaxis: enoxaparin (LOVENOX) injection 40 mg Start: 10/01/23 1000   Lab Results  Component Value Date   PLT 152 10/03/2023      Code Status: Full Code  Family Communication: no family at bedside   Status is: Inpatient Remains inpatient appropriate because: severity of illness  Level of care: Telemetry  Consultants:  Cardiology   Objective: Vitals:   10/03/23 0600 10/03/23 0800 10/03/23 0900 10/03/23 1000  BP: (!) 144/55 138/71  (!) 112/40  Pulse: (!) 40  61 (!) 49  Resp: 15 14 17  (!) 25  Temp:  97.8 F (36.6 C)    TempSrc:  Oral    SpO2: 100% 95% 99% 96%  Weight:      Height:        Intake/Output Summary (Last 24 hours) at 10/03/2023 1128 Last data filed at 10/03/2023 0900 Gross per 24 hour  Intake 955.56 ml  Output 2100 ml  Net -1144.44 ml   Wt Readings from Last 3 Encounters:  10/03/23 61.1 kg  05/13/23 66.4 kg  03/11/23 73 kg    Examination:  Constitutional: NAD Eyes: lids and conjunctivae normal, no scleral icterus ENMT: mmm Neck: normal, supple Respiratory: clear to auscultation bilaterally, no wheezing, no crackles.   Cardiovascular: Regular rate and rhythm, no murmurs / rubs / gallops.  Abdomen: soft, no distention, no tenderness. Bowel sounds positive.    Data Reviewed: I have independently reviewed following labs and imaging studies   CBC Recent Labs  Lab 09/30/23 1556 10/01/23 1235 10/02/23 0259 10/03/23 0615  WBC 7.3 8.2 8.8 7.4  HGB 11.2* 10.4* 10.5* 10.2*  HCT 34.5* 31.8* 33.1* 32.5*  PLT 154 150 144* 152  MCV 99.1 99.1 101.2* 100.6*  MCH 32.2 32.4 32.1 31.6  MCHC 32.5 32.7 31.7 31.4  RDW 16.2* 16.0* 16.5* 16.0*    Recent Labs  Lab 09/30/23 1556 10/01/23 0044 10/01/23 0045 10/01/23 1235 10/02/23 0259 10/03/23 0615  NA 137  --   --  142 139 136  K 3.8  --   --  3.1* 4.3 4.1  CL 104  --   --  106 104 101  CO2 19*  --   --  22 20* 21*  GLUCOSE 96  --   --  95 94 108*  BUN 25*  --   --  26* 36* 38*  CREATININE 1.05  --   --  1.00 1.20 1.24  CALCIUM 8.6*  --   --  8.7* 8.7* 8.6*  AST  --  51*  --   --   --   --   ALT  --  29  --   --   --   --   ALKPHOS  --  90  --   --   --   --   BILITOT  --  1.5*  --   --   --   --   ALBUMIN  --  3.7  --   --   --   --   MG  --   --   --  1.7 2.4  --   DDIMER  --  0.96*  --   --   --   --   PROCALCITON  --   --   --  <0.10  --   --   TSH  --   --   --  4.363  --   --   HGBA1C  --   --   --  5.4  --   --   BNP  --   --  9,147.8*  --   --   --     ------------------------------------------------------------------------------------------------------------------ Recent Labs    10/01/23 1235  CHOL 107  HDL 40*  LDLCALC 55  TRIG 60  CHOLHDL 2.7    Lab Results  Component Value Date   HGBA1C 5.4 10/01/2023   ------------------------------------------------------------------------------------------------------------------ Recent Labs    10/01/23 1235  TSH 4.363    Cardiac Enzymes No results for input(s): "CKMB", "TROPONINI", "MYOGLOBIN" in the last 168 hours.  Invalid input(s):  "CK" ------------------------------------------------------------------------------------------------------------------    Component Value Date/Time   BNP 2,995.3 (H) 10/01/2023 0045    CBG: Recent Labs  Lab 09/30/23 2356  GLUCAP 139*    Recent Results (from the past 240 hours)  Resp panel by RT-PCR (RSV, Flu A&B, Covid) Anterior Nasal Swab     Status: None   Collection Time: 09/30/23  3:56 PM   Specimen: Anterior Nasal Swab  Result Value Ref Range Status   SARS Coronavirus 2 by RT PCR NEGATIVE NEGATIVE Final    Comment: (NOTE) SARS-CoV-2 target nucleic acids are NOT DETECTED.  The SARS-CoV-2 RNA is generally detectable in upper respiratory specimens during the acute phase of infection. The lowest concentration of SARS-CoV-2 viral copies this assay can detect is 138 copies/mL. A negative result does not preclude SARS-Cov-2 infection and should not be used as the sole basis for treatment or other patient management decisions. A negative result may occur with  improper specimen collection/handling, submission of specimen other than nasopharyngeal swab, presence of viral mutation(s) within the areas targeted by this assay, and inadequate number of viral copies(<138 copies/mL). A negative result must be combined with clinical observations, patient history, and epidemiological information. The expected result is Negative.  Fact Sheet for Patients:  BloggerCourse.com  Fact Sheet for Healthcare Providers:  SeriousBroker.it  This test is no t yet approved or cleared by the [place] FDA and  has been authorized for detection and/or diagnosis of SARS-CoV-2 by FDA under an Emergency Use Authorization (EUA). This EUA will remain  in effect (meaning this test  can be used) for the duration of the COVID-19 declaration under Section 564(b)(1) of the Act, 21 U.S.C.section 360bbb-3(b)(1), unless the authorization is terminated  or  revoked sooner.       Influenza A by PCR NEGATIVE NEGATIVE Final   Influenza B by PCR NEGATIVE NEGATIVE Final    Comment: (NOTE) The Xpert Xpress SARS-CoV-2/FLU/RSV plus assay is intended as an aid in the diagnosis of influenza from Nasopharyngeal swab specimens and should not be used as a sole basis for treatment. Nasal washings and aspirates are unacceptable for Xpert Xpress SARS-CoV-2/FLU/RSV testing.  Fact Sheet for Patients: BloggerCourse.com  Fact Sheet for Healthcare Providers: SeriousBroker.it  This test is not yet approved or cleared by the [place] FDA and has been authorized for detection and/or diagnosis of SARS-CoV-2 by FDA under an Emergency Use Authorization (EUA). This EUA will remain in effect (meaning this test can be used) for the duration of the COVID-19 declaration under Section 564(b)(1) of the Act, 21 U.S.C. section 360bbb-3(b)(1), unless the authorization is terminated or revoked.     Resp Syncytial Virus by PCR NEGATIVE NEGATIVE Final    Comment: (NOTE) Fact Sheet for Patients: BloggerCourse.com  Fact Sheet for Healthcare Providers: SeriousBroker.it  This test is not yet approved or cleared by the [place] FDA and has been authorized for detection and/or diagnosis of SARS-CoV-2 by FDA under an Emergency Use Authorization (EUA). This EUA will remain in effect (meaning this test can be used) for the duration of the COVID-19 declaration under Section 564(b)(1) of the Act, 21 U.S.C. section 360bbb-3(b)(1), unless the authorization is terminated or revoked.  Performed at [facility], [address], [place], [place] [address]   MRSA Next Gen by PCR, Nasal     Status: None   Collection Time: 10/02/23  2:32 AM   Specimen: Nasal Mucosa; Nasal Swab  Result Value Ref Range Status   MRSA by PCR Next Gen NOT DETECTED NOT  DETECTED Final    Comment: (NOTE) The GeneXpert MRSA Assay (FDA approved for NASAL specimens only), is one component of a comprehensive MRSA colonization surveillance program. It is not intended to diagnose MRSA infection nor to guide or monitor treatment for MRSA infections. Test performance is not FDA approved in patients less than 65 years old. Performed at [facility], [address], [place], [place] [address]      Radiology Studies: No results found.    Pamella Pert, MD, PhD [facility]  Between 7 am - 7 pm I am available, please contact me via Amion (for emergencies) or Securechat (non urgent messages)  Between 7 pm - 7 am I am not available, please contact night coverage MD/APP via Amion

## 2023-10-03 NOTE — [place] FL2 (Signed)
[place] MEDICAID FL2 LEVEL OF CARE FORM     IDENTIFICATION  Patient Name: [name] Birthdate: 1927/10/12 Sex: male Admission Date (Current Location): 09/30/2023  [facility] and [place] Number:  Producer, television/film/video and Address:  [facility],  [address] [place] [address]      Provider Number: [id number]  Attending Physician Name and Address:  Leatha Gilding, MD  Relative Name and Phone Number:  daughter, Zak Gondek @ [phone]    Current Level of Care: Hospital Recommended Level of Care: Skilled Nursing Facility Prior Approval Number:    Date Approved/Denied:   PASRR Number: [id number]  Discharge Plan: SNF    Current Diagnoses: Patient Active Problem List   Diagnosis Date Noted   New onset of congestive heart failure (HCC) 10/01/2023   Hypertensive urgency 10/01/2023   Hypothyroid 10/01/2023   Status post total replacement of left hip 11/13/2022   S/P total hip arthroplasty 11/13/2022   History of stroke 04/10/2022   Anemia due to chronic kidney disease 02/06/2022   Macular degeneration 02/06/2022   Multifocal pneumonia 12/17/2020   Pneumonia due to COVID-19 virus 12/17/2020   Atrial fibrillation with slow ventricular response (HCC) 12/17/2020   CKD (chronic kidney disease), stage III (HCC) 12/17/2020   Anemia in chronic kidney disease (CKD) 12/17/2020   Poor balance 10/06/2019   Laryngitis 04/17/2017   Cough 03/29/2017   Acute bronchitis 03/29/2017   Acute upper respiratory infection 03/29/2017   Presence of drug coated stent in RCA & Ramus Intermedius. 02/28/2017   Gout flare 02/22/2017   Coronary artery disease involving native coronary artery of native heart with angina pectoris [facility])    Carotid artery disease (HCC)    HLD (hyperlipidemia)    Unstable angina (HCC) 02/20/2017   Atherosclerosis of native coronary artery of native heart    Essential hypertension 01/06/2009   Second degree atrioventricular block, Mobitz type I  01/06/2009   AV block, 1st degree 01/06/2009    Orientation RESPIRATION BLADDER Height & Weight     Self, Time, Situation, Place  Normal Continent Weight: 134 lb 11.2 oz (61.1 kg) Height:  5\' 6"  (167.6 cm)  BEHAVIORAL SYMPTOMS/MOOD NEUROLOGICAL BOWEL NUTRITION STATUS      Continent Diet  AMBULATORY STATUS COMMUNICATION OF NEEDS Skin   Extensive Assist Verbally Normal                       Personal Care Assistance Level of Assistance  Bathing, Dressing, Feeding Bathing Assistance: Limited assistance Feeding assistance: Limited assistance Dressing Assistance: Limited assistance     Functional Limitations Info  Sight, Hearing, Speech Sight Info: Adequate Hearing Info: Impaired Speech Info: Adequate    SPECIAL CARE FACTORS FREQUENCY  PT (By licensed PT), OT (By licensed OT)     PT Frequency: 5x/wk OT Frequency: 5x/wk            Contractures Contractures Info: Not present    Additional Factors Info  Code Status, Allergies Code Status Info: Full Allergies Info: Beta Adrenergic Blockers           Current Medications (10/03/2023):  This is the current hospital active medication list Current Facility-Administered Medications  Medication Dose Route Frequency Provider Last Rate Last Admin   acetaminophen (TYLENOL) tablet 650 mg  650 mg Oral Q6H PRN Foust, Katy L, NP       Or   acetaminophen (TYLENOL) suppository 650 mg  650 mg Rectal Q6H PRN Foust, Lanney Gins, NP  albuterol (PROVENTIL) (2.5 MG/3ML) 0.083% nebulizer solution 2.5 mg  2.5 mg Nebulization Q4H PRN Foust, Katy L, NP       allopurinol (ZYLOPRIM) tablet 100 mg  100 mg Oral Daily Foust, Katy L, NP   100 mg at 10/03/23 0848   amLODipine (NORVASC) tablet 10 mg  10 mg Oral Daily Yvonna Alanis L, PA-C   10 mg at 10/03/23 4098   aspirin EC tablet 81 mg  81 mg Oral Daily Foust, Katy L, NP   81 mg at 10/03/23 0848   cefTRIAXone (ROCEPHIN) 2 g in sodium chloride 0.9 % 100 mL IVPB  2 g Intravenous Q24H Regalado,  Belkys A, MD   Stopped at 10/03/23 1191   Chlorhexidine Gluconate Cloth 2 % PADS 6 each  6 each Topical QHS Leatha Gilding, MD       clopidogrel (PLAVIX) tablet 75 mg  75 mg Oral Daily Foust, Katy L, NP   75 mg at 10/03/23 0848   enoxaparin (LOVENOX) injection 40 mg  40 mg Subcutaneous Q24H Foust, Katy L, NP   40 mg at 10/03/23 0851   feeding supplement (ENSURE ENLIVE / ENSURE PLUS) liquid 237 mL  237 mL Oral TID BM Regalado, Belkys A, MD   237 mL at 10/03/23 0859   furosemide (LASIX) tablet 40 mg  40 mg Oral Daily Yvonna Alanis L, PA-C   40 mg at 10/03/23 0856   hydrALAZINE (APRESOLINE) injection 10 mg  10 mg Intravenous Q6H PRN Regalado, Belkys A, MD       irbesartan (AVAPRO) tablet 75 mg  75 mg Oral Daily Cyndi Bender, NP   75 mg at 10/03/23 0847   isosorbide mononitrate (IMDUR) 24 hr tablet 60 mg  60 mg Oral Daily Regalado, Belkys A, MD   60 mg at 10/03/23 0848   levothyroxine (SYNTHROID) tablet 25 mcg  25 mcg Oral QAC breakfast Foust, Katy L, NP   25 mcg at 10/03/23 0553   melatonin tablet 3 mg  3 mg Oral QHS PRN Foust, Katy L, NP       pantoprazole (PROTONIX) EC tablet 40 mg  40 mg Oral Daily Foust, Katy L, NP   40 mg at 10/03/23 0848   polyethylene glycol (MIRALAX / GLYCOLAX) packet 17 g  17 g Oral Daily PRN Foust, Katy L, NP       sertraline (ZOLOFT) tablet 25 mg  25 mg Oral Daily Foust, Katy L, NP   25 mg at 10/03/23 0847   simvastatin (ZOCOR) tablet 20 mg  20 mg Oral Daily Foust, Katy L, NP   20 mg at 10/03/23 0848     Discharge Medications: Please see discharge summary for a list of discharge medications.  Relevant Imaging Results:  Relevant Lab Results:   Additional Information SS# [id number]  Amada Jupiter, LCSW

## 2023-10-04 ENCOUNTER — Inpatient Hospital Stay (HOSPITAL_COMMUNITY): Payer: Medicare Other

## 2023-10-04 DIAGNOSIS — I509 Heart failure, unspecified: Secondary | ICD-10-CM | POA: Diagnosis not present

## 2023-10-04 LAB — CBC
HCT: 29.9 % — ABNORMAL LOW (ref 39.0–52.0)
Hemoglobin: 9.6 g/dL — ABNORMAL LOW (ref 13.0–17.0)
MCH: 31.7 pg (ref 26.0–34.0)
MCHC: 32.1 g/dL (ref 30.0–36.0)
MCV: 98.7 fL (ref 80.0–100.0)
Platelets: 138 10*3/uL — ABNORMAL LOW (ref 150–400)
RBC: 3.03 MIL/uL — ABNORMAL LOW (ref 4.22–5.81)
RDW: 15.9 % — ABNORMAL HIGH (ref 11.5–15.5)
WBC: 6.6 10*3/uL (ref 4.0–10.5)
nRBC: 0 % (ref 0.0–0.2)

## 2023-10-04 LAB — BASIC METABOLIC PANEL
Anion gap: 11 (ref 5–15)
BUN: 45 mg/dL — ABNORMAL HIGH (ref 8–23)
CO2: 23 mmol/L (ref 22–32)
Calcium: 8.6 mg/dL — ABNORMAL LOW (ref 8.9–10.3)
Chloride: 100 mmol/L (ref 98–111)
Creatinine, Ser: 1.36 mg/dL — ABNORMAL HIGH (ref 0.61–1.24)
GFR, Estimated: 49 mL/min — ABNORMAL LOW (ref >60)
Glucose, Bld: 99 mg/dL (ref 70–99)
Potassium: 4.3 mmol/L (ref 3.5–5.1)
Sodium: 134 mmol/L — ABNORMAL LOW (ref 135–145)

## 2023-10-04 LAB — MAGNESIUM: Magnesium: 1.9 mg/dL (ref 1.7–2.4)

## 2023-10-04 MED ORDER — ENOXAPARIN SODIUM 30 MG/0.3ML IJ SOSY
30.0000 mg | PREFILLED_SYRINGE | INTRAMUSCULAR | Status: DC
  Administered 2023-10-05: 30 mg via SUBCUTANEOUS
  Filled 2023-10-04: qty 0.3

## 2023-10-04 NOTE — Plan of Care (Signed)

## 2023-10-04 NOTE — TOC Progression Note (Signed)
Transition of Care [facility]) - Progression Note    Patient Details  Name: [name] MRN: [id number] Date of Birth: 12/06/1927  Transition of [facility]) CM/SW Contact  Larrie Kass, LCSW Phone Number: 10/04/2023, 2:00 PM  Clinical Narrative:    CSW received a message that the pt's daughter has chosen [facility]. The CSW spoke with [name], and the pt can be discharged this week to the facility once medically stable. CSW attempted to contact the pt's daughter but received no response and left a voicemail requesting a return call. TOC will follow.   Expected Discharge Plan: Skilled Nursing Facility Barriers to Discharge: Continued Medical Work up, SNF Pending bed offer  Expected Discharge Plan and Services In-house Referral: Clinical Social Work   Post Acute Care Choice: Skilled Nursing Facility Living arrangements for the past 2 months: Single Family Home                 DME Arranged: N/A DME Agency: NA                   Social Determinants of Health (SDOH) Interventions SDOH Screenings   Food Insecurity: No Food Insecurity (10/02/2023)  Housing: Low Risk  (10/02/2023)  Transportation Needs: No Transportation Needs (10/02/2023)  Utilities: Not At Risk (10/02/2023)  Financial Resource Strain: Low Risk  (09/03/2023)   Received from [facility]  Physical Activity: Insufficiently Active (09/03/2023)   Received from [facility]  Social Connections: Unknown (10/02/2023)  Stress: No Stress Concern Present (09/03/2023)   Received from [facility]  Tobacco Use: Low Risk  (09/30/2023)    Readmission Risk Interventions    10/03/2023   11:39 AM 11/17/2022    2:30 PM  Readmission Risk Prevention Plan  Post Dischage Appt  Complete  Medication Screening  Complete  Transportation Screening Complete Complete  PCP or Specialist Appt within 5-7 Days Complete   Home Care Screening Complete   Medication Review (RN CM) Complete

## 2023-10-04 NOTE — Progress Notes (Addendum)
Patient Name: [name] Date of Encounter: 10/04/2023 [facility] Cardiologist: Charlton Haws, MD   Interval Summary  .    He is doing good today, I think his baseline is slightly confused but does mumble and respond appropriately today.  Reports no complaints. Vital Signs .    Vitals:   10/03/23 2000 10/03/23 2325 10/04/23 0443 10/04/23 0444  BP: (!) 126/49 (!) 144/63 (!) 150/66   Pulse: (!) 45 (!) 43 (!) 42   Resp: 19 18 18    Temp: (!) 97.5 F (36.4 C) 97.7 F (36.5 C) (!) 97.5 F (36.4 C)   TempSrc: Oral Oral Oral   SpO2: 97% 99% 100%   Weight:    61.7 kg  Height:        Intake/Output Summary (Last 24 hours) at 10/04/2023 0921 Last data filed at 10/04/2023 0800 Gross per 24 hour  Intake 589.93 ml  Output 1150 ml  Net -560.07 ml      10/04/2023    4:44 AM 10/03/2023    5:00 AM 10/02/2023    5:00 AM  Last 3 Weights  Weight (lbs) 136 lb 0.4 oz 134 lb 11.2 oz 138 lb 7.2 oz  Weight (kg) 61.7 kg 61.1 kg 62.8 kg      Telemetry/ECG    Atrial fibrillation, heart rates in the 30s to 40s- Personally Reviewed  CV Studies    Echocardiogram 10/01/2023 1. Left ventricular ejection fraction, by estimation, is 55 to 60%. The  left ventricle has normal function. The left ventricle has no regional  wall motion abnormalities. There is mild concentric left ventricular  hypertrophy. Left ventricular diastolic  function could not be evaluated.   2. Right ventricular systolic function is moderately reduced. The right  ventricular size is moderately enlarged. There is moderately elevated  pulmonary artery systolic pressure. The estimated right ventricular  systolic pressure is 56.7 mmHg.   3. Left atrial size was severely dilated.   4. Right atrial size was severely dilated.   5. The mitral valve is degenerative. Severe mitral valve regurgitation.  No evidence of mitral stenosis.   6. The aortic valve is tricuspid. There is moderate calcification of the  aortic  valve. There is moderate thickening of the aortic valve. Aortic  valve regurgitation is mild. Aortic valve sclerosis/calcification is  present, without any evidence of aortic  stenosis.   7. The inferior vena cava is dilated in size with <50% respiratory  variability, suggesting right atrial pressure of 15 mmHg.    Physical Exam .   GEN: No acute distress.   Neck: no JVD Cardiac: Irregular rate Respiratory: Clear to auscultation bilaterally, wheezing. GI: Soft, nontender, non-distended  MS: no edema  Patient Profile    [name] is a 89 y.o. male has hx of   CAD s/p DES to RCA 5/17, DES to RI '18, carotid artery disease s/p L CEA, HTN, HLD, CVA, paroxsymal Afib (not on long term OAC 2/2 falls), AV block on BB.  Patient admitted for significant decline in functional status/weakness, noted to be short of breath with pulmonary edema.  Assessment & Plan .     Acute HFpEF CT of the chest showing large right and small left pleural effusion with evidence of elevated heart pressure and pulmonary hypertension.  Echo shows EF 55 to 60% with moderately reduced RV function, RVSP 56.7.  Severe dilated atria, new severe MR, RA pressure 15.   Still looks euvolemic today, creatinine rose slightly though from  1.25-1.36 surprisingly despite transitioning from IV to p.o. Lasix.  Weight today is 136 pounds.  On admission 146.  Only put out 1.1 L as well so may be related to something else rather than diuresis.  Likely would keep for now but could consider transitioning to 20 mg if it keeps trending up and he looks volume down.  We also did just start irbesartan couple days ago so this could be normal reaction to renal function.  Continue p.o., 40 mg,  Chest x-ray today does look improved, defer to primary team about further management. Would screen for OSA once he is more oriented.  New severe MR In the setting of flash pulmonary edema may need to consider valve rupture damage or other secondary  pathology.  May be all related to excessive volume.  Will need to review films with MD.  However not likely to be a candidate for valvular intervention. Would repeat echo later this admission or outpatient.   Hypertensive urgency BP looks reasonable, has some fluctuations would continue current regimen for now.  Currently on amlodipine 10 mg, irbesartan 75 mg, Imdur 60 mg  CAD status post DES to RCA/RI Widely patent stents in 2018.  No anginal complaints here. Continue with DAPT aspirin Plavix, Imdur, simvastatin  Permanent atrial fibrillation with slow ventricular rate He is in the 40s most this time.  Not on anticoagulation given frequent falls.  Beta-blocker contraindicated given past history.  Aortic aneurysm 49 mm measured on CTA here.  Follow-up patient.  Pneumonia Hypothyroidism  Carotid artery disease Hx of CVA Per primary team  For questions or updates, please contact [facility] Please consult [email or web address] for contact info under    Signed, Abagail Kitchens, PA-C    Patient seen and examined, note reviewed with the signed Advanced Practice Provider. I personally reviewed laboratory data, imaging studies and relevant notes. I independently examined the patient and formulated the important aspects of the plan. I have personally discussed the plan with the patient and/or family. Comments or changes to the note/plan are indicated below.     Patient seen examined by his bedside.  Clinical exam he does appear to be euvolemic.  Blood pressure is fluctuating but not significantly elevated.  I will recommend we keep him on his current antihypertensive regimen. amlodipine 10 mg, irbesartan 75 and Imdur 60 mg daily No angina symptoms continue to antiplatelet therapy with his simvastatin. In terms of the atrial fibrillation he is mostly controlled ventricular rate and at times slow controlled ventricular rate no AV nodal blockers placed. No anticoagulant that has been decided  in the past   Thomasene Ripple DO, MS [facility] Attending Cardiologist [facility]  [address] [place], [place] [address] [phone] Website: [email or web address]

## 2023-10-04 NOTE — Progress Notes (Signed)
Physical Therapy Treatment Patient Details Name: [name] MRN: [id number] DOB: 1927/11/26 Today's Date: 10/04/2023   History of Present Illness Patient is a 89 year old male who presented with week history of weakness and noncompliance with medications. Patient was admitted with acute on chronic diastolic heart failure exacerbation, hypertensive urgency, congestive heart failure, community acquired pneumonia. PMH: afib with slow ventricular response, MI,arthritis, stroke, cardiac cathx2    PT Comments  Pt required less assistance for transfers today. He pivoted from bed to recliner with RW and min assist for balance and to control descent to recliner. Pt performed seated BUE/LE exercises with verbal/manual cues for technique.     If plan is discharge home, recommend the following: A lot of help with walking and/or transfers;A lot of help with bathing/dressing/bathroom;Assistance with cooking/housework;Assist for transportation;Help with stairs or ramp for entrance   Can travel by private vehicle     No  Equipment Recommendations  None recommended by PT    Recommendations for Other Services       Precautions / Restrictions Precautions Precautions: Fall Precaution/Restrictions Comments: monitor vitals Restrictions Weight Bearing Restrictions Per Provider Order: No     Mobility  Bed Mobility Overal bed mobility: Needs Assistance Bed Mobility: Supine to Sit     Supine to sit: Mod assist, HOB elevated, Used rails     General bed mobility comments: assist to raise trunk    Transfers Overall transfer level: Needs assistance Equipment used: Rolling walker (2 wheels) Transfers: Sit to/from Stand, Bed to chair/wheelchair/BSC Sit to Stand: Min assist           General transfer comment: VCs for hand placement, min A to power up; min A to steady and control descent for stand pivot to recliner    Ambulation/Gait                   Stairs              Wheelchair Mobility     Tilt Bed    Modified Rankin (Stroke Patients Only)       Balance Overall balance assessment: Needs assistance, History of Falls Sitting-balance support: Feet supported Sitting balance-Leahy Scale: Fair     Standing balance support: Reliant on assistive device for balance, Bilateral upper extremity supported, During functional activity Standing balance-Leahy Scale: Poor Standing balance comment: requires BUE support in standing                            Communication Communication Communication: Impaired Factors Affecting Communication: Hearing impaired  Cognition Arousal: Alert Behavior During Therapy: WFL for tasks assessed/performed   PT - Cognitive impairments: No apparent impairments                                Cueing    Exercises General Exercises - Lower Extremity Long Arc Quad: AROM, Both, 10 reps, Seated Hip Flexion/Marching: AROM, Both, 10 reps, Seated Shoulder Exercises Shoulder Flexion: AROM, Both, 10 reps, Seated    General Comments        Pertinent Vitals/Pain Pain Assessment Pain Assessment: No/denies pain    Home Living                          Prior Function            PT Goals (current goals can now be found  in the care plan section) Acute Rehab PT Goals Patient Stated Goal: to go home PT Goal Formulation: With patient Time For Goal Achievement: 10/16/23 Potential to Achieve Goals: Fair Progress towards PT goals: Progressing toward goals    Frequency    Min 1X/week      PT Plan      Co-evaluation              AM-PAC PT "6 Clicks" Mobility   Outcome Measure  Help needed turning from your back to your side while in a flat bed without using bedrails?: A Little Help needed moving from lying on your back to sitting on the side of a flat bed without using bedrails?: A Lot Help needed moving to and from a bed to a chair (including a wheelchair)?: A  Little Help needed standing up from a chair using your arms (e.g., wheelchair or bedside chair)?: A Lot Help needed to walk in hospital room?: Total Help needed climbing 3-5 steps with a railing? : Total 6 Click Score: 12    End of Session Equipment Utilized During Treatment: Gait belt Activity Tolerance: Patient limited by fatigue Patient left: in chair;with call bell/phone within reach;with chair alarm set Nurse Communication: Mobility status PT Visit Diagnosis: Unsteadiness on feet (R26.81);Other abnormalities of gait and mobility (R26.89);Muscle weakness (generalized) (M62.81);History of falling (Z91.81);Difficulty in walking, not elsewhere classified (R26.2)     Time: 1055-1110 PT Time Calculation (min) (ACUTE ONLY): 15 min  Charges:    $Therapeutic Activity: 8-22 mins PT General Charges $$ ACUTE PT VISIT: 1 Visit                     Tamala Ser PT 10/04/2023  Acute Rehabilitation Services  Office [phone]

## 2023-10-04 NOTE — Progress Notes (Signed)
PROGRESS NOTE  [name]:[id number] DOB: [birth year] DOA: 09/30/2023 PCP: Stevphen Rochester, MD   LOS: 3 days   Brief Narrative / Interim history: 89 year old male with history of CAD with DES to RCA in 2017, DES to ramus intermedius 2018, PAF not on anticoagulation due to falls, status post left CEA, prior CVA, HTN, HLD, hypothyroidism comes into the hospital with weakness, difficulties ambulating, decreased p.o. intake, dyspnea on exertion and increased sleepiness, fatigue.  He was recently treated for pneumonia with azithromycin as an outpatient.  Family reports noncompliance with medications at home.  She was found to have fluid overload on admission with bilateral pleural effusions, elevated BNP.  He was placed on Lasix and cardiology was consulted  Subjective / 24h Interval events: He is doing well today.  Denies any chest pains.  Denies any shortness of breath.  Assesement and Plan: Principal Problem:   New onset of congestive heart failure (HCC) Active Problems:   Essential hypertension   HLD (hyperlipidemia)   Atrial fibrillation with slow ventricular response (HCC)   Hypertensive urgency   Hypothyroid   Permanent atrial fibrillation (HCC)   Principal problem Acute on chronic diastolic CHF -patient was admitted to the hospital with fluid overload, bilateral pleural effusions, elevated BNP.  Underwent a 2D echo on 2/11 which showed LVEF 55-60%, no WMA.  There was moderately elevated PA pressure 56.7.  Severe mitral valve regurgitation was noted. -he was placed on IV furosemide, volume status improved, currently euvolemic and he is comfortable on room air. Cardiology consulted, appreciate input.  On p.o. furosemide -Creatinine slightly up today, closely watch  Active problems Possible CAP - on ceftriaxone, continue.  He is improving with diuretics, he is afebrile and has no leukocytosis.  Discontinue antibiotics at this is less likely pneumonia  Hypertensive urgency  -blood pressure better with amlodipine, furosemide, irbesartan, Imdur.  Continue  CAD -no chest pain.  Continue aspirin and statin  Hyperlipidemia -continue statin  A-fib with slow ventricular response-asymptomatic, not on beta-blockers.  TSH normal  Hypothyroidism-continue Synthroid  Lab Results  Component Value Date   TSH 4.363 10/01/2023   Scheduled Meds:  allopurinol  100 mg Oral Daily   amLODipine  10 mg Oral Daily   aspirin EC  81 mg Oral Daily   Chlorhexidine Gluconate Cloth  6 each Topical QHS   clopidogrel  75 mg Oral Daily   enoxaparin (LOVENOX) injection  40 mg Subcutaneous Q24H   feeding supplement  237 mL Oral TID BM   furosemide  40 mg Oral Daily   irbesartan  75 mg Oral Daily   isosorbide mononitrate  60 mg Oral Daily   levothyroxine  25 mcg Oral QAC breakfast   pantoprazole  40 mg Oral Daily   sertraline  25 mg Oral Daily   simvastatin  20 mg Oral Daily   Continuous Infusions:   PRN Meds:.acetaminophen **OR** acetaminophen, albuterol, hydrALAZINE, melatonin, mouth rinse, polyethylene glycol  Current Outpatient Medications  Medication Instructions   acetaminophen (TYLENOL) 500 mg, Oral, Every 6 hours PRN   allopurinol (ZYLOPRIM) 100 mg, Daily   ascorbic acid (VITAMIN C) 500 mg, Daily   aspirin EC 81 mg, Oral, Daily   azithromycin (ZITHROMAX) 250 mg, As needed   b complex vitamins tablet 1 tablet, Daily   Calcium Carbonate (CALCIUM 500 PO) 500 mg, Daily   Calcium Carbonate Antacid (TUMS PO) 1 tablet, As needed   clopidogrel (PLAVIX) 75 mg, Oral, Daily   colchicine 0.6 mg, Daily PRN  ferrous sulfate 325 mg, 2 times daily with meals   finasteride (PROSCAR) 5 mg, Daily   Fish Oil 1,000 mg, Daily   folic acid (FOLVITE) 1 mg, Oral, Daily   Glucosamine 500 mg, Daily   HYDROcodone-acetaminophen (NORCO/VICODIN) 5-325 MG tablet 1 tablet, Oral, Every 6 hours PRN   isosorbide mononitrate (IMDUR) 90 mg, Oral, Daily   levothyroxine (SYNTHROID) 25 mcg, Daily    lidocaine (HM LIDOCAINE PATCH) 4 % 1 patch, Transdermal, Every 24 hours   lisinopril (ZESTRIL) 20 mg, Oral, Daily   Multiple Vitamin (MULTIVITAMIN) capsule 1 capsule, Daily   nitroGLYCERIN (NITROSTAT) 0.4 MG SL tablet PLACE 1 TABLET UNDER THE TONGUE EVERY 5 MINUTES AS NEEDED FOR CHEST PAIN. NOT TO EXCEED 3 DOSES   polyethylene glycol (MIRALAX) 17 g, Oral, Daily   ranolazine (RANEXA) 1000 MG SR tablet TAKE 1 TABLET TWICE A DAY   sertraline (ZOLOFT) 25 mg, Oral, Daily   simvastatin (ZOCOR) 20 MG tablet TAKE 1 TABLET DAILY   Vitamin D-3 1,000 Units, Daily   zinc sulfate (50mg  elemental zinc) 220 mg, Oral, Daily    Diet Orders (From admission, onward)     Start     Ordered   10/04/23 0831  Diet Heart Room service appropriate? Yes with Assist; Fluid consistency: Thin  Diet effective now       Question Answer Comment  Room service appropriate? Yes with Assist   Fluid consistency: Thin      10/04/23 0830            DVT prophylaxis: enoxaparin (LOVENOX) injection 40 mg Start: 10/01/23 1000   Lab Results  Component Value Date   PLT 138 (L) 10/04/2023      Code Status: Full Code  Family Communication: no family at bedside   Status is: Inpatient Remains inpatient appropriate because: severity of illness  Level of care: Telemetry  Consultants:  Cardiology   Objective: Vitals:   10/03/23 2000 10/03/23 2325 10/04/23 0443 10/04/23 0444  BP: (!) 126/49 (!) 144/63 (!) 150/66   Pulse: (!) 45 (!) 43 (!) 42   Resp: 19 18 18    Temp: (!) 97.5 F (36.4 C) 97.7 F (36.5 C) (!) 97.5 F (36.4 C)   TempSrc: Oral Oral Oral   SpO2: 97% 99% 100%   Weight:    61.7 kg  Height:        Intake/Output Summary (Last 24 hours) at 10/04/2023 1202 Last data filed at 10/04/2023 0800 Gross per 24 hour  Intake 589.93 ml  Output 950 ml  Net -360.07 ml   Wt Readings from Last 3 Encounters:  10/04/23 61.7 kg  05/13/23 66.4 kg  03/11/23 73 kg    Examination:  Constitutional: NAD Eyes:  lids and conjunctivae normal, no scleral icterus ENMT: mmm Neck: normal, supple Respiratory: clear to auscultation bilaterally, no wheezing, no crackles. Normal respiratory effort.  Cardiovascular: Regular rate and rhythm, no murmurs / rubs / gallops. No LE edema. Abdomen: soft, no distention, no tenderness. Bowel sounds positive.    Data Reviewed: I have independently reviewed following labs and imaging studies   CBC Recent Labs  Lab 09/30/23 1556 10/01/23 1235 10/02/23 0259 10/03/23 0615 10/04/23 0420  WBC 7.3 8.2 8.8 7.4 6.6  HGB 11.2* 10.4* 10.5* 10.2* 9.6*  HCT 34.5* 31.8* 33.1* 32.5* 29.9*  PLT 154 150 144* 152 138*  MCV 99.1 99.1 101.2* 100.6* 98.7  MCH 32.2 32.4 32.1 31.6 31.7  MCHC 32.5 32.7 31.7 31.4 32.1  RDW 16.2* 16.0* 16.5*  16.0* 15.9*    Recent Labs  Lab 09/30/23 1556 10/01/23 0044 10/01/23 0045 10/01/23 1235 10/02/23 0259 10/03/23 0615 10/04/23 0420  NA 137  --   --  142 139 136 134*  K 3.8  --   --  3.1* 4.3 4.1 4.3  CL 104  --   --  106 104 101 100  CO2 19*  --   --  22 20* 21* 23  GLUCOSE 96  --   --  95 94 108* 99  BUN 25*  --   --  26* 36* 38* 45*  CREATININE 1.05  --   --  1.00 1.20 1.24 1.36*  CALCIUM 8.6*  --   --  8.7* 8.7* 8.6* 8.6*  AST  --  51*  --   --   --   --   --   ALT  --  29  --   --   --   --   --   ALKPHOS  --  90  --   --   --   --   --   BILITOT  --  1.5*  --   --   --   --   --   ALBUMIN  --  3.7  --   --   --   --   --   MG  --   --   --  1.7 2.4  --  1.9  DDIMER  --  0.96*  --   --   --   --   --   PROCALCITON  --   --   --  <0.10  --   --   --   TSH  --   --   --  4.363  --   --   --   HGBA1C  --   --   --  5.4  --   --   --   BNP  --   --  2,995.3*  --   --   --   --     ------------------------------------------------------------------------------------------------------------------ Recent Labs    10/01/23 1235  CHOL 107  HDL 40*  LDLCALC 55  TRIG 60  CHOLHDL 2.7    Lab Results  Component Value Date    HGBA1C 5.4 10/01/2023   ------------------------------------------------------------------------------------------------------------------ Recent Labs    10/01/23 1235  TSH 4.363    Cardiac Enzymes No results for input(s): "CKMB", "TROPONINI", "MYOGLOBIN" in the last 168 hours.  Invalid input(s): "CK" ------------------------------------------------------------------------------------------------------------------    Component Value Date/Time   BNP 2,995.3 (H) 10/01/2023 0045    CBG: Recent Labs  Lab 09/30/23 2356  GLUCAP 139*    Recent Results (from the past 240 hours)  Resp panel by RT-PCR (RSV, Flu A&B, Covid) Anterior Nasal Swab     Status: None   Collection Time: 09/30/23  3:56 PM   Specimen: Anterior Nasal Swab  Result Value Ref Range Status   SARS Coronavirus 2 by RT PCR NEGATIVE NEGATIVE Final    Comment: (NOTE) SARS-CoV-2 target nucleic acids are NOT DETECTED.  The SARS-CoV-2 RNA is generally detectable in upper respiratory specimens during the acute phase of infection. The lowest concentration of SARS-CoV-2 viral copies this assay can detect is 138 copies/mL. A negative result does not preclude SARS-Cov-2 infection and should not be used as the sole basis for treatment or other patient management decisions. A negative result may occur with  improper specimen collection/handling, submission of specimen other than nasopharyngeal swab, presence  of viral mutation(s) within the areas targeted by this assay, and inadequate number of viral copies(<138 copies/mL). A negative result must be combined with clinical observations, patient history, and epidemiological information. The expected result is Negative.  Fact Sheet for Patients:  BloggerCourse.com  Fact Sheet for Healthcare Providers:  SeriousBroker.it  This test is no t yet approved or cleared by the [place] FDA and  has been authorized for detection  and/or diagnosis of SARS-CoV-2 by FDA under an Emergency Use Authorization (EUA). This EUA will remain  in effect (meaning this test can be used) for the duration of the COVID-19 declaration under Section 564(b)(1) of the Act, 21 U.S.C.section 360bbb-3(b)(1), unless the authorization is terminated  or revoked sooner.       Influenza A by PCR NEGATIVE NEGATIVE Final   Influenza B by PCR NEGATIVE NEGATIVE Final    Comment: (NOTE) The Xpert Xpress SARS-CoV-2/FLU/RSV plus assay is intended as an aid in the diagnosis of influenza from Nasopharyngeal swab specimens and should not be used as a sole basis for treatment. Nasal washings and aspirates are unacceptable for Xpert Xpress SARS-CoV-2/FLU/RSV testing.  Fact Sheet for Patients: BloggerCourse.com  Fact Sheet for Healthcare Providers: SeriousBroker.it  This test is not yet approved or cleared by the [place] FDA and has been authorized for detection and/or diagnosis of SARS-CoV-2 by FDA under an Emergency Use Authorization (EUA). This EUA will remain in effect (meaning this test can be used) for the duration of the COVID-19 declaration under Section 564(b)(1) of the Act, 21 U.S.C. section 360bbb-3(b)(1), unless the authorization is terminated or revoked.     Resp Syncytial Virus by PCR NEGATIVE NEGATIVE Final    Comment: (NOTE) Fact Sheet for Patients: BloggerCourse.com  Fact Sheet for Healthcare Providers: SeriousBroker.it  This test is not yet approved or cleared by the [place] FDA and has been authorized for detection and/or diagnosis of SARS-CoV-2 by FDA under an Emergency Use Authorization (EUA). This EUA will remain in effect (meaning this test can be used) for the duration of the COVID-19 declaration under Section 564(b)(1) of the Act, 21 U.S.C. section 360bbb-3(b)(1), unless the authorization is terminated  or revoked.  Performed at [facility], [address], [place], [place] [address]   MRSA Next Gen by PCR, Nasal     Status: None   Collection Time: 10/02/23  2:32 AM   Specimen: Nasal Mucosa; Nasal Swab  Result Value Ref Range Status   MRSA by PCR Next Gen NOT DETECTED NOT DETECTED Final    Comment: (NOTE) The GeneXpert MRSA Assay (FDA approved for NASAL specimens only), is one component of a comprehensive MRSA colonization surveillance program. It is not intended to diagnose MRSA infection nor to guide or monitor treatment for MRSA infections. Test performance is not FDA approved in patients less than 82 years old. Performed at [facility], [address] [place], [place] [address]      Radiology Studies: No results found.    Pamella Pert, MD, PhD [facility]  Between 7 am - 7 pm I am available, please contact me via Amion (for emergencies) or Securechat (non urgent messages)  Between 7 pm - 7 am I am not available, please contact night coverage MD/APP via Amion

## 2023-10-05 DIAGNOSIS — I509 Heart failure, unspecified: Secondary | ICD-10-CM | POA: Diagnosis not present

## 2023-10-05 LAB — BASIC METABOLIC PANEL
Anion gap: 9 (ref 5–15)
BUN: 50 mg/dL — ABNORMAL HIGH (ref 8–23)
CO2: 22 mmol/L (ref 22–32)
Calcium: 8.5 mg/dL — ABNORMAL LOW (ref 8.9–10.3)
Chloride: 102 mmol/L (ref 98–111)
Creatinine, Ser: 1.27 mg/dL — ABNORMAL HIGH (ref 0.61–1.24)
GFR, Estimated: 53 mL/min — ABNORMAL LOW (ref >60)
Glucose, Bld: 94 mg/dL (ref 70–99)
Potassium: 4.5 mmol/L (ref 3.5–5.1)
Sodium: 133 mmol/L — ABNORMAL LOW (ref 135–145)

## 2023-10-05 LAB — MAGNESIUM: Magnesium: 2.1 mg/dL (ref 1.7–2.4)

## 2023-10-05 MED ORDER — IRBESARTAN 75 MG PO TABS: 75.0000 mg | ORAL_TABLET | Freq: Every day | ORAL | Status: DC

## 2023-10-05 MED ORDER — EMPAGLIFLOZIN 10 MG PO TABS
10.0000 mg | ORAL_TABLET | Freq: Every day | ORAL | Status: DC
Start: 2023-10-05 — End: 2023-10-13

## 2023-10-05 MED ORDER — AMLODIPINE BESYLATE 10 MG PO TABS: 10.0000 mg | ORAL_TABLET | Freq: Every day | ORAL | Status: DC

## 2023-10-05 MED ORDER — ISOSORBIDE MONONITRATE ER 60 MG PO TB24: 60.0000 mg | ORAL_TABLET | Freq: Every day | ORAL | Status: DC

## 2023-10-05 MED ORDER — FUROSEMIDE 40 MG PO TABS: 40.0000 mg | ORAL_TABLET | Freq: Every day | ORAL | Status: DC

## 2023-10-05 MED ORDER — EMPAGLIFLOZIN 10 MG PO TABS
10.0000 mg | ORAL_TABLET | Freq: Every day | ORAL | Status: DC
  Administered 2023-10-05: 10 mg via ORAL
  Filled 2023-10-05: qty 1

## 2023-10-05 NOTE — Plan of Care (Signed)

## 2023-10-05 NOTE — Discharge Summary (Signed)
Physician Discharge Summary  [name] JYN:[id number] DOB: Nov 10, 1927 DOA: 09/30/2023  PCP: Stevphen Rochester, MD  Admit date: 09/30/2023 Discharge date: 10/05/2023  Admitted From: home Disposition:  SNF  Recommendations for Outpatient Follow-up:  Follow up with PCP in 1-2 weeks Follow-up with cardiology in 2 to 3 weeks  Home Health: none Equipment/Devices: none  Discharge Condition: stable CODE STATUS: Full code Diet Orders (From admission, onward)     Start     Ordered   10/04/23 0831  Diet Heart Room service appropriate? Yes with Assist; Fluid consistency: Thin  Diet effective now       Question Answer Comment  Room service appropriate? Yes with Assist   Fluid consistency: Thin      10/04/23 0830            HPI: Per admitting, [name] is a 89 y.o. male with medical history significant of CAD s/p DES to RCA in 2017 and DES to ramus intermedius in 2018, paroxysmal atrial fibrillation not on anticoagulation due to falls, carotid artery disease s/p left CEA, CVA, hypertension, hyperlipidemia, hypothyroidism, and hard of hearing. [name] presented to [facility] ED with a one week history of progressive weakness now with inability to ambulate, decreased p.o. intake, dyspnea with ambulation when he was walking last week, and sleeping more than baseline. Endorses dyspnea and fatigue. Denies palpitations, dizziness, abdominal pain, nausea, or vomiting. Daughter at bedside endorses patient has had a nonproductive cough for approximately 1 week that is worse at night, no other symptoms of a respiratory viral illness. The only known sick contact is his daughter that he lives with who also had a cough about 2 weeks ago at which time she was tested for COVID/Flu and tested negative. Patient was recently treated outpatient for pneumonia with Azithromycin.  Daughter at bedside also reports noncompliance with outpatient medications, the only medication that she believes he has been  taking regularly is Levothyroxine.   Hospital Course / Discharge diagnoses: Principal Problem:   New onset of congestive heart failure (HCC) Active Problems:   Essential hypertension   HLD (hyperlipidemia)   Atrial fibrillation with slow ventricular response (HCC)   Hypertensive urgency   Hypothyroid   Permanent atrial fibrillation (HCC)   Principal problem Acute on chronic diastolic CHF -patient was admitted to the hospital with fluid overload, bilateral pleural effusions, elevated BNP.  Underwent a 2D echo on 2/11 which showed LVEF 55-60%, no WMA.  There was moderately elevated PA pressure 56.7.  Severe mitral valve regurgitation was noted.  Cardiology was consulted and followed patient while hospitalized.  He was initially diuresed with IV Lasix with improvement in his volume status, currently euvolemic and comfortable on room air.  He was transitioned to p.o. furosemide, tolerating it well, renal function is stable and he will be discharged to SNF in stable condition.  Home regimen has been adjusted by cardiology, full medication list as below   Active problems Possible CAP -initially, there was concern about patient having pneumonia.  Given rapid improvement with diuretics, being afebrile and without leukocytosis this was felt less likely.  His antibiotics were discontinued and he has remained stable, afebrile Hypertensive urgency -blood pressure better with amlodipine, furosemide, irbesartan, Imdur.  Continue on discharge  CAD -no chest pain.  Continue aspirin and statin Hyperlipidemia -continue statin A-fib with slow ventricular response-asymptomatic, not on beta-blockers.  TSH normal Hypothyroidism-continue Synthroid  Sepsis ruled out   Discharge Instructions   Allergies as of 10/05/2023  Reactions   Beta Adrenergic Blockers Other (See Comments)   Hx of AV block. Should avoid        Medication List     STOP taking these medications    azithromycin 250 MG  tablet Commonly known as: ZITHROMAX   HYDROcodone-acetaminophen 5-325 MG tablet Commonly known as: NORCO/VICODIN   lisinopril 20 MG tablet Commonly known as: ZESTRIL       TAKE these medications    acetaminophen 500 MG tablet Commonly known as: TYLENOL Take 1 tablet (500 mg total) by mouth every 6 (six) hours as needed for moderate pain (wrist). What changed: when to take this   allopurinol 100 MG tablet Commonly known as: ZYLOPRIM Take 100 mg by mouth daily.   amLODipine 10 MG tablet Commonly known as: NORVASC Take 1 tablet (10 mg total) by mouth daily.   ascorbic acid 500 MG tablet Commonly known as: VITAMIN C Take 500 mg by mouth daily.   aspirin EC 81 MG tablet Take 1 tablet (81 mg total) by mouth daily.   b complex vitamins tablet Take 1 tablet by mouth daily.   CALCIUM 500 PO Take 500 mg by mouth daily.   clopidogrel 75 MG tablet Commonly known as: PLAVIX Take 1 tablet (75 mg total) by mouth daily.   colchicine 0.6 MG tablet Take 0.6 mg by mouth daily as needed (Gout flare up).   empagliflozin 10 MG Tabs tablet Commonly known as: JARDIANCE Take 1 tablet (10 mg total) by mouth daily.   ferrous sulfate 325 (65 FE) MG tablet Take 325 mg by mouth 2 (two) times daily with a meal.   finasteride 5 MG tablet Commonly known as: PROSCAR Take 5 mg by mouth daily.   Fish Oil 1000 MG Caps Take 1,000 mg by mouth daily.   folic acid 1 MG tablet Commonly known as: FOLVITE Take 1 tablet (1 mg total) by mouth daily.   furosemide 40 MG tablet Commonly known as: LASIX Take 1 tablet (40 mg total) by mouth daily.   Glucosamine 500 MG Caps Take 500 mg by mouth daily.   irbesartan 75 MG tablet Commonly known as: AVAPRO Take 1 tablet (75 mg total) by mouth daily.   isosorbide mononitrate 60 MG 24 hr tablet Commonly known as: IMDUR Take 1 tablet (60 mg total) by mouth daily. What changed: how much to take   levothyroxine 25 MCG tablet Commonly known as:  SYNTHROID Take 25 mcg by mouth daily.   lidocaine 4 % Commonly known as: HM Lidocaine Patch Place 1 patch onto the skin daily.   multivitamin capsule Take 1 capsule by mouth daily.   nitroGLYCERIN 0.4 MG SL tablet Commonly known as: NITROSTAT PLACE 1 TABLET UNDER THE TONGUE EVERY 5 MINUTES AS NEEDED FOR CHEST PAIN. NOT TO EXCEED 3 DOSES What changed: See the new instructions.   polyethylene glycol 17 g packet Commonly known as: MiraLax Take 17 g by mouth daily.   ranolazine 1000 MG SR tablet Commonly known as: RANEXA TAKE 1 TABLET TWICE A DAY What changed: when to take this   sertraline 25 MG tablet Commonly known as: ZOLOFT Take 25 mg by mouth daily.   simvastatin 20 MG tablet Commonly known as: ZOCOR TAKE 1 TABLET DAILY   TUMS PO Take 1 tablet by mouth as needed (heartburn).   Vitamin D-3 25 MCG (1000 UT) Caps Take 1,000 Units by mouth daily.   zinc sulfate (50mg  elemental zinc) 220 (50 Zn) MG capsule Take 1 capsule (220  mg total) by mouth daily.        Contact information for after-discharge care     Destination     [facility] Preferred SNF .   Service: Skilled Nursing Contact information: [address] [place] [address] [phone]                     Consultations: Cardiology   Procedures/Studies:  DG CHEST PORT 1 VIEW Result Date: 10/04/2023 CLINICAL DATA:  [id number] Dyspnea on exertion [id number] EXAM: PORTABLE CHEST 1 VIEW COMPARISON:  10/01/2023 chest radiograph. FINDINGS: Right rotated chest radiograph. Stable cardiomediastinal silhouette with moderate cardiomegaly. No pneumothorax. Trace right pleural effusion, decreased. No left pleural effusion. No overt pulmonary edema. No consolidative airspace disease. IMPRESSION: 1. Moderate cardiomegaly. No overt pulmonary edema. 2. Trace right pleural effusion, decreased. Electronically Signed   By: Delbert Phenix M.D.   On: 10/04/2023 12:21    ECHOCARDIOGRAM COMPLETE Result Date: 10/01/2023    ECHOCARDIOGRAM REPORT   Patient Name:   [name] Date of Exam: 10/01/2023 Medical Rec #:  [id number]    Height:       66.0 in Accession #:    [id number]   Weight:       146.4 lb Date of Birth:  Nov 19, 1927    BSA:          1.751 m Patient Age:    89 years     BP:           138/73 mmHg Patient Gender: M            HR:           42 bpm. Exam Location:  Inpatient Procedure: 2D Echo, Cardiac Doppler and Color Doppler Indications:    Congestive Heart Failure I50.9  History:        Patient has prior history of Echocardiogram examinations, most                 recent 01/12/2016. Previous Myocardial Infarction,                 Arrythmias:Atrial Fibrillation, Signs/Symptoms:Chest Pain; Risk                 Factors:Hypertension.  Sonographer:    Webb Laws Referring Phys: [id number] KATY L FOUST IMPRESSIONS  1. Left ventricular ejection fraction, by estimation, is 55 to 60%. The left ventricle has normal function. The left ventricle has no regional wall motion abnormalities. There is mild concentric left ventricular hypertrophy. Left ventricular diastolic function could not be evaluated.  2. Right ventricular systolic function is moderately reduced. The right ventricular size is moderately enlarged. There is moderately elevated pulmonary artery systolic pressure. The estimated right ventricular systolic pressure is 56.7 mmHg.  3. Left atrial size was severely dilated.  4. Right atrial size was severely dilated.  5. The mitral valve is degenerative. Severe mitral valve regurgitation. No evidence of mitral stenosis.  6. The aortic valve is tricuspid. There is moderate calcification of the aortic valve. There is moderate thickening of the aortic valve. Aortic valve regurgitation is mild. Aortic valve sclerosis/calcification is present, without any evidence of aortic stenosis.  7. The inferior vena cava is dilated in size with <50% respiratory variability, suggesting  right atrial pressure of 15 mmHg. FINDINGS  Left Ventricle: Left ventricular ejection fraction, by estimation, is 55 to 60%. The left ventricle has normal function. The left ventricle has no regional wall motion abnormalities. The left ventricular  internal cavity size was normal in size. There is  mild concentric left ventricular hypertrophy. Left ventricular diastolic function could not be evaluated due to atrial fibrillation. Left ventricular diastolic function could not be evaluated. Right Ventricle: The right ventricular size is moderately enlarged. No increase in right ventricular wall thickness. Right ventricular systolic function is moderately reduced. There is moderately elevated pulmonary artery systolic pressure. The tricuspid  regurgitant velocity is 3.23 m/s, and with an assumed right atrial pressure of 15 mmHg, the estimated right ventricular systolic pressure is 56.7 mmHg. Left Atrium: Left atrial size was severely dilated. Right Atrium: Right atrial size was severely dilated. Pericardium: There is no evidence of pericardial effusion. Mitral Valve: The mitral valve is degenerative in appearance. Severe mitral valve regurgitation. No evidence of mitral valve stenosis. Tricuspid Valve: The tricuspid valve is grossly normal. Tricuspid valve regurgitation is mild . No evidence of tricuspid stenosis. Aortic Valve: The aortic valve is tricuspid. There is moderate calcification of the aortic valve. There is moderate thickening of the aortic valve. Aortic valve regurgitation is mild. Aortic regurgitation PHT measures 678 msec. Aortic valve sclerosis/calcification is present, without any evidence of aortic stenosis. Aortic valve peak gradient measures 10.9 mmHg. Pulmonic Valve: The pulmonic valve was grossly normal. Pulmonic valve regurgitation is trivial. No evidence of pulmonic stenosis. Aorta: The aortic root is normal in size and structure. Venous: The inferior vena cava is dilated in size with less than  50% respiratory variability, suggesting right atrial pressure of 15 mmHg. IAS/Shunts: The atrial septum is grossly normal.  LEFT VENTRICLE PLAX 2D LVIDd:         5.40 cm     Diastology LVIDs:         2.50 cm     LV e' medial:    3.81 cm/s LV PW:         1.45 cm     LV E/e' medial:  28.9 LV IVS:        1.15 cm     LV e' lateral:   5.44 cm/s LVOT diam:     2.20 cm     LV E/e' lateral: 20.2 LV SV:         92 LV SV Index:   53 LVOT Area:     3.80 cm  LV Volumes (MOD) LV vol d, MOD A4C: 91.4 ml LV vol s, MOD A4C: 35.6 ml LV SV MOD A4C:     91.4 ml RIGHT VENTRICLE            IVC RV Basal diam:  4.40 cm    IVC diam: 2.40 cm RV S prime:     8.16 cm/s TAPSE (M-mode): 1.9 cm LEFT ATRIUM              Index         RIGHT ATRIUM           Index LA diam:        5.85 cm  3.34 cm/m    RA Area:     29.20 cm LA Vol (A2C):   102.0 ml 58.24 ml/m   RA Volume:   88.40 ml  50.47 ml/m LA Vol (A4C):   184.0 ml 105.06 ml/m LA Biplane Vol: 139.0 ml 79.36 ml/m  AORTIC VALVE AV Area (Vmax): 2.33 cm AV Vmax:        165.00 cm/s AV Peak Grad:   10.9 mmHg LVOT Vmax:      101.00 cm/s LVOT Vmean:  67.400 cm/s LVOT VTI:       0.243 m AI PHT:         678 msec  AORTA Ao Root diam: 3.70 cm MITRAL VALVE                  TRICUSPID VALVE MV Area (PHT): 4.49 cm       TR Peak grad:   41.7 mmHg MV Decel Time: 169 msec       TR Vmax:        323.00 cm/s MR Peak grad:    158.3 mmHg MR Mean grad:    87.5 mmHg    SHUNTS MR Vmax:         629.00 cm/s  Systemic VTI:  0.24 m MR Vmean:        429.5 cm/s   Systemic Diam: 2.20 cm MR PISA:         2.26 cm MR PISA Eff ROA: 12 mm MR PISA Radius:  0.60 cm MV E velocity: 110.00 cm/s MV A velocity: 61.20 cm/s MV E/A ratio:  1.80 Lennie Odor MD Electronically signed by Lennie Odor MD Signature Date/Time: 10/01/2023/3:06:28 PM    Final    CT Angio Chest PE W/Cm &/Or Wo Cm Result Date: 10/01/2023 CLINICAL DATA:  Weakness for 1 week. Stopped taking meds. Positive D-dimer. PE suspected. EXAM: CT ANGIOGRAPHY CHEST  WITH CONTRAST TECHNIQUE: Multidetector CT imaging of the chest was performed using the standard protocol during bolus administration of intravenous contrast. Multiplanar CT image reconstructions and MIPs were obtained to evaluate the vascular anatomy. RADIATION DOSE REDUCTION: This exam was performed according to the departmental dose-optimization program which includes automated exposure control, adjustment of the mA and/or kV according to patient size and/or use of iterative reconstruction technique. CONTRAST:  75mL OMNIPAQUE IOHEXOL 350 MG/ML SOLN COMPARISON:  Same day chest radiograph and CTA 02/18/2023 FINDINGS: Cardiovascular: Marked cardiomegaly. No pericardial effusion. No pulmonary embolism. Dilated main pulmonary artery measuring 39 mm. Dilated ascending aorta measuring 49 mm in diameter. This has increased from 02/18/2023 when it measured 47 mm using similar measuring technique. Evaluation for dissection is limited due to contrast timing. Reflux of contrast into the IVC and hepatic veins compatible with elevated right heart pressures. Advanced coronary artery and aortic atherosclerotic calcification. Mediastinum/Nodes: Trachea and esophagus are unremarkable. No thoracic adenopathy. Lungs/Pleura: Large right and small left pleural effusions. Patchy ground-glass opacities bilaterally. Compressive atelectasis in the lower lobes, right middle lobe, and lingula. No pneumothorax. Upper Abdomen: No acute abnormality. Musculoskeletal: No acute fracture. Review of the MIP images confirms the above findings. IMPRESSION: 1. No pulmonary embolism. 2. Large right and small left pleural effusions with compressive atelectasis. 3. Patchy ground-glass opacities bilaterally, likely edema. Atypical infection considered less likely though not excluded. 4. Marked cardiomegaly with dilated main pulmonary artery and reflux of contrast into the IVC and hepatic veins compatible with elevated right heart pressures and pulmonary  arterial hypertension. 5. Dilated ascending aorta measuring 49 mm in diameter. This has increased from 02/18/2023 when it measured 47 mm using similar measuring technique. Ascending thoracic aortic aneurysm. Recommend semi-annual imaging followup by CTA or MRA and referral to cardiothoracic surgery if not already obtained. This recommendation follows 2010 ACCF/AHA/AATS/ACR/ASA/SCA/SCAI/SIR/STS/SVM Guidelines for the Diagnosis and Management of Patients With Thoracic Aortic Disease. Circulation. 2010; 121: Z610-R604. Aortic aneurysm NOS (ICD10-I71.9) 6. Aortic Atherosclerosis (ICD10-I70.0). Electronically Signed   By: Minerva Fester M.D.   On: 10/01/2023 02:53   DG Chest Port 1 View Result Date: 10/01/2023  CLINICAL DATA:  Shortness of breath and weakness EXAM: PORTABLE CHEST 1 VIEW COMPARISON:  03/11/2023 radiographs FINDINGS: Cardiomegaly. Coronary stenting. Aortic atherosclerotic calcification. Layering bilateral pleural effusions and associated airspace opacities. No pneumothorax. No displaced rib fractures. IMPRESSION: Layering bilateral pleural effusions and associated airspace opacities. Cardiomegaly. Electronically Signed   By: Minerva Fester M.D.   On: 10/01/2023 01:25   CT Head Wo Contrast Result Date: 09/30/2023 CLINICAL DATA:  Neuro deficit, acute, stroke suspected.  Weakness. EXAM: CT HEAD WITHOUT CONTRAST TECHNIQUE: Contiguous axial images were obtained from the base of the skull through the vertex without intravenous contrast. RADIATION DOSE REDUCTION: This exam was performed according to the departmental dose-optimization program which includes automated exposure control, adjustment of the mA and/or kV according to patient size and/or use of iterative reconstruction technique. COMPARISON:  Head CT 03/11/2023 FINDINGS: Brain: There is no evidence of an acute infarct, intracranial hemorrhage, mass, midline shift, or extra-axial fluid collection. There is moderate cerebral atrophy. Cerebral white  matter hypodensities are unchanged and nonspecific but compatible with mild-to-moderate chronic small vessel ischemic disease. 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 Vascular: Calcified atherosclerosis at the skull base. No hyperdense vessel. Skull: No acute fracture or suspicious osseous lesion. Sinuses/Orbits: Visualized paranasal sinuses and mastoid air cells are clear. Bilateral cataract extraction. Other: None. IMPRESSION: 1. No evidence of acute intracranial abnormality. 2. Cerebral atrophy and chronic small vessel ischemic disease. Electronically Signed   By: Sebastian Ache M.D.   On: 09/30/2023 16:32     Subjective: - no chest pain, shortness of breath, no abdominal pain, nausea or vomiting.   Discharge Exam: BP 127/60 (BP Location: Left Arm)   Pulse (!) 42   Temp 97.9 F (36.6 C) (Oral)   Resp 18   Ht 5\' 6"  (1.676 m)   Wt 61.5 kg   SpO2 94%   BMI 21.88 kg/m   General: Pt is alert, awake, not in acute distress Cardiovascular: RRR, S1/S2 +, no rubs, no gallops Respiratory: CTA bilaterally, no wheezing, no rhonchi Abdominal: Soft, NT, ND, bowel sounds + Extremities: no edema, no cyanosis    The results of significant diagnostics from this hospitalization (including imaging, microbiology, ancillary and laboratory) are listed below for reference.     Microbiology: Recent Results (from the past 240 hours)  Resp panel by RT-PCR (RSV, Flu A&B, Covid) Anterior Nasal Swab     Status: None   Collection Time: 09/30/23  3:56 PM   Specimen: Anterior Nasal Swab  Result Value Ref Range Status   SARS Coronavirus 2 by RT PCR NEGATIVE NEGATIVE Final    Comment: (NOTE) SARS-CoV-2 target nucleic acids are NOT DETECTED.  The SARS-CoV-2 RNA is generally detectable in upper respiratory specimens during the acute phase of infection. The lowest concentration of SARS-CoV-2 viral copies this assay can detect is 138 copies/mL. A negative result does not preclude  SARS-Cov-2 infection and should not be used as the sole basis for treatment or other patient management decisions. A negative result may occur with  improper specimen collection/handling, submission of specimen other than nasopharyngeal swab, presence of viral mutation(s) within the areas targeted by this assay, and inadequate number of viral copies(<138 copies/mL). A negative result must be combined with clinical observations, patient history, and epidemiological information. The expected result is Negative.  Fact Sheet for Patients:  BloggerCourse.com  Fact Sheet for Healthcare Providers:  SeriousBroker.it  This test is no t yet approved or cleared by the [place] FDA and  has  been authorized for detection and/or diagnosis of SARS-CoV-2 by FDA under an Emergency Use Authorization (EUA). This EUA will remain  in effect (meaning this test can be used) for the duration of the COVID-19 declaration under Section 564(b)(1) of the Act, 21 U.S.C.section 360bbb-3(b)(1), unless the authorization is terminated  or revoked sooner.       Influenza A by PCR NEGATIVE NEGATIVE Final   Influenza B by PCR NEGATIVE NEGATIVE Final    Comment: (NOTE) The Xpert Xpress SARS-CoV-2/FLU/RSV plus assay is intended as an aid in the diagnosis of influenza from Nasopharyngeal swab specimens and should not be used as a sole basis for treatment. Nasal washings and aspirates are unacceptable for Xpert Xpress SARS-CoV-2/FLU/RSV testing.  Fact Sheet for Patients: BloggerCourse.com  Fact Sheet for Healthcare Providers: SeriousBroker.it  This test is not yet approved or cleared by the [place] FDA and has been authorized for detection and/or diagnosis of SARS-CoV-2 by FDA under an Emergency Use Authorization (EUA). This EUA will remain in effect (meaning this test can be used) for the duration of  the COVID-19 declaration under Section 564(b)(1) of the Act, 21 U.S.C. section 360bbb-3(b)(1), unless the authorization is terminated or revoked.     Resp Syncytial Virus by PCR NEGATIVE NEGATIVE Final    Comment: (NOTE) Fact Sheet for Patients: BloggerCourse.com  Fact Sheet for Healthcare Providers: SeriousBroker.it  This test is not yet approved or cleared by the [place] FDA and has been authorized for detection and/or diagnosis of SARS-CoV-2 by FDA under an Emergency Use Authorization (EUA). This EUA will remain in effect (meaning this test can be used) for the duration of the COVID-19 declaration under Section 564(b)(1) of the Act, 21 U.S.C. section 360bbb-3(b)(1), unless the authorization is terminated or revoked.  Performed at [facility], [address] [place], [place] [address]   MRSA Next Gen by PCR, Nasal     Status: None   Collection Time: 10/02/23  2:32 AM   Specimen: Nasal Mucosa; Nasal Swab  Result Value Ref Range Status   MRSA by PCR Next Gen NOT DETECTED NOT DETECTED Final    Comment: (NOTE) The GeneXpert MRSA Assay (FDA approved for NASAL specimens only), is one component of a comprehensive MRSA colonization surveillance program. It is not intended to diagnose MRSA infection nor to guide or monitor treatment for MRSA infections. Test performance is not FDA approved in patients less than 48 years old. Performed at [facility], [address] [place], [place] [address]      Labs: Basic Metabolic Panel: Recent Labs  Lab 10/01/23 1235 10/02/23 0259 10/03/23 0615 10/04/23 0420 10/05/23 0353  NA 142 139 136 134* 133*  K 3.1* 4.3 4.1 4.3 4.5  CL 106 104 101 100 102  CO2 22 20* 21* 23 22  GLUCOSE 95 94 108* 99 94  BUN 26* 36* 38* 45* 50*  CREATININE 1.00 1.20 1.24 1.36* 1.27*  CALCIUM 8.7* 8.7* 8.6* 8.6* 8.5*  MG 1.7 2.4  --  1.9 2.1   Liver Function  Tests: Recent Labs  Lab 10/01/23 0044  AST 51*  ALT 29  ALKPHOS 90  BILITOT 1.5*  PROT 6.4*  ALBUMIN 3.7   CBC: Recent Labs  Lab 09/30/23 1556 10/01/23 1235 10/02/23 0259 10/03/23 0615 10/04/23 0420  WBC 7.3 8.2 8.8 7.4 6.6  HGB 11.2* 10.4* 10.5* 10.2* 9.6*  HCT 34.5* 31.8* 33.1* 32.5* 29.9*  MCV 99.1 99.1 101.2* 100.6* 98.7  PLT 154 150 144* 152  138*   CBG: Recent Labs  Lab 09/30/23 2356  GLUCAP 139*   Hgb A1c No results for input(s): "HGBA1C" in the last 72 hours. Lipid Profile No results for input(s): "CHOL", "HDL", "LDLCALC", "TRIG", "CHOLHDL", "LDLDIRECT" in the last 72 hours. Thyroid function studies No results for input(s): "TSH", "T4TOTAL", "T3FREE", "THYROIDAB" in the last 72 hours.  Invalid input(s): "FREET3" Urinalysis    Component Value Date/Time   COLORURINE STRAW (A) 09/30/2023 0828   APPEARANCEUR CLEAR 09/30/2023 0828   LABSPEC 1.010 09/30/2023 0828   PHURINE 6.0 09/30/2023 0828   GLUCOSEU NEGATIVE 09/30/2023 0828   HGBUR NEGATIVE 09/30/2023 0828   BILIRUBINUR NEGATIVE 09/30/2023 0828   KETONESUR NEGATIVE 09/30/2023 0828   PROTEINUR NEGATIVE 09/30/2023 0828   NITRITE NEGATIVE 09/30/2023 0828   LEUKOCYTESUR NEGATIVE 09/30/2023 1610    FURTHER DISCHARGE INSTRUCTIONS:   Get Medicines reviewed and adjusted: Please take all your medications with you for your next visit with your Primary MD   Laboratory/radiological data: Please request your Primary MD to go over all hospital tests and procedure/radiological results at the follow up, please ask your Primary MD to get all Hospital records sent to his/her office.   In some cases, they will be blood work, cultures and biopsy results pending at the time of your discharge. Please request that your primary care M.D. goes through all the records of your hospital data and follows up on these results.   Also Note the following: If you experience worsening of your admission symptoms, develop shortness  of breath, life threatening emergency, suicidal or homicidal thoughts you must seek medical attention immediately by calling 911 or calling your MD immediately  if symptoms less severe.   You must read complete instructions/literature along with all the possible adverse reactions/side effects for all the Medicines you take and that have been prescribed to you. Take any new Medicines after you have completely understood and accpet all the possible adverse reactions/side effects.    Do not drive when taking Pain medications or sleeping medications (Benzodaizepines)   Do not take more than prescribed Pain, Sleep and Anxiety Medications. It is not advisable to combine anxiety,sleep and pain medications without talking with your primary care practitioner   Special Instructions: If you have smoked or chewed Tobacco  in the last 2 yrs please stop smoking, stop any regular Alcohol  and or any Recreational drug use.   Wear Seat belts while driving.   Please note: You were cared for by a hospitalist during your hospital stay. Once you are discharged, your primary care physician will handle any further medical issues. Please note that NO REFILLS for any discharge medications will be authorized once you are discharged, as it is imperative that you return to your primary care physician (or establish a relationship with a primary care physician if you do not have one) for your post hospital discharge needs so that they can reassess your need for medications and monitor your lab values.  Time coordinating discharge: 35 minutes  SIGNED:  Pamella Pert, MD, PhD 10/05/2023, 9:05 AM

## 2023-10-05 NOTE — Progress Notes (Signed)
Report given to receiving RN Raeanne Gathers at this time

## 2023-10-05 NOTE — Progress Notes (Signed)
Multiple attempts from 1300 on to call report were unanswered by facility receiving RN/Unit.  Case Manger Amada Jupiter was advised.  She then contacted facility admissions and gave my number to them to have receiving staff call me, however I have not been contacted as of this time.  Transport personnel are now here to pick up patient.

## 2023-10-05 NOTE — Progress Notes (Signed)
Rounding Note    Patient Name: [name] Date of Encounter: 10/05/2023  [facility] Cardiologist: Charlton Haws, MD   Subjective   No complaints  Inpatient Medications    Scheduled Meds:  allopurinol  100 mg Oral Daily   amLODipine  10 mg Oral Daily   aspirin EC  81 mg Oral Daily   Chlorhexidine Gluconate Cloth  6 each Topical QHS   clopidogrel  75 mg Oral Daily   enoxaparin (LOVENOX) injection  30 mg Subcutaneous Q24H   feeding supplement  237 mL Oral TID BM   furosemide  40 mg Oral Daily   irbesartan  75 mg Oral Daily   isosorbide mononitrate  60 mg Oral Daily   levothyroxine  25 mcg Oral QAC breakfast   pantoprazole  40 mg Oral Daily   sertraline  25 mg Oral Daily   simvastatin  20 mg Oral Daily   Continuous Infusions:  PRN Meds: acetaminophen **OR** acetaminophen, albuterol, hydrALAZINE, melatonin, mouth rinse, polyethylene glycol   Vital Signs    Vitals:   10/04/23 1242 10/04/23 2004 10/05/23 0500 10/05/23 0534  BP: 107/61 (!) 122/51  127/60  Pulse: (!) 42 (!) 41  (!) 42  Resp: 20 20  18   Temp: 97.6 F (36.4 C) 97.7 F (36.5 C)  97.9 F (36.6 C)  TempSrc:  Oral  Oral  SpO2: 100% 100%  94%  Weight:   61.5 kg   Height:        Intake/Output Summary (Last 24 hours) at 10/05/2023 0837 Last data filed at 10/05/2023 0500 Gross per 24 hour  Intake 487 ml  Output 1650 ml  Net -1163 ml      10/05/2023    5:00 AM 10/04/2023    4:44 AM 10/03/2023    5:00 AM  Last 3 Weights  Weight (lbs) 135 lb 9.3 oz 136 lb 0.4 oz 134 lb 11.2 oz  Weight (kg) 61.5 kg 61.7 kg 61.1 kg      Telemetry    Afib slow VR - Personally Reviewed  ECG    N/a - Personally Reviewed  Physical Exam   GEN: No acute distress.   Neck: No JVD Cardiac: irreg, 3/6 systolic murmur apex  Respiratory: Clear to auscultation bilaterally. GI: Soft, nontender, non-distended  MS: No edema; No deformity. Neuro:  Nonfocal  Psych: Normal affect   Labs    High Sensitivity  Troponin:   Recent Labs  Lab 10/01/23 0044 10/01/23 0251  TROPONINIHS 37* 39*     Chemistry Recent Labs  Lab 10/01/23 0044 10/01/23 1235 10/02/23 0259 10/03/23 0615 10/04/23 0420 10/05/23 0353  NA  --    < > 139 136 134* 133*  K  --    < > 4.3 4.1 4.3 4.5  CL  --    < > 104 101 100 102  CO2  --    < > 20* 21* 23 22  GLUCOSE  --    < > 94 108* 99 94  BUN  --    < > 36* 38* 45* 50*  CREATININE  --    < > 1.20 1.24 1.36* 1.27*  CALCIUM  --    < > 8.7* 8.6* 8.6* 8.5*  MG  --    < > 2.4  --  1.9 2.1  PROT 6.4*  --   --   --   --   --   ALBUMIN 3.7  --   --   --   --   --  AST 51*  --   --   --   --   --   ALT 29  --   --   --   --   --   ALKPHOS 90  --   --   --   --   --   BILITOT 1.5*  --   --   --   --   --   GFRNONAA  --    < > 56* 54* 49* 53*  ANIONGAP  --    < > 15 14 11 9    < > = values in this interval not displayed.    Lipids  Recent Labs  Lab 10/01/23 1235  CHOL 107  TRIG 60  HDL 40*  LDLCALC 55  CHOLHDL 2.7    Hematology Recent Labs  Lab 10/02/23 0259 10/03/23 0615 10/04/23 0420  WBC 8.8 7.4 6.6  RBC 3.27* 3.23* 3.03*  HGB 10.5* 10.2* 9.6*  HCT 33.1* 32.5* 29.9*  MCV 101.2* 100.6* 98.7  MCH 32.1 31.6 31.7  MCHC 31.7 31.4 32.1  RDW 16.5* 16.0* 15.9*  PLT 144* 152 138*   Thyroid  Recent Labs  Lab 10/01/23 1235  TSH 4.363    BNP Recent Labs  Lab 10/01/23 0045  BNP 2,995.3*    DDimer  Recent Labs  Lab 10/01/23 0044  DDIMER 0.96*     Radiology    DG CHEST PORT 1 VIEW Result Date: 10/04/2023 CLINICAL DATA:  [id number] Dyspnea on exertion [id number] EXAM: PORTABLE CHEST 1 VIEW COMPARISON:  10/01/2023 chest radiograph. FINDINGS: Right rotated chest radiograph. Stable cardiomediastinal silhouette with moderate cardiomegaly. No pneumothorax. Trace right pleural effusion, decreased. No left pleural effusion. No overt pulmonary edema. No consolidative airspace disease. IMPRESSION: 1. Moderate cardiomegaly. No overt pulmonary edema. 2. Trace right  pleural effusion, decreased. Electronically Signed   By: Delbert Phenix M.D.   On: 10/04/2023 12:21    Cardiac Studies    Patient Profile     [name] is a 89 y.o. male with a hx of CAD s/p DES to RCA 5/17, DES to RI '18, carotid artery disease s/p L CEA, HTN, HLD, CVA, paroxsymal Afib (not on long term OAC 2/2 falls), AV block on BB,  who is being seen 10/01/2023 for the evaluation of CHF at the request of Dr Sunnie Nielsen.   Assessment & Plan    1.Acute HFpEF - 09/2023 echo: LVEF 55-60%, no WMAs, indet diastolic function, mod RV dysfunction, mod pulm HTN, severe BAE, severe MR - BNP 2995, CXR bilateral opacities and effusinos. CT bilateral effusions, bilateral edema  - diuresed this admission, now on oral lasix 40mg  daily. Prior rise in Cr but resolving.  - add jardiance 10mg  daily  2.HTN urgency - sbp's initially in the 200s - bp's well controlled  3. Aortic aneurysm - 4.9 cm ascedning aneurysm by CT PE - defer to outpatient cardiolgist if continue to monitor given advanced age  3. CAD with prior stenting to RCA and RI - no acute issues this admission  5. Permanent afib with slow VR - from notes not on anticoag due to fall history - self rate controlled  6. Mitral regurgitation - severe by echo. LVEF 55-60%, LVIDs 2.5 - on my eval mod to severe MR. Central jet which leads to prominent color Doppler jet, vena contracta on my measure 0.6cm in the mod borderline severe range - would manage medically at this time, manage volume status which is well controlled on just low dose  diuretic.   7. Possible pneumonia - abx per primary team   [facility] for discharge from cardiac standpoint.   For questions or updates, please contact [facility] Please consult [email or web address] for contact info under        Signed, Dina Rich, MD  10/05/2023, 8:37 AM

## 2023-10-05 NOTE — TOC Transition Note (Signed)
Transition of Care [facility]) - Discharge Note   Patient Details  Name: [name] MRN: [id number] Date of Birth: [birth year]  Transition of Care [facility]) CM/SW Contact:  Amada Jupiter, LCSW Phone Number: 10/05/2023, 12:27 PM   Clinical Narrative:     Pt medically cleared for dc to [facility] today.  Pt and daughter aware and agreeable.  PTAR called at 12:25pm.  RN to call report to [phone].  No further TOC needs.  Final next level of care: Skilled Nursing Facility Barriers to Discharge: Barriers Resolved   Patient Goals and CMS Choice Patient states their goals for this hospitalization and ongoing recovery are:: return home following rehab          Discharge Placement PASRR number recieved: 10/03/23            Patient chooses bed at: [facility] Patient to be transferred to facility by: PTAR Name of family member notified: daughter, [name] Patient and family notified of of transfer: 10/05/23  Discharge Plan and Services Additional resources added to the After Visit Summary for   In-house Referral: Clinical Social Work   Post Acute Care Choice: Skilled Nursing Facility          DME Arranged: N/A DME Agency: NA                  Social Drivers of Health (SDOH) Interventions SDOH Screenings   Food Insecurity: No Food Insecurity (10/02/2023)  Housing: Low Risk  (10/02/2023)  Transportation Needs: No Transportation Needs (10/02/2023)  Utilities: Not At Risk (10/02/2023)  Financial Resource Strain: Low Risk  (09/03/2023)   Received from [facility]  Physical Activity: Insufficiently Active (09/03/2023)   Received from [facility]  Social Connections: Unknown (10/02/2023)  Stress: No Stress Concern Present (09/03/2023)   Received from [facility]  Tobacco Use: Low Risk  (09/30/2023)     Readmission Risk Interventions    10/03/2023   11:39 AM 11/17/2022    2:30 PM  Readmission Risk Prevention Plan  Post Dischage Appt  Complete  Medication Screening   Complete  Transportation Screening Complete Complete  PCP or Specialist Appt within 5-7 Days Complete   Home Care Screening Complete   Medication Review (RN CM) Complete

## 2023-10-07 ENCOUNTER — Other Ambulatory Visit: Payer: Self-pay

## 2023-10-07 ENCOUNTER — Observation Stay (HOSPITAL_COMMUNITY)
Admission: EM | Admit: 2023-10-07 | Discharge: 2023-10-08 | Disposition: A | Discharge status: Home / Self Care | Payer: Medicare Other | Attending: Internal Medicine | Admitting: Internal Medicine

## 2023-10-07 ENCOUNTER — Emergency Department (HOSPITAL_COMMUNITY): Payer: Medicare Other

## 2023-10-07 DIAGNOSIS — E785 Hyperlipidemia, unspecified: Secondary | ICD-10-CM | POA: Diagnosis not present

## 2023-10-07 DIAGNOSIS — D631 Anemia in chronic kidney disease: Secondary | ICD-10-CM | POA: Diagnosis present

## 2023-10-07 DIAGNOSIS — W19XXXA Unspecified fall, initial encounter: Principal | ICD-10-CM

## 2023-10-07 DIAGNOSIS — E039 Hypothyroidism, unspecified: Secondary | ICD-10-CM | POA: Diagnosis not present

## 2023-10-07 DIAGNOSIS — I10 Essential (primary) hypertension: Secondary | ICD-10-CM | POA: Diagnosis present

## 2023-10-07 DIAGNOSIS — R001 Bradycardia, unspecified: Secondary | ICD-10-CM | POA: Diagnosis present

## 2023-10-07 DIAGNOSIS — Z7982 Long term (current) use of aspirin: Secondary | ICD-10-CM | POA: Diagnosis not present

## 2023-10-07 DIAGNOSIS — N179 Acute kidney failure, unspecified: Secondary | ICD-10-CM | POA: Diagnosis not present

## 2023-10-07 DIAGNOSIS — Z96642 Presence of left artificial hip joint: Secondary | ICD-10-CM | POA: Diagnosis not present

## 2023-10-07 DIAGNOSIS — I5032 Chronic diastolic (congestive) heart failure: Secondary | ICD-10-CM | POA: Diagnosis not present

## 2023-10-07 DIAGNOSIS — I25119 Atherosclerotic heart disease of native coronary artery with unspecified angina pectoris: Secondary | ICD-10-CM | POA: Diagnosis present

## 2023-10-07 DIAGNOSIS — I4821 Permanent atrial fibrillation: Secondary | ICD-10-CM | POA: Insufficient documentation

## 2023-10-07 DIAGNOSIS — I130 Hypertensive heart and chronic kidney disease with heart failure and stage 1 through stage 4 chronic kidney disease, or unspecified chronic kidney disease: Secondary | ICD-10-CM | POA: Insufficient documentation

## 2023-10-07 DIAGNOSIS — S0003XA Contusion of scalp, initial encounter: Secondary | ICD-10-CM | POA: Diagnosis not present

## 2023-10-07 DIAGNOSIS — N1831 Chronic kidney disease, stage 3a: Secondary | ICD-10-CM | POA: Insufficient documentation

## 2023-10-07 DIAGNOSIS — I4891 Unspecified atrial fibrillation: Secondary | ICD-10-CM | POA: Diagnosis present

## 2023-10-07 DIAGNOSIS — N189 Chronic kidney disease, unspecified: Secondary | ICD-10-CM | POA: Diagnosis not present

## 2023-10-07 DIAGNOSIS — W07XXXA Fall from chair, initial encounter: Secondary | ICD-10-CM | POA: Insufficient documentation

## 2023-10-07 DIAGNOSIS — Z7902 Long term (current) use of antithrombotics/antiplatelets: Secondary | ICD-10-CM | POA: Insufficient documentation

## 2023-10-07 DIAGNOSIS — Z8673 Personal history of transient ischemic attack (TIA), and cerebral infarction without residual deficits: Secondary | ICD-10-CM | POA: Insufficient documentation

## 2023-10-07 DIAGNOSIS — Z79899 Other long term (current) drug therapy: Secondary | ICD-10-CM | POA: Insufficient documentation

## 2023-10-07 LAB — COMPREHENSIVE METABOLIC PANEL
ALT: 25 U/L (ref 0–44)
AST: 36 U/L (ref 15–41)
Albumin: 3.2 g/dL — ABNORMAL LOW (ref 3.5–5.0)
Alkaline Phosphatase: 77 U/L (ref 38–126)
Anion gap: 12 (ref 5–15)
BUN: 49 mg/dL — ABNORMAL HIGH (ref 8–23)
CO2: 21 mmol/L — ABNORMAL LOW (ref 22–32)
Calcium: 8.7 mg/dL — ABNORMAL LOW (ref 8.9–10.3)
Chloride: 105 mmol/L (ref 98–111)
Creatinine, Ser: 1.64 mg/dL — ABNORMAL HIGH (ref 0.61–1.24)
GFR, Estimated: 39 mL/min — ABNORMAL LOW (ref >60)
Glucose, Bld: 116 mg/dL — ABNORMAL HIGH (ref 70–99)
Potassium: 4.9 mmol/L (ref 3.5–5.1)
Sodium: 138 mmol/L (ref 135–145)
Total Bilirubin: 0.6 mg/dL (ref 0.0–1.2)
Total Protein: 5.9 g/dL — ABNORMAL LOW (ref 6.5–8.1)

## 2023-10-07 LAB — CBC WITH DIFFERENTIAL/PLATELET
Abs Immature Granulocytes: 0.04 10*3/uL (ref 0.00–0.07)
Basophils Absolute: 0 10*3/uL (ref 0.0–0.1)
Basophils Relative: 1 %
Eosinophils Absolute: 0.2 10*3/uL (ref 0.0–0.5)
Eosinophils Relative: 3 %
HCT: 32.1 % — ABNORMAL LOW (ref 39.0–52.0)
Hemoglobin: 10 g/dL — ABNORMAL LOW (ref 13.0–17.0)
Immature Granulocytes: 1 %
Lymphocytes Relative: 11 %
Lymphs Abs: 0.8 10*3/uL (ref 0.7–4.0)
MCH: 32.1 pg (ref 26.0–34.0)
MCHC: 31.2 g/dL (ref 30.0–36.0)
MCV: 102.9 fL — ABNORMAL HIGH (ref 80.0–100.0)
Monocytes Absolute: 1.4 10*3/uL — ABNORMAL HIGH (ref 0.1–1.0)
Monocytes Relative: 19 %
Neutro Abs: 4.8 10*3/uL (ref 1.7–7.7)
Neutrophils Relative %: 65 %
Platelets: 164 10*3/uL (ref 150–400)
RBC: 3.12 MIL/uL — ABNORMAL LOW (ref 4.22–5.81)
RDW: 16.1 % — ABNORMAL HIGH (ref 11.5–15.5)
WBC: 7.3 10*3/uL (ref 4.0–10.5)
nRBC: 0 % (ref 0.0–0.2)

## 2023-10-07 LAB — TROPONIN I (HIGH SENSITIVITY): Troponin I (High Sensitivity): 29 ng/L — ABNORMAL HIGH (ref <18)

## 2023-10-07 MED ORDER — ONDANSETRON HCL 4 MG PO TABS: 4.0000 mg | ORAL_TABLET | Freq: Four times a day (QID) | ORAL | Status: DC | PRN

## 2023-10-07 MED ORDER — AMLODIPINE BESYLATE 5 MG PO TABS
10.0000 mg | ORAL_TABLET | Freq: Every day | ORAL | Status: DC
Start: 2023-10-08 — End: 2023-10-08
  Administered 2023-10-08: 10 mg via ORAL
  Filled 2023-10-07: qty 2

## 2023-10-07 MED ORDER — SODIUM CHLORIDE 0.9% FLUSH
3.0000 mL | Freq: Two times a day (BID) | INTRAVENOUS | Status: DC
  Administered 2023-10-07 – 2023-10-08 (×2): 3 mL via INTRAVENOUS

## 2023-10-07 MED ORDER — CLOPIDOGREL BISULFATE 75 MG PO TABS
75.0000 mg | ORAL_TABLET | Freq: Every day | ORAL | Status: DC
  Administered 2023-10-08: 75 mg via ORAL
  Filled 2023-10-07: qty 1

## 2023-10-07 MED ORDER — ALLOPURINOL 100 MG PO TABS
100.0000 mg | ORAL_TABLET | Freq: Every day | ORAL | Status: DC
  Administered 2023-10-08: 100 mg via ORAL
  Filled 2023-10-07: qty 1

## 2023-10-07 MED ORDER — FINASTERIDE 5 MG PO TABS
5.0000 mg | ORAL_TABLET | Freq: Every day | ORAL | Status: DC
  Administered 2023-10-08: 5 mg via ORAL
  Filled 2023-10-07: qty 1

## 2023-10-07 MED ORDER — HEPARIN SODIUM (PORCINE) 5000 UNIT/ML IJ SOLN
5000.0000 [IU] | Freq: Three times a day (TID) | INTRAMUSCULAR | Status: DC
  Administered 2023-10-08: 5000 [IU] via SUBCUTANEOUS
  Filled 2023-10-07: qty 1

## 2023-10-07 MED ORDER — SERTRALINE HCL 50 MG PO TABS
25.0000 mg | ORAL_TABLET | Freq: Every day | ORAL | Status: DC
  Administered 2023-10-08: 25 mg via ORAL
  Filled 2023-10-07: qty 1

## 2023-10-07 MED ORDER — RANOLAZINE ER 500 MG PO TB12
1000.0000 mg | ORAL_TABLET | Freq: Two times a day (BID) | ORAL | Status: DC
  Administered 2023-10-08: 1000 mg via ORAL
  Filled 2023-10-07: qty 2

## 2023-10-07 MED ORDER — SODIUM CHLORIDE 0.9 % IV BOLUS
500.0000 mL | Freq: Once | INTRAVENOUS | Status: AC
  Administered 2023-10-07: 500 mL via INTRAVENOUS

## 2023-10-07 MED ORDER — SENNOSIDES-DOCUSATE SODIUM 8.6-50 MG PO TABS: 1.0000 | ORAL_TABLET | Freq: Every evening | ORAL | Status: DC | PRN

## 2023-10-07 MED ORDER — ACETAMINOPHEN 650 MG RE SUPP: 650.0000 mg | Freq: Four times a day (QID) | RECTAL | Status: DC | PRN

## 2023-10-07 MED ORDER — ACETAMINOPHEN 325 MG PO TABS: 650.0000 mg | ORAL_TABLET | Freq: Four times a day (QID) | ORAL | Status: DC | PRN

## 2023-10-07 MED ORDER — ISOSORBIDE MONONITRATE ER 30 MG PO TB24
60.0000 mg | ORAL_TABLET | Freq: Every day | ORAL | Status: DC
  Administered 2023-10-08: 60 mg via ORAL
  Filled 2023-10-07: qty 2

## 2023-10-07 MED ORDER — ONDANSETRON HCL 4 MG/2ML IJ SOLN: 4.0000 mg | Freq: Four times a day (QID) | INTRAMUSCULAR | Status: DC | PRN

## 2023-10-07 MED ORDER — LEVOTHYROXINE SODIUM 25 MCG PO TABS
25.0000 ug | ORAL_TABLET | Freq: Every day | ORAL | Status: DC
  Administered 2023-10-08: 25 ug via ORAL
  Filled 2023-10-07: qty 1

## 2023-10-07 MED ORDER — SIMVASTATIN 20 MG PO TABS: 20.0000 mg | ORAL_TABLET | Freq: Every day | ORAL | Status: DC

## 2023-10-07 NOTE — ED Triage Notes (Signed)
Pt BIBEMS from SNF [facility] ) s/p unwitnessed fall. As per report pt slid from his chair to the floor hitting his head. Pt currently taking Eliquis. No c/o pain or discomfort on arrival

## 2023-10-07 NOTE — ED Notes (Signed)
 Pt back from CT

## 2023-10-07 NOTE — ED Provider Notes (Signed)
[place] EMERGENCY DEPARTMENT AT [facility] Provider Note  Arrival date/time:10/07/2023 9:55 PM  HPI/ROS   [name] is a 89 y.o. male with PMH significant for 2nd degree AV block, HTN, CAD, HLD, CHF, A-fib who presents for fall  History is provided by EMS and patient. Patient is presenting from his facility due to a fall/slide out of his recliner.  Endorse that he was sitting in his recliner and slowly slid to the ground hitting his head on the ground. Not taking any blood thinner medications, however does take aspirin and Plavix. Currently denying any pain.  A complete ROS was performed with pertinent positives/negatives noted above.   ED Course and Medical Decision Making   I personally reviewed the patient's vitals.  Assessment/Plan: 89 year old patient presenting with fall out of recliner with positive head trauma.  On arrival patient primary survey was performed: Airway intact, good bilateral breath sounds, good circulation 2+ pulses in all 4 extremities and normal manual blood pressure.  Chest x-ray shows no pneumothorax, effusion, trace midline. No displaced rib fractures. Pelvis x-ray shows intact pelvic ring  Patient noted to have HR in the 40's, which he endorses is normal for him. EKG was obtained which showed possible atrial fibrillation, unclear p-waves.  Workup: CBC shows no leukocytosis or significant anemia CMP shows AKI with creatinine of 1.64 from baseline 1.2. Troponin is 29 which is baseline. CT head shows no ICH or skull fracture CT c-spine shows no acute fracture or malalignment of c-spine.  Patient given 500cc bolus of fluid for AKI.  Plan to admit patient overnight for monitoring of his bradycardia and AKI.  Disposition:  I discussed the case with hospitalist who graciously agreed to admit the patient to their service for continued care.   Clinical Impression:  1. Fall, initial encounter   2. Bradycardia   3. AKI (acute kidney  injury) (HCC)     Rx / DC Orders ED Discharge Orders     None       The plan for this patient was discussed with Dr. Silverio Lay, who voiced agreement and who oversaw evaluation and treatment of this patient.   Clinical Complexity A medically appropriate history, review of systems, and physical exam was performed.  Patient's presentation is most consistent with acute presentation with potential threat to life or bodily function.  Medical Decision Making Amount and/or Complexity of Data Reviewed Labs: ordered. Radiology: ordered. ECG/medicine tests: ordered.  Risk Decision regarding hospitalization.    Physical Exam and Medical History   Vitals:   10/07/23 2045 10/07/23 2100 10/07/23 2115 10/07/23 2130  BP: (!) 135/59 (!) 120/52 (!) 105/55 (!) 114/49  Pulse: (!) 40 (!) 41 (!) 38 (!) 38  Resp: 20 16 19 19   SpO2: 100% 100% 100% 100%  Weight:      Height:         Physical Exam:  General: No distress, appears well hydrated and well nourished   Head: Normocephalic, atraumatic.  No skull depressions or lacerations.  No conjunctival hemorrhage No periorbital ecchymoses, Racoon Eyes, or Battle Sign bilaterally Ears atraumatic No nasal septal deviation or hematoma  PERRL, EOMI, sclera anicteric. Mucus membranes moist.    Neck: Supple, trachea midline No TTP over midline cervical spine, no step offs or deformities.     Cardiovascular: RATE: bradycardic to 40 RHYTHM: regular 2+ radial, femoral, DP pulses bilaterally   Respiratory/Chest Wall: Respiratory: normal WOB, breath sounds CTAB Clavicles stable to compression Chest stable to AP and Lateral Compression,  Chest nontender to palpation    Extremities: Warm, well perfused. No gross deformities.    Gastrointestinal: Abdomen soft, non tender, non distended   Neurologic: LOC: awake/alert EOM:  intact, conjugate   Genitourinary: Normal genitalia   Skin: Normal, no rash or lesions.   Glasgow Coma Scale: Eye  opening: 4  Verbal:  5  Motor:  6  GCS Total: 15     Rectal: Deferred   Spine: No TTP along midline C/T/L spine, no step offs or deformities    Other:        Medical History: Allergies  Allergen Reactions   Beta Adrenergic Blockers Other (See Comments)    Hx of AV block. Should avoid   Past Medical History:  Diagnosis Date   Arthritis    AV BLOCK, 1ST DEGREE    Carotid artery disease (HCC)    a. s/p Left ECA followed by [name]   Coronary artery disease    Coronary artery disease involving coronary bypass graft of native heart with angina pectoris (HCC)    a. LHC 12/2015  3vd Left Cx 100% with colaterals, and distally LAD occluded, DES to prox-mid RCA  b. 02/2017: repeat cath with patent RCA stent and stable CAD; 02/27/2017: Rota-PCI to Ost RI   GERD (gastroesophageal reflux disease)    HLD (hyperlipidemia)    HTN (hypertension)    Hypothyroidism    Myocardial infarction (HCC)    20 years   NSVT (nonsustained ventricular tachycardia) (HCC)    during stress test 12/2015   Presence of drug coated stent in RCA & Ramus Intermedius. 02/28/2017   12/2015: PCI p-mRCA - Stent Resolute Integ 3.5x34 02/2017: Rota-PCI o-mRamus - overlapping DES STENT SYNERGY DES 3X32  & 3x24 (tapered post-dilation)   Stroke [facility])     Past Surgical History:  Procedure Laterality Date   CARDIAC CATHETERIZATION     x2   CARDIAC CATHETERIZATION N/A 01/11/2016   Procedure: Left Heart Cath and Coronary Angiography;  Surgeon: Iran Ouch, MD;  Location: [facility] INVASIVE CV LAB;  Service: Cardiovascular;  Laterality: N/A;   CAROTID ENDARTERECTOMY  11/28/2006   left   CATARACT EXTRACTION W/ INTRAOCULAR LENS IMPLANT Bilateral    CORONARY ATHERECTOMY N/A 02/27/2017   Procedure: Coronary Atherectomy;  Surgeon: Lennette Bihari, MD;  Location: [facility] INVASIVE CV LAB;  Service: Cardiovascular;  Laterality: N/A;   HERNIA REPAIR     LEFT HEART CATH AND CORONARY ANGIOGRAPHY N/A 02/21/2017   Procedure: Left Heart  Cath and Coronary Angiography;  Surgeon: Runell Gess, MD;  Location: [facility] INVASIVE CV LAB;  Service: Cardiovascular;  Laterality: N/A;   TONSILLECTOMY     TOTAL HIP ARTHROPLASTY Left 11/13/2022   Procedure: LEFT TOTAL HIP ARTHROPLASTY ANTERIOR APPROACH;  Surgeon: Kathryne Hitch, MD;  Location: [facility] OR;  Service: Orthopedics;  Laterality: Left;   Family History  Problem Relation Age of Onset   Diabetes Mother    Diabetes Sister    Cancer Sister 16       colon   Heart disease Sister     Social History   Tobacco Use   Smoking status: Never   Smokeless tobacco: Never  Vaping Use   Vaping status: Never Used  Substance Use Topics   Alcohol use: Yes    Alcohol/week: 7.0 standard drinks of alcohol    Types: 7 Cans of beer per week    Comment: 7 cans/day   Drug use: No    Procedures   If procedures were  preformed on this patient, they are listed below:  Procedures   -------- HPI and MDM generated using voice dictation software and may contain dictation errors. Please contact me for any clarification or with any questions.   Cephus Slater, MD Emergency Medicine PGY-2    Caron Presume, MD 10/07/23 2155    Charlynne Pander, MD 10/08/23 1110

## 2023-10-07 NOTE — Progress Notes (Addendum)
Transition of Care [facility]) - CAGE-AID Screening   Patient Details  Name: [name] MRN: [id number] Date of Birth: 03-Oct-1927  [facility]) CM/SW Contact:    Katha Hamming, RN Phone Number: 10/07/2023, 9:51 PM    CAGE-AID Screening:    Have You Ever Felt You Ought to Cut Down on Your Drinking or Drug Use?: No Have People Annoyed You By Critizing Your Drinking Or Drug Use?: No Have You Felt Bad Or Guilty About Your Drinking Or Drug Use?: No Have You Ever Had a Drink or Used Drugs First Thing In The Morning to Steady Your Nerves or to Get Rid of a Hangover?: No CAGE-AID Score: 0  Substance Abuse Education Offered: No

## 2023-10-07 NOTE — Hospital Course (Signed)
[name] is a 89 y.o. male with medical history significant for CAD s/p DES to RCA 2017 and ramus 2018, permanent atrial fibrillation with slow VR not on AC due to frequent falls, chronic HFpEF (EF 55-60%), severe mitral regurgitation, history of CVA, s/p left CEA, CKD stage IIIa, HTN, HLD, hypothyroidism, HOH who is admitted with AKI after a fall at SNF.

## 2023-10-07 NOTE — ED Notes (Signed)
 Patient transported to CT

## 2023-10-07 NOTE — Progress Notes (Signed)
   10/07/23 2030  Spiritual Encounters  Type of Visit Initial  Care provided to: Pt not available  Referral source Trauma page  Reason for visit Trauma  OnCall Visit No   Chaplain responded to a level two trauma. The patient, [name] was attended to by the medical team.  No family is present.  If a chaplain is requested someone will respond   Valerie Roys Chaplain  [facility] [phone]

## 2023-10-07 NOTE — H&P (Signed)
History and Physical    JEVAUN STRICK ZOX:[id number] DOB: 1928/02/15 DOA: 10/07/2023  PCP: Stevphen Rochester, MD  Patient coming from: SNF  I have personally briefly reviewed patient's old medical records in [facility] [place] [facility]  Chief Complaint: Fall at SNF  HPI: TREYSHON BUCHANON is a 89 y.o. male with medical history significant for CAD s/p DES to RCA 2017 and ramus 2018, permanent atrial fibrillation with slow VR not on AC due to frequent falls, chronic HFpEF (EF 55-60%), severe mitral regurgitation, history of CVA, s/p left CEA, CKD stage IIIa, HTN, HLD, hypothyroidism, HOH who presented to the ED from SNF for evaluation after a fall.  Patient was recently admitted at [facility] 2/10-2/15 for acute on chronic HFpEF with possible superimposed pneumonia.  He was diuresed and treated with empiric antibiotics.  He was seen by cardiology during admission.  He was discharged on new Lasix 40 mg as well as addition of Jardiance 10 mg daily.  While at his facility earlier today patient had a fall after sliding out of his recliner.  This was unwitnessed.  Patient states that he did not hit his head or lose consciousness.  He says this was a slow fall and did not have any significant injury.  He denied any lightheadedness or dizziness.  Denies chest pain, dyspnea.  He reports good urine output.  ED Course  Labs/Imaging on admission: I have personally reviewed following labs and imaging studies.  Initial vitals showed BP 121/53, pulse 46, RR 28, temperature not recorded, SpO2 100% on room air.  Labs show BUN 49, creatinine 1.64 (baseline 1.0-1.2), serum glucose 116, sodium 138, potassium 4.9, bicarb 21, LFTs within normal limits, WBC 7.3, hemoglobin 10.0, platelets 164,000, troponin 29.  Portable chest x-ray negative for acute traumatic findings.  Chronic cardiomegaly with improved pleural effusions noted.  Pelvic x-ray negative for pelvic fracture.  Intact left hip arthroplasty noted.  CT  head and C-spine without contrast negative for acute traumatic injury.  Patient was given 5 cc normal saline.  The hospitalist service was consulted to admit.  Review of Systems: All systems reviewed and are negative except as documented in history of present illness above.   Past Medical History:  Diagnosis Date   Arthritis    AV BLOCK, 1ST DEGREE    Carotid artery disease (HCC)    a. s/p Left ECA followed by [name]   Coronary artery disease    Coronary artery disease involving coronary bypass graft of native heart with angina pectoris (HCC)    a. LHC 12/2015  3vd Left Cx 100% with colaterals, and distally LAD occluded, DES to prox-mid RCA  b. 02/2017: repeat cath with patent RCA stent and stable CAD; 02/27/2017: Rota-PCI to Ost RI   GERD (gastroesophageal reflux disease)    HLD (hyperlipidemia)    HTN (hypertension)    Hypothyroidism    Myocardial infarction (HCC)    20 years   NSVT (nonsustained ventricular tachycardia) (HCC)    during stress test 12/2015   Presence of drug coated stent in RCA & Ramus Intermedius. 02/28/2017   12/2015: PCI p-mRCA - Stent Resolute Integ 3.5x34 02/2017: Rota-PCI o-mRamus - overlapping DES STENT SYNERGY DES 3X32  & 3x24 (tapered post-dilation)   Stroke [facility])     Past Surgical History:  Procedure Laterality Date   CARDIAC CATHETERIZATION     x2   CARDIAC CATHETERIZATION N/A 01/11/2016   Procedure: Left Heart Cath and Coronary Angiography;  Surgeon: Iran Ouch,  MD;  Location: [facility] INVASIVE CV LAB;  Service: Cardiovascular;  Laterality: N/A;   CAROTID ENDARTERECTOMY  11/28/2006   left   CATARACT EXTRACTION W/ INTRAOCULAR LENS IMPLANT Bilateral    CORONARY ATHERECTOMY N/A 02/27/2017   Procedure: Coronary Atherectomy;  Surgeon: Lennette Bihari, MD;  Location: [facility] INVASIVE CV LAB;  Service: Cardiovascular;  Laterality: N/A;   HERNIA REPAIR     LEFT HEART CATH AND CORONARY ANGIOGRAPHY N/A 02/21/2017   Procedure: Left Heart Cath and Coronary  Angiography;  Surgeon: Runell Gess, MD;  Location: [facility] INVASIVE CV LAB;  Service: Cardiovascular;  Laterality: N/A;   TONSILLECTOMY     TOTAL HIP ARTHROPLASTY Left 11/13/2022   Procedure: LEFT TOTAL HIP ARTHROPLASTY ANTERIOR APPROACH;  Surgeon: Kathryne Hitch, MD;  Location: [facility] OR;  Service: Orthopedics;  Laterality: Left;    Social History:  reports that he has never smoked. He has never used smokeless tobacco. He reports current alcohol use of about 7.0 standard drinks of alcohol per week. He reports that he does not use drugs.  Allergies  Allergen Reactions   Beta Adrenergic Blockers Other (See Comments)    Hx of AV block. Should avoid    Family History  Problem Relation Age of Onset   Diabetes Mother    Diabetes Sister    Cancer Sister 44       colon   Heart disease Sister      Prior to Admission medications   Medication Sig Start Date End Date Taking? Authorizing Provider  acetaminophen (TYLENOL) 500 MG tablet Take 1 tablet (500 mg total) by mouth every 6 (six) hours as needed for moderate pain (wrist). Patient taking differently: Take 500 mg by mouth daily. 02/28/17   Arty Baumgartner, NP  allopurinol (ZYLOPRIM) 100 MG tablet Take 100 mg by mouth daily. 02/06/22   [provider]  amLODipine (NORVASC) 10 MG tablet Take 1 tablet (10 mg total) by mouth daily. 10/05/23   Leatha Gilding, MD  aspirin EC 81 MG EC tablet Take 1 tablet (81 mg total) by mouth daily. 01/12/16   Arty Baumgartner, NP  b complex vitamins tablet Take 1 tablet by mouth daily.    [provider]  Calcium Carbonate (CALCIUM 500 PO) Take 500 mg by mouth daily. Patient not taking: Reported on 10/01/2023    [provider]  Calcium Carbonate Antacid (TUMS PO) Take 1 tablet by mouth as needed (heartburn).    [provider]  Cholecalciferol (VITAMIN D-3) 1000 units CAPS Take 1,000 Units by mouth daily. Patient not taking: Reported on 10/01/2023    [provider]  clopidogrel (PLAVIX) 75 MG tablet Take 1 tablet (75 mg total) by mouth daily. 04/10/23   Wendall Stade, MD  colchicine 0.6 MG tablet Take 0.6 mg by mouth daily as needed (Gout flare up). Patient not taking: Reported on 10/01/2023 07/09/23   [provider]  empagliflozin (JARDIANCE) 10 MG TABS tablet Take 1 tablet (10 mg total) by mouth daily. 10/05/23   Leatha Gilding, MD  ferrous sulfate 325 (65 FE) MG tablet Take 325 mg by mouth 2 (two) times daily with a meal. Patient not taking: Reported on 10/01/2023    [provider]  finasteride (PROSCAR) 5 MG tablet Take 5 mg by mouth daily. Patient not taking: Reported on 10/01/2023 07/09/23 07/08/24  [provider]  folic acid (FOLVITE) 1 MG tablet Take 1 tablet (1 mg total) by mouth daily. Patient not taking:  Reported on 10/01/2023 12/19/20   Standley Brooking, MD  furosemide (LASIX) 40 MG tablet Take 1 tablet (40 mg total) by mouth daily. 10/05/23   Leatha Gilding, MD  Glucosamine 500 MG CAPS Take 500 mg by mouth daily. Patient not taking: Reported on 10/01/2023    [provider]  irbesartan (AVAPRO) 75 MG tablet Take 1 tablet (75 mg total) by mouth daily. 10/05/23   Leatha Gilding, MD  isosorbide mononitrate (IMDUR) 60 MG 24 hr tablet Take 1 tablet (60 mg total) by mouth daily. 10/05/23   Leatha Gilding, MD  levothyroxine (SYNTHROID) 25 MCG tablet Take 25 mcg by mouth daily. 03/23/22   [provider]  lidocaine (HM LIDOCAINE PATCH) 4 % Place 1 patch onto the skin daily. Patient not taking: Reported on 10/01/2023 02/18/23   Small, Brooke L, PA  Multiple Vitamin (MULTIVITAMIN) capsule Take 1 capsule by mouth daily.    [provider]  nitroGLYCERIN (NITROSTAT) 0.4 MG SL tablet PLACE 1 TABLET UNDER THE TONGUE EVERY 5 MINUTES AS NEEDED FOR CHEST PAIN. NOT TO EXCEED 3 DOSES Patient taking differently: Place 0.4 mg under the tongue every 5 (five) minutes as needed for chest pain.  06/02/20   Wendall Stade, MD  Omega-3 Fatty Acids (FISH OIL) 1000 MG CAPS Take 1,000 mg by mouth daily.    [provider]  polyethylene glycol (MIRALAX) 17 g packet Take 17 g by mouth daily. 02/18/23   Small, Brooke L, PA  ranolazine (RANEXA) 1000 MG SR tablet TAKE 1 TABLET TWICE A DAY Patient taking differently: Take 1,000 mg by mouth once. 04/10/22   Wendall Stade, MD  sertraline (ZOLOFT) 25 MG tablet Take 25 mg by mouth daily.    [provider]  simvastatin (ZOCOR) 20 MG tablet TAKE 1 TABLET DAILY Patient taking differently: Take 20 mg by mouth daily. 11/21/22   Wendall Stade, MD  vitamin C (ASCORBIC ACID) 500 MG tablet Take 500 mg by mouth daily. Patient not taking: Reported on 10/01/2023    [provider]  zinc sulfate 220 (50 Zn) MG capsule Take 1 capsule (220 mg total) by mouth daily. 12/19/20   Standley Brooking, MD    Physical Exam: Vitals:   10/07/23 2100 10/07/23 2115 10/07/23 2130 10/07/23 2215  BP: (!) 120/52 (!) 105/55 (!) 114/49 (!) 108/55  Pulse: (!) 41 (!) 38 (!) 38 (!) 38  Resp: 16 19 19  (!) 21  Temp:    97.6 F (36.4 C)  TempSrc:    Oral  SpO2: 100% 100% 100% 100%  Weight:      Height:       Constitutional: Elderly man resting supine in bed, NAD, calm, comfortable Eyes: EOMI, lids and conjunctivae normal ENMT: Mucous membranes are moist. Posterior pharynx clear of any exudate or lesions.Normal dentition.  Neck: normal, supple, no masses. Respiratory: clear to auscultation bilaterally, no wheezing, no crackles. Normal respiratory effort. No accessory muscle use.  Cardiovascular: Bradycardic, no murmurs / rubs / gallops. No extremity edema. 2+ pedal pulses. Abdomen: no tenderness, no masses palpated.  Musculoskeletal: no clubbing / cyanosis. No joint deformity upper and lower extremities. Good ROM, no contractures. Normal muscle tone.  Skin: no rashes, lesions, ulcers. No induration Neurologic:  Sensation intact. Strength equal  bilaterally. Psychiatric: Normal judgment and insight. Alert and oriented x 3. Normal mood.   EKG: Personally reviewed. Atrial fibrillation, rate 41.  Similar to previous.  Assessment/Plan Principal Problem:   Acute kidney  injury superimposed on chronic kidney disease (HCC) Active Problems:   Essential hypertension   Coronary artery disease involving native coronary artery of native heart with angina pectoris (HCC)   HLD (hyperlipidemia)   Atrial fibrillation with slow ventricular response (HCC)   Anemia due to chronic kidney disease   Hypothyroid   [name] is a 89 y.o. male with medical history significant for CAD s/p DES to RCA 2017 and ramus 2018, permanent atrial fibrillation with slow VR not on AC due to frequent falls, chronic HFpEF (EF 55-60%), severe mitral regurgitation, history of CVA, s/p left CEA, CKD stage IIIa, HTN, HLD, hypothyroidism, HOH who is admitted with AKI after a fall at SNF.  Assessment and Plan: Acute kidney injury superimposed on CKD stage IIIa: Creatinine 1.64 on admission compared to previous baseline 1.0-1.2.  Recent admission for CHF and started on Lasix and Jardiance.  CXR with improved pleural effusions. -S/p 500 cc NS given in the ED -Hold further fluids, hold Lasix and Jardiance -Repeat labs in AM  Fall at [facility], question of syncope versus near syncope: Initially there was question of near syncope or syncopal episode however patient denies any of the symptoms.  He has chronic bradycardia related to slow A-fib, baseline HR in 30s-40s.  At this time have low suspicion of true syncope or symptomatic bradycardia. -Keep on telemetry -Continue PT/OT, fall precautions  Permanent atrial fibrillation with slow VR, chronic: Chronic slow A-fib, heart rate at baseline 30s-40s.  Drops into the 30s while sleeping.  He is not on beta-blocker.  Not on full dose anticoagulation given history of frequent falls.  Continue Plavix.  CAD s/p DES to RCA 2017 and ramus  2018: Stable, denies chest pain.  Continue Plavix, Imdur, Ranexa.  Hypertension: Continue amlodipine, Imdur.  Hyperlipidemia: Continue simvastatin.  Hypothyroidism: Continue Synthroid.  Anemia of chronic disease: Hemoglobin stable at 10.0.   DVT prophylaxis: heparin injection 5,000 Units Start: 10/08/23 0600 Code Status:   Code Status: Limited: Do not attempt resuscitation (DNR) -DNR-LIMITED -Do Not Intubate/DNI Discussed with patient on admission. Family Communication: Discussed with patient, he has discussed with family Disposition Plan: From SNF and likely return to SNF pending clinical progress Consults called: None Severity of Illness: The appropriate patient status for this patient is OBSERVATION. Observation status is judged to be reasonable and necessary in order to provide the required intensity of service to ensure the patient's safety. The patient's presenting symptoms, physical exam findings, and initial radiographic and laboratory data in the context of their medical condition is felt to place them at decreased risk for further clinical deterioration. Furthermore, it is anticipated that the patient will be medically stable for discharge from the hospital within 2 midnights of admission.   Darreld Mclean MD [facility]  If 7PM-7AM, please contact night-coverage [email or web address]  10/07/2023, 11:44 PM

## 2023-10-07 NOTE — ED Notes (Addendum)
Trauma Response Nurse Documentation   [name] is a 89 y.o. male arriving to [facility] ED via EMS  On Eliquis (apixaban) daily. Trauma was activated as a Level 2 by ED charge RN based on the following trauma criteria Elderly patients > 65 with head trauma on anti-coagulation (excluding ASA).  Patient cleared for CT by Dr. Silverio Lay EDP. Pt transported to CT with trauma response nurse present to monitor. RN remained with the patient throughout their absence from the department for clinical observation.   GCS 15.   History   Past Medical History:  Diagnosis Date   Arthritis    AV BLOCK, 1ST DEGREE    Carotid artery disease (HCC)    a. s/p Left ECA followed by [name]   Coronary artery disease    Coronary artery disease involving coronary bypass graft of native heart with angina pectoris (HCC)    a. LHC 12/2015  3vd Left Cx 100% with colaterals, and distally LAD occluded, DES to prox-mid RCA  b. 02/2017: repeat cath with patent RCA stent and stable CAD; 02/27/2017: Rota-PCI to Ost RI   GERD (gastroesophageal reflux disease)    HLD (hyperlipidemia)    HTN (hypertension)    Hypothyroidism    Myocardial infarction (HCC)    20 years   NSVT (nonsustained ventricular tachycardia) (HCC)    during stress test 12/2015   Presence of drug coated stent in RCA & Ramus Intermedius. 02/28/2017   12/2015: PCI p-mRCA - Stent Resolute Integ 3.5x34 02/2017: Rota-PCI o-mRamus - overlapping DES STENT SYNERGY DES 3X32  & 3x24 (tapered post-dilation)   Stroke [facility])      Past Surgical History:  Procedure Laterality Date   CARDIAC CATHETERIZATION     x2   CARDIAC CATHETERIZATION N/A 01/11/2016   Procedure: Left Heart Cath and Coronary Angiography;  Surgeon: Iran Ouch, MD;  Location: [facility] INVASIVE CV LAB;  Service: Cardiovascular;  Laterality: N/A;   CAROTID ENDARTERECTOMY  11/28/2006   left   CATARACT EXTRACTION W/ INTRAOCULAR LENS IMPLANT Bilateral    CORONARY ATHERECTOMY N/A 02/27/2017   Procedure:  Coronary Atherectomy;  Surgeon: Lennette Bihari, MD;  Location: [facility] INVASIVE CV LAB;  Service: Cardiovascular;  Laterality: N/A;   HERNIA REPAIR     LEFT HEART CATH AND CORONARY ANGIOGRAPHY N/A 02/21/2017   Procedure: Left Heart Cath and Coronary Angiography;  Surgeon: Runell Gess, MD;  Location: [facility] INVASIVE CV LAB;  Service: Cardiovascular;  Laterality: N/A;   TONSILLECTOMY     TOTAL HIP ARTHROPLASTY Left 11/13/2022   Procedure: LEFT TOTAL HIP ARTHROPLASTY ANTERIOR APPROACH;  Surgeon: Kathryne Hitch, MD;  Location: [facility] OR;  Service: Orthopedics;  Laterality: Left;       Initial Focused Assessment (If applicable, or please see trauma documentation): Alert/oriented male presents via EMS from SNF after a fall, slid down onto floor with posterior head injury. Denies pain. No obvious injury noted on assessment.  Airway patent, BS clear No obvious uncontrolled hemorrhage GCS 15  CT's Completed:   CT Head and CT C-Spine   Interventions:  IV start and trauma lab draw Portable chest and pelvis XRAY CT head and c-spine EKG  Plan for disposition:  Admit for AKI  Consults completed:  Hospitalist 2140  Event Summary: Presents via EMS from SNF after a fall with posterior head injury. States no pain on arrival to ED. Trauma scans without acute injury. Labs with acute kidney injury. Admit to hospitalist MTP Summary (If applicable): NA  Bedside  handoff with ED RN Gillis Ends.    Rylie Limburg O Anwitha Mapes  Trauma Response RN  Please call TRN at [phone] for further assistance.

## 2023-10-08 DIAGNOSIS — Z515 Encounter for palliative care: Secondary | ICD-10-CM | POA: Diagnosis not present

## 2023-10-08 DIAGNOSIS — E7800 Pure hypercholesterolemia, unspecified: Secondary | ICD-10-CM | POA: Diagnosis not present

## 2023-10-08 DIAGNOSIS — N179 Acute kidney failure, unspecified: Secondary | ICD-10-CM | POA: Diagnosis not present

## 2023-10-08 DIAGNOSIS — E039 Hypothyroidism, unspecified: Secondary | ICD-10-CM | POA: Diagnosis not present

## 2023-10-08 DIAGNOSIS — I25119 Atherosclerotic heart disease of native coronary artery with unspecified angina pectoris: Secondary | ICD-10-CM | POA: Diagnosis not present

## 2023-10-08 DIAGNOSIS — I4891 Unspecified atrial fibrillation: Secondary | ICD-10-CM

## 2023-10-08 DIAGNOSIS — R55 Syncope and collapse: Secondary | ICD-10-CM | POA: Diagnosis not present

## 2023-10-08 DIAGNOSIS — I10 Essential (primary) hypertension: Secondary | ICD-10-CM

## 2023-10-08 LAB — BASIC METABOLIC PANEL
Anion gap: 13 (ref 5–15)
BUN: 47 mg/dL — ABNORMAL HIGH (ref 8–23)
CO2: 19 mmol/L — ABNORMAL LOW (ref 22–32)
Calcium: 8.5 mg/dL — ABNORMAL LOW (ref 8.9–10.3)
Chloride: 107 mmol/L (ref 98–111)
Creatinine, Ser: 1.58 mg/dL — ABNORMAL HIGH (ref 0.61–1.24)
GFR, Estimated: 41 mL/min — ABNORMAL LOW (ref >60)
Glucose, Bld: 95 mg/dL (ref 70–99)
Potassium: 4.7 mmol/L (ref 3.5–5.1)
Sodium: 139 mmol/L (ref 135–145)

## 2023-10-08 LAB — CBC
HCT: 25 % — ABNORMAL LOW (ref 39.0–52.0)
Hemoglobin: 8 g/dL — ABNORMAL LOW (ref 13.0–17.0)
MCH: 31.7 pg (ref 26.0–34.0)
MCHC: 32 g/dL (ref 30.0–36.0)
MCV: 99.2 fL (ref 80.0–100.0)
Platelets: 163 10*3/uL (ref 150–400)
RBC: 2.52 MIL/uL — ABNORMAL LOW (ref 4.22–5.81)
RDW: 15.8 % — ABNORMAL HIGH (ref 11.5–15.5)
WBC: 7 10*3/uL (ref 4.0–10.5)
nRBC: 0 % (ref 0.0–0.2)

## 2023-10-08 LAB — TROPONIN I (HIGH SENSITIVITY): Troponin I (High Sensitivity): 26 ng/L — ABNORMAL HIGH (ref <18)

## 2023-10-08 MED ORDER — FUROSEMIDE 40 MG PO TABS: 20.0000 mg | ORAL_TABLET | Freq: Every day | ORAL | Status: DC

## 2023-10-08 NOTE — Evaluation (Signed)
Occupational Therapy Evaluation Patient Details Name: [name] MRN: [id number] DOB: September 28, 1927 Today's Date: 10/08/2023   History of Present Illness   Patient is a 89 year old male who presented after sliding down in his chair at SNF.  Noted to have AKI but all scans negative for any fractures.  Just discharged from [facility] where he was admitted with acute on chronic diastolic heart failure exacerbation, hypertensive urgency, congestive heart failure, community acquired pneumonia. PMH: afib with slow ventricular response, MI,arthritis, stroke, cardiac cathx2     Clinical Impressions Pt completed toileting tasks and LB dressing tasks sit to stand at mod assist level.  Min assist was needed for sit to stand and stand pivot transfer to the bedside chair and back.  Prior to admission he was at [facility] for rehab as he does not have 24 hour supervision post discharge.  Recommend continued OT at SNF as pt has discharge orders for today, in order to progress ADL independence and safety for eventual return home with family.  Will defer OT treatment to SNF.       Functional Status Assessment   Patient has had a recent decline in their functional status and/or demonstrates limited ability to make significant improvements in function in a reasonable and predictable amount of time     Equipment Recommendations   None recommended by OT      Precautions/Restrictions   Precautions Precautions: Fall Precaution/Restrictions Comments: monitor vitals low baseline HR Restrictions Weight Bearing Restrictions Per Provider Order: No     Mobility Bed Mobility Overal bed mobility: Needs Assistance Bed Mobility: Supine to Sit, Sit to Supine     Supine to sit: Min assist Sit to supine: Mod assist   General bed mobility comments: Min assist for bringing trunk up to sitting and for scooting to the edge of the stretcher.  Mod assist to bring LEs back into the stretcher and then squaring up his body.     Transfers Overall transfer level: Needs assistance Equipment used: Rolling walker (2 wheels) Transfers: Sit to/from Stand, Bed to chair/wheelchair/BSC Sit to Stand: Min assist     Step pivot transfers: Min assist     General transfer comment: Mod demonstrational cueing for hand placement with sit to stand transition.      Balance Overall balance assessment: Needs assistance, History of Falls Sitting-balance support: Feet unsupported, Bilateral upper extremity supported Sitting balance-Leahy Scale: Fair     Standing balance support: Reliant on assistive device for balance Standing balance-Leahy Scale: Poor Standing balance comment: Pt needs UE support for balance                           ADL either performed or assessed with clinical judgement   ADL Overall ADL's : Needs assistance/impaired Eating/Feeding: Set up;Sitting   Grooming: Set up;Sitting   Upper Body Bathing: Supervision/ safety;Sitting   Lower Body Bathing: Minimal assistance;Sit to/from stand   Upper Body Dressing : Minimal assistance;Sitting   Lower Body Dressing: Moderate assistance;Sit to/from stand   Toilet Transfer: Minimal assistance;Stand-pivot;Ambulation;Rolling walker (2 wheels)   Toileting- Clothing Manipulation and Hygiene: Minimal assistance;Sit to/from stand       Functional mobility during ADLs: Minimal assistance (stand pivot with the RW) General ADL Comments: Pt HOH needed gestural cueing at times for hand placement with sit to stand from the chair.  HR low ranging from 39 BPM up to 45-50 BPM with activity.  O2 sats at 97% or better with  activity.     Vision Baseline Vision/History: 1 Wears glasses Ability to See in Adequate Light: 0 Adequate Patient Visual Report: No change from baseline Vision Assessment?: No apparent visual deficits            Pertinent Vitals/Pain Pain Assessment Pain Assessment: No/denies pain     Extremity/Trunk Assessment Upper Extremity  Assessment Upper Extremity Assessment: Generalized weakness   Lower Extremity Assessment Lower Extremity Assessment: Defer to PT evaluation   Cervical / Trunk Assessment Cervical / Trunk Assessment: Kyphotic   Communication Communication Communication: Impaired Factors Affecting Communication: Hearing impaired   Cognition Arousal: Alert Behavior During Therapy: WFL for tasks assessed/performed Cognition: History of cognitive impairments             OT - Cognition Comments: Pt not oriented to place or situation.  Able to state month but not year.                 Following commands: Impaired Following commands impaired: Follows one step commands with increased time     Cueing  General Comments   Cueing Techniques: Verbal cues;Gestural cues              Home Living Family/patient expects to be discharged to:: Skilled nursing facility                                            OT Problem List: Impaired balance (sitting and/or standing);Decreased knowledge of precautions;Decreased safety awareness;Cardiopulmonary status limiting activity;Decreased knowledge of use of DME or AE;Decreased activity tolerance   OT Treatment/Interventions: Self-care/ADL training;DME and/or AE instruction;Therapeutic activities;Balance training;Patient/family education;Energy conservation        OT Frequency:  Min 1X/week       AM-PAC OT "6 Clicks" Daily Activity     Outcome Measure Help from another person eating meals?: A Little Help from another person taking care of personal grooming?: A Little Help from another person toileting, which includes using toliet, bedpan, or urinal?: A Lot Help from another person bathing (including washing, rinsing, drying)?: A Little Help from another person to put on and taking off regular upper body clothing?: A Lot Help from another person to put on and taking off regular lower body clothing?: A Little 6 Click Score: 16    End of Session Equipment Utilized During Treatment: Rolling walker (2 wheels) Nurse Communication: Mobility status  Activity Tolerance: Patient tolerated treatment well Patient left: in bed;with call bell/phone within reach;with bed alarm set  OT Visit Diagnosis: Unsteadiness on feet (R26.81);Other abnormalities of gait and mobility (R26.89);Muscle weakness (generalized) (M62.81)                Time: 9147-8295 OT Time Calculation (min): 32 min Charges:  OT General Charges $OT Visit: 1 Visit OT Evaluation $OT Eval Moderate Complexity: 1 Mod OT Treatments $Self Care/Home Management : 8-22 mins  Perrin Maltese, OTR/L Acute Rehabilitation Services  Office [phone] 10/08/2023

## 2023-10-08 NOTE — Progress Notes (Signed)
Transition of Care [facility]) - Inpatient Brief Assessment   Patient Details  Name: [name] MRN: [id number] Date of Birth: Sep 24, 1927  Transition of Care [facility]) CM/SW Contact:    Oletta Cohn, RN Phone Number: 10/08/2023, 10:26 AM   Clinical Narrative: RNCM met with pt at bedside regarding discharge planning.  Pt plans to return to [facility] to continue rehab and ultimately return home.  Transition of Care Asessment: Insurance and Status: Insurance coverage has been reviewed Patient has primary care physician: Yes Home environment has been reviewed: From [facility] Prior level of function:: (P) limited assistance Prior/Current Home Services: (P)  (N/A) Social Drivers of Health Review: (P) SDOH reviewed no interventions necessary Readmission risk has been reviewed: (P) Yes Transition of care needs: (P) transition of care needs identified, TOC will continue to follow

## 2023-10-08 NOTE — ED Notes (Signed)
MOST form given to [facility] transport staff.

## 2023-10-08 NOTE — Discharge Summary (Signed)
Physician Discharge Summary   Patient: [name] MRN: [id number] DOB: 09/04/1927  Admit date:     10/07/2023  Discharge date: 10/08/23  Discharge Physician: [name]   PCP: Bradley Rochester, MD   Recommendations at discharge:    PCP follow up in 1 week with repeat BMP. Cardiology follow up as scheduled.  Discharge Diagnoses: Principal Problem:   Acute kidney injury superimposed on chronic kidney disease (HCC) Active Problems:   Essential hypertension   Coronary artery disease involving native coronary artery of native heart with angina pectoris (HCC)   HLD (hyperlipidemia)   Atrial fibrillation with slow ventricular response (HCC)   Anemia due to chronic kidney disease   Hypothyroid  Resolved Problems:   * No resolved hospital problems. *  Hospital Course: [name] is a 89 y.o. male with medical history significant for CAD s/p DES to RCA 2017 and ramus 2018, permanent atrial fibrillation with slow VR not on AC due to frequent falls, chronic HFpEF (EF 55-60%), severe mitral regurgitation, history of CVA, s/p left CEA, CKD stage IIIa, HTN, HLD, hypothyroidism admitted with AKI after a fall at SNF. Recently admitted at [facility] 2/10-2/15 for acute on chronic HFpEF with possible superimposed pneumonia.  Assessment and Plan: Acute kidney injury superimposed on CKD stage IIIa: I decreased his Lasix dose to 20mg  daily. Repeat BMP in 1 week with PCP.   Fall at [facility], question of syncope versus near syncope: He has chronic bradycardia related to slow A-fib, baseline HR in 30s-40s.  Low suspicion of true syncope or symptomatic bradycardia. Continue rehab at facility.   Permanent atrial fibrillation with slow VR, chronic: Chronic slow A-fib, heart rate at baseline 30s-40s.  Drops into the 30s while sleeping.  He is not on beta-blocker.  Not on full dose anticoagulation given history of frequent falls.  Continue Plavix.   CAD s/p DES to RCA 2017 and ramus  2018: Stable, denies chest pain.  Continue Plavix, Imdur, Ranexa.   Hypertension: Continue amlodipine, Imdur.   Hyperlipidemia: Continue simvastatin.   Hypothyroidism: Continue Synthroid.   Anemia of chronic disease: Hemoglobin stable at 10.0. Outpatient follow up.       Consultants: none Procedures performed: none  Disposition: Skilled nursing facility Diet recommendation:  Discharge Diet Orders (From admission, onward)     Start     Ordered   10/08/23 0000  Diet - low sodium heart healthy        10/08/23 0958           Cardiac diet DISCHARGE MEDICATION: Allergies as of 10/08/2023       Reactions   Beta Adrenergic Blockers Other (See Comments)   Hx of AV block. Should avoid        Medication List     TAKE these medications    acetaminophen 500 MG tablet Commonly known as: TYLENOL Take 1 tablet (500 mg total) by mouth every 6 (six) hours as needed for moderate pain (wrist).   allopurinol 100 MG tablet Commonly known as: ZYLOPRIM Take 100 mg by mouth daily.   amLODipine 10 MG tablet Commonly known as: NORVASC Take 1 tablet (10 mg total) by mouth daily.   ascorbic acid 500 MG tablet Commonly known as: VITAMIN C Take 500 mg by mouth daily.   b complex vitamins tablet Take 1 tablet by mouth daily.   CALCIUM 500 PO Take 500 mg by mouth daily.   clopidogrel 75 MG tablet Commonly known as: PLAVIX Take 1 tablet (  75 mg total) by mouth daily.   colchicine 0.6 MG tablet Take 0.6 mg by mouth daily as needed (Gout flare up).   empagliflozin 10 MG Tabs tablet Commonly known as: JARDIANCE Take 1 tablet (10 mg total) by mouth daily.   ferrous sulfate 325 (65 FE) MG tablet Take 325 mg by mouth 2 (two) times daily with a meal.   finasteride 5 MG tablet Commonly known as: PROSCAR Take 5 mg by mouth daily.   Fish Oil 1000 MG Caps Take 1,000 mg by mouth daily.   folic acid 1 MG tablet Commonly known as: FOLVITE Take 1 tablet (1 mg total) by  mouth daily.   furosemide 40 MG tablet Commonly known as: LASIX Take 0.5 tablets (20 mg total) by mouth daily. What changed: how much to take   Glucosamine 500 MG Caps Take 500 mg by mouth daily.   irbesartan 75 MG tablet Commonly known as: AVAPRO Take 1 tablet (75 mg total) by mouth daily.   isosorbide mononitrate 60 MG 24 hr tablet Commonly known as: IMDUR Take 1 tablet (60 mg total) by mouth daily.   levothyroxine 25 MCG tablet Commonly known as: SYNTHROID Take 25 mcg by mouth daily.   lidocaine 4 % Commonly known as: HM Lidocaine Patch Place 1 patch onto the skin daily.   multivitamin capsule Take 1 capsule by mouth daily.   nitroGLYCERIN 0.4 MG SL tablet Commonly known as: NITROSTAT PLACE 1 TABLET UNDER THE TONGUE EVERY 5 MINUTES AS NEEDED FOR CHEST PAIN. NOT TO EXCEED 3 DOSES   polyethylene glycol 17 g packet Commonly known as: MiraLax Take 17 g by mouth daily.   ranolazine 1000 MG SR tablet Commonly known as: RANEXA TAKE 1 TABLET TWICE A DAY   sertraline 25 MG tablet Commonly known as: ZOLOFT Take 25 mg by mouth daily.   simvastatin 20 MG tablet Commonly known as: ZOCOR TAKE 1 TABLET DAILY   TUMS PO Take 1 tablet by mouth as needed (heartburn).   Vitamin D-3 25 MCG (1000 UT) Caps Take 1,000 Units by mouth daily.   zinc sulfate (50mg  elemental zinc) 220 (50 Zn) MG capsule Take 1 capsule (220 mg total) by mouth daily.        Follow-up Information     Bradley Rochester, MD Follow up in 1 week(s).   Specialty: Family Medicine Contact information: [address] [place] [address] [phone]         Bradley Stade, MD Follow up in 2 week(s).   Specialty: Cardiology Contact information: [address] [place] [address] [phone]                Discharge Exam: [name] Weights   10/07/23 2037  Weight: 60.8 kg      10/08/2023    7:30 AM 10/08/2023    7:00 AM 10/08/2023    6:30 AM   Vitals with BMI  Systolic 133 140 528  Diastolic 58 88 61  Pulse 37 38 36    General - Elderly Caucasian male, no apparent distress HEENT - PERRLA, EOMI, atraumatic head, non tender sinuses. Lung - Clear, no rales, rhonchi, wheezes. Heart - S1, S2 heard, bradycardia, no pedal edema. Abdomen - Soft, non tender, bowel sounds good Neuro - Alert, awake and oriented x 3, non focal exam. Skin - Warm and dry.  Condition at discharge: stable  The results of significant diagnostics from this hospitalization (including imaging, microbiology, ancillary and  laboratory) are listed below for reference.   Imaging Studies: DG Pelvis Portable Result Date: 10/07/2023 CLINICAL DATA:  Trauma, fall.  Pain. EXAM: PORTABLE PELVIS 1-2 VIEWS COMPARISON:  03/11/2023 FINDINGS: The cortical margins of the bony pelvis are intact. No fracture. Left hip arthroplasty which is intact. Pubic symphysis and sacroiliac joints are congruent. No hip arthroplasty dislocation. Mild right hip osteoarthritis. Vascular calcifications. IMPRESSION: No pelvic fracture.  Intact left hip arthroplasty. Electronically Signed   By: Narda Rutherford M.D.   On: 10/07/2023 21:21   DG Chest Port 1 View Result Date: 10/07/2023 CLINICAL DATA:  Trauma, fall. EXAM: PORTABLE CHEST 1 VIEW COMPARISON:  10/04/2023, CT 10/01/2023 FINDINGS: Chronic cardiomegaly. Unchanged mediastinal contours. Aortic atherosclerosis. Improved pleural effusions. No pneumothorax. No acute airspace disease. No displaced rib fracture or acute osseous findings. IMPRESSION: 1. No acute traumatic findings. 2. Chronic cardiomegaly. Improved pleural effusions. Electronically Signed   By: Narda Rutherford M.D.   On: 10/07/2023 21:19   CT HEAD WO CONTRAST ( ) Result Date: 10/07/2023 CLINICAL DATA:  Head trauma EXAM: CT HEAD WITHOUT CONTRAST CT CERVICAL SPINE WITHOUT CONTRAST TECHNIQUE: Multidetector CT imaging of the head and cervical spine was performed following the standard  protocol without intravenous contrast. Multiplanar CT image reconstructions of the cervical spine were also generated. RADIATION DOSE REDUCTION: This exam was performed according to the departmental dose-optimization program which includes automated exposure control, adjustment of the mA and/or kV according to patient size and/or use of iterative reconstruction technique. COMPARISON:  None Available. FINDINGS: CT HEAD FINDINGS Brain: There is no mass, hemorrhage or extra-axial collection. There is generalized atrophy without lobar predilection. Hypodensity of the white matter is most commonly associated with chronic microvascular disease. Vascular: Atherosclerotic calcification of the internal carotid arteries at the skull base. No abnormal hyperdensity of the major intracranial arteries or dural venous sinuses. Skull: The visualized skull base, calvarium and extracranial soft tissues are normal. Sinuses/Orbits: No fluid levels or advanced mucosal thickening of the visualized paranasal sinuses. No mastoid or middle ear effusion. Normal orbits. Other: None. CT CERVICAL SPINE FINDINGS Alignment: Grade 1 retrolisthesis at C3-4 and C4-5. Skull base and vertebrae: No acute fracture. Soft tissues and spinal canal: No prevertebral fluid or swelling. No visible canal hematoma. Disc levels: No advanced spinal canal or neural foraminal stenosis. Upper chest: No pneumothorax, pulmonary nodule or pleural effusion. Other: Aortic and carotid atherosclerosis. IMPRESSION: 1. No acute intracranial abnormality. 2. No acute fracture or traumatic subluxation of the cervical spine. Electronically Signed   By: Deatra Robinson M.D.   On: 10/07/2023 21:15   CT Cervical Spine Wo Contrast Result Date: 10/07/2023 CLINICAL DATA:  Head trauma EXAM: CT HEAD WITHOUT CONTRAST CT CERVICAL SPINE WITHOUT CONTRAST TECHNIQUE: Multidetector CT imaging of the head and cervical spine was performed following the standard protocol without intravenous  contrast. Multiplanar CT image reconstructions of the cervical spine were also generated. RADIATION DOSE REDUCTION: This exam was performed according to the departmental dose-optimization program which includes automated exposure control, adjustment of the mA and/or kV according to patient size and/or use of iterative reconstruction technique. COMPARISON:  None Available. FINDINGS: CT HEAD FINDINGS Brain: There is no mass, hemorrhage or extra-axial collection. There is generalized atrophy without lobar predilection. Hypodensity of the white matter is most commonly associated with chronic microvascular disease. Vascular: Atherosclerotic calcification of the internal carotid arteries at the skull base. No abnormal hyperdensity of the major intracranial arteries or dural venous sinuses. Skull: The visualized skull base, calvarium and extracranial soft tissues  are normal. Sinuses/Orbits: No fluid levels or advanced mucosal thickening of the visualized paranasal sinuses. No mastoid or middle ear effusion. Normal orbits. Other: None. CT CERVICAL SPINE FINDINGS Alignment: Grade 1 retrolisthesis at C3-4 and C4-5. Skull base and vertebrae: No acute fracture. Soft tissues and spinal canal: No prevertebral fluid or swelling. No visible canal hematoma. Disc levels: No advanced spinal canal or neural foraminal stenosis. Upper chest: No pneumothorax, pulmonary nodule or pleural effusion. Other: Aortic and carotid atherosclerosis. IMPRESSION: 1. No acute intracranial abnormality. 2. No acute fracture or traumatic subluxation of the cervical spine. Electronically Signed   By: Deatra Robinson M.D.   On: 10/07/2023 21:15   DG CHEST PORT 1 VIEW Result Date: 10/04/2023 CLINICAL DATA:  191478 Dyspnea on exertion [id number] EXAM: PORTABLE CHEST 1 VIEW COMPARISON:  10/01/2023 chest radiograph. FINDINGS: Right rotated chest radiograph. Stable cardiomediastinal silhouette with moderate cardiomegaly. No pneumothorax. Trace right pleural  effusion, decreased. No left pleural effusion. No overt pulmonary edema. No consolidative airspace disease. IMPRESSION: 1. Moderate cardiomegaly. No overt pulmonary edema. 2. Trace right pleural effusion, decreased. Electronically Signed   By: Delbert Phenix M.D.   On: 10/04/2023 12:21   ECHOCARDIOGRAM COMPLETE Result Date: 10/01/2023    ECHOCARDIOGRAM REPORT   Patient Name:   [name] Date of Exam: 10/01/2023 Medical Rec #:  [id number]    Height:       66.0 in Accession #:    [id number]   Weight:       146.4 lb Date of Birth:  1928/01/21    BSA:          1.751 m Patient Age:    89 years     BP:           138/73 mmHg Patient Gender: M            HR:           42 bpm. Exam Location:  Inpatient Procedure: 2D Echo, Cardiac Doppler and Color Doppler Indications:    Congestive Heart Failure I50.9  History:        Patient has prior history of Echocardiogram examinations, most                 recent 01/12/2016. Previous Myocardial Infarction,                 Arrythmias:Atrial Fibrillation, Signs/Symptoms:Chest Pain; Risk                 Factors:Hypertension.  Sonographer:    Webb Laws Referring Phys: [id number] KATY L FOUST IMPRESSIONS  1. Left ventricular ejection fraction, by estimation, is 55 to 60%. The left ventricle has normal function. The left ventricle has no regional wall motion abnormalities. There is mild concentric left ventricular hypertrophy. Left ventricular diastolic function could not be evaluated.  2. Right ventricular systolic function is moderately reduced. The right ventricular size is moderately enlarged. There is moderately elevated pulmonary artery systolic pressure. The estimated right ventricular systolic pressure is 56.7 mmHg.  3. Left atrial size was severely dilated.  4. Right atrial size was severely dilated.  5. The mitral valve is degenerative. Severe mitral valve regurgitation. No evidence of mitral stenosis.  6. The aortic valve is tricuspid. There is moderate calcification of the  aortic valve. There is moderate thickening of the aortic valve. Aortic valve regurgitation is mild. Aortic valve sclerosis/calcification is present, without any evidence of aortic stenosis.  7. The inferior vena cava is dilated in size with <50% respiratory  variability, suggesting right atrial pressure of 15 mmHg. FINDINGS  Left Ventricle: Left ventricular ejection fraction, by estimation, is 55 to 60%. The left ventricle has normal function. The left ventricle has no regional wall motion abnormalities. The left ventricular internal cavity size was normal in size. There is  mild concentric left ventricular hypertrophy. Left ventricular diastolic function could not be evaluated due to atrial fibrillation. Left ventricular diastolic function could not be evaluated. Right Ventricle: The right ventricular size is moderately enlarged. No increase in right ventricular wall thickness. Right ventricular systolic function is moderately reduced. There is moderately elevated pulmonary artery systolic pressure. The tricuspid  regurgitant velocity is 3.23 m/s, and with an assumed right atrial pressure of 15 mmHg, the estimated right ventricular systolic pressure is 56.7 mmHg. Left Atrium: Left atrial size was severely dilated. Right Atrium: Right atrial size was severely dilated. Pericardium: There is no evidence of pericardial effusion. Mitral Valve: The mitral valve is degenerative in appearance. Severe mitral valve regurgitation. No evidence of mitral valve stenosis. Tricuspid Valve: The tricuspid valve is grossly normal. Tricuspid valve regurgitation is mild . No evidence of tricuspid stenosis. Aortic Valve: The aortic valve is tricuspid. There is moderate calcification of the aortic valve. There is moderate thickening of the aortic valve. Aortic valve regurgitation is mild. Aortic regurgitation PHT measures 678 msec. Aortic valve sclerosis/calcification is present, without any evidence of aortic stenosis. Aortic valve peak  gradient measures 10.9 mmHg. Pulmonic Valve: The pulmonic valve was grossly normal. Pulmonic valve regurgitation is trivial. No evidence of pulmonic stenosis. Aorta: The aortic root is normal in size and structure. Venous: The inferior vena cava is dilated in size with less than 50% respiratory variability, suggesting right atrial pressure of 15 mmHg. IAS/Shunts: The atrial septum is grossly normal.  LEFT VENTRICLE PLAX 2D LVIDd:         5.40 cm     Diastology LVIDs:         2.50 cm     LV e' medial:    3.81 cm/s LV PW:         1.45 cm     LV E/e' medial:  28.9 LV IVS:        1.15 cm     LV e' lateral:   5.44 cm/s LVOT diam:     2.20 cm     LV E/e' lateral: 20.2 LV SV:         92 LV SV Index:   53 LVOT Area:     3.80 cm  LV Volumes (MOD) LV vol d, MOD A4C: 91.4 ml LV vol s, MOD A4C: 35.6 ml LV SV MOD A4C:     91.4 ml RIGHT VENTRICLE            IVC RV Basal diam:  4.40 cm    IVC diam: 2.40 cm RV S prime:     8.16 cm/s TAPSE (M-mode): 1.9 cm LEFT ATRIUM              Index         RIGHT ATRIUM           Index LA diam:        5.85 cm  3.34 cm/m    RA Area:     29.20 cm LA Vol (A2C):   102.0 ml 58.24 ml/m   RA Volume:   88.40 ml  50.47 ml/m LA Vol (A4C):   184.0 ml 105.06 ml/m LA Biplane Vol: 139.0 ml 79.36 ml/m  AORTIC VALVE AV Area (Vmax): 2.33 cm AV Vmax:        165.00 cm/s AV Peak Grad:   10.9 mmHg LVOT Vmax:      101.00 cm/s LVOT Vmean:     67.400 cm/s LVOT VTI:       0.243 m AI PHT:         678 msec  AORTA Ao Root diam: 3.70 cm MITRAL VALVE                  TRICUSPID VALVE MV Area (PHT): 4.49 cm       TR Peak grad:   41.7 mmHg MV Decel Time: 169 msec       TR Vmax:        323.00 cm/s MR Peak grad:    158.3 mmHg MR Mean grad:    87.5 mmHg    SHUNTS MR Vmax:         629.00 cm/s  Systemic VTI:  0.24 m MR Vmean:        429.5 cm/s   Systemic Diam: 2.20 cm MR PISA:         2.26 cm MR PISA Eff ROA: 12 mm MR PISA Radius:  0.60 cm MV E velocity: 110.00 cm/s MV A velocity: 61.20 cm/s MV E/A ratio:  1.80 Lennie Odor MD Electronically signed by Lennie Odor MD Signature Date/Time: 10/01/2023/3:06:28 PM    Final    CT Angio Chest PE W/Cm &/Or Wo Cm Result Date: 10/01/2023 CLINICAL DATA:  Weakness for 1 week. Stopped taking meds. Positive D-dimer. PE suspected. EXAM: CT ANGIOGRAPHY CHEST WITH CONTRAST TECHNIQUE: Multidetector CT imaging of the chest was performed using the standard protocol during bolus administration of intravenous contrast. Multiplanar CT image reconstructions and MIPs were obtained to evaluate the vascular anatomy. RADIATION DOSE REDUCTION: This exam was performed according to the departmental dose-optimization program which includes automated exposure control, adjustment of the mA and/or kV according to patient size and/or use of iterative reconstruction technique. CONTRAST:  75mL OMNIPAQUE IOHEXOL 350 MG/ML SOLN COMPARISON:  Same day chest radiograph and CTA 02/18/2023 FINDINGS: Cardiovascular: Marked cardiomegaly. No pericardial effusion. No pulmonary embolism. Dilated main pulmonary artery measuring 39 mm. Dilated ascending aorta measuring 49 mm in diameter. This has increased from 02/18/2023 when it measured 47 mm using similar measuring technique. Evaluation for dissection is limited due to contrast timing. Reflux of contrast into the IVC and hepatic veins compatible with elevated right heart pressures. Advanced coronary artery and aortic atherosclerotic calcification. Mediastinum/Nodes: Trachea and esophagus are unremarkable. No thoracic adenopathy. Lungs/Pleura: Large right and small left pleural effusions. Patchy ground-glass opacities bilaterally. Compressive atelectasis in the lower lobes, right middle lobe, and lingula. No pneumothorax. Upper Abdomen: No acute abnormality. Musculoskeletal: No acute fracture. Review of the MIP images confirms the above findings. IMPRESSION: 1. No pulmonary embolism. 2. Large right and small left pleural effusions with compressive atelectasis. 3. Patchy  ground-glass opacities bilaterally, likely edema. Atypical infection considered less likely though not excluded. 4. Marked cardiomegaly with dilated main pulmonary artery and reflux of contrast into the IVC and hepatic veins compatible with elevated right heart pressures and pulmonary arterial hypertension. 5. Dilated ascending aorta measuring 49 mm in diameter. This has increased from 02/18/2023 when it measured 47 mm using similar measuring technique. Ascending thoracic aortic aneurysm. Recommend semi-annual imaging followup by CTA or MRA and referral to cardiothoracic surgery if not already obtained. This recommendation follows 2010 ACCF/AHA/AATS/ACR/ASA/SCA/SCAI/SIR/STS/SVM Guidelines for the Diagnosis and Management of Patients  With Thoracic Aortic Disease. Circulation. 2010; 121: U045-W098. Aortic aneurysm NOS (ICD10-I71.9) 6. Aortic Atherosclerosis (ICD10-I70.0). Electronically Signed   By: Minerva Fester M.D.   On: 10/01/2023 02:53   DG Chest Port 1 View Result Date: 10/01/2023 CLINICAL DATA:  Shortness of breath and weakness EXAM: PORTABLE CHEST 1 VIEW COMPARISON:  03/11/2023 radiographs FINDINGS: Cardiomegaly. Coronary stenting. Aortic atherosclerotic calcification. Layering bilateral pleural effusions and associated airspace opacities. No pneumothorax. No displaced rib fractures. IMPRESSION: Layering bilateral pleural effusions and associated airspace opacities. Cardiomegaly. Electronically Signed   By: Minerva Fester M.D.   On: 10/01/2023 01:25   CT Head Wo Contrast Result Date: 09/30/2023 CLINICAL DATA:  Neuro deficit, acute, stroke suspected.  Weakness. EXAM: CT HEAD WITHOUT CONTRAST TECHNIQUE: Contiguous axial images were obtained from the base of the skull through the vertex without intravenous contrast. RADIATION DOSE REDUCTION: This exam was performed according to the departmental dose-optimization program which includes automated exposure control, adjustment of the mA and/or kV according  to patient size and/or use of iterative reconstruction technique. COMPARISON:  Head CT 03/11/2023 FINDINGS: Brain: There is no evidence of an acute infarct, intracranial hemorrhage, mass, midline shift, or extra-axial fluid collection. There is moderate cerebral atrophy. Cerebral white matter hypodensities are unchanged and nonspecific but compatible with mild-to-moderate chronic small vessel ischemic disease. 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 Vascular: Calcified atherosclerosis at the skull base. No hyperdense vessel. Skull: No acute fracture or suspicious osseous lesion. Sinuses/Orbits: Visualized paranasal sinuses and mastoid air cells are clear. Bilateral cataract extraction. Other: None. IMPRESSION: 1. No evidence of acute intracranial abnormality. 2. Cerebral atrophy and chronic small vessel ischemic disease. Electronically Signed   By: Sebastian Ache M.D.   On: 09/30/2023 16:32    Microbiology: Results for orders placed or performed during the hospital encounter of 09/30/23  Resp panel by RT-PCR (RSV, Flu A&B, Covid) Anterior Nasal Swab     Status: None   Collection Time: 09/30/23  3:56 PM   Specimen: Anterior Nasal Swab  Result Value Ref Range Status   SARS Coronavirus 2 by RT PCR NEGATIVE NEGATIVE Final    Comment: (NOTE) SARS-CoV-2 target nucleic acids are NOT DETECTED.  The SARS-CoV-2 RNA is generally detectable in upper respiratory specimens during the acute phase of infection. The lowest concentration of SARS-CoV-2 viral copies this assay can detect is 138 copies/mL. A negative result does not preclude SARS-Cov-2 infection and should not be used as the sole basis for treatment or other patient management decisions. A negative result may occur with  improper specimen collection/handling, submission of specimen other than nasopharyngeal swab, presence of viral mutation(s) within the areas targeted by this assay, and inadequate number of viral copies(<138  copies/mL). A negative result must be combined with clinical observations, patient history, and epidemiological information. The expected result is Negative.  Fact Sheet for Patients:  BloggerCourse.com  Fact Sheet for Healthcare Providers:  SeriousBroker.it  This test is no t yet approved or cleared by the [place] FDA and  has been authorized for detection and/or diagnosis of SARS-CoV-2 by FDA under an Emergency Use Authorization (EUA). This EUA will remain  in effect (meaning this test can be used) for the duration of the COVID-19 declaration under Section 564(b)(1) of the Act, 21 U.S.C.section 360bbb-3(b)(1), unless the authorization is terminated  or revoked sooner.       Influenza A by PCR NEGATIVE NEGATIVE Final   Influenza B by PCR NEGATIVE NEGATIVE Final    Comment: (NOTE) The  Xpert [employer] SARS-CoV-2/FLU/RSV plus assay is intended as an aid in the diagnosis of influenza from Nasopharyngeal swab specimens and should not be used as a sole basis for treatment. Nasal washings and aspirates are unacceptable for Xpert Xpress SARS-CoV-2/FLU/RSV testing.  Fact Sheet for Patients: BloggerCourse.com  Fact Sheet for Healthcare Providers: SeriousBroker.it  This test is not yet approved or cleared by the [place] FDA and has been authorized for detection and/or diagnosis of SARS-CoV-2 by FDA under an Emergency Use Authorization (EUA). This EUA will remain in effect (meaning this test can be used) for the duration of the COVID-19 declaration under Section 564(b)(1) of the Act, 21 U.S.C. section 360bbb-3(b)(1), unless the authorization is terminated or revoked.     Resp Syncytial Virus by PCR NEGATIVE NEGATIVE Final    Comment: (NOTE) Fact Sheet for Patients: BloggerCourse.com  Fact Sheet for Healthcare  Providers: SeriousBroker.it  This test is not yet approved or cleared by the [place] FDA and has been authorized for detection and/or diagnosis of SARS-CoV-2 by FDA under an Emergency Use Authorization (EUA). This EUA will remain in effect (meaning this test can be used) for the duration of the COVID-19 declaration under Section 564(b)(1) of the Act, 21 U.S.C. section 360bbb-3(b)(1), unless the authorization is terminated or revoked.  Performed at [facility], [address], [place], [place] [address]   MRSA Next Gen by PCR, Nasal     Status: None   Collection Time: 10/02/23  2:32 AM   Specimen: Nasal Mucosa; Nasal Swab  Result Value Ref Range Status   MRSA by PCR Next Gen NOT DETECTED NOT DETECTED Final    Comment: (NOTE) The GeneXpert MRSA Assay (FDA approved for NASAL specimens only), is one component of a comprehensive MRSA colonization surveillance program. It is not intended to diagnose MRSA infection nor to guide or monitor treatment for MRSA infections. Test performance is not FDA approved in patients less than 41 years old. Performed at [facility], [address] [place], [place] [address]     Labs: CBC: Recent Labs  Lab 10/02/23 0259 10/03/23 0615 10/04/23 0420 10/07/23 2041 10/08/23 0500  WBC 8.8 7.4 6.6 7.3 7.0  NEUTROABS  --   --   --  4.8  --   HGB 10.5* 10.2* 9.6* 10.0* 8.0*  HCT 33.1* 32.5* 29.9* 32.1* 25.0*  MCV 101.2* 100.6* 98.7 102.9* 99.2  PLT 144* 152 138* 164 163   Basic Metabolic Panel: Recent Labs  Lab 10/01/23 1235 10/02/23 0259 10/03/23 0615 10/04/23 0420 10/05/23 0353 10/07/23 2041 10/08/23 0500  NA 142 139 136 134* 133* 138 139  K 3.1* 4.3 4.1 4.3 4.5 4.9 4.7  CL 106 104 101 100 102 105 107  CO2 22 20* 21* 23 22 21* 19*  GLUCOSE 95 94 108* 99 94 116* 95  BUN 26* 36* 38* 45* 50* 49* 47*  CREATININE 1.00 1.20 1.24 1.36* 1.27* 1.64* 1.58*  CALCIUM 8.7*  8.7* 8.6* 8.6* 8.5* 8.7* 8.5*  MG 1.7 2.4  --  1.9 2.1  --   --    Liver Function Tests: Recent Labs  Lab 10/07/23 2041  AST 36  ALT 25  ALKPHOS 77  BILITOT 0.6  PROT 5.9*  ALBUMIN 3.2*   CBG: No results for input(s): "GLUCAP" in the last 168 hours.  Discharge time spent: 34 minutes.  Signed: Marcelino Duster, MD [facility] 10/08/2023

## 2023-10-11 ENCOUNTER — Inpatient Hospital Stay (HOSPITAL_COMMUNITY)
Admission: EM | Admit: 2023-10-11 | Discharge: 2023-10-19 | DRG: 951 | Disposition: E | Payer: Medicare Other | Attending: Internal Medicine | Admitting: Internal Medicine

## 2023-10-11 ENCOUNTER — Encounter (HOSPITAL_COMMUNITY): Payer: Self-pay

## 2023-10-11 ENCOUNTER — Other Ambulatory Visit: Payer: Self-pay

## 2023-10-11 ENCOUNTER — Emergency Department (HOSPITAL_COMMUNITY): Payer: Medicare Other

## 2023-10-11 DIAGNOSIS — I959 Hypotension, unspecified: Secondary | ICD-10-CM | POA: Diagnosis not present

## 2023-10-11 DIAGNOSIS — Z79890 Hormone replacement therapy (postmenopausal): Secondary | ICD-10-CM | POA: Diagnosis not present

## 2023-10-11 DIAGNOSIS — R55 Syncope and collapse: Secondary | ICD-10-CM | POA: Diagnosis present

## 2023-10-11 DIAGNOSIS — Z7902 Long term (current) use of antithrombotics/antiplatelets: Secondary | ICD-10-CM

## 2023-10-11 DIAGNOSIS — R680 Hypothermia, not associated with low environmental temperature: Secondary | ICD-10-CM | POA: Diagnosis present

## 2023-10-11 DIAGNOSIS — I2510 Atherosclerotic heart disease of native coronary artery without angina pectoris: Secondary | ICD-10-CM | POA: Diagnosis present

## 2023-10-11 DIAGNOSIS — R296 Repeated falls: Secondary | ICD-10-CM | POA: Diagnosis present

## 2023-10-11 DIAGNOSIS — Z8673 Personal history of transient ischemic attack (TIA), and cerebral infarction without residual deficits: Secondary | ICD-10-CM

## 2023-10-11 DIAGNOSIS — I25810 Atherosclerosis of coronary artery bypass graft(s) without angina pectoris: Secondary | ICD-10-CM | POA: Diagnosis present

## 2023-10-11 DIAGNOSIS — I130 Hypertensive heart and chronic kidney disease with heart failure and stage 1 through stage 4 chronic kidney disease, or unspecified chronic kidney disease: Secondary | ICD-10-CM | POA: Diagnosis present

## 2023-10-11 DIAGNOSIS — Z7901 Long term (current) use of anticoagulants: Secondary | ICD-10-CM | POA: Diagnosis not present

## 2023-10-11 DIAGNOSIS — Z7189 Other specified counseling: Secondary | ICD-10-CM

## 2023-10-11 DIAGNOSIS — Z66 Do not resuscitate: Secondary | ICD-10-CM | POA: Diagnosis present

## 2023-10-11 DIAGNOSIS — R627 Adult failure to thrive: Secondary | ICD-10-CM | POA: Diagnosis present

## 2023-10-11 DIAGNOSIS — Z515 Encounter for palliative care: Principal | ICD-10-CM

## 2023-10-11 DIAGNOSIS — E785 Hyperlipidemia, unspecified: Secondary | ICD-10-CM | POA: Diagnosis present

## 2023-10-11 DIAGNOSIS — E035 Myxedema coma: Secondary | ICD-10-CM | POA: Diagnosis present

## 2023-10-11 DIAGNOSIS — N179 Acute kidney failure, unspecified: Secondary | ICD-10-CM | POA: Diagnosis present

## 2023-10-11 DIAGNOSIS — I252 Old myocardial infarction: Secondary | ICD-10-CM

## 2023-10-11 DIAGNOSIS — H9190 Unspecified hearing loss, unspecified ear: Secondary | ICD-10-CM | POA: Diagnosis present

## 2023-10-11 DIAGNOSIS — Z8249 Family history of ischemic heart disease and other diseases of the circulatory system: Secondary | ICD-10-CM

## 2023-10-11 DIAGNOSIS — Z1152 Encounter for screening for COVID-19: Secondary | ICD-10-CM

## 2023-10-11 DIAGNOSIS — Z7984 Long term (current) use of oral hypoglycemic drugs: Secondary | ICD-10-CM | POA: Diagnosis not present

## 2023-10-11 DIAGNOSIS — I5030 Unspecified diastolic (congestive) heart failure: Secondary | ICD-10-CM | POA: Diagnosis not present

## 2023-10-11 DIAGNOSIS — N183 Chronic kidney disease, stage 3 unspecified: Secondary | ICD-10-CM | POA: Diagnosis present

## 2023-10-11 DIAGNOSIS — I4821 Permanent atrial fibrillation: Secondary | ICD-10-CM | POA: Diagnosis present

## 2023-10-11 DIAGNOSIS — I5032 Chronic diastolic (congestive) heart failure: Secondary | ICD-10-CM | POA: Diagnosis present

## 2023-10-11 DIAGNOSIS — Z79899 Other long term (current) drug therapy: Secondary | ICD-10-CM

## 2023-10-11 DIAGNOSIS — N1831 Chronic kidney disease, stage 3a: Secondary | ICD-10-CM | POA: Diagnosis present

## 2023-10-11 DIAGNOSIS — Z833 Family history of diabetes mellitus: Secondary | ICD-10-CM | POA: Diagnosis not present

## 2023-10-11 DIAGNOSIS — Z955 Presence of coronary angioplasty implant and graft: Secondary | ICD-10-CM

## 2023-10-11 DIAGNOSIS — R579 Shock, unspecified: Secondary | ICD-10-CM

## 2023-10-11 DIAGNOSIS — F0390 Unspecified dementia without behavioral disturbance: Secondary | ICD-10-CM | POA: Diagnosis present

## 2023-10-11 DIAGNOSIS — R001 Bradycardia, unspecified: Secondary | ICD-10-CM | POA: Diagnosis present

## 2023-10-11 DIAGNOSIS — Z961 Presence of intraocular lens: Secondary | ICD-10-CM | POA: Diagnosis present

## 2023-10-11 DIAGNOSIS — Z96642 Presence of left artificial hip joint: Secondary | ICD-10-CM | POA: Diagnosis present

## 2023-10-11 DIAGNOSIS — K219 Gastro-esophageal reflux disease without esophagitis: Secondary | ICD-10-CM | POA: Diagnosis present

## 2023-10-11 LAB — COMPREHENSIVE METABOLIC PANEL
ALT: 48 U/L — ABNORMAL HIGH (ref 0–44)
AST: 49 U/L — ABNORMAL HIGH (ref 15–41)
Albumin: 3 g/dL — ABNORMAL LOW (ref 3.5–5.0)
Alkaline Phosphatase: 94 U/L (ref 38–126)
Anion gap: 14 (ref 5–15)
BUN: 67 mg/dL — ABNORMAL HIGH (ref 8–23)
CO2: 19 mmol/L — ABNORMAL LOW (ref 22–32)
Calcium: 8.4 mg/dL — ABNORMAL LOW (ref 8.9–10.3)
Chloride: 103 mmol/L (ref 98–111)
Creatinine, Ser: 2.69 mg/dL — ABNORMAL HIGH (ref 0.61–1.24)
GFR, Estimated: 21 mL/min — ABNORMAL LOW (ref >60)
Glucose, Bld: 97 mg/dL (ref 70–99)
Potassium: 5 mmol/L (ref 3.5–5.1)
Sodium: 136 mmol/L (ref 135–145)
Total Bilirubin: 0.8 mg/dL (ref 0.0–1.2)
Total Protein: 5.8 g/dL — ABNORMAL LOW (ref 6.5–8.1)

## 2023-10-11 LAB — CBC
HCT: 27.6 % — ABNORMAL LOW (ref 39.0–52.0)
Hemoglobin: 8.9 g/dL — ABNORMAL LOW (ref 13.0–17.0)
MCH: 32.1 pg (ref 26.0–34.0)
MCHC: 32.2 g/dL (ref 30.0–36.0)
MCV: 99.6 fL (ref 80.0–100.0)
Platelets: 179 10*3/uL (ref 150–400)
RBC: 2.77 MIL/uL — ABNORMAL LOW (ref 4.22–5.81)
RDW: 16 % — ABNORMAL HIGH (ref 11.5–15.5)
WBC: 6.8 10*3/uL (ref 4.0–10.5)
nRBC: 0 % (ref 0.0–0.2)

## 2023-10-11 LAB — I-STAT CHEM 8, ED
BUN: 62 mg/dL — ABNORMAL HIGH (ref 8–23)
Calcium, Ion: 1.04 mmol/L — ABNORMAL LOW (ref 1.15–1.40)
Chloride: 107 mmol/L (ref 98–111)
Creatinine, Ser: 3.1 mg/dL — ABNORMAL HIGH (ref 0.61–1.24)
Glucose, Bld: 93 mg/dL (ref 70–99)
HCT: 27 % — ABNORMAL LOW (ref 39.0–52.0)
Hemoglobin: 9.2 g/dL — ABNORMAL LOW (ref 13.0–17.0)
Potassium: 5 mmol/L (ref 3.5–5.1)
Sodium: 137 mmol/L (ref 135–145)
TCO2: 20 mmol/L — ABNORMAL LOW (ref 22–32)

## 2023-10-11 LAB — ETHANOL: Alcohol, Ethyl (B): <10 mg/dL (ref <10)

## 2023-10-11 LAB — SAMPLE TO BLOOD BANK

## 2023-10-11 LAB — PROTIME-INR
INR: 1.3 — ABNORMAL HIGH (ref 0.8–1.2)
Prothrombin Time: 16.2 s — ABNORMAL HIGH (ref 11.4–15.2)

## 2023-10-11 LAB — T4, FREE: Free T4: 0.83 ng/dL (ref 0.61–1.12)

## 2023-10-11 LAB — I-STAT CG4 LACTIC ACID, ED: Lactic Acid, Venous: 1.8 mmol/L (ref 0.5–1.9)

## 2023-10-11 LAB — MAGNESIUM: Magnesium: 2.4 mg/dL (ref 1.7–2.4)

## 2023-10-11 LAB — TROPONIN I (HIGH SENSITIVITY): Troponin I (High Sensitivity): 46 ng/L — ABNORMAL HIGH (ref <18)

## 2023-10-11 LAB — TSH: TSH: 7.1 u[IU]/mL — ABNORMAL HIGH (ref 0.350–4.500)

## 2023-10-11 LAB — BRAIN NATRIURETIC PEPTIDE: B Natriuretic Peptide: 737.5 pg/mL — ABNORMAL HIGH (ref 0.0–100.0)

## 2023-10-11 MED ORDER — GLYCOPYRROLATE 1 MG PO TABS: 1.0000 mg | ORAL_TABLET | ORAL | Status: DC | PRN

## 2023-10-11 MED ORDER — SODIUM CHLORIDE 0.9 % IV BOLUS
500.0000 mL | Freq: Once | INTRAVENOUS | Status: AC
  Administered 2023-10-11: 500 mL via INTRAVENOUS

## 2023-10-11 MED ORDER — GLYCOPYRROLATE 0.2 MG/ML IJ SOLN: 0.2000 mg | INTRAMUSCULAR | Status: DC | PRN

## 2023-10-11 MED ORDER — MORPHINE BOLUS VIA INFUSION
5.0000 mg | INTRAVENOUS | Status: DC | PRN
  Administered 2023-10-11 (×2): 5 mg via INTRAVENOUS

## 2023-10-11 MED ORDER — DIPHENHYDRAMINE HCL 50 MG/ML IJ SOLN: 25.0000 mg | INTRAMUSCULAR | Status: DC | PRN

## 2023-10-11 MED ORDER — ACETAMINOPHEN 325 MG PO TABS: 650.0000 mg | ORAL_TABLET | Freq: Four times a day (QID) | ORAL | Status: DC | PRN

## 2023-10-11 MED ORDER — MORPHINE 100MG IN NS 100ML (1MG/ML) PREMIX INFUSION
0.0000 mg/h | INTRAVENOUS | Status: DC
  Administered 2023-10-11 (×2): 10 mg/h via INTRAVENOUS
  Administered 2023-10-11: 5 mg/h via INTRAVENOUS
  Administered 2023-10-12: 10 mg/h via INTRAVENOUS
  Filled 2023-10-11 (×3): qty 100

## 2023-10-11 MED ORDER — SODIUM CHLORIDE 0.9% FLUSH
3.0000 mL | Freq: Two times a day (BID) | INTRAVENOUS | Status: DC
  Administered 2023-10-11: 3 mL via INTRAVENOUS

## 2023-10-11 MED ORDER — ACETAMINOPHEN 650 MG RE SUPP: 650.0000 mg | Freq: Four times a day (QID) | RECTAL | Status: DC | PRN

## 2023-10-11 MED ORDER — MIDAZOLAM HCL 2 MG/2ML IJ SOLN
2.0000 mg | INTRAMUSCULAR | Status: DC | PRN
  Administered 2023-10-11: 2 mg via INTRAVENOUS
  Filled 2023-10-11: qty 2

## 2023-10-11 MED ORDER — MORPHINE SULFATE (PF) 2 MG/ML IV SOLN: 2.0000 mg | INTRAVENOUS | Status: DC | PRN

## 2023-10-11 MED ORDER — LEVOTHYROXINE SODIUM 100 MCG/5ML IV SOLN
100.0000 ug | Freq: Once | INTRAVENOUS | Status: DC
  Filled 2023-10-11: qty 5

## 2023-10-11 MED ORDER — NOREPINEPHRINE 4 MG/250ML-% IV SOLN
0.0000 ug/min | INTRAVENOUS | Status: DC
  Administered 2023-10-11: 10 ug/min via INTRAVENOUS

## 2023-10-11 MED ORDER — POLYVINYL ALCOHOL 1.4 % OP SOLN: 1.0000 [drp] | Freq: Four times a day (QID) | OPHTHALMIC | Status: DC | PRN

## 2023-10-11 NOTE — H&P (Signed)
History and Physical    Patient: [name]:[id number] DOB: 1928/07/17 DOA: 10/11/2023 DOS: the patient was seen and examined on 10/11/2023 PCP: Stevphen Rochester, MD  Patient coming from: Home Chief complaint: Chief Complaint  Patient presents with   Fall, Level 2 - bradycardic   HPI:  KAZIM CORRALES is a 89 y.o. male with past medical history  of bradycardia, congestive heart failure, AKI/CKD, CAD, A-fib on anticoagulation, anemia, hypothyroidism coming today brought by EMS for>> syncope, collapse, patient was lowered to the ground and per report has not hit the back of his head.  Per report patient has been declining slowly his heart failure has been progressing and was recently admitted and seen by cardiology.  Show presentation patient was hypothermic and bradycardic.  Initially patient was admitted to ICU service and started on Levophed drip.  Goals of care discussion per provider and family which then requested patient be comfort measures.  >>ED Course: In emergency room, no distress, blood pressure as below and bradycardic. Vitals:   10/11/23 1646 10/11/23 1647 10/11/23 1648 10/11/23 1715  BP:    (!) 85/49  Pulse: (!) 37 (!) 38 (!) 36 (!) 30  Temp:      Resp: 17 14 17 18   Height:      Weight:      SpO2: 100% 100% 100% 94%  TempSrc:      BMI (Calculated):       ED evaluation  so far shows: Metabolic panel shows bicarb of 19, AKI with a creatinine of 2.69 rising at 3.10 LFTs elevated mildly at 49 and 48 AST ALT respectively initial lactic acid of 1.8. CBC showing chronic anemia with hemoglobin of 8.9 WBC count of 2.7 normal platelets. TSH of 7.1 and free T4 of 0.83.   In the emergency room  pt has received the following treatment thus far: Medications  norepinephrine (LEVOPHED) 4mg  in (0.016 mg/mL) premix infusion (0 mcg/min Intravenous Stopped 10/11/23 1609)  levothyroxine (SYNTHROID, LEVOTHROID) injection 100 mcg (has no administration in time range)   acetaminophen (TYLENOL) tablet 650 mg (has no administration in time range)    Or  acetaminophen (TYLENOL) suppository 650 mg (has no administration in time range)  glycopyrrolate (ROBINUL) tablet 1 mg (has no administration in time range)    Or  glycopyrrolate (ROBINUL) injection 0.2 mg (has no administration in time range)    Or  glycopyrrolate (ROBINUL) injection 0.2 mg (has no administration in time range)  polyvinyl alcohol (LIQUIFILM TEARS) 1.4 % ophthalmic solution 1 drop (has no administration in time range)  morphine bolus via infusion 5 mg (has no administration in time range)  morphine 100mg  in NS (1mg /mL) infusion - premix (has no administration in time range)  midazolam (VERSED) injection 2-4 mg (has no administration in time range)  diphenhydrAMINE (BENADRYL) injection 25 mg (has no administration in time range)  sodium chloride 0.9 % bolus 500 mL (0 mLs Intravenous Stopped 10/11/23 1454)     Review of Systems  Unable to perform ROS: Acuity of condition   Past Medical History:  Diagnosis Date   Arthritis    AV BLOCK, 1ST DEGREE    Carotid artery disease (HCC)    a. s/p Left ECA followed by [name]   Coronary artery disease    Coronary artery disease involving coronary bypass graft of native heart with angina pectoris (HCC)    a. LHC 12/2015  3vd Left Cx 100% with colaterals, and distally LAD occluded, DES  to prox-mid RCA  b. 02/2017: repeat cath with patent RCA stent and stable CAD; 02/27/2017: Rota-PCI to Ost [place]   GERD (gastroesophageal reflux disease)    HLD (hyperlipidemia)    HTN (hypertension)    Hypothyroidism    Myocardial infarction (HCC)    20 years   NSVT (nonsustained ventricular tachycardia) (HCC)    during stress test 12/2015   Presence of drug coated stent in RCA & Ramus Intermedius. 02/28/2017   12/2015: PCI p-mRCA - Stent Resolute Integ 3.5x34 02/2017: Rota-PCI o-mRamus - overlapping DES STENT SYNERGY DES 3X32  & 3x24 (tapered post-dilation)    Stroke [facility])    Past Surgical History:  Procedure Laterality Date   CARDIAC CATHETERIZATION     x2   CARDIAC CATHETERIZATION N/A 01/11/2016   Procedure: Left Heart Cath and Coronary Angiography;  Surgeon: Iran Ouch, MD;  Location: [facility] INVASIVE CV LAB;  Service: Cardiovascular;  Laterality: N/A;   CAROTID ENDARTERECTOMY  11/28/2006   left   CATARACT EXTRACTION W/ INTRAOCULAR LENS IMPLANT Bilateral    CORONARY ATHERECTOMY N/A 02/27/2017   Procedure: Coronary Atherectomy;  Surgeon: Lennette Bihari, MD;  Location: [facility] INVASIVE CV LAB;  Service: Cardiovascular;  Laterality: N/A;   HERNIA REPAIR     LEFT HEART CATH AND CORONARY ANGIOGRAPHY N/A 02/21/2017   Procedure: Left Heart Cath and Coronary Angiography;  Surgeon: Runell Gess, MD;  Location: [facility] INVASIVE CV LAB;  Service: Cardiovascular;  Laterality: N/A;   TONSILLECTOMY     TOTAL HIP ARTHROPLASTY Left 11/13/2022   Procedure: LEFT TOTAL HIP ARTHROPLASTY ANTERIOR APPROACH;  Surgeon: Kathryne Hitch, MD;  Location: [facility] OR;  Service: Orthopedics;  Laterality: Left;    reports that he has never smoked. He has never used smokeless tobacco. He reports current alcohol use of about 7.0 standard drinks of alcohol per week. He reports that he does not use drugs.  Allergies  Allergen Reactions   Beta Adrenergic Blockers Other (See Comments)    Hx of AV block. Should avoid    Family History  Problem Relation Age of Onset   Diabetes Mother    Diabetes Sister    Cancer Sister 74       colon   Heart disease Sister     Prior to Admission medications   Medication Sig Start Date End Date Taking? Authorizing Provider  acetaminophen (TYLENOL) 500 MG tablet Take 1 tablet (500 mg total) by mouth every 6 (six) hours as needed for moderate pain (wrist). 02/28/17  Yes Laverda Page B, NP  allopurinol (ZYLOPRIM) 100 MG tablet Take 100 mg by mouth daily. 02/06/22  Yes [provider]  amLODipine (NORVASC) 10 MG tablet Take 1  tablet (10 mg total) by mouth daily. 10/05/23  Yes Leatha Gilding, MD  b complex vitamins tablet Take 1 tablet by mouth daily.   Yes [provider]  calcium carbonate (OS-CAL - DOSED IN MG OF ELEMENTAL CALCIUM) 1250 (500 Ca) MG tablet Take 1 tablet by mouth daily.   Yes [provider]  calcium carbonate (TUMS - DOSED IN MG ELEMENTAL CALCIUM) 500 MG chewable tablet Chew 1 tablet by mouth daily as needed for heartburn.   Yes [provider]  cholecalciferol (VITAMIN D3) 25 MCG (1000 UNIT) tablet Take 1,000 Units by mouth daily.   Yes [provider]  clopidogrel (PLAVIX) 75 MG tablet Take 1 tablet (75 mg total) by mouth daily. 04/10/23  Yes Wendall Stade, MD  colchicine 0.6 MG tablet Take  0.6 mg by mouth daily as needed (Gout flare up). 07/09/23  Yes [provider]  empagliflozin (JARDIANCE) 10 MG TABS tablet Take 1 tablet (10 mg total) by mouth daily. 10/05/23  Yes Leatha Gilding, MD  ferrous sulfate 325 (65 FE) MG tablet Take 325 mg by mouth 2 (two) times daily with a meal.   Yes [provider]  finasteride (PROSCAR) 5 MG tablet Take 5 mg by mouth daily. 07/09/23 07/08/24 Yes [provider]  folic acid (FOLVITE) 1 MG tablet Take 1 tablet (1 mg total) by mouth daily. 12/19/20  Yes Standley Brooking, MD  furosemide (LASIX) 20 MG tablet Take 20 mg by mouth daily.   Yes [provider]  Glucosamine 500 MG CAPS Take 500 mg by mouth daily.   Yes [provider]  irbesartan (AVAPRO) 75 MG tablet Take 1 tablet (75 mg total) by mouth daily. 10/05/23  Yes Leatha Gilding, MD  isosorbide mononitrate (IMDUR) 60 MG 24 hr tablet Take 1 tablet (60 mg total) by mouth daily. 10/05/23  Yes Leatha Gilding, MD  levothyroxine (SYNTHROID) 25 MCG tablet Take 25 mcg by mouth daily. 03/23/22  Yes [provider]  lidocaine (HM LIDOCAINE PATCH) 4 % Place 1 patch onto the skin daily. 02/18/23  Yes Small, Brooke L, PA  Multiple  Vitamin (MULTIVITAMIN) capsule Take 1 capsule by mouth daily.   Yes [provider]  nitroGLYCERIN (NITROSTAT) 0.4 MG SL tablet PLACE 1 TABLET UNDER THE TONGUE EVERY 5 MINUTES AS NEEDED FOR CHEST PAIN. NOT TO EXCEED 3 DOSES 06/02/20  Yes Wendall Stade, MD  Omega-3 Fatty Acids (FISH OIL) 1000 MG CAPS Take 1,000 mg by mouth daily.   Yes [provider]  polyethylene glycol (MIRALAX) 17 g packet Take 17 g by mouth daily. 02/18/23  Yes Small, Brooke L, PA  ranolazine (RANEXA) 1000 MG SR tablet TAKE 1 TABLET TWICE A DAY Patient taking differently: Take 1,000 mg by mouth 2 (two) times daily. 04/10/22  Yes Wendall Stade, MD  sertraline (ZOLOFT) 25 MG tablet Take 25 mg by mouth daily.   Yes [provider]  simvastatin (ZOCOR) 20 MG tablet TAKE 1 TABLET DAILY Patient taking differently: Take 20 mg by mouth daily. 11/21/22  Yes Wendall Stade, MD  vitamin C (ASCORBIC ACID) 500 MG tablet Take 500 mg by mouth daily.   Yes [provider]  zinc sulfate 220 (50 Zn) MG capsule Take 1 capsule (220 mg total) by mouth daily. 12/19/20  Yes Standley Brooking, MD     Vitals:   10/11/23 1646 10/11/23 1647 10/11/23 1648 10/11/23 1715  BP:    (!) 85/49  Pulse: (!) 37 (!) 38 (!) 36 (!) 30  Resp: 17 14 17 18   Temp:      TempSrc:      SpO2: 100% 100% 100% 94%  Weight:      Height:       Physical Exam Constitutional:      Appearance: He is ill-appearing.  HENT:     Head: Normocephalic.     Right Ear: External ear normal.     Left Ear: External ear normal.  Eyes:     Extraocular Movements: Extraocular movements intact.  Cardiovascular:     Rate and Rhythm: Regular rhythm. Bradycardia present.     Pulses: Normal pulses.  Pulmonary:     Breath sounds: Wheezing present.     Comments: Slight wheezes on both sides.  Abdominal:  General: There is no distension.     Tenderness: There is no abdominal tenderness. There is no guarding.  Musculoskeletal:     Right lower  leg: No edema.     Left lower leg: No edema.  Neurological:     Mental Status: He is unresponsive.     Comments: Sedated .       Labs on Admission: I have personally reviewed following labs and imaging studies Results for orders placed or performed during the hospital encounter of 10/11/23 (from the past 24 hours)  Comprehensive metabolic panel     Status: Abnormal   Collection Time: 10/11/23  1:33 PM  Result Value Ref Range   Sodium 136 135 - 145 mmol/L   Potassium 5.0 3.5 - 5.1 mmol/L   Chloride 103 98 - 111 mmol/L   CO2 19 (L) 22 - 32 mmol/L   Glucose, Bld 97 70 - 99 mg/dL   BUN 67 (H) 8 - 23 mg/dL   Creatinine, Ser 1.61 (H) 0.61 - 1.24 mg/dL   Calcium 8.4 (L) 8.9 - 10.3 mg/dL   Total Protein 5.8 (L) 6.5 - 8.1 g/dL   Albumin 3.0 (L) 3.5 - 5.0 g/dL   AST 49 (H) 15 - 41 U/L   ALT 48 (H) 0 - 44 U/L   Alkaline Phosphatase 94 38 - 126 U/L   Total Bilirubin 0.8 0.0 - 1.2 mg/dL   GFR, Estimated 21 (L) >60 mL/min   Anion gap 14 5 - 15  CBC     Status: Abnormal   Collection Time: 10/11/23  1:33 PM  Result Value Ref Range   WBC 6.8 4.0 - 10.5 K/uL   RBC 2.77 (L) 4.22 - 5.81 MIL/uL   Hemoglobin 8.9 (L) 13.0 - 17.0 g/dL   HCT 09.6 (L) 04.5 - 40.9 %   MCV 99.6 80.0 - 100.0 fL   MCH 32.1 26.0 - 34.0 pg   MCHC 32.2 30.0 - 36.0 g/dL   RDW 81.1 (H) 91.4 - 78.2 %   Platelets 179 150 - 400 K/uL   nRBC 0.0 0.0 - 0.2 %  Ethanol     Status: None   Collection Time: 10/11/23  1:33 PM  Result Value Ref Range   Alcohol, Ethyl (B) <10 <10 mg/dL  Protime-INR     Status: Abnormal   Collection Time: 10/11/23  1:33 PM  Result Value Ref Range   Prothrombin Time 16.2 (H) 11.4 - 15.2 seconds   INR 1.3 (H) 0.8 - 1.2  Troponin I (High Sensitivity)     Status: Abnormal   Collection Time: 10/11/23  1:33 PM  Result Value Ref Range   Troponin I (High Sensitivity) 46 (H) <18 ng/L  Brain natriuretic peptide     Status: Abnormal   Collection Time: 10/11/23  1:33 PM  Result Value Ref Range   B  Natriuretic Peptide 737.5 (H) 0.0 - 100.0 pg/mL  Magnesium     Status: None   Collection Time: 10/11/23  1:33 PM  Result Value Ref Range   Magnesium 2.4 1.7 - 2.4 mg/dL  TSH     Status: Abnormal   Collection Time: 10/11/23  1:33 PM  Result Value Ref Range   TSH 7.100 (H) 0.350 - 4.500 uIU/mL  T4, free     Status: None   Collection Time: 10/11/23  1:33 PM  Result Value Ref Range   Free T4 0.83 0.61 - 1.12 ng/dL  Sample to Blood Bank     Status: None  Collection Time: 10/11/23  1:38 PM  Result Value Ref Range   Blood Bank Specimen SAMPLE AVAILABLE FOR TESTING    Sample Expiration      10/14/2023,2359 Performed at [facility], [address] [place], [place] [address]   I-Stat Chem 8, ED     Status: Abnormal   Collection Time: 10/11/23  1:44 PM  Result Value Ref Range   Sodium 137 135 - 145 mmol/L   Potassium 5.0 3.5 - 5.1 mmol/L   Chloride 107 98 - 111 mmol/L   BUN 62 (H) 8 - 23 mg/dL   Creatinine, Ser 6.57 (H) 0.61 - 1.24 mg/dL   Glucose, Bld 93 70 - 99 mg/dL   Calcium, Ion 8.46 (L) 1.15 - 1.40 mmol/L   TCO2 20 (L) 22 - 32 mmol/L   Hemoglobin 9.2 (L) 13.0 - 17.0 g/dL   HCT 96.2 (L) 95.2 - 84.1 %  I-Stat Lactic Acid, ED     Status: None   Collection Time: 10/11/23  1:44 PM  Result Value Ref Range   Lactic Acid, Venous 1.8 0.5 - 1.9 mmol/L   Recent Results (from the past 720 hours)  Resp panel by RT-PCR (RSV, Flu A&B, Covid) Anterior Nasal Swab     Status: None   Collection Time: 09/30/23  3:56 PM   Specimen: Anterior Nasal Swab  Result Value Ref Range Status   SARS Coronavirus 2 by RT PCR NEGATIVE NEGATIVE Final    Comment: (NOTE) SARS-CoV-2 target nucleic acids are NOT DETECTED.  The SARS-CoV-2 RNA is generally detectable in upper respiratory specimens during the acute phase of infection. The lowest concentration of SARS-CoV-2 viral copies this assay can detect is 138 copies/mL. A negative result does not preclude SARS-Cov-2 infection and should not be used  as the sole basis for treatment or other patient management decisions. A negative result may occur with  improper specimen collection/handling, submission of specimen other than nasopharyngeal swab, presence of viral mutation(s) within the areas targeted by this assay, and inadequate number of viral copies(<138 copies/mL). A negative result must be combined with clinical observations, patient history, and epidemiological information. The expected result is Negative.  Fact Sheet for Patients:  BloggerCourse.com  Fact Sheet for Healthcare Providers:  SeriousBroker.it  This test is no t yet approved or cleared by the [place] FDA and  has been authorized for detection and/or diagnosis of SARS-CoV-2 by FDA under an Emergency Use Authorization (EUA). This EUA will remain  in effect (meaning this test can be used) for the duration of the COVID-19 declaration under Section 564(b)(1) of the Act, 21 U.S.C.section 360bbb-3(b)(1), unless the authorization is terminated  or revoked sooner.       Influenza A by PCR NEGATIVE NEGATIVE Final   Influenza B by PCR NEGATIVE NEGATIVE Final    Comment: (NOTE) The Xpert Xpress SARS-CoV-2/FLU/RSV plus assay is intended as an aid in the diagnosis of influenza from Nasopharyngeal swab specimens and should not be used as a sole basis for treatment. Nasal washings and aspirates are unacceptable for Xpert Xpress SARS-CoV-2/FLU/RSV testing.  Fact Sheet for Patients: BloggerCourse.com  Fact Sheet for Healthcare Providers: SeriousBroker.it  This test is not yet approved or cleared by the [place] FDA and has been authorized for detection and/or diagnosis of SARS-CoV-2 by FDA under an Emergency Use Authorization (EUA). This EUA will remain in effect (meaning this test can be used) for the duration of the COVID-19 declaration under Section 564(b)(1)  of the Act, 21  U.S.C. section 360bbb-3(b)(1), unless the authorization is terminated or revoked.     Resp Syncytial Virus by PCR NEGATIVE NEGATIVE Final    Comment: (NOTE) Fact Sheet for Patients: BloggerCourse.com  Fact Sheet for Healthcare Providers: SeriousBroker.it  This test is not yet approved or cleared by the [place] FDA and has been authorized for detection and/or diagnosis of SARS-CoV-2 by FDA under an Emergency Use Authorization (EUA). This EUA will remain in effect (meaning this test can be used) for the duration of the COVID-19 declaration under Section 564(b)(1) of the Act, 21 U.S.C. section 360bbb-3(b)(1), unless the authorization is terminated or revoked.  Performed at [facility], [address], [place], [place] [address]   MRSA Next Gen by PCR, Nasal     Status: None   Collection Time: 10/02/23  2:32 AM   Specimen: Nasal Mucosa; Nasal Swab  Result Value Ref Range Status   MRSA by PCR Next Gen NOT DETECTED NOT DETECTED Final    Comment: (NOTE) The GeneXpert MRSA Assay (FDA approved for NASAL specimens only), is one component of a comprehensive MRSA colonization surveillance program. It is not intended to diagnose MRSA infection nor to guide or monitor treatment for MRSA infections. Test performance is not FDA approved in patients less than 10 years old. Performed at [facility], [address], [place], [place] [address]    CBC:    Latest Ref Rng & Units 10/11/2023    1:44 PM 10/11/2023    1:33 PM 10/08/2023    5:00 AM  CBC  WBC 4.0 - 10.5 K/uL  6.8  7.0   Hemoglobin 13.0 - 17.0 g/dL 9.2  8.9  8.0   Hematocrit 39.0 - 52.0 % 27.0  27.6  25.0   Platelets 150 - 400 K/uL  179  163    Basic Metabolic Panel: Recent Labs  Lab 10/05/23 0353 10/07/23 2041 10/08/23 0500 10/11/23 1333 10/11/23 1344  NA 133* 138 139 136 137  K 4.5 4.9 4.7 5.0 5.0  CL 102 105  107 103 107  CO2 22 21* 19* 19*  --   GLUCOSE 94 116* 95 97 93  BUN 50* 49* 47* 67* 62*  CREATININE 1.27* 1.64* 1.58* 2.69* 3.10*  CALCIUM 8.5* 8.7* 8.5* 8.4*  --   MG 2.1  --   --  2.4  --    Creatinine: Lab Results  Component Value Date   CREATININE 3.10 (H) 10/11/2023   CREATININE 2.69 (H) 10/11/2023   CREATININE 1.58 (H) 10/08/2023   Liver Function Tests:    Latest Ref Rng & Units 10/11/2023    1:33 PM 10/07/2023    8:41 PM 10/01/2023   12:44 AM  Hepatic Function  Total Protein 6.5 - 8.1 g/dL 5.8  5.9  6.4   Albumin 3.5 - 5.0 g/dL 3.0  3.2  3.7   AST 15 - 41 U/L 49  36  51   ALT 0 - 44 U/L 48  25  29   Alk Phosphatase 38 - 126 U/L 94  77  90   Total Bilirubin 0.0 - 1.2 mg/dL 0.8  0.6  1.5   Bilirubin, Direct 0.0 - 0.2 mg/dL   0.4    Coagulation Profile: Recent Labs  Lab 10/11/23 1333  INR 1.3*   Radiological Exams on Admission: DG Pelvis Portable Result Date: 10/11/2023 CLINICAL DATA:  Trauma. Fall. Patient was lowered to the floor. Trauma to back of head. EXAM: PORTABLE PELVIS 1-2 VIEWS COMPARISON:  One-view pelvis 10/07/2023 FINDINGS: Left total hip arthroplasty is again noted. The hips are located bilaterally. No acute fractures are present. Extensive vascular calcifications are noted. IMPRESSION: 1. No acute fracture or dislocation. 2. Left total hip arthroplasty. Electronically Signed   By: Marin Roberts M.D.   On: 10/11/2023 15:04   DG Chest Port 1 View Result Date: 10/11/2023 CLINICAL DATA:  Trauma. Mechanical fall. Patient was lowered to the floor. Trauma to back of head. EXAM: PORTABLE CHEST 1 VIEW COMPARISON:  One-view chest x-ray 10/07/2023 FINDINGS: Heart is enlarged. Atherosclerotic calcifications are present at the aortic arch. No edema or effusion present. No focal airspace disease is present. The visualized soft tissues and bony thorax are unremarkable. IMPRESSION: 1. Cardiomegaly without failure. 2. No acute cardiopulmonary disease. Electronically  Signed   By: Marin Roberts M.D.   On: 10/11/2023 15:03   CT HEAD WO CONTRAST Result Date: 10/11/2023 CLINICAL DATA:  Head trauma, moderate-severe; Polytrauma, blunt. Syncopal episode during physical therapy. EXAM: CT HEAD WITHOUT CONTRAST CT CERVICAL SPINE WITHOUT CONTRAST TECHNIQUE: Multidetector CT imaging of the head and cervical spine was performed following the standard protocol without intravenous contrast. Multiplanar CT image reconstructions of the cervical spine were also generated. RADIATION DOSE REDUCTION: This exam was performed according to the departmental dose-optimization program which includes automated exposure control, adjustment of the mA and/or kV according to patient size and/or use of iterative reconstruction technique. COMPARISON:  CT head and cervical spine 10/07/2023. FINDINGS: CT HEAD FINDINGS Brain: No acute hemorrhage. 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 Cortical gray-white differentiation is otherwise preserved. Prominence of the ventricles and sulci within expected range for age. No hydrocephalus or extra-axial collection. No mass effect or midline shift. Vascular: No hyperdense vessel or unexpected calcification. Skull: No calvarial fracture or suspicious bone lesion. Skull base is unremarkable. Sinuses/Orbits: No acute finding. Other: None. CT CERVICAL SPINE FINDINGS Alignment: Unchanged degenerative reversal of the normal cervical lordosis. No traumatic malalignment. Skull base and vertebrae: No acute fracture. Normal craniocervical junction. No suspicious bone lesions. Unchanged severe disc height loss throughout the cervical spine with multilevel degenerative endplate changes. Soft tissues and spinal canal: No prevertebral fluid or swelling. No visible canal hematoma. Disc levels: Unchanged multilevel cervical spondylosis without high-grade spinal canal stenosis. Upper chest: Small  right pleural effusion. Other: Extensive atherosclerotic calcifications of the right carotid bulb. Surgical clips from prior left carotid endarterectomy. IMPRESSION: 1. No acute intracranial abnormality. 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 2. No acute cervical spine fracture or traumatic malalignment. 3. Small right pleural effusion. Electronically Signed   By: Orvan Falconer M.D.   On: 10/11/2023 14:15   CT CERVICAL SPINE WO CONTRAST Result Date: 10/11/2023 CLINICAL DATA:  Head trauma, moderate-severe; Polytrauma, blunt. Syncopal episode during physical therapy. EXAM: CT HEAD WITHOUT CONTRAST CT CERVICAL SPINE WITHOUT CONTRAST TECHNIQUE: Multidetector CT imaging of the head and cervical spine was performed following the standard protocol without intravenous contrast. Multiplanar CT image reconstructions of the cervical spine were also generated. RADIATION DOSE REDUCTION: This exam was performed according to the departmental dose-optimization program which includes automated exposure control, adjustment of the mA and/or kV according to patient size and/or use of iterative reconstruction technique. COMPARISON:  CT head and cervical spine 10/07/2023. FINDINGS: CT HEAD FINDINGS Brain: No acute hemorrhage. Stable background of moderate chronic small-vessel disease with old cortical infarct along the left middle  frontal gyrus. Cortical gray-white differentiation is otherwise preserved. Prominence of the ventricles and sulci within expected range for age. No hydrocephalus or extra-axial collection. No mass effect or midline shift. Vascular: No hyperdense vessel or unexpected calcification. Skull: No calvarial fracture or suspicious bone lesion. Skull base is unremarkable. Sinuses/Orbits: No acute finding. Other: None. CT CERVICAL SPINE FINDINGS Alignment: Unchanged degenerative reversal of the normal cervical lordosis. No traumatic  malalignment. Skull base and vertebrae: No acute fracture. Normal craniocervical junction. No suspicious bone lesions. Unchanged severe disc height loss throughout the cervical spine with multilevel degenerative endplate changes. Soft tissues and spinal canal: No prevertebral fluid or swelling. No visible canal hematoma. Disc levels: Unchanged multilevel cervical spondylosis without high-grade spinal canal stenosis. Upper chest: Small right pleural effusion. Other: Extensive atherosclerotic calcifications of the right carotid bulb. Surgical clips from prior left carotid endarterectomy. IMPRESSION: 1. No acute intracranial abnormality. 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 2. No acute cervical spine fracture or traumatic malalignment. 3. Small right pleural effusion. Electronically Signed   By: Orvan Falconer M.D.   On: 10/11/2023 14:15    Data Reviewed: Relevant notes from primary care and specialist visits, past discharge summaries as available in EHR, including Care Everywhere. Prior diagnostic testing as pertinent to current admission diagnoses, Updated medications and problem lists for reconciliation ED course, including vitals, labs, imaging, treatment and response to treatment,Triage notes, nursing and pharmacy notes and ED provider's notes Notable results as noted in HPI.Discussed case with EDMD/ ED APP/ or Specialty MD on call and as needed.  >>Assessment and Plan: * Syncope and collapse Combination of bradycardia and hypotension.   DNR (do not resuscitate) Code status DNR and DNI.   Acute kidney injury superimposed on chronic kidney disease (HCC) Due to hypoperfusion from bradycardia and heart failure.   Goals of care, counseling/discussion Due to pt's guarded condition his prognosis is guarded and terminal due to multiple comorbidity and bradycardia and acute kidney injury.  Family has decided to  proceed with comfort measures.   DVT prophylaxis:  N/A Consults:  None  Advance Care Planning:    Code Status: Limited: Do not attempt resuscitation (DNR) -DNR-LIMITED -Do Not Intubate/DNI    Family Communication:  Son at bedside.  Disposition Plan:  Home. Severity of Illness: The appropriate patient status for this patient is INPATIENT. Inpatient status is judged to be reasonable and necessary in order to provide the required intensity of service to ensure the patient's safety. The patient's presenting symptoms, physical exam findings, and initial radiographic and laboratory data in the context of their chronic comorbidities is felt to place them at high risk for further clinical deterioration. Furthermore, it is not anticipated that the patient will be medically stable for discharge from the hospital within 2 midnights of admission.   * I certify that at the point of admission it is my clinical judgment that the patient will require inpatient hospital care spanning beyond 2 midnights from the point of admission due to high intensity of service, high risk for further deterioration and high frequency of surveillance required.*  Author: Gertha Calkin, MD 10/11/2023 6:14 PM  For on call review [email or web address].   Unresulted Labs (From admission, onward)    None       Orders Placed This Encounter  Procedures   Critical Care   DG Chest Port 1 View   DG Pelvis Portable   CT HEAD WO CONTRAST   CT  CERVICAL SPINE WO CONTRAST   Comprehensive metabolic panel   CBC   Ethanol   Protime-INR   Brain natriuretic peptide   Magnesium   TSH   T4, free   Diet NPO time specified Except for: Other (See Comments)   ED Cardiac monitoring   Measure blood pressure   Check Rectal Temperature   Before discontinuing vasopressors, ensure that the patient has been extubated   Wean pressors once extubated: Decrease vasopressors to half (1/2) the current rate over 10 minutes, then wean to off over 10  minutes   Discontinue all previous orders, including vital signs, medications, enteral feeding, intravenous drips, blood sugar monitoring, radiographs, and laboratory tests. If TPN is running, discontinue the TPN after current bag and hang D5W at [facility]   Discontinue restraints   Remove devices not necessary for comfort, including monitors, hemodynamic lines (except ok to leave central line and foley), blood pressure cuffs, and leg compression sleeve(s)   If patient has implantable cardiac defibrillator (ICD), obtain magnet and place tape over ICD to disable device   RNs (two) may pronounce death   Maintain IV access   Vital signs   Notify physician (specify)   Refer to Sidebar Report Refer to ICU, Med-Surg, Progressive, and Step-Down Mobility Protocol Sidebars   Do not place and if present remove PureWick   RN may order General Admission PRN Orders utilizing "General Admission PRN medications" (through manage orders) for the following patient needs: allergy symptoms (Claritin), cold sores (Carmex), cough (Robitussin DM), eye irritation (Liquifilm Tears), hemorrhoids (Tucks), indigestion (Maalox), minor skin irritation (Hydrocortisone Cream), muscle pain Romeo Apple Gay), nose irritation (saline nasal spray) and sore throat (Chloraseptic spray).   Do not attempt resuscitation (DNR)- Limited -Do Not Intubate (DNI)   Consult to intensivist   Consult to hospitalist   Pulse oximetry check with vital signs   Oxygen therapy Mode or (Route): Nasal cannula; Liters Per Minute: 2; Keep O2 saturation between: greater than 92 %   I-Stat Chem 8, ED   I-Stat Lactic Acid, ED   EKG 12-Lead   EKG 12-Lead   Sample to Blood Bank   Admit to Inpatient (patient's expected length of stay will be greater than 2 midnights or inpatient only procedure)   Aspiration precautions   Fall precautions   Unrestricted visitation status

## 2023-10-11 NOTE — Assessment & Plan Note (Signed)
Due to hypoperfusion from bradycardia and heart failure.

## 2023-10-11 NOTE — Assessment & Plan Note (Addendum)
Due to pt's guarded condition his prognosis is guarded and terminal due to multiple comorbidity and bradycardia and acute kidney injury.  Family has decided to proceed with comfort measures.

## 2023-10-11 NOTE — Assessment & Plan Note (Signed)
Code status DNR and DNI.

## 2023-10-11 NOTE — ED Provider Notes (Signed)
[place] EMERGENCY DEPARTMENT AT [facility] Provider Note   CSN: [id number] Arrival date & time: 10/11/23  1327     History  Chief Complaint  Patient presents with  . Fall, Level 2 - bradycardic    [name] is a 89 y.o. male.  This is a 89 year old male presenting emergency department after a syncopal episode while at [facility] attempting physical therapy.  Reportedly was lowered to the floor, did not someone hit the back of his head.  Is on Plavix.  EMS noted patient bradycardic and round, but history of the same.  They note blood pressures in the 100.  Patient seemingly confused, but denies pain currently.        Home Medications Prior to Admission medications   Medication Sig Start Date End Date Taking? Authorizing Provider  acetaminophen (TYLENOL) 500 MG tablet Take 1 tablet (500 mg total) by mouth every 6 (six) hours as needed for moderate pain (wrist). 02/28/17  Yes Laverda Page B, NP  allopurinol (ZYLOPRIM) 100 MG tablet Take 100 mg by mouth daily. 02/06/22  Yes [provider]  amLODipine (NORVASC) 10 MG tablet Take 1 tablet (10 mg total) by mouth daily. 10/05/23  Yes Leatha Gilding, MD  b complex vitamins tablet Take 1 tablet by mouth daily.   Yes [provider]  calcium carbonate (OS-CAL - DOSED IN MG OF ELEMENTAL CALCIUM) 1250 (500 Ca) MG tablet Take 1 tablet by mouth daily.   Yes [provider]  calcium carbonate (TUMS - DOSED IN MG ELEMENTAL CALCIUM) 500 MG chewable tablet Chew 1 tablet by mouth daily as needed for heartburn.   Yes [provider]  cholecalciferol (VITAMIN D3) 25 MCG (1000 UNIT) tablet Take 1,000 Units by mouth daily.   Yes [provider]  clopidogrel (PLAVIX) 75 MG tablet Take 1 tablet (75 mg total) by mouth daily. 04/10/23  Yes Wendall Stade, MD  colchicine 0.6 MG tablet Take 0.6 mg by mouth daily as needed (Gout flare up). 07/09/23  Yes [provider]  empagliflozin  (JARDIANCE) 10 MG TABS tablet Take 1 tablet (10 mg total) by mouth daily. 10/05/23  Yes Leatha Gilding, MD  ferrous sulfate 325 (65 FE) MG tablet Take 325 mg by mouth 2 (two) times daily with a meal.   Yes [provider]  finasteride (PROSCAR) 5 MG tablet Take 5 mg by mouth daily. 07/09/23 07/08/24 Yes [provider]  folic acid (FOLVITE) 1 MG tablet Take 1 tablet (1 mg total) by mouth daily. 12/19/20  Yes Standley Brooking, MD  furosemide (LASIX) 20 MG tablet Take 20 mg by mouth daily.   Yes [provider]  Glucosamine 500 MG CAPS Take 500 mg by mouth daily.   Yes [provider]  irbesartan (AVAPRO) 75 MG tablet Take 1 tablet (75 mg total) by mouth daily. 10/05/23  Yes Leatha Gilding, MD  isosorbide mononitrate (IMDUR) 60 MG 24 hr tablet Take 1 tablet (60 mg total) by mouth daily. 10/05/23  Yes Leatha Gilding, MD  levothyroxine (SYNTHROID) 25 MCG tablet Take 25 mcg by mouth daily. 03/23/22  Yes [provider]  lidocaine (HM LIDOCAINE PATCH) 4 % Place 1 patch onto the skin daily. 02/18/23  Yes Small, Brooke L, PA  Multiple Vitamin (MULTIVITAMIN) capsule Take 1 capsule by mouth daily.   Yes [provider]  nitroGLYCERIN (NITROSTAT) 0.4 MG SL tablet PLACE 1 TABLET UNDER THE TONGUE EVERY 5 MINUTES AS  NEEDED FOR CHEST PAIN. NOT TO EXCEED 3 DOSES 06/02/20  Yes Wendall Stade, MD  Omega-3 Fatty Acids (FISH OIL) 1000 MG CAPS Take 1,000 mg by mouth daily.   Yes [provider]  polyethylene glycol (MIRALAX) 17 g packet Take 17 g by mouth daily. 02/18/23  Yes Small, Brooke L, PA  ranolazine (RANEXA) 1000 MG SR tablet TAKE 1 TABLET TWICE A DAY Patient taking differently: Take 1,000 mg by mouth 2 (two) times daily. 04/10/22  Yes Wendall Stade, MD  sertraline (ZOLOFT) 25 MG tablet Take 25 mg by mouth daily.   Yes [provider]  simvastatin (ZOCOR) 20 MG tablet TAKE 1 TABLET DAILY Patient taking differently: Take 20 mg by mouth  daily. 11/21/22  Yes Wendall Stade, MD  vitamin C (ASCORBIC ACID) 500 MG tablet Take 500 mg by mouth daily.   Yes [provider]  zinc sulfate 220 (50 Zn) MG capsule Take 1 capsule (220 mg total) by mouth daily. 12/19/20  Yes Standley Brooking, MD      Allergies    Beta adrenergic blockers    Review of Systems   Review of Systems  Physical Exam Updated Vital Signs BP (!) 138/56   Pulse (!) 47   Temp (S) (!) 95.8 F (35.4 C) (Rectal)   Resp (!) 24   Ht 5\' 6"  (1.676 m)   Wt 60.8 kg   SpO2 100%   BMI 21.63 kg/m  Physical Exam Vitals and nursing note reviewed.  Constitutional:      General: He is not in acute distress.    Appearance: He is not ill-appearing or toxic-appearing.  HENT:     Head: Normocephalic and atraumatic.     Mouth/Throat:     Mouth: Mucous membranes are dry.  Eyes:     Conjunctiva/sclera: Conjunctivae normal.  Cardiovascular:     Rate and Rhythm: Bradycardia present.     Pulses: Normal pulses.  Pulmonary:     Effort: Pulmonary effort is normal.     Breath sounds: Normal breath sounds.  Abdominal:     General: Abdomen is flat. There is no distension.     Palpations: Abdomen is soft.     Tenderness: There is no abdominal tenderness. There is no guarding or rebound.  Musculoskeletal:     Cervical back: Normal range of motion. No tenderness.     Comments: No bony tenderness.  Chest wall stable nontender.  Pelvis stable nontender.  No overt injuries identified on exam.  Skin:    General: Skin is warm and dry.     Capillary Refill: Capillary refill takes less than 2 seconds.  Neurological:     Mental Status: He is alert. He is disoriented.     Comments: Patient is disoriented, exam limited by his participation as he is hard of hearing.  Seemingly moving all extremities in a coordinated fashion.  Psychiatric:     Comments: Disoriented.     ED Results / Procedures / Treatments   Labs (all labs ordered are listed, but only abnormal results  are displayed) Labs Reviewed  COMPREHENSIVE METABOLIC PANEL - Abnormal; Notable for the following components:      Result Value   CO2 19 (*)    BUN 67 (*)    Creatinine, Ser 2.69 (*)    Calcium 8.4 (*)    Total Protein 5.8 (*)    Albumin 3.0 (*)    AST 49 (*)    ALT 48 (*)  GFR, Estimated 21 (*)    All other components within normal limits  CBC - Abnormal; Notable for the following components:   RBC 2.77 (*)    Hemoglobin 8.9 (*)    HCT 27.6 (*)    RDW 16.0 (*)    All other components within normal limits  PROTIME-INR - Abnormal; Notable for the following components:   Prothrombin Time 16.2 (*)    INR 1.3 (*)    All other components within normal limits  BRAIN NATRIURETIC PEPTIDE - Abnormal; Notable for the following components:   B Natriuretic Peptide 737.5 (*)    All other components within normal limits  TSH - Abnormal; Notable for the following components:   TSH 7.100 (*)    All other components within normal limits  I-STAT CHEM 8, ED - Abnormal; Notable for the following components:   BUN 62 (*)    Creatinine, Ser 3.10 (*)    Calcium, Ion 1.04 (*)    TCO2 20 (*)    Hemoglobin 9.2 (*)    HCT 27.0 (*)    All other components within normal limits  TROPONIN I (HIGH SENSITIVITY) - Abnormal; Notable for the following components:   Troponin I (High Sensitivity) 46 (*)    All other components within normal limits  ETHANOL  MAGNESIUM  T4, FREE  URINALYSIS, ROUTINE W REFLEX MICROSCOPIC  I-STAT CG4 LACTIC ACID, ED  SAMPLE TO BLOOD BANK  TROPONIN I (HIGH SENSITIVITY)    EKG EKG Interpretation Date/Time:  Friday October 11 2023 13:34:16 EST Ventricular Rate:  36 PR Interval:    QRS Duration:  111 QT Interval:  637 QTC Calculation: 493 R Axis:   26  Text Interpretation: Atrial fibrillation Probable anteroseptal infarct, old Borderline ST depression, anterolateral leads Confirmed by Estanislado Pandy [phone]) on 10/11/2023 2:40:03 PM  Radiology DG Pelvis  Portable Result Date: 10/11/2023 CLINICAL DATA:  Trauma. Fall. Patient was lowered to the floor. Trauma to back of head. EXAM: PORTABLE PELVIS 1-2 VIEWS COMPARISON:  One-view pelvis 10/07/2023 FINDINGS: Left total hip arthroplasty is again noted. The hips are located bilaterally. No acute fractures are present. Extensive vascular calcifications are noted. IMPRESSION: 1. No acute fracture or dislocation. 2. Left total hip arthroplasty. Electronically Signed   By: Marin Roberts M.D.   On: 10/11/2023 15:04   DG Chest Port 1 View Result Date: 10/11/2023 CLINICAL DATA:  Trauma. Mechanical fall. Patient was lowered to the floor. Trauma to back of head. EXAM: PORTABLE CHEST 1 VIEW COMPARISON:  One-view chest x-ray 10/07/2023 FINDINGS: Heart is enlarged. Atherosclerotic calcifications are present at the aortic arch. No edema or effusion present. No focal airspace disease is present. The visualized soft tissues and bony thorax are unremarkable. IMPRESSION: 1. Cardiomegaly without failure. 2. No acute cardiopulmonary disease. Electronically Signed   By: Marin Roberts M.D.   On: 10/11/2023 15:03   CT HEAD WO CONTRAST Result Date: 10/11/2023 CLINICAL DATA:  Head trauma, moderate-severe; Polytrauma, blunt. Syncopal episode during physical therapy. EXAM: CT HEAD WITHOUT CONTRAST CT CERVICAL SPINE WITHOUT CONTRAST TECHNIQUE: Multidetector CT imaging of the head and cervical spine was performed following the standard protocol without intravenous contrast. Multiplanar CT image reconstructions of the cervical spine were also generated. RADIATION DOSE REDUCTION: This exam was performed according to the departmental dose-optimization program which includes automated exposure control, adjustment of the mA and/or kV according to patient size and/or use of iterative reconstruction technique. COMPARISON:  CT head and cervical spine 10/07/2023. FINDINGS: CT HEAD FINDINGS Brain: No  acute hemorrhage. Stable background of  moderate chronic small-vessel disease with old cortical infarct along the left middle frontal gyrus. Cortical gray-white differentiation is otherwise preserved. Prominence of the ventricles and sulci within expected range for age. No hydrocephalus or extra-axial collection. No mass effect or midline shift. Vascular: No hyperdense vessel or unexpected calcification. Skull: No calvarial fracture or suspicious bone lesion. Skull base is unremarkable. Sinuses/Orbits: No acute finding. Other: None. CT CERVICAL SPINE FINDINGS Alignment: Unchanged degenerative reversal of the normal cervical lordosis. No traumatic malalignment. Skull base and vertebrae: No acute fracture. Normal craniocervical junction. No suspicious bone lesions. Unchanged severe disc height loss throughout the cervical spine with multilevel degenerative endplate changes. Soft tissues and spinal canal: No prevertebral fluid or swelling. No visible canal hematoma. Disc levels: Unchanged multilevel cervical spondylosis without high-grade spinal canal stenosis. Upper chest: Small right pleural effusion. Other: Extensive atherosclerotic calcifications of the right carotid bulb. Surgical clips from prior left carotid endarterectomy. IMPRESSION: 1. No acute intracranial abnormality. 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 2. No acute cervical spine fracture or traumatic malalignment. 3. Small right pleural effusion. Electronically Signed   By: Orvan Falconer M.D.   On: 10/11/2023 14:15   CT CERVICAL SPINE WO CONTRAST Result Date: 10/11/2023 CLINICAL DATA:  Head trauma, moderate-severe; Polytrauma, blunt. Syncopal episode during physical therapy. EXAM: CT HEAD WITHOUT CONTRAST CT CERVICAL SPINE WITHOUT CONTRAST TECHNIQUE: Multidetector CT imaging of the head and cervical spine was performed following the standard protocol without intravenous contrast. Multiplanar CT image  reconstructions of the cervical spine were also generated. RADIATION DOSE REDUCTION: This exam was performed according to the departmental dose-optimization program which includes automated exposure control, adjustment of the mA and/or kV according to patient size and/or use of iterative reconstruction technique. COMPARISON:  CT head and cervical spine 10/07/2023. FINDINGS: CT HEAD FINDINGS Brain: No acute hemorrhage. 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 Cortical gray-white differentiation is otherwise preserved. Prominence of the ventricles and sulci within expected range for age. No hydrocephalus or extra-axial collection. No mass effect or midline shift. Vascular: No hyperdense vessel or unexpected calcification. Skull: No calvarial fracture or suspicious bone lesion. Skull base is unremarkable. Sinuses/Orbits: No acute finding. Other: None. CT CERVICAL SPINE FINDINGS Alignment: Unchanged degenerative reversal of the normal cervical lordosis. No traumatic malalignment. Skull base and vertebrae: No acute fracture. Normal craniocervical junction. No suspicious bone lesions. Unchanged severe disc height loss throughout the cervical spine with multilevel degenerative endplate changes. Soft tissues and spinal canal: No prevertebral fluid or swelling. No visible canal hematoma. Disc levels: Unchanged multilevel cervical spondylosis without high-grade spinal canal stenosis. Upper chest: Small right pleural effusion. Other: Extensive atherosclerotic calcifications of the right carotid bulb. Surgical clips from prior left carotid endarterectomy. IMPRESSION: 1. No acute intracranial abnormality. 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 2. No acute cervical spine fracture or traumatic malalignment. 3. Small right pleural effusion. Electronically Signed   By:  Orvan Falconer M.D.   On: 10/11/2023 14:15    Procedures .Critical Care  Performed by: Coral Spikes, DO Authorized by: Coral Spikes, DO   Critical care provider statement:    Critical care time (minutes):  30   Critical care was necessary to treat or prevent imminent or life-threatening deterioration of the following conditions:  Cardiac failure, shock and trauma  Critical care was time spent personally by me on the following activities:  Development of treatment plan with patient or surrogate, discussions with consultants, evaluation of patient's response to treatment, examination of patient, ordering and review of laboratory studies, ordering and review of radiographic studies, ordering and performing treatments and interventions, pulse oximetry, re-evaluation of patient's condition and review of old charts     Medications Ordered in ED Medications  norepinephrine (LEVOPHED) 4mg  in (0.016 mg/mL) premix infusion (11 mcg/min Intravenous Infusion Verify 10/11/23 1508)  levothyroxine (SYNTHROID, LEVOTHROID) injection 100 mcg (has no administration in time range)  sodium chloride 0.9 % bolus 500 mL (0 mLs Intravenous Stopped 10/11/23 1454)    ED Course/ Medical Decision Making/ A&P Clinical Course as of 10/11/23 1604  Fri Oct 11, 2023  1334 Admitted 2/10 for CHF per chart review. PMH per d/c summary: " CAD s/p DES to RCA in 2017 and DES to ramus intermedius in 2018, paroxysmal atrial fibrillation not on anticoagulation due to falls, carotid artery disease s/p left CEA, CVA, hypertension, hyperlipidemia, hypothyroidism, and hard of hearing" [TY]  1334 Appears bradycardic on prior ecgs as well rates appears to be mid 30s-40s.  [TY]  1351 Bedside US Fast exam performed by myself negative.  [TY]  1351 Creatinine(!): 3.10 Aki. IVFs ordered [TY]  1426 CT HEAD WO CONTRAST IMPRESSION: 1. No acute intracranial abnormality. Stable background of moderate chronic small-vessel disease  and old cortical infarct along the left middle frontal gyrus. 2. No acute cervical spine fracture or traumatic malalignment. 3. Small right pleural effusion.   [TY]  1426 CT CERVICAL SPINE WO CONTRAST IMPRESSION: 1. No acute intracranial abnormality. 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 2. No acute cervical spine fracture or traumatic malalignment. 3. Small right pleural effusion.   Electronically Signed   [TY]  [id number] Discussed with family at bedside and POA over the phone regarding CODE STATUS as patient's blood pressure has deteriorated.  Requiring Levophed for blood pressure support.  Respiratory placing a line.  Full code at the moment.  Labs with no leukocytosis to suggest systemic infection.  Lactate not significantly elevated.  BNP low, does appear to have AKI based on i-STAT.  TSH is quite elevated compared to prior.  Concern for myxedema coma; will give empiric dose of levothyroxine.  Cardiology consulted regarding patient's bradycardia possible pacemaker placement.  ICU consulted for admission given patient on Levophed.  Blood pressure has improved with levo. [TY]  [id number] ICU evaluated patient, after their discussion with family decision made to be made comfort care.  Recommending hospitalist admission. [TY]    Clinical Course User Index [TY] Coral Spikes, DO                                 Medical Decision Making This is a 89 year old male presenting emergency department for syncopal episode.  He is hypothermic, bradycardic, soft blood pressures on arrival, but seemingly hemodynamically stable.  He is on baseline oxygen 2 L per EMS report.  They gave 500 mL of fluid prior to arrival.  No overt signs of trauma CT head negative C-spine negative.  Chest x-ray without pneumothorax pelvis negative.  EMS did report that he was seemingly lowered to the ground.  He is seemingly altered, but family  presented later to bedside and stated that he is seemingly at his new norm normal.  Had a long discussion regarding CODE STATUS, for now he is to remain full code.  Labs with no sign of infection.  Stable anemia.  Appears to have AKI.  TSH elevated, given his hypothermia, bradycardia and hypotension concern for hypothyroid/myxedema coma.  He was started on Levophed for blood pressure support.  Levothyroxine ordered empirically.  Case discussed with ICU will see patient.  Cardiology also consulted, recommending correcting metabolic derangements at this time before consideration of pacemaker.  Amount and/or Complexity of Data Reviewed Labs: ordered. Decision-making details documented in ED Course. Radiology: ordered. Decision-making details documented in ED Course. ECG/medicine tests: ordered.  Risk Prescription drug management. Decision regarding hospitalization. Decision not to resuscitate or to de-escalate care because of poor prognosis. Diagnosis or treatment significantly limited by social determinants of health.           Final Clinical Impression(s) / ED Diagnoses Final diagnoses:  Syncope and collapse  Myxedema coma (HCC)  Shock [facility])    Rx / DC Orders ED Discharge Orders     None         Coral Spikes, DO 10/11/23 1534

## 2023-10-11 NOTE — Consult Note (Signed)
NAME:  [name], MRN:  [id number], DOB:  04-17-1928, LOS: 0 ADMISSION DATE:  10/11/2023, CONSULTATION DATE:  10/11/23 REFERRING MD:  Maple Hudson - EM, CHIEF COMPLAINT:  hypotension, syncope    History of Present Illness:   89 yo M PMH Afib with slow VR (40s at baseline ), not anticoagulated w fq falls, CAD, hypothyroidism, dementia, FTT who presented to ED 2/21 after a syncopal episode at PT.  Notably he presented w pre-syncopal episode 2/17 and was admitted at that time. He had also been admitted 2/11-2/15 with CC weakness.  In the ED 2/21 he is a big hypothermic, is bradycardic to the 30-40s (baseline 40s) and is hypotensive. He was started on NE. Labs w Cr 2.7, elevated LFTs, Elevated BNP. TSH 7. No leukocytosis no LA.    PCCM is called for admission in this setting   Pertinent  Medical History  Bradycardia Frequent falls Afib  Hypothyroidism FTT  CAD CKD   Significant Hospital Events: Including procedures, antibiotic start and stop dates in addition to other pertinent events   2/21 syncopal event prompting ED presentation. Hypotensive hypothermic. Started on pressors. PCCM consulted   Interim History / Subjective:    Objective   Blood pressure (!) 83/50, pulse (!) 32, temperature (S) (!) 95.8 F (35.4 C), temperature source (S) Rectal, resp. rate 13, height 5\' 6"  (1.676 m), weight 60.8 kg, SpO2 100%.        Intake/Output Summary (Last 24 hours) at 10/11/2023 1519 Last data filed at 10/11/2023 1508 Gross per 24 hour  Intake 8.31 ml  Output --  Net 8.31 ml   Filed Weights   10/11/23 1337  Weight: 60.8 kg    Examination: General: chronically ill, frail, thin, elderly M, uncomfortable appearing  HENT: temporal muscle wasting. Anicteric sclera. Dry mm  Lungs: ctab  Cardiovascular: bradycardic, irregular  Abdomen: soft ndnt  Extremities: no acute joint deformity. Decr muscle mass  Neuro: awake. Pill rolling movements. Moving arms and legs spontaneously & 5/5. Not  oriented. Occasionally answers some questions GU: defer   Resolved Hospital Problem list     Assessment & Plan:   GOC DNR Encounter for palliative care Syncope Bradycardia Hypotension -- normal LA argues against true shock state however  Dementia FTT in adult CAD Permanent AF with slow VR (30-40s)  HfpEF Severe MVR  AKI on CKD III Elevated LFTs  -sounds like this (syncopal/near syncopal event) actually happened 2/17 which is why he came to the ED then. Wondering if this is related to orthostatic change, compounded by his poor intake and FTT.  -family is certain he would never want to do things like a pacemaker  -not anticoagulated given his frequent falls  P --see IPAL note 2/21 for discussion --  -transition to comfort care. I have placed orders.  -DNR -pleasure feeds  -rec admit to [facility]. Likely in-hospital death given presenting BP  -conveyed to EM physician    Best Practice (right click and "Reselect all SmartList Selections" daily)   Diet/type: pleasure feeds  DVT prophylaxis na Pressure ulcer(s): pressure ulcer assessment deferred  GI prophylaxis: N/A Lines: N/A Foley:  N/A Code Status:  DNR Last date of multidisciplinary goals of care discussion [2/21]  Labs   CBC: Recent Labs  Lab 10/07/23 2041 10/08/23 0500 10/11/23 1333 10/11/23 1344  WBC 7.3 7.0 6.8  --   NEUTROABS 4.8  --   --   --   HGB 10.0* 8.0* 8.9* 9.2*  HCT 32.1* 25.0* 27.6* 27.0*  MCV 102.9* 99.2 99.6  --   PLT 164 163 179  --     Basic Metabolic Panel: Recent Labs  Lab 10/05/23 0353 10/07/23 2041 10/08/23 0500 10/11/23 1344  NA 133* 138 139 137  K 4.5 4.9 4.7 5.0  CL 102 105 107 107  CO2 22 21* 19*  --   GLUCOSE 94 116* 95 93  BUN 50* 49* 47* 62*  CREATININE 1.27* 1.64* 1.58* 3.10*  CALCIUM 8.5* 8.7* 8.5*  --   MG 2.1  --   --   --    GFR: Estimated Creatinine Clearance: 12.8 mL/min (A) (by C-G formula based on SCr of 3.1 mg/dL (H)). Recent Labs  Lab 10/07/23 2041  10/08/23 0500 10/11/23 1333 10/11/23 1344  WBC 7.3 7.0 6.8  --   LATICACIDVEN  --   --   --  1.8    Liver Function Tests: Recent Labs  Lab 10/07/23 2041  AST 36  ALT 25  ALKPHOS 77  BILITOT 0.6  PROT 5.9*  ALBUMIN 3.2*   No results for input(s): "LIPASE", "AMYLASE" in the last 168 hours. No results for input(s): "AMMONIA" in the last 168 hours.  ABG    Component Value Date/Time   TCO2 20 (L) 10/11/2023 1344     Coagulation Profile: Recent Labs  Lab 10/11/23 1333  INR 1.3*    Cardiac Enzymes: No results for input(s): "CKTOTAL", "CKMB", "CKMBINDEX", "TROPONINI" in the last 168 hours.  HbA1C: Hgb A1c MFr Bld  Date/Time Value Ref Range Status  10/01/2023 12:35 PM 5.4 4.8 - 5.6 % Final    Comment:    (NOTE) Pre diabetes:          5.7%-6.4%  Diabetes:              >6.4%  Glycemic control for   <7.0% adults with diabetes   01/11/2016 07:01 PM 6.0 (H) 4.8 - 5.6 % Final    Comment:    (NOTE)         Pre-diabetes: 5.7 - 6.4         Diabetes: >6.4         Glycemic control for adults with diabetes: <7.0     CBG: No results for input(s): "GLUCAP" in the last 168 hours.  Review of Systems:   Unable to obtain given his dementia  Past Medical History:  He,  has a past medical history of Arthritis, AV BLOCK, 1ST DEGREE, Carotid artery disease (HCC), Coronary artery disease, Coronary artery disease involving coronary bypass graft of native heart with angina pectoris (HCC), GERD (gastroesophageal reflux disease), HLD (hyperlipidemia), HTN (hypertension), Hypothyroidism, Myocardial infarction [facility]), NSVT (nonsustained ventricular tachycardia) (HCC), Presence of drug coated stent in RCA & Ramus Intermedius. (02/28/2017), and Stroke (HCC).   Surgical History:   Past Surgical History:  Procedure Laterality Date   CARDIAC CATHETERIZATION     x2   CARDIAC CATHETERIZATION N/A 01/11/2016   Procedure: Left Heart Cath and Coronary Angiography;  Surgeon: Iran Ouch, MD;  Location: [facility] INVASIVE CV LAB;  Service: Cardiovascular;  Laterality: N/A;   CAROTID ENDARTERECTOMY  11/28/2006   left   CATARACT EXTRACTION W/ INTRAOCULAR LENS IMPLANT Bilateral    CORONARY ATHERECTOMY N/A 02/27/2017   Procedure: Coronary Atherectomy;  Surgeon: Lennette Bihari, MD;  Location: [facility] INVASIVE CV LAB;  Service: Cardiovascular;  Laterality: N/A;   HERNIA REPAIR     LEFT HEART CATH AND CORONARY ANGIOGRAPHY N/A 02/21/2017   Procedure: Left Heart Cath and Coronary  Angiography;  Surgeon: Runell Gess, MD;  Location: [facility] INVASIVE CV LAB;  Service: Cardiovascular;  Laterality: N/A;   TONSILLECTOMY     TOTAL HIP ARTHROPLASTY Left 11/13/2022   Procedure: LEFT TOTAL HIP ARTHROPLASTY ANTERIOR APPROACH;  Surgeon: Kathryne Hitch, MD;  Location: [facility] OR;  Service: Orthopedics;  Laterality: Left;     Social History:   reports that he has never smoked. He has never used smokeless tobacco. He reports current alcohol use of about 7.0 standard drinks of alcohol per week. He reports that he does not use drugs.   Family History:  His family history includes Cancer (age of onset: 44) in his sister; Diabetes in his mother and sister; Heart disease in his sister.   Allergies Allergies  Allergen Reactions   Beta Adrenergic Blockers Other (See Comments)    Hx of AV block. Should avoid     Home Medications  Prior to Admission medications   Medication Sig Start Date End Date Taking? Authorizing Provider  acetaminophen (TYLENOL) 500 MG tablet Take 1 tablet (500 mg total) by mouth every 6 (six) hours as needed for moderate pain (wrist). 02/28/17  Yes Laverda Page B, NP  allopurinol (ZYLOPRIM) 100 MG tablet Take 100 mg by mouth daily. 02/06/22  Yes [provider]  amLODipine (NORVASC) 10 MG tablet Take 1 tablet (10 mg total) by mouth daily. 10/05/23  Yes Leatha Gilding, MD  b complex vitamins tablet Take 1 tablet by mouth daily.   Yes [provider]   calcium carbonate (OS-CAL - DOSED IN MG OF ELEMENTAL CALCIUM) 1250 (500 Ca) MG tablet Take 1 tablet by mouth daily.   Yes [provider]  calcium carbonate (TUMS - DOSED IN MG ELEMENTAL CALCIUM) 500 MG chewable tablet Chew 1 tablet by mouth daily as needed for heartburn.   Yes [provider]  cholecalciferol (VITAMIN D3) 25 MCG (1000 UNIT) tablet Take 1,000 Units by mouth daily.   Yes [provider]  clopidogrel (PLAVIX) 75 MG tablet Take 1 tablet (75 mg total) by mouth daily. 04/10/23  Yes Wendall Stade, MD  colchicine 0.6 MG tablet Take 0.6 mg by mouth daily as needed (Gout flare up). 07/09/23  Yes [provider]  empagliflozin (JARDIANCE) 10 MG TABS tablet Take 1 tablet (10 mg total) by mouth daily. 10/05/23  Yes Leatha Gilding, MD  ferrous sulfate 325 (65 FE) MG tablet Take 325 mg by mouth 2 (two) times daily with a meal.   Yes [provider]  finasteride (PROSCAR) 5 MG tablet Take 5 mg by mouth daily. 07/09/23 07/08/24 Yes [provider]  folic acid (FOLVITE) 1 MG tablet Take 1 tablet (1 mg total) by mouth daily. 12/19/20  Yes Standley Brooking, MD  furosemide (LASIX) 20 MG tablet Take 20 mg by mouth daily.   Yes [provider]  Glucosamine 500 MG CAPS Take 500 mg by mouth daily.   Yes [provider]  irbesartan (AVAPRO) 75 MG tablet Take 1 tablet (75 mg total) by mouth daily. 10/05/23  Yes Leatha Gilding, MD  isosorbide mononitrate (IMDUR) 60 MG 24 hr tablet Take 1 tablet (60 mg total) by mouth daily. 10/05/23  Yes Leatha Gilding, MD  levothyroxine (SYNTHROID) 25 MCG tablet Take 25 mcg by mouth daily. 03/23/22  Yes [provider]  lidocaine (HM LIDOCAINE PATCH) 4 % Place 1 patch onto the skin daily. 02/18/23  Yes Small, Brooke L, PA  Multiple Vitamin (MULTIVITAMIN) capsule  Take 1 capsule by mouth daily.   Yes [provider]  nitroGLYCERIN (NITROSTAT) 0.4 MG SL tablet PLACE 1 TABLET UNDER THE  TONGUE EVERY 5 MINUTES AS NEEDED FOR CHEST PAIN. NOT TO EXCEED 3 DOSES 06/02/20  Yes Wendall Stade, MD  Omega-3 Fatty Acids (FISH OIL) 1000 MG CAPS Take 1,000 mg by mouth daily.   Yes [provider]  polyethylene glycol (MIRALAX) 17 g packet Take 17 g by mouth daily. 02/18/23  Yes Small, Brooke L, PA  ranolazine (RANEXA) 1000 MG SR tablet TAKE 1 TABLET TWICE A DAY Patient taking differently: Take 1,000 mg by mouth 2 (two) times daily. 04/10/22  Yes Wendall Stade, MD  sertraline (ZOLOFT) 25 MG tablet Take 25 mg by mouth daily.   Yes [provider]  simvastatin (ZOCOR) 20 MG tablet TAKE 1 TABLET DAILY Patient taking differently: Take 20 mg by mouth daily. 11/21/22  Yes Wendall Stade, MD  vitamin C (ASCORBIC ACID) 500 MG tablet Take 500 mg by mouth daily.   Yes [provider]  zinc sulfate 220 (50 Zn) MG capsule Take 1 capsule (220 mg total) by mouth daily. 12/19/20  Yes Standley Brooking, MD     Critical care time: 60 min     CRITICAL CARE Performed by: Lanier Clam   Total critical care time: 60 minutes (including family talk documented in IPAL)  Critical care time was exclusive of separately billable procedures and treating other patients. Critical care was necessary to treat or prevent imminent or life-threatening deterioration.  Critical care was time spent personally by me on the following activities: development of treatment plan with patient and/or surrogate as well as nursing, discussions with consultants, evaluation of patient's response to treatment, examination of patient, obtaining history from patient or surrogate, ordering and performing treatments and interventions, ordering and review of laboratory studies, ordering and review of radiographic studies, pulse oximetry and re-evaluation of patient's condition.    Tessie Fass MSN, AGACNP-BC [facility] Pulmonary/Critical Care Medicine Amion for pager  10/11/2023, 4:37 PM

## 2023-10-11 NOTE — IPAL (Signed)
  Interdisciplinary Goals of Care Family Meeting   Date carried out: 10/11/2023  Location of the meeting: Bedside  Member's involved: Nurse Practitioner, Family Member or next of kin, and Other: EMT at various points   Durable Power of Attorney or Environmental health practitioner: adult children     Discussion: We discussed goals of care for [name] .  Family recognizes that he has had a progressive decline. Son and daughter are at bedside. They feel like his body has been slowly shutting down & I agree with this.  We talked about GOC from here -- recently another sibling had reverted code status and he is currently on pressors. We talked about measures like CVC arterial line, consulting cardiology for pacemaker etc. They know definitively that he would NOT ever want a pacemaker, and they do not like the idea of ICU level procedures. They just want him to be comfortable  We talked instead about what a comfort focussed path entails. They would like to pursue comfort care, associated DNR. With his current BP, I would expect likely in-hospital death and would recommend hospitalist admission for EOL care. If stabilizes certainly could consider residential hospice.  We then discussed this on the phone with [name] who had previously reverted code status. In talking through his progressive decline, she agrees that comfort care is appropriate. We talked in detail about this and all parties are in agreement.    Will place orders for comfort care, consult to follow. Rec [facility] admission.   Code status:   Code Status: Do not attempt resuscitation (DNR) - Comfort care   Disposition: In-patient comfort care  Time spent for the meeting: 45 min    Tessie Fass MSN, AGACNP-BC [facility] Pulmonary/Critical Care Medicine Amion for pager  10/11/2023, 4:13 PM   10/11/2023, 4:09 PM

## 2023-10-11 NOTE — ED Notes (Signed)
Trauma Response Nurse Documentation   DEEP BONAWITZ is a 89 y.o. male arriving to [facility] ED via [facility]  On Plavix Trauma was activated as a Level 2 by Consulting civil engineer based on the following trauma criteria Elderly patients > 65 with head trauma on anti-coagulation (excluding ASA).  Patient cleared for CT by Dr. Maple Hudson. Pt transported to CT with trauma response nurse present to monitor. RN remained with the patient throughout their absence from the department for clinical observation.   GCS 14.   History   Past Medical History:  Diagnosis Date   Arthritis    AV BLOCK, 1ST DEGREE    Carotid artery disease (HCC)    a. s/p Left ECA followed by [name]   Coronary artery disease    Coronary artery disease involving coronary bypass graft of native heart with angina pectoris (HCC)    a. LHC 12/2015  3vd Left Cx 100% with colaterals, and distally LAD occluded, DES to prox-mid RCA  b. 02/2017: repeat cath with patent RCA stent and stable CAD; 02/27/2017: Rota-PCI to Ost RI   GERD (gastroesophageal reflux disease)    HLD (hyperlipidemia)    HTN (hypertension)    Hypothyroidism    Myocardial infarction (HCC)    20 years   NSVT (nonsustained ventricular tachycardia) (HCC)    during stress test 12/2015   Presence of drug coated stent in RCA & Ramus Intermedius. 02/28/2017   12/2015: PCI p-mRCA - Stent Resolute Integ 3.5x34 02/2017: Rota-PCI o-mRamus - overlapping DES STENT SYNERGY DES 3X32  & 3x24 (tapered post-dilation)   Stroke [facility])      Past Surgical History:  Procedure Laterality Date   CARDIAC CATHETERIZATION     x2   CARDIAC CATHETERIZATION N/A 01/11/2016   Procedure: Left Heart Cath and Coronary Angiography;  Surgeon: Iran Ouch, MD;  Location: [facility] INVASIVE CV LAB;  Service: Cardiovascular;  Laterality: N/A;   CAROTID ENDARTERECTOMY  11/28/2006   left   CATARACT EXTRACTION W/ INTRAOCULAR LENS IMPLANT Bilateral    CORONARY ATHERECTOMY N/A 02/27/2017   Procedure: Coronary  Atherectomy;  Surgeon: Lennette Bihari, MD;  Location: [facility] INVASIVE CV LAB;  Service: Cardiovascular;  Laterality: N/A;   HERNIA REPAIR     LEFT HEART CATH AND CORONARY ANGIOGRAPHY N/A 02/21/2017   Procedure: Left Heart Cath and Coronary Angiography;  Surgeon: Runell Gess, MD;  Location: [facility] INVASIVE CV LAB;  Service: Cardiovascular;  Laterality: N/A;   TONSILLECTOMY     TOTAL HIP ARTHROPLASTY Left 11/13/2022   Procedure: LEFT TOTAL HIP ARTHROPLASTY ANTERIOR APPROACH;  Surgeon: Kathryne Hitch, MD;  Location: [facility] OR;  Service: Orthopedics;  Laterality: Left;       Initial Focused Assessment (If applicable, or please see trauma documentation):   CT's Completed:   CT Head and CT C-Spine   Interventions:  Labs CT  Xrays Family presence  Plan for disposition:  Admission to floor   CCM consult Medicine consult Event Summary: PT to ED via [facility] from [facility]-- staff were attempting PT with pt and he collapsed, did not fall directly to floor, staff "sort of lowered" him down-- they did state he hit his head, EMS found pt to be bradycardic in the 30s, and hypotensive. He received 700cc NS per EMS On arrival pt is responsive, will answer questions, but is slow to respond. Monitor-- AFib in the 30s-- Dr. Maple Hudson was at bedside when pt arrived. FAST completed, orders given. This TRN transported pt to  and from CT without change in status. Hand off to [place], EMT-P.   Addendum - pt's CT scans negative, primary EMT -P made this TRN aware that pt's BP was in the 60s, P- 20s, and was hypothermic. Levo was started and CCM was contacted.   Lesle Chris Viveka Wilmeth  Trauma Response RN  Please call TRN at [phone] for further assistance.

## 2023-10-11 NOTE — Assessment & Plan Note (Signed)
Combination of bradycardia and hypotension.

## 2023-10-11 NOTE — ED Triage Notes (Signed)
To ED via [facility]- syncopal episode at [facility] while attempting PT , lowered to the floor- hit back of head,  Pt's BP was in the 90s-100s- with a pulse at 30  See trauma notes

## 2023-10-12 MED ORDER — ORAL CARE MOUTH RINSE: 15.0000 mL | OROMUCOSAL | Status: DC | PRN

## 2023-10-14 ENCOUNTER — Encounter (INDEPENDENT_AMBULATORY_CARE_PROVIDER_SITE_OTHER): Payer: Medicare Other | Admitting: Ophthalmology

## 2023-10-14 NOTE — Progress Notes (Signed)
 Patient fell and hit his head and is on plavix

## 2023-10-19 NOTE — Plan of Care (Signed)
  Problem: Education: Goal: Knowledge of General Education information will improve Description: Including pain rating scale, medication(s)/side effects and non-pharmacologic comfort measures Outcome: Not Applicable   Problem: Health Behavior/Discharge Planning: Goal: Ability to manage health-related needs will improve Outcome: Not Applicable   Problem: Clinical Measurements: Goal: Ability to maintain clinical measurements within normal limits will improve Outcome: Not Applicable Goal: Will remain free from infection Outcome: Not Applicable Goal: Diagnostic test results will improve Outcome: Not Applicable Goal: Respiratory complications will improve Outcome: Not Applicable Goal: Cardiovascular complication will be avoided Outcome: Not Applicable   Problem: Activity: Goal: Risk for activity intolerance will decrease Outcome: Not Applicable   Problem: Nutrition: Goal: Adequate nutrition will be maintained Outcome: Not Applicable   Problem: Coping: Goal: Level of anxiety will decrease Outcome: Not Applicable   Problem: Elimination: Goal: Will not experience complications related to bowel motility Outcome: Not Applicable Goal: Will not experience complications related to urinary retention Outcome: Not Applicable   Problem: Pain Managment: Goal: General experience of comfort will improve and/or be controlled Outcome: Not Applicable   Problem: Safety: Goal: Ability to remain free from injury will improve Outcome: Not Applicable   Problem: Skin Integrity: Goal: Risk for impaired skin integrity will decrease Outcome: Not Applicable

## 2023-10-19 NOTE — Death Summary Note (Addendum)
 DEATH SUMMARY   Patient Details  Name: [name] MRN: [id number] DOB: 1928-07-12 [id number], Tomi Bamberger, MD  Admission/Discharge Information   Admit Date:  2023/11/05  Date of Death: Date of Death: 11/06/23  Time of Death: Time of Death: 0900  Length of Stay: 1   Principle Cause of death: Severe bradycardia  Hospital Diagnoses: Principal Problem:   Syncope and collapse Active Problems:   DNR (do not resuscitate)   Acute kidney injury superimposed on chronic kidney disease (HCC)   Goals of care, counseling/discussion   Hospital Course: MAZEN MARCIN is a 89 y.o. male with past medical history of bradycardia, congestive heart failure, CKD, CAD, A-fib, anemia, hypothyroidism presented to hospital after a syncopal episode but did not hit his head.  As per the family patient has been slowly declining with heart failure.  In the ED patient was noted to be hypothermic, hypotensive and bradycardic.  Initial labs showed bicarb of 19, creatinine of 2.6 LFTs mild elevation with lactate of 1.8.  CBC showed hemoglobin of 8.9 with WBC of 2.7.  TSH was 7.1.  Initially patient was admitted to ICU service and started on Levophed drip but subsequently after discussing with the family plan was to proceed with comfort measures.    Following conditions were addressed during hospitalization,   Syncope and collapse Secondary to combination of bradycardia and hypotension.  Proceed with comfort care.    DNR (do not resuscitate) Code status DNR and DNI.    Acute kidney injury superimposed on chronic kidney disease IIIa (HCC) Due to hypoperfusion from bradycardia and heart failure.  Latest creatinine of 2.6.  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  Elevated TSH at 7.1.  History of congestive heart failure. Has been worsening as per the family.  History of atrial fibrillation with slow ventricular response.  Not on anticoagulation due to frequent falls.  Dementia.  Advanced age and  frailty.  Plan was  to proceed with comfort care.   Goals of care, counseling/discussion Due to pt's guarded condition his prognosis is guarded and terminal due to multiple comorbidity and bradycardia and acute kidney injury. family had decided to proceed with comfort measures.    Procedures: None   Consultations: PCCM  The results of significant diagnostics from this hospitalization (including imaging, microbiology, ancillary and laboratory) are listed below for reference.   Significant Diagnostic Studies: DG Pelvis Portable Result Date: 2023/11/05 CLINICAL DATA:  Trauma. Fall. Patient was lowered to the floor. Trauma to back of head. EXAM: PORTABLE PELVIS 1-2 VIEWS COMPARISON:  One-view pelvis 10/07/2023 FINDINGS: Left total hip arthroplasty is again noted. The hips are located bilaterally. No acute fractures are present. Extensive vascular calcifications are noted. IMPRESSION: 1. No acute fracture or dislocation. 2. Left total hip arthroplasty. Electronically Signed   By: Marin Roberts M.D.   On: 11/05/23 15:04   DG Chest Port 1 View Result Date: 05-Nov-2023 CLINICAL DATA:  Trauma. Mechanical fall. Patient was lowered to the floor. Trauma to back of head. EXAM: PORTABLE CHEST 1 VIEW COMPARISON:  One-view chest x-ray 10/07/2023 FINDINGS: Heart is enlarged. Atherosclerotic calcifications are present at the aortic arch. No edema or effusion present. No focal airspace disease is present. The visualized soft tissues and bony thorax are unremarkable. IMPRESSION: 1. Cardiomegaly without failure. 2. No acute cardiopulmonary disease. Electronically Signed   By: Marin Roberts M.D.   On: Nov 05, 2023 15:03   CT HEAD WO CONTRAST Result Date: 2023-11-05 CLINICAL DATA:  Head trauma, moderate-severe; Polytrauma, blunt.  Syncopal episode during physical therapy. EXAM: CT HEAD WITHOUT CONTRAST CT CERVICAL SPINE WITHOUT CONTRAST TECHNIQUE: Multidetector CT imaging of the head and cervical spine was  performed following the standard protocol without intravenous contrast. Multiplanar CT image reconstructions of the cervical spine were also generated. RADIATION DOSE REDUCTION: This exam was performed according to the departmental dose-optimization program which includes automated exposure control, adjustment of the mA and/or kV according to patient size and/or use of iterative reconstruction technique. COMPARISON:  CT head and cervical spine 10/07/2023. FINDINGS: CT HEAD FINDINGS Brain: No acute hemorrhage. 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 Cortical gray-white differentiation is otherwise preserved. Prominence of the ventricles and sulci within expected range for age. No hydrocephalus or extra-axial collection. No mass effect or midline shift. Vascular: No hyperdense vessel or unexpected calcification. Skull: No calvarial fracture or suspicious bone lesion. Skull base is unremarkable. Sinuses/Orbits: No acute finding. Other: None. CT CERVICAL SPINE FINDINGS Alignment: Unchanged degenerative reversal of the normal cervical lordosis. No traumatic malalignment. Skull base and vertebrae: No acute fracture. Normal craniocervical junction. No suspicious bone lesions. Unchanged severe disc height loss throughout the cervical spine with multilevel degenerative endplate changes. Soft tissues and spinal canal: No prevertebral fluid or swelling. No visible canal hematoma. Disc levels: Unchanged multilevel cervical spondylosis without high-grade spinal canal stenosis. Upper chest: Small right pleural effusion. Other: Extensive atherosclerotic calcifications of the right carotid bulb. Surgical clips from prior left carotid endarterectomy. IMPRESSION: 1. No acute intracranial abnormality. 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 2. No acute cervical spine  fracture or traumatic malalignment. 3. Small right pleural effusion. Electronically Signed   By: Orvan Falconer M.D.   On: 10/11/2023 14:15   CT CERVICAL SPINE WO CONTRAST Result Date: 10/11/2023 CLINICAL DATA:  Head trauma, moderate-severe; Polytrauma, blunt. Syncopal episode during physical therapy. EXAM: CT HEAD WITHOUT CONTRAST CT CERVICAL SPINE WITHOUT CONTRAST TECHNIQUE: Multidetector CT imaging of the head and cervical spine was performed following the standard protocol without intravenous contrast. Multiplanar CT image reconstructions of the cervical spine were also generated. RADIATION DOSE REDUCTION: This exam was performed according to the departmental dose-optimization program which includes automated exposure control, adjustment of the mA and/or kV according to patient size and/or use of iterative reconstruction technique. COMPARISON:  CT head and cervical spine 10/07/2023. FINDINGS: CT HEAD FINDINGS Brain: No acute hemorrhage. 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 Cortical gray-white differentiation is otherwise preserved. Prominence of the ventricles and sulci within expected range for age. No hydrocephalus or extra-axial collection. No mass effect or midline shift. Vascular: No hyperdense vessel or unexpected calcification. Skull: No calvarial fracture or suspicious bone lesion. Skull base is unremarkable. Sinuses/Orbits: No acute finding. Other: None. CT CERVICAL SPINE FINDINGS Alignment: Unchanged degenerative reversal of the normal cervical lordosis. No traumatic malalignment. Skull base and vertebrae: No acute fracture. Normal craniocervical junction. No suspicious bone lesions. Unchanged severe disc height loss throughout the cervical spine with multilevel degenerative endplate changes. Soft tissues and spinal canal: No prevertebral fluid or swelling. No visible canal hematoma. Disc levels: Unchanged  multilevel cervical spondylosis without high-grade spinal canal stenosis. Upper chest: Small right pleural effusion. Other: Extensive atherosclerotic calcifications of the right carotid bulb. Surgical clips from prior left carotid endarterectomy. IMPRESSION: 1. No acute intracranial abnormality. Stable background of moderate chronic small-vessel disease and old cortical  infarct along the left middle frontal gyrus. 2. No acute cervical spine fracture or traumatic malalignment. 3. Small right pleural effusion. Electronically Signed   By: Orvan Falconer M.D.   On: 10/11/2023 14:15   DG Pelvis Portable Result Date: 10/07/2023 CLINICAL DATA:  Trauma, fall.  Pain. EXAM: PORTABLE PELVIS 1-2 VIEWS COMPARISON:  03/11/2023 FINDINGS: The cortical margins of the bony pelvis are intact. No fracture. Left hip arthroplasty which is intact. Pubic symphysis and sacroiliac joints are congruent. No hip arthroplasty dislocation. Mild right hip osteoarthritis. Vascular calcifications. IMPRESSION: No pelvic fracture.  Intact left hip arthroplasty. Electronically Signed   By: Narda Rutherford M.D.   On: 10/07/2023 21:21   DG Chest Port 1 View Result Date: 10/07/2023 CLINICAL DATA:  Trauma, fall. EXAM: PORTABLE CHEST 1 VIEW COMPARISON:  10/04/2023, CT 10/01/2023 FINDINGS: Chronic cardiomegaly. Unchanged mediastinal contours. Aortic atherosclerosis. Improved pleural effusions. No pneumothorax. No acute airspace disease. No displaced rib fracture or acute osseous findings. IMPRESSION: 1. No acute traumatic findings. 2. Chronic cardiomegaly. Improved pleural effusions. Electronically Signed   By: Narda Rutherford M.D.   On: 10/07/2023 21:19   CT HEAD WO CONTRAST ( ) Result Date: 10/07/2023 CLINICAL DATA:  Head trauma EXAM: CT HEAD WITHOUT CONTRAST CT CERVICAL SPINE WITHOUT CONTRAST TECHNIQUE: Multidetector CT imaging of the head and cervical spine was performed following the standard protocol without intravenous contrast.  Multiplanar CT image reconstructions of the cervical spine were also generated. RADIATION DOSE REDUCTION: This exam was performed according to the departmental dose-optimization program which includes automated exposure control, adjustment of the mA and/or kV according to patient size and/or use of iterative reconstruction technique. COMPARISON:  None Available. FINDINGS: CT HEAD FINDINGS Brain: There is no mass, hemorrhage or extra-axial collection. There is generalized atrophy without lobar predilection. Hypodensity of the white matter is most commonly associated with chronic microvascular disease. Vascular: Atherosclerotic calcification of the internal carotid arteries at the skull base. No abnormal hyperdensity of the major intracranial arteries or dural venous sinuses. Skull: The visualized skull base, calvarium and extracranial soft tissues are normal. Sinuses/Orbits: No fluid levels or advanced mucosal thickening of the visualized paranasal sinuses. No mastoid or middle ear effusion. Normal orbits. Other: None. CT CERVICAL SPINE FINDINGS Alignment: Grade 1 retrolisthesis at C3-4 and C4-5. Skull base and vertebrae: No acute fracture. Soft tissues and spinal canal: No prevertebral fluid or swelling. No visible canal hematoma. Disc levels: No advanced spinal canal or neural foraminal stenosis. Upper chest: No pneumothorax, pulmonary nodule or pleural effusion. Other: Aortic and carotid atherosclerosis. IMPRESSION: 1. No acute intracranial abnormality. 2. No acute fracture or traumatic subluxation of the cervical spine. Electronically Signed   By: Deatra Robinson M.D.   On: 10/07/2023 21:15   CT Cervical Spine Wo Contrast Result Date: 10/07/2023 CLINICAL DATA:  Head trauma EXAM: CT HEAD WITHOUT CONTRAST CT CERVICAL SPINE WITHOUT CONTRAST TECHNIQUE: Multidetector CT imaging of the head and cervical spine was performed following the standard protocol without intravenous contrast. Multiplanar CT image  reconstructions of the cervical spine were also generated. RADIATION DOSE REDUCTION: This exam was performed according to the departmental dose-optimization program which includes automated exposure control, adjustment of the mA and/or kV according to patient size and/or use of iterative reconstruction technique. COMPARISON:  None Available. FINDINGS: CT HEAD FINDINGS Brain: There is no mass, hemorrhage or extra-axial collection. There is generalized atrophy without lobar predilection. Hypodensity of the white matter is most commonly associated with chronic microvascular disease. Vascular: Atherosclerotic calcification of the internal carotid  arteries at the skull base. No abnormal hyperdensity of the major intracranial arteries or dural venous sinuses. Skull: The visualized skull base, calvarium and extracranial soft tissues are normal. Sinuses/Orbits: No fluid levels or advanced mucosal thickening of the visualized paranasal sinuses. No mastoid or middle ear effusion. Normal orbits. Other: None. CT CERVICAL SPINE FINDINGS Alignment: Grade 1 retrolisthesis at C3-4 and C4-5. Skull base and vertebrae: No acute fracture. Soft tissues and spinal canal: No prevertebral fluid or swelling. No visible canal hematoma. Disc levels: No advanced spinal canal or neural foraminal stenosis. Upper chest: No pneumothorax, pulmonary nodule or pleural effusion. Other: Aortic and carotid atherosclerosis. IMPRESSION: 1. No acute intracranial abnormality. 2. No acute fracture or traumatic subluxation of the cervical spine. Electronically Signed   By: Deatra Robinson M.D.   On: 10/07/2023 21:15   DG CHEST PORT 1 VIEW Result Date: 10/04/2023 CLINICAL DATA:  [id number] Dyspnea on exertion [id number] EXAM: PORTABLE CHEST 1 VIEW COMPARISON:  10/01/2023 chest radiograph. FINDINGS: Right rotated chest radiograph. Stable cardiomediastinal silhouette with moderate cardiomegaly. No pneumothorax. Trace right pleural effusion, decreased. No left pleural  effusion. No overt pulmonary edema. No consolidative airspace disease. IMPRESSION: 1. Moderate cardiomegaly. No overt pulmonary edema. 2. Trace right pleural effusion, decreased. Electronically Signed   By: Delbert Phenix M.D.   On: 10/04/2023 12:21   ECHOCARDIOGRAM COMPLETE Result Date: 10/01/2023    ECHOCARDIOGRAM REPORT   Patient Name:   BEXLEY MCLESTER Date of Exam: 10/01/2023 Medical Rec #:  [id number]    Height:       66.0 in Accession #:    [id number]   Weight:       146.4 lb Date of Birth:  [birth year]    BSA:          1.751 m Patient Age:    89 years     BP:           138/73 mmHg Patient Gender: M            HR:           42 bpm. Exam Location:  Inpatient Procedure: 2D Echo, Cardiac Doppler and Color Doppler Indications:    Congestive Heart Failure I50.9  History:        Patient has prior history of Echocardiogram examinations, most                 recent 01/12/2016. Previous Myocardial Infarction,                 Arrythmias:Atrial Fibrillation, Signs/Symptoms:Chest Pain; Risk                 Factors:Hypertension.  Sonographer:    Webb Laws Referring Phys: [id number] KATY L FOUST IMPRESSIONS  1. Left ventricular ejection fraction, by estimation, is 55 to 60%. The left ventricle has normal function. The left ventricle has no regional wall motion abnormalities. There is mild concentric left ventricular hypertrophy. Left ventricular diastolic function could not be evaluated.  2. Right ventricular systolic function is moderately reduced. The right ventricular size is moderately enlarged. There is moderately elevated pulmonary artery systolic pressure. The estimated right ventricular systolic pressure is 56.7 mmHg.  3. Left atrial size was severely dilated.  4. Right atrial size was severely dilated.  5. The mitral valve is degenerative. Severe mitral valve regurgitation. No evidence of mitral stenosis.  6. The aortic valve is tricuspid. There is moderate calcification of the aortic valve. There is moderate  thickening of the aortic valve. Aortic valve  regurgitation is mild. Aortic valve sclerosis/calcification is present, without any evidence of aortic stenosis.  7. The inferior vena cava is dilated in size with <50% respiratory variability, suggesting right atrial pressure of 15 mmHg. FINDINGS  Left Ventricle: Left ventricular ejection fraction, by estimation, is 55 to 60%. The left ventricle has normal function. The left ventricle has no regional wall motion abnormalities. The left ventricular internal cavity size was normal in size. There is  mild concentric left ventricular hypertrophy. Left ventricular diastolic function could not be evaluated due to atrial fibrillation. Left ventricular diastolic function could not be evaluated. Right Ventricle: The right ventricular size is moderately enlarged. No increase in right ventricular wall thickness. Right ventricular systolic function is moderately reduced. There is moderately elevated pulmonary artery systolic pressure. The tricuspid  regurgitant velocity is 3.23 m/s, and with an assumed right atrial pressure of 15 mmHg, the estimated right ventricular systolic pressure is 56.7 mmHg. Left Atrium: Left atrial size was severely dilated. Right Atrium: Right atrial size was severely dilated. Pericardium: There is no evidence of pericardial effusion. Mitral Valve: The mitral valve is degenerative in appearance. Severe mitral valve regurgitation. No evidence of mitral valve stenosis. Tricuspid Valve: The tricuspid valve is grossly normal. Tricuspid valve regurgitation is mild . No evidence of tricuspid stenosis. Aortic Valve: The aortic valve is tricuspid. There is moderate calcification of the aortic valve. There is moderate thickening of the aortic valve. Aortic valve regurgitation is mild. Aortic regurgitation PHT measures 678 msec. Aortic valve sclerosis/calcification is present, without any evidence of aortic stenosis. Aortic valve peak gradient measures 10.9 mmHg.  Pulmonic Valve: The pulmonic valve was grossly normal. Pulmonic valve regurgitation is trivial. No evidence of pulmonic stenosis. Aorta: The aortic root is normal in size and structure. Venous: The inferior vena cava is dilated in size with less than 50% respiratory variability, suggesting right atrial pressure of 15 mmHg. IAS/Shunts: The atrial septum is grossly normal.  LEFT VENTRICLE PLAX 2D LVIDd:         5.40 cm     Diastology LVIDs:         2.50 cm     LV e' medial:    3.81 cm/s LV PW:         1.45 cm     LV E/e' medial:  28.9 LV IVS:        1.15 cm     LV e' lateral:   5.44 cm/s LVOT diam:     2.20 cm     LV E/e' lateral: 20.2 LV SV:         92 LV SV Index:   53 LVOT Area:     3.80 cm  LV Volumes (MOD) LV vol d, MOD A4C: 91.4 ml LV vol s, MOD A4C: 35.6 ml LV SV MOD A4C:     91.4 ml RIGHT VENTRICLE            IVC RV Basal diam:  4.40 cm    IVC diam: 2.40 cm RV S prime:     8.16 cm/s TAPSE (M-mode): 1.9 cm LEFT ATRIUM              Index         RIGHT ATRIUM           Index LA diam:        5.85 cm  3.34 cm/m    RA Area:     29.20 cm LA Vol (A2C):   102.0 ml 58.24 ml/m  RA Volume:   88.40 ml  50.47 ml/m LA Vol (A4C):   184.0 ml 105.06 ml/m LA Biplane Vol: 139.0 ml 79.36 ml/m  AORTIC VALVE AV Area (Vmax): 2.33 cm AV Vmax:        165.00 cm/s AV Peak Grad:   10.9 mmHg LVOT Vmax:      101.00 cm/s LVOT Vmean:     67.400 cm/s LVOT VTI:       0.243 m AI PHT:         678 msec  AORTA Ao Root diam: 3.70 cm MITRAL VALVE                  TRICUSPID VALVE MV Area (PHT): 4.49 cm       TR Peak grad:   41.7 mmHg MV Decel Time: 169 msec       TR Vmax:        323.00 cm/s MR Peak grad:    158.3 mmHg MR Mean grad:    87.5 mmHg    SHUNTS MR Vmax:         629.00 cm/s  Systemic VTI:  0.24 m MR Vmean:        429.5 cm/s   Systemic Diam: 2.20 cm MR PISA:         2.26 cm MR PISA Eff ROA: 12 mm MR PISA Radius:  0.60 cm MV E velocity: 110.00 cm/s MV A velocity: 61.20 cm/s MV E/A ratio:  1.80 Lennie Odor MD Electronically signed  by Lennie Odor MD Signature Date/Time: 10/01/2023/3:06:28 PM    Final    CT Angio Chest PE W/Cm &/Or Wo Cm Result Date: 10/01/2023 CLINICAL DATA:  Weakness for 1 week. Stopped taking meds. Positive D-dimer. PE suspected. EXAM: CT ANGIOGRAPHY CHEST WITH CONTRAST TECHNIQUE: Multidetector CT imaging of the chest was performed using the standard protocol during bolus administration of intravenous contrast. Multiplanar CT image reconstructions and MIPs were obtained to evaluate the vascular anatomy. RADIATION DOSE REDUCTION: This exam was performed according to the departmental dose-optimization program which includes automated exposure control, adjustment of the mA and/or kV according to patient size and/or use of iterative reconstruction technique. CONTRAST:  75mL OMNIPAQUE IOHEXOL 350 MG/ML SOLN COMPARISON:  Same day chest radiograph and CTA 02/18/2023 FINDINGS: Cardiovascular: Marked cardiomegaly. No pericardial effusion. No pulmonary embolism. Dilated main pulmonary artery measuring 39 mm. Dilated ascending aorta measuring 49 mm in diameter. This has increased from 02/18/2023 when it measured 47 mm using similar measuring technique. Evaluation for dissection is limited due to contrast timing. Reflux of contrast into the IVC and hepatic veins compatible with elevated right heart pressures. Advanced coronary artery and aortic atherosclerotic calcification. Mediastinum/Nodes: Trachea and esophagus are unremarkable. No thoracic adenopathy. Lungs/Pleura: Large right and small left pleural effusions. Patchy ground-glass opacities bilaterally. Compressive atelectasis in the lower lobes, right middle lobe, and lingula. No pneumothorax. Upper Abdomen: No acute abnormality. Musculoskeletal: No acute fracture. Review of the MIP images confirms the above findings. IMPRESSION: 1. No pulmonary embolism. 2. Large right and small left pleural effusions with compressive atelectasis. 3. Patchy ground-glass opacities bilaterally,  likely edema. Atypical infection considered less likely though not excluded. 4. Marked cardiomegaly with dilated main pulmonary artery and reflux of contrast into the IVC and hepatic veins compatible with elevated right heart pressures and pulmonary arterial hypertension. 5. Dilated ascending aorta measuring 49 mm in diameter. This has increased from 02/18/2023 when it measured 47 mm using similar measuring technique. Ascending thoracic aortic aneurysm. Recommend semi-annual imaging followup  by CTA or MRA and referral to cardiothoracic surgery if not already obtained. This recommendation follows 2010 ACCF/AHA/AATS/ACR/ASA/SCA/SCAI/SIR/STS/SVM Guidelines for the Diagnosis and Management of Patients With Thoracic Aortic Disease. Circulation. 2010; 121: Z610-R604. Aortic aneurysm NOS (ICD10-I71.9) 6. Aortic Atherosclerosis (ICD10-I70.0). Electronically Signed   By: Minerva Fester M.D.   On: 10/01/2023 02:53   DG Chest Port 1 View Result Date: 10/01/2023 CLINICAL DATA:  Shortness of breath and weakness EXAM: PORTABLE CHEST 1 VIEW COMPARISON:  03/11/2023 radiographs FINDINGS: Cardiomegaly. Coronary stenting. Aortic atherosclerotic calcification. Layering bilateral pleural effusions and associated airspace opacities. No pneumothorax. No displaced rib fractures. IMPRESSION: Layering bilateral pleural effusions and associated airspace opacities. Cardiomegaly. Electronically Signed   By: Minerva Fester M.D.   On: 10/01/2023 01:25   CT Head Wo Contrast Result Date: 09/30/2023 CLINICAL DATA:  Neuro deficit, acute, stroke suspected.  Weakness. EXAM: CT HEAD WITHOUT CONTRAST TECHNIQUE: Contiguous axial images were obtained from the base of the skull through the vertex without intravenous contrast. RADIATION DOSE REDUCTION: This exam was performed according to the departmental dose-optimization program which includes automated exposure control, adjustment of the mA and/or kV according to patient size and/or use of  iterative reconstruction technique. COMPARISON:  Head CT 03/11/2023 FINDINGS: Brain: There is no evidence of an acute infarct, intracranial hemorrhage, mass, midline shift, or extra-axial fluid collection. There is moderate cerebral atrophy. Cerebral white matter hypodensities are unchanged and nonspecific but compatible with mild-to-moderate chronic small vessel ischemic disease. 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 Vascular: Calcified atherosclerosis at the skull base. No hyperdense vessel. Skull: No acute fracture or suspicious osseous lesion. Sinuses/Orbits: Visualized paranasal sinuses and mastoid air cells are clear. Bilateral cataract extraction. Other: None. IMPRESSION: 1. No evidence of acute intracranial abnormality. 2. Cerebral atrophy and chronic small vessel ischemic disease. Electronically Signed   By: Sebastian Ache M.D.   On: 09/30/2023 16:32    Microbiology: No results found for this or any previous visit (from the past 240 hours).  Time spent: 39 minutes  Signed: Joycelyn Das, MD 10/28/2023

## 2023-10-19 DEATH — deceased

## 2024-09-21 ENCOUNTER — Encounter (INDEPENDENT_AMBULATORY_CARE_PROVIDER_SITE_OTHER): Payer: Medicare Other | Admitting: Ophthalmology
